# Patient Record
Sex: Female | Born: 1965 | Race: White | Hispanic: No | Marital: Married | Smoking: Current every day smoker
Health system: Southern US, Community
[De-identification: ages and names within clinical notes are randomized; demographics above are authoritative.]

## PROBLEM LIST (undated history)

## (undated) DIAGNOSIS — E119 Type 2 diabetes mellitus without complications: Secondary | ICD-10-CM

## (undated) DIAGNOSIS — D649 Anemia, unspecified: Secondary | ICD-10-CM

## (undated) DIAGNOSIS — Q644 Malformation of urachus: Secondary | ICD-10-CM

## (undated) DIAGNOSIS — E669 Obesity, unspecified: Secondary | ICD-10-CM

## (undated) DIAGNOSIS — M8580 Other specified disorders of bone density and structure, unspecified site: Secondary | ICD-10-CM

## (undated) DIAGNOSIS — G4730 Sleep apnea, unspecified: Secondary | ICD-10-CM

## (undated) DIAGNOSIS — K8020 Calculus of gallbladder without cholecystitis without obstruction: Secondary | ICD-10-CM

## (undated) DIAGNOSIS — I10 Essential (primary) hypertension: Secondary | ICD-10-CM

## (undated) DIAGNOSIS — N200 Calculus of kidney: Secondary | ICD-10-CM

## (undated) DIAGNOSIS — K219 Gastro-esophageal reflux disease without esophagitis: Secondary | ICD-10-CM

## (undated) DIAGNOSIS — F419 Anxiety disorder, unspecified: Secondary | ICD-10-CM

## (undated) DIAGNOSIS — E538 Deficiency of other specified B group vitamins: Secondary | ICD-10-CM

## (undated) HISTORY — DX: Calculus of kidney: N20.0

## (undated) HISTORY — DX: Malformation of urachus: Q64.4

## (undated) HISTORY — DX: Other specified disorders of bone density and structure, unspecified site: M85.80

## (undated) HISTORY — DX: Gastro-esophageal reflux disease without esophagitis: K21.9

## (undated) HISTORY — DX: Anemia, unspecified: D64.9

## (undated) HISTORY — DX: Sleep apnea, unspecified: G47.30

## (undated) HISTORY — DX: Type 2 diabetes mellitus without complications: E11.9

## (undated) HISTORY — DX: Essential (primary) hypertension: I10

## (undated) HISTORY — DX: Anxiety disorder, unspecified: F41.9

## (undated) HISTORY — DX: Deficiency of other specified B group vitamins: E53.8

## (undated) HISTORY — DX: Obesity, unspecified: E66.9

---
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
## 1990-09-09 HISTORY — PX: REDUCTION MAMMAPLASTY: SUR839

## 1999-04-23 ENCOUNTER — Ambulatory Visit (HOSPITAL_COMMUNITY): Admission: AD | Admit: 1999-04-23 | Discharge: 1999-04-23 | Payer: Self-pay | Admitting: *Deleted

## 1999-04-23 ENCOUNTER — Encounter (INDEPENDENT_AMBULATORY_CARE_PROVIDER_SITE_OTHER): Payer: Self-pay | Admitting: Specialist

## 1999-04-23 ENCOUNTER — Encounter: Payer: Self-pay | Admitting: *Deleted

## 1999-05-07 ENCOUNTER — Other Ambulatory Visit: Admission: RE | Admit: 1999-05-07 | Discharge: 1999-05-07 | Payer: Self-pay | Admitting: *Deleted

## 2000-05-10 DIAGNOSIS — Q644 Malformation of urachus: Secondary | ICD-10-CM

## 2000-05-10 HISTORY — DX: Malformation of urachus: Q64.4

## 2002-03-17 HISTORY — PX: GASTRIC BYPASS: SHX52

## 2002-06-17 ENCOUNTER — Encounter: Payer: Self-pay | Admitting: Internal Medicine

## 2003-05-03 ENCOUNTER — Encounter: Payer: Self-pay | Admitting: Internal Medicine

## 2003-05-03 ENCOUNTER — Other Ambulatory Visit: Admission: RE | Admit: 2003-05-03 | Discharge: 2003-05-03 | Payer: Self-pay | Admitting: Internal Medicine

## 2003-05-03 LAB — CONVERTED CEMR LAB: Pap Smear: NORMAL

## 2005-05-20 ENCOUNTER — Ambulatory Visit: Payer: Self-pay | Admitting: Internal Medicine

## 2005-05-24 ENCOUNTER — Ambulatory Visit: Payer: Self-pay | Admitting: Internal Medicine

## 2005-06-03 ENCOUNTER — Ambulatory Visit: Payer: Self-pay | Admitting: Internal Medicine

## 2005-06-05 ENCOUNTER — Ambulatory Visit: Payer: Self-pay | Admitting: Internal Medicine

## 2005-06-11 ENCOUNTER — Ambulatory Visit: Payer: Self-pay | Admitting: Internal Medicine

## 2005-06-21 ENCOUNTER — Ambulatory Visit: Payer: Self-pay | Admitting: Internal Medicine

## 2005-06-26 ENCOUNTER — Ambulatory Visit: Payer: Self-pay | Admitting: Family Medicine

## 2005-07-04 ENCOUNTER — Encounter: Payer: Self-pay | Admitting: Internal Medicine

## 2005-08-12 ENCOUNTER — Ambulatory Visit: Payer: Self-pay | Admitting: Internal Medicine

## 2005-11-18 ENCOUNTER — Ambulatory Visit: Payer: Self-pay | Admitting: Internal Medicine

## 2006-01-01 ENCOUNTER — Encounter: Payer: Self-pay | Admitting: Internal Medicine

## 2006-01-16 ENCOUNTER — Ambulatory Visit: Payer: Self-pay | Admitting: Internal Medicine

## 2006-03-20 ENCOUNTER — Ambulatory Visit: Payer: Self-pay | Admitting: Internal Medicine

## 2006-06-18 ENCOUNTER — Ambulatory Visit: Payer: Self-pay | Admitting: Internal Medicine

## 2006-12-19 ENCOUNTER — Encounter: Payer: Self-pay | Admitting: Internal Medicine

## 2006-12-19 ENCOUNTER — Ambulatory Visit: Payer: Self-pay | Admitting: Internal Medicine

## 2006-12-19 DIAGNOSIS — Z6827 Body mass index (BMI) 27.0-27.9, adult: Secondary | ICD-10-CM | POA: Insufficient documentation

## 2006-12-19 DIAGNOSIS — I10 Essential (primary) hypertension: Secondary | ICD-10-CM

## 2006-12-19 DIAGNOSIS — K219 Gastro-esophageal reflux disease without esophagitis: Secondary | ICD-10-CM

## 2006-12-19 DIAGNOSIS — E669 Obesity, unspecified: Secondary | ICD-10-CM | POA: Insufficient documentation

## 2006-12-19 DIAGNOSIS — J45909 Unspecified asthma, uncomplicated: Secondary | ICD-10-CM

## 2006-12-19 DIAGNOSIS — J4520 Mild intermittent asthma, uncomplicated: Secondary | ICD-10-CM | POA: Insufficient documentation

## 2006-12-19 DIAGNOSIS — D518 Other vitamin B12 deficiency anemias: Secondary | ICD-10-CM

## 2006-12-19 DIAGNOSIS — G2589 Other specified extrapyramidal and movement disorders: Secondary | ICD-10-CM | POA: Insufficient documentation

## 2006-12-19 DIAGNOSIS — E1159 Type 2 diabetes mellitus with other circulatory complications: Secondary | ICD-10-CM | POA: Insufficient documentation

## 2006-12-19 DIAGNOSIS — G4730 Sleep apnea, unspecified: Secondary | ICD-10-CM | POA: Insufficient documentation

## 2006-12-19 DIAGNOSIS — F411 Generalized anxiety disorder: Secondary | ICD-10-CM | POA: Insufficient documentation

## 2006-12-22 ENCOUNTER — Ambulatory Visit: Payer: Self-pay | Admitting: Internal Medicine

## 2006-12-22 LAB — CONVERTED CEMR LAB
Basophils Absolute: 0.1 10*3/uL (ref 0.0–0.1)
Basophils Relative: 1 % (ref 0.0–1.0)
Cholesterol: 193 mg/dL (ref 0–200)
Creatinine,U: 194.7 mg/dL
Eosinophils Absolute: 0.1 10*3/uL (ref 0.0–0.6)
Eosinophils Relative: 1.6 % (ref 0.0–5.0)
HCT: 36.9 % (ref 36.0–46.0)
HDL: 42.7 mg/dL (ref >39.0)
Hemoglobin: 12.4 g/dL (ref 12.0–15.0)
Hgb A1c MFr Bld: 6.1 %
Hgb A1c MFr Bld: 6.1 % — ABNORMAL HIGH (ref 4.6–6.0)
LDL Cholesterol: 130 mg/dL — ABNORMAL HIGH (ref 0–99)
Lymphocytes Relative: 24.2 % (ref 12.0–46.0)
MCHC: 33.7 g/dL (ref 30.0–36.0)
MCV: 84.6 fL (ref 78.0–100.0)
Microalb Creat Ratio: 16.9 mg/g (ref 0.0–30.0)
Microalb, Ur: 3.3 mg/dL — ABNORMAL HIGH (ref 0.0–1.9)
Monocytes Absolute: 0.4 10*3/uL (ref 0.2–0.7)
Monocytes Relative: 8.2 % (ref 3.0–11.0)
Neutro Abs: 3.5 10*3/uL (ref 1.4–7.7)
Neutrophils Relative %: 65 % (ref 43.0–77.0)
Platelets: 309 10*3/uL (ref 150–400)
RBC: 4.36 M/uL (ref 3.87–5.11)
RDW: 15.6 % — ABNORMAL HIGH (ref 11.5–14.6)
Total CHOL/HDL Ratio: 4.5
Triglycerides: 102 mg/dL (ref 0–149)
VLDL: 20 mg/dL (ref 0–40)
WBC: 5.4 10*3/uL (ref 4.5–10.5)

## 2007-06-02 ENCOUNTER — Encounter: Payer: Self-pay | Admitting: Internal Medicine

## 2007-06-02 DIAGNOSIS — R7303 Prediabetes: Secondary | ICD-10-CM | POA: Insufficient documentation

## 2007-06-02 DIAGNOSIS — E119 Type 2 diabetes mellitus without complications: Secondary | ICD-10-CM

## 2007-06-02 DIAGNOSIS — M8580 Other specified disorders of bone density and structure, unspecified site: Secondary | ICD-10-CM

## 2007-06-30 ENCOUNTER — Ambulatory Visit: Payer: Self-pay | Admitting: Internal Medicine

## 2007-07-01 LAB — CONVERTED CEMR LAB
Albumin: 3.8 g/dL (ref 3.5–5.2)
BUN: 9 mg/dL (ref 6–23)
Basophils Absolute: 0 10*3/uL (ref 0.0–0.1)
Basophils Relative: 0.6 % (ref 0.0–1.0)
CO2: 32 meq/L (ref 19–32)
Calcium: 8.9 mg/dL (ref 8.4–10.5)
Chloride: 102 meq/L (ref 96–112)
Creatinine, Ser: 0.6 mg/dL (ref 0.4–1.2)
Creatinine,U: 66.5 mg/dL
Eosinophils Absolute: 0.2 10*3/uL (ref 0.0–0.6)
Eosinophils Relative: 2.7 % (ref 0.0–5.0)
GFR calc Af Amer: 141 mL/min
GFR calc non Af Amer: 117 mL/min
Glucose, Bld: 89 mg/dL (ref 70–99)
HCT: 36.5 % (ref 36.0–46.0)
Hemoglobin: 12.2 g/dL (ref 12.0–15.0)
Hgb A1c MFr Bld: 6.5 % — ABNORMAL HIGH (ref 4.6–6.0)
Lymphocytes Relative: 35.8 % (ref 12.0–46.0)
MCHC: 33.5 g/dL (ref 30.0–36.0)
MCV: 86.8 fL (ref 78.0–100.0)
Microalb Creat Ratio: 36.1 mg/g — ABNORMAL HIGH (ref 0.0–30.0)
Microalb, Ur: 2.4 mg/dL — ABNORMAL HIGH (ref 0.0–1.9)
Monocytes Absolute: 0.5 10*3/uL (ref 0.2–0.7)
Monocytes Relative: 7.5 % (ref 3.0–11.0)
Neutro Abs: 3.3 10*3/uL (ref 1.4–7.7)
Neutrophils Relative %: 53.4 % (ref 43.0–77.0)
Phosphorus: 3.2 mg/dL (ref 2.3–4.6)
Platelets: 371 10*3/uL (ref 150–400)
Potassium: 3.6 meq/L (ref 3.5–5.1)
RBC: 4.2 M/uL (ref 3.87–5.11)
RDW: 15.1 % — ABNORMAL HIGH (ref 11.5–14.6)
Sodium: 141 meq/L (ref 135–145)
TSH: 2.65 microintl units/mL (ref 0.35–5.50)
WBC: 6.3 10*3/uL (ref 4.5–10.5)

## 2007-08-10 ENCOUNTER — Ambulatory Visit: Payer: Self-pay | Admitting: Internal Medicine

## 2007-08-10 LAB — CONVERTED CEMR LAB
Albumin: 3.9 g/dL (ref 3.5–5.2)
BUN: 10 mg/dL (ref 6–23)
CO2: 28 meq/L (ref 19–32)
Calcium: 9.5 mg/dL (ref 8.4–10.5)
Chloride: 106 meq/L (ref 96–112)
Creatinine, Ser: 0.6 mg/dL (ref 0.4–1.2)
GFR calc Af Amer: 141 mL/min
GFR calc non Af Amer: 117 mL/min
Glucose, Bld: 88 mg/dL (ref 70–99)
Phosphorus: 3.9 mg/dL (ref 2.3–4.6)
Potassium: 4.6 meq/L (ref 3.5–5.1)
Sodium: 141 meq/L (ref 135–145)

## 2007-11-23 ENCOUNTER — Telehealth (INDEPENDENT_AMBULATORY_CARE_PROVIDER_SITE_OTHER): Payer: Self-pay | Admitting: *Deleted

## 2007-12-09 LAB — CONVERTED CEMR LAB
Pap Smear: NORMAL
Pap Smear: NORMAL
Pap Smear: NORMAL
Pap Smear: NORMAL

## 2007-12-09 LAB — HM PAP SMEAR

## 2008-02-08 ENCOUNTER — Ambulatory Visit: Payer: Self-pay | Admitting: Family Medicine

## 2008-03-09 ENCOUNTER — Telehealth: Payer: Self-pay | Admitting: Family Medicine

## 2008-03-10 ENCOUNTER — Telehealth (INDEPENDENT_AMBULATORY_CARE_PROVIDER_SITE_OTHER): Payer: Self-pay | Admitting: *Deleted

## 2008-04-21 ENCOUNTER — Ambulatory Visit: Payer: Self-pay | Admitting: Family Medicine

## 2008-04-21 DIAGNOSIS — N3000 Acute cystitis without hematuria: Secondary | ICD-10-CM

## 2008-04-21 LAB — CONVERTED CEMR LAB
Bilirubin Urine: NEGATIVE
Glucose, Urine, Semiquant: NEGATIVE
Ketones, urine, test strip: NEGATIVE
Nitrite: NEGATIVE
Protein, U semiquant: NEGATIVE
Specific Gravity, Urine: 1.015
Urobilinogen, UA: 0.2
WBC Urine, dipstick: NEGATIVE
pH: 6

## 2008-05-18 ENCOUNTER — Telehealth: Payer: Self-pay | Admitting: Family Medicine

## 2008-06-17 ENCOUNTER — Telehealth: Payer: Self-pay | Admitting: Family Medicine

## 2008-07-26 ENCOUNTER — Telehealth: Payer: Self-pay | Admitting: Family Medicine

## 2008-08-08 ENCOUNTER — Ambulatory Visit: Payer: Self-pay | Admitting: Family Medicine

## 2008-08-11 ENCOUNTER — Ambulatory Visit: Payer: Self-pay | Admitting: Family Medicine

## 2008-08-11 DIAGNOSIS — E7800 Pure hypercholesterolemia, unspecified: Secondary | ICD-10-CM

## 2008-08-11 DIAGNOSIS — D509 Iron deficiency anemia, unspecified: Secondary | ICD-10-CM

## 2008-08-11 LAB — CONVERTED CEMR LAB
ALT: 25 units/L (ref 0–35)
AST: 19 units/L (ref 0–37)
Albumin: 3.7 g/dL (ref 3.5–5.2)
Alkaline Phosphatase: 84 units/L (ref 39–117)
BUN: 14 mg/dL (ref 6–23)
Basophils Absolute: 0 10*3/uL (ref 0.0–0.1)
Basophils Relative: 0.7 % (ref 0.0–3.0)
Bilirubin, Direct: 0.1 mg/dL (ref 0.0–0.3)
CO2: 30 meq/L (ref 19–32)
Calcium: 9.1 mg/dL (ref 8.4–10.5)
Chloride: 107 meq/L (ref 96–112)
Cholesterol: 206 mg/dL (ref 0–200)
Creatinine, Ser: 0.7 mg/dL (ref 0.4–1.2)
Direct LDL: 119 mg/dL
Eosinophils Absolute: 0.2 10*3/uL (ref 0.0–0.7)
Eosinophils Relative: 3.7 % (ref 0.0–5.0)
Folate: 5.7 ng/mL
GFR calc Af Amer: 117 mL/min
GFR calc non Af Amer: 97 mL/min
Glucose, Bld: 113 mg/dL — ABNORMAL HIGH (ref 70–99)
HCT: 29.2 % — ABNORMAL LOW (ref 36.0–46.0)
HDL: 39.4 mg/dL (ref >39.0)
Hemoglobin: 9.6 g/dL — ABNORMAL LOW (ref 12.0–15.0)
Hgb A1c MFr Bld: 7.3 % — ABNORMAL HIGH (ref 4.6–6.0)
Lymphocytes Relative: 28.2 % (ref 12.0–46.0)
MCHC: 32.9 g/dL (ref 30.0–36.0)
MCV: 74.5 fL — ABNORMAL LOW (ref 78.0–100.0)
Monocytes Absolute: 0.4 10*3/uL (ref 0.1–1.0)
Monocytes Relative: 6.9 % (ref 3.0–12.0)
Neutro Abs: 3.2 10*3/uL (ref 1.4–7.7)
Neutrophils Relative %: 60.5 % (ref 43.0–77.0)
Platelets: 361 10*3/uL (ref 150–400)
Potassium: 4.3 meq/L (ref 3.5–5.1)
RBC: 3.92 M/uL (ref 3.87–5.11)
RDW: 16.1 % — ABNORMAL HIGH (ref 11.5–14.6)
Sodium: 142 meq/L (ref 135–145)
Total Bilirubin: 0.4 mg/dL (ref 0.3–1.2)
Total CHOL/HDL Ratio: 5.2
Total Protein: 7.6 g/dL (ref 6.0–8.3)
Triglycerides: 168 mg/dL — ABNORMAL HIGH (ref 0–149)
VLDL: 34 mg/dL (ref 0–40)
Vitamin B-12: 157 pg/mL — ABNORMAL LOW (ref 211–911)
WBC: 5.3 10*3/uL (ref 4.5–10.5)

## 2008-09-12 ENCOUNTER — Telehealth: Payer: Self-pay | Admitting: Family Medicine

## 2008-10-24 ENCOUNTER — Telehealth: Payer: Self-pay | Admitting: Family Medicine

## 2008-11-02 ENCOUNTER — Encounter (INDEPENDENT_AMBULATORY_CARE_PROVIDER_SITE_OTHER): Payer: Self-pay | Admitting: *Deleted

## 2008-11-23 ENCOUNTER — Telehealth: Payer: Self-pay | Admitting: Family Medicine

## 2011-01-12 ENCOUNTER — Encounter: Payer: Self-pay | Admitting: Family Medicine

## 2011-01-18 ENCOUNTER — Encounter: Payer: Self-pay | Admitting: Family Medicine

## 2011-01-18 ENCOUNTER — Ambulatory Visit (INDEPENDENT_AMBULATORY_CARE_PROVIDER_SITE_OTHER): Payer: BC Managed Care – PPO | Admitting: Family Medicine

## 2011-01-18 VITALS — BP 120/78 | HR 80 | Temp 97.6°F | Ht 60.0 in | Wt 131.8 lb

## 2011-01-18 DIAGNOSIS — E538 Deficiency of other specified B group vitamins: Secondary | ICD-10-CM

## 2011-01-18 DIAGNOSIS — D518 Other vitamin B12 deficiency anemias: Secondary | ICD-10-CM

## 2011-01-18 DIAGNOSIS — E119 Type 2 diabetes mellitus without complications: Secondary | ICD-10-CM

## 2011-01-18 DIAGNOSIS — G2589 Other specified extrapyramidal and movement disorders: Secondary | ICD-10-CM

## 2011-01-18 DIAGNOSIS — E7800 Pure hypercholesterolemia, unspecified: Secondary | ICD-10-CM

## 2011-01-18 DIAGNOSIS — I10 Essential (primary) hypertension: Secondary | ICD-10-CM

## 2011-01-18 MED ORDER — CYANOCOBALAMIN 1000 MCG/ML IJ SOLN
1000.0000 ug | Freq: Once | INTRAMUSCULAR | Status: AC
  Administered 2011-01-18: 1000 ug via INTRAMUSCULAR

## 2011-01-18 MED ORDER — CLONAZEPAM 0.5 MG PO TABS: 0.5000 mg | ORAL_TABLET | Freq: Every day | ORAL | Status: DC

## 2011-01-18 MED ORDER — TRIAMCINOLONE ACETONIDE 75 MCG/ACT IN AERS: 2.0000 | INHALATION_SPRAY | Freq: Two times a day (BID) | RESPIRATORY_TRACT | Status: DC

## 2011-01-18 MED ORDER — ALBUTEROL 90 MCG/ACT IN AERS: 2.0000 | INHALATION_SPRAY | Freq: Four times a day (QID) | RESPIRATORY_TRACT | Status: DC | PRN

## 2011-01-18 NOTE — Progress Notes (Signed)
Subjective:    Patient ID: Brenda Hawkins, female    DOB: October 23, 1965, 45 y.o.   MRN: [id number]  HPI  Has not been seen since 2009. She comes to clinic today  requesting refills of her medication. Going out of town Sunday for a week.  DM, FBS 60. Checking every few weeks. No hyperglycemia. Has lost 30-40 lbs in last few years. She states she has not been trying to lose weigh .  Very fatigued  NO exercise.  Moderate eating habits...not eating healthy foods currently because trying not to lose to much more weight.  Hx of smoking 2 ppd for 4 years.  HTN, well controlled  On lisinopril  No chest pain, NO SOB, no edema.   High cholesterol Over due for labs.   B12 deficiency...she feels this is the cause of her fatigue.  Using clonazepam for restless leg... Bedtime  Asthma, moderate: Using albuterol only with trigger like perfume every few months Taking asthmacort daily    Has not been seeing a GYN/OB. PLans to ge see given ? Perimenopause. Does not want to do exam here. Moody around time when she would have her mood.   Review of Systems  Constitutional: Positive for fatigue and unexpected weight change. Negative for fever.  HENT:       Feels like cotton in ears No pain.  Eyes: Negative for pain.  Respiratory: Negative for cough, shortness of breath and wheezing.   Cardiovascular: Negative for chest pain, palpitations and leg swelling.  Gastrointestinal: Negative for abdominal pain, diarrhea, constipation and blood in stool.  Genitourinary: Negative for dysuria, hematuria, flank pain, vaginal bleeding, vaginal discharge and vaginal pain.  Musculoskeletal:       Restless leg syndrome  Psychiatric/Behavioral: Negative for confusion, dysphoric mood and agitation.       Objective:   Physical Exam  Constitutional: Vital signs are normal. She appears well-developed and well-nourished. She is cooperative.  Non-toxic appearance. She does not appear ill. No distress.  HENT:    Head: Normocephalic.  Right Ear: Hearing, tympanic membrane, external ear and ear canal normal. Tympanic membrane is not erythematous, not retracted and not bulging.  Left Ear: Hearing, tympanic membrane, external ear and ear canal normal. Tympanic membrane is not erythematous, not retracted and not bulging.  Nose: No mucosal edema or rhinorrhea. Right sinus exhibits no maxillary sinus tenderness and no frontal sinus tenderness. Left sinus exhibits no maxillary sinus tenderness and no frontal sinus tenderness.  Mouth/Throat: Uvula is midline, oropharynx is clear and moist and mucous membranes are normal.  Eyes: Conjunctivae, EOM and lids are normal. Pupils are equal, round, and reactive to light. No foreign bodies found.  Neck: Trachea normal and normal range of motion. Neck supple. Carotid bruit is not present. No mass and no thyromegaly present.  Cardiovascular: Normal rate, regular rhythm, S1 normal, S2 normal, normal heart sounds, intact distal pulses and normal pulses.  Exam reveals no gallop and no friction rub.   No murmur heard. Pulmonary/Chest: Effort normal and breath sounds normal. Not tachypneic. No respiratory distress. She has no decreased breath sounds. She has no wheezes. She has no rhonchi. She has no rales.  Abdominal: Soft. Normal appearance and bowel sounds are normal. There is no tenderness.  Neurological: She is alert.  Skin: Skin is warm, dry and intact. No rash noted.  Psychiatric: Her speech is normal and behavior is normal. Judgment and thought content normal. Her mood appears not anxious. Cognition and memory are normal. She does not exhibit  a depressed mood.        Diabetic foot exam: Normal inspection No skin breakdown No calluses  Normal DP pulses Normal sensation to light tough and monofilament Nails normal   Assessment & Plan:

## 2011-01-18 NOTE — Patient Instructions (Addendum)
Dr. Marice Potter or Penne Lash, [facility]...for GYN if interested. Try zyrtec for fluid in ears likely caused by allergies. Eat healthy diet.. 3 meals a day with snacks in between.

## 2011-02-03 NOTE — Assessment & Plan Note (Signed)
Replete B12 °

## 2011-02-03 NOTE — Assessment & Plan Note (Signed)
Well controlled. Continue current medication.  

## 2011-02-03 NOTE — Assessment & Plan Note (Signed)
Previously moderately well controlled.Due to for reeval.  Lab Results  Component Value Date   HGBA1C 7.3* 08/08/2008

## 2011-02-03 NOTE — Assessment & Plan Note (Signed)
Refill clonazepam

## 2011-02-03 NOTE — Assessment & Plan Note (Signed)
Due for re-eval. Encouraged exercise, weight loss, healthy eating habits.  

## 2011-02-14 ENCOUNTER — Other Ambulatory Visit (INDEPENDENT_AMBULATORY_CARE_PROVIDER_SITE_OTHER): Payer: BC Managed Care – PPO | Admitting: Family Medicine

## 2011-02-14 DIAGNOSIS — E7800 Pure hypercholesterolemia, unspecified: Secondary | ICD-10-CM

## 2011-02-14 DIAGNOSIS — E119 Type 2 diabetes mellitus without complications: Secondary | ICD-10-CM

## 2011-02-14 DIAGNOSIS — E538 Deficiency of other specified B group vitamins: Secondary | ICD-10-CM

## 2011-02-15 LAB — CBC WITH DIFFERENTIAL/PLATELET
Basophils Absolute: 0.1 10*3/uL (ref 0.0–0.1)
Basophils Relative: 1 % (ref 0–1)
Eosinophils Absolute: 0.2 10*3/uL (ref 0.0–0.7)
Eosinophils Relative: 3 % (ref 0–5)
HCT: 30.1 % — ABNORMAL LOW (ref 36.0–46.0)
MCH: 20 pg — ABNORMAL LOW (ref 26.0–34.0)
MCHC: 27.9 g/dL — ABNORMAL LOW (ref 30.0–36.0)
MCV: 71.8 fL — ABNORMAL LOW (ref 78.0–100.0)
Monocytes Absolute: 0.5 10*3/uL (ref 0.1–1.0)
Platelets: 415 10*3/uL — ABNORMAL HIGH (ref 150–400)
RDW: 20.1 % — ABNORMAL HIGH (ref 11.5–15.5)

## 2011-02-15 LAB — HEPATIC FUNCTION PANEL
ALT: 15 U/L (ref 0–35)
AST: 17 U/L (ref 0–37)
Alkaline Phosphatase: 54 U/L (ref 39–117)
Bilirubin, Direct: <0.1 mg/dL (ref 0.0–0.3)
Total Bilirubin: 0.2 mg/dL — ABNORMAL LOW (ref 0.3–1.2)

## 2011-02-15 LAB — BASIC METABOLIC PANEL
Chloride: 105 mEq/L (ref 96–112)
Potassium: 4.1 mEq/L (ref 3.5–5.3)
Sodium: 140 mEq/L (ref 135–145)

## 2011-02-15 LAB — LIPID PANEL
HDL: 45 mg/dL (ref >39)
LDL Cholesterol: 95 mg/dL (ref 0–99)

## 2011-02-15 LAB — VITAMIN B12: Vitamin B-12: >2000 pg/mL — ABNORMAL HIGH (ref 211–911)

## 2011-02-15 LAB — TSH: TSH: 1.121 u[IU]/mL (ref 0.350–4.500)

## 2011-02-18 ENCOUNTER — Encounter: Payer: Self-pay | Admitting: Family Medicine

## 2011-02-18 ENCOUNTER — Ambulatory Visit (INDEPENDENT_AMBULATORY_CARE_PROVIDER_SITE_OTHER): Payer: BC Managed Care – PPO | Admitting: Family Medicine

## 2011-02-18 DIAGNOSIS — D649 Anemia, unspecified: Secondary | ICD-10-CM

## 2011-02-18 DIAGNOSIS — E119 Type 2 diabetes mellitus without complications: Secondary | ICD-10-CM

## 2011-02-18 DIAGNOSIS — D509 Iron deficiency anemia, unspecified: Secondary | ICD-10-CM

## 2011-02-18 DIAGNOSIS — H6980 Other specified disorders of Eustachian tube, unspecified ear: Secondary | ICD-10-CM | POA: Insufficient documentation

## 2011-02-18 DIAGNOSIS — I10 Essential (primary) hypertension: Secondary | ICD-10-CM

## 2011-02-18 DIAGNOSIS — J45909 Unspecified asthma, uncomplicated: Secondary | ICD-10-CM

## 2011-02-18 DIAGNOSIS — E7800 Pure hypercholesterolemia, unspecified: Secondary | ICD-10-CM

## 2011-02-18 DIAGNOSIS — G2589 Other specified extrapyramidal and movement disorders: Secondary | ICD-10-CM

## 2011-02-18 DIAGNOSIS — H6990 Unspecified Eustachian tube disorder, unspecified ear: Secondary | ICD-10-CM | POA: Insufficient documentation

## 2011-02-18 LAB — HM DIABETES FOOT EXAM

## 2011-02-18 MED ORDER — FERROUS SULFATE 325 (65 FE) MG PO TABS: 325.0000 mg | ORAL_TABLET | Freq: Every day | ORAL | Status: DC

## 2011-02-18 MED ORDER — FLUTICASONE PROPIONATE HFA 44 MCG/ACT IN AERO: 1.0000 | INHALATION_SPRAY | Freq: Two times a day (BID) | RESPIRATORY_TRACT | Status: DC

## 2011-02-18 MED ORDER — FLUTICASONE PROPIONATE 50 MCG/ACT NA SUSP: 2.0000 | Freq: Every day | NASAL | Status: DC

## 2011-02-18 MED ORDER — ROPINIROLE HCL 1 MG PO TABS: ORAL_TABLET | ORAL | Status: DC

## 2011-02-18 NOTE — Assessment & Plan Note (Signed)
Trial of generic requip in place of clonazepam.

## 2011-02-18 NOTE — Assessment & Plan Note (Signed)
Very well controlled with weight loss. On no medication.  Encouraged exercise, weight loss, healthy eating habits.

## 2011-02-18 NOTE — Assessment & Plan Note (Signed)
Continue Zyrtec. Trial of fluticasone nasal.  Call if not improving in 2-3 weeks.

## 2011-02-18 NOTE — Patient Instructions (Addendum)
Make appt with nurse for B12 injection every 3-6 months. Start iron tab twice daily. Call if fatigue not improving, or continued unintended weight loss. Follow BP at home , call if consistently high, >130/80. Start nasal spray.  Make an appointment with gynecologist

## 2011-02-18 NOTE — Assessment & Plan Note (Signed)
Her B12 is in nml range with injection last month. MCV suggests iron deficiency. Likely cause of fatigue.  pt refuses labs for iron or repeat hg today.  She will start iron tablets BID. Recheck in 6 months.

## 2011-02-18 NOTE — Assessment & Plan Note (Addendum)
Well controlled. On no medication.  

## 2011-02-18 NOTE — Assessment & Plan Note (Signed)
?   Control. Follow at home. Call if above goal 130/80 consistently.

## 2011-02-18 NOTE — Progress Notes (Signed)
Subjective:    Patient ID: Brenda Hawkins, female    DOB: 1965-12-13, 45 y.o.   MRN: [id number]  HPI Plans to see GYN for yearly pap and breast exam.  Diabetes:  Well controlled Lab Results  Component Value Date   HGBA1C 5.6 02/14/2011  Much BETTER on no medications, just lifestyle. Getting regular exercise and eating healthy foods. She did  Hypoglycemic episodes:None Hyperglycemic episodes:None Feet problems:None Blood Sugars averaging:Not checking eye exam within last year: YEs  Hypertension:  Today not at goal <130/80 Using medication without problems or lightheadedness:  Chest pain with exertion: None Edema:None Short of breath:None Average home BPs:139/80s   Anemia.. Hg 8.4 she has a strong history of this... From B12 def and iron def.  She got her B12 shot 1 month ago. She feels she is getting adequate.  Continued fatigue. No further weight loss. She refuses checking iron levels because it is   Clonazepam working well for her for restless legs, but she has never used ropinirole. She is interested in a medcine that is not a controlled substance.  Roaring in ears, ongoing for 3 months Sounds like everyone through tunnel. Gets worse later in day. Occ lightheaded if leaning over.  Occ left sided shooting ear pain.  No heartbeat sound. Runny nose constantly. Some sneeze. Occ headache in frontal region. Zyrtec has not helped any. Has not tried a nasal spray  Needs different medicine for asthmacort.. No longer made.   Elevated Cholesterol: At goal < 70 on no medication. LDL 96  PMH and SH reviewed.     Review of Systems  Constitutional: Positive for fatigue and unexpected weight change. Negative for fever.  HENT: Positive for ear pain, congestion, rhinorrhea, postnasal drip and tinnitus.   Eyes: Negative for pain.  Respiratory: Negative for chest tightness and shortness of breath.   Cardiovascular: Negative for chest pain, palpitations and leg swelling.    Gastrointestinal: Negative for abdominal pain.  Genitourinary: Negative for dysuria.       Objective:   Physical Exam  Constitutional: Vital signs are normal. She appears well-developed and well-nourished. She is cooperative.  Non-toxic appearance. She does not appear ill. No distress.  HENT:  Head: Normocephalic.  Right Ear: Hearing, external ear and ear canal normal. A middle ear effusion is present.  Left Ear: Hearing, external ear and ear canal normal. A middle ear effusion is present.  Nose: Nose normal.  Eyes: Conjunctivae, EOM and lids are normal. Pupils are equal, round, and reactive to light. No foreign bodies found.  Neck: Trachea normal and normal range of motion. Neck supple. Carotid bruit is not present. No mass and no thyromegaly present.  Cardiovascular: Normal rate, regular rhythm, S1 normal, S2 normal, normal heart sounds and intact distal pulses.  Exam reveals no gallop.   No murmur heard. Pulmonary/Chest: Effort normal and breath sounds normal. No respiratory distress. She has no wheezes. She has no rhonchi. She has no rales.  Abdominal: Soft. Normal appearance and bowel sounds are normal. She exhibits no distension, no fluid wave, no abdominal bruit and no mass. There is no hepatosplenomegaly. There is no tenderness. There is no rebound, no guarding and no CVA tenderness. No hernia.  Genitourinary: Pelvic exam was performed with patient prone.  Lymphadenopathy:    She has no cervical adenopathy.    She has no axillary adenopathy.  Neurological: She is alert. She has normal strength. No cranial nerve deficit or sensory deficit.  Skin: Skin is warm, dry and intact. No  rash noted.  Psychiatric: Her speech is normal and behavior is normal. Judgment normal. Her mood appears not anxious. Cognition and memory are normal. She does not exhibit a depressed mood.   Diabetic foot exam: Normal inspection No skin breakdown No calluses  Normal DP pulses Normal sensation to  light tough and monofilament Nails normal         Assessment & Plan:

## 2011-02-18 NOTE — Assessment & Plan Note (Signed)
Change to low dose flovent HFA in place of asthmacort since this is no longer made.

## 2011-02-22 ENCOUNTER — Encounter: Payer: Self-pay | Admitting: Family Medicine

## 2011-02-22 ENCOUNTER — Ambulatory Visit (INDEPENDENT_AMBULATORY_CARE_PROVIDER_SITE_OTHER): Payer: BC Managed Care – PPO | Admitting: Family Medicine

## 2011-02-22 VITALS — BP 160/80 | HR 76 | Temp 98.2°F | Wt 128.0 lb

## 2011-02-22 DIAGNOSIS — D509 Iron deficiency anemia, unspecified: Secondary | ICD-10-CM

## 2011-02-22 DIAGNOSIS — D518 Other vitamin B12 deficiency anemias: Secondary | ICD-10-CM

## 2011-02-22 DIAGNOSIS — J45909 Unspecified asthma, uncomplicated: Secondary | ICD-10-CM

## 2011-02-22 DIAGNOSIS — H6980 Other specified disorders of Eustachian tube, unspecified ear: Secondary | ICD-10-CM

## 2011-02-22 DIAGNOSIS — H6990 Unspecified Eustachian tube disorder, unspecified ear: Secondary | ICD-10-CM

## 2011-02-22 DIAGNOSIS — G2589 Other specified extrapyramidal and movement disorders: Secondary | ICD-10-CM

## 2011-02-22 NOTE — Assessment & Plan Note (Addendum)
>  25 min spent with face to face with patient >50% counseling re: RLS/fatigue/low iron.  Path/phys d/w pt and no change in PMD's plan.

## 2011-02-22 NOTE — Progress Notes (Signed)
Fatigue.  Anemia noted.  Started on iron this past week after eval by PMD.  No CP, not SOB.  H/o iron and B12 def, gastric bypass. No bleeding/bruising.    Ringing in the ears, B, and dec in hearing.  Prev PMD note reviewed.  Recently started on tx for [facility].    Asthma.  Change in breathing worse over last few days but had been gradually getting worse.   Leg movements- worse in last 2-3 months.  Again, h/o iron def anemia.   PMH and SH reviewed  ROS: See HPI, otherwise noncontributory.  Meds, vitals, and allergies reviewed.   nad ncat Tm wnl B SOM noted but no erythema Mmm Neck supple rrr Ctab, no wheeze Ext well perfused

## 2011-02-22 NOTE — Patient Instructions (Signed)
I would continue with the iron and all of the other meds per Dr. Ermalene Searing.  If you continue to have trouble with hearing in 2 weeks, let her know.  If you continue to have fatigue after that, then let her know, too.  I think this will gradually improve as your iron/blood levels come up.  Take care.

## 2011-02-24 ENCOUNTER — Encounter: Payer: Self-pay | Admitting: Family Medicine

## 2011-02-24 NOTE — Assessment & Plan Note (Signed)
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  She understood.

## 2011-02-24 NOTE — Assessment & Plan Note (Signed)
Continue current tx and no change in PMD's plan.  F/u if sx persist.

## 2011-02-24 NOTE — Assessment & Plan Note (Signed)
Likely RLS exacerbation related to low iron.  D/w pt about all of this.  She hasn't had time to get iron levels up.  Treat with iron and no change in PMD's plan.

## 2011-02-24 NOTE — Assessment & Plan Note (Signed)
No wheeze and 98% on RA.  Continue current meds and no change in PMD's plan.

## 2011-05-21 ENCOUNTER — Ambulatory Visit (INDEPENDENT_AMBULATORY_CARE_PROVIDER_SITE_OTHER): Payer: BC Managed Care – PPO | Admitting: *Deleted

## 2011-05-21 DIAGNOSIS — E538 Deficiency of other specified B group vitamins: Secondary | ICD-10-CM

## 2011-05-21 MED ORDER — CYANOCOBALAMIN 1000 MCG/ML IJ SOLN
1000.0000 ug | Freq: Once | INTRAMUSCULAR | Status: AC
  Administered 2011-05-21: 1000 ug via INTRAMUSCULAR

## 2011-05-23 ENCOUNTER — Ambulatory Visit: Payer: BC Managed Care – PPO

## 2011-08-15 ENCOUNTER — Ambulatory Visit: Payer: BC Managed Care – PPO

## 2011-08-15 ENCOUNTER — Other Ambulatory Visit (INDEPENDENT_AMBULATORY_CARE_PROVIDER_SITE_OTHER): Payer: BC Managed Care – PPO

## 2011-08-15 ENCOUNTER — Ambulatory Visit (INDEPENDENT_AMBULATORY_CARE_PROVIDER_SITE_OTHER): Payer: BC Managed Care – PPO | Admitting: *Deleted

## 2011-08-15 DIAGNOSIS — D518 Other vitamin B12 deficiency anemias: Secondary | ICD-10-CM

## 2011-08-15 DIAGNOSIS — E119 Type 2 diabetes mellitus without complications: Secondary | ICD-10-CM

## 2011-08-15 DIAGNOSIS — D649 Anemia, unspecified: Secondary | ICD-10-CM

## 2011-08-15 LAB — CBC WITH DIFFERENTIAL/PLATELET
Basophils Relative: 0.6 % (ref 0.0–3.0)
Eosinophils Absolute: 0.1 10*3/uL (ref 0.0–0.7)
HCT: 42.6 % (ref 36.0–46.0)
Lymphocytes Relative: 24 % (ref 12.0–46.0)
Monocytes Absolute: 0.4 10*3/uL (ref 0.1–1.0)
Neutro Abs: 3.6 10*3/uL (ref 1.4–7.7)
Neutrophils Relative %: 65.9 % (ref 43.0–77.0)
Platelets: 275 10*3/uL (ref 150.0–400.0)
RDW: 15.7 % — ABNORMAL HIGH (ref 11.5–14.6)
WBC: 5.5 10*3/uL (ref 4.5–10.5)

## 2011-08-15 LAB — COMPREHENSIVE METABOLIC PANEL
ALT: 17 U/L (ref 0–35)
CO2: 24 mEq/L (ref 19–32)
Calcium: 8.9 mg/dL (ref 8.4–10.5)
Chloride: 105 mEq/L (ref 96–112)
GFR: 89.5 mL/min (ref >60.00)
Glucose, Bld: 86 mg/dL (ref 70–99)
Sodium: 138 mEq/L (ref 135–145)
Total Bilirubin: 0.2 mg/dL — ABNORMAL LOW (ref 0.3–1.2)
Total Protein: 7.5 g/dL (ref 6.0–8.3)

## 2011-08-15 LAB — LIPID PANEL
HDL: 49.3 mg/dL (ref >39.00)
Triglycerides: 121 mg/dL (ref 0.0–149.0)

## 2011-08-15 MED ORDER — CYANOCOBALAMIN 1000 MCG/ML IJ SOLN
1000.0000 ug | Freq: Once | INTRAMUSCULAR | Status: AC
  Administered 2011-08-15: 1000 ug via INTRAMUSCULAR

## 2011-08-15 NOTE — Progress Notes (Signed)
Addended by: Baldomero Lamy on: 08/15/2011 08:40 AM   Modules accepted: Orders

## 2011-08-22 ENCOUNTER — Ambulatory Visit (INDEPENDENT_AMBULATORY_CARE_PROVIDER_SITE_OTHER): Payer: BC Managed Care – PPO | Admitting: Family Medicine

## 2011-08-22 ENCOUNTER — Encounter: Payer: Self-pay | Admitting: Family Medicine

## 2011-08-22 DIAGNOSIS — D518 Other vitamin B12 deficiency anemias: Secondary | ICD-10-CM

## 2011-08-22 DIAGNOSIS — E119 Type 2 diabetes mellitus without complications: Secondary | ICD-10-CM

## 2011-08-22 DIAGNOSIS — D509 Iron deficiency anemia, unspecified: Secondary | ICD-10-CM

## 2011-08-22 DIAGNOSIS — E7800 Pure hypercholesterolemia, unspecified: Secondary | ICD-10-CM

## 2011-08-22 DIAGNOSIS — I10 Essential (primary) hypertension: Secondary | ICD-10-CM

## 2011-08-22 LAB — HM DIABETES FOOT EXAM

## 2011-08-22 NOTE — Patient Instructions (Addendum)
Get back on track with diet and exercise.. Will check cholesterol in next 6 months. Trial of increase of iron to three if tolerated. Follow BP at home.. Goal less than 140/90. Consider medication to treat blood pressure.  Follow up in 6 months with labs prior for CPX.

## 2011-08-22 NOTE — Assessment & Plan Note (Signed)
Try to increase iron. Offered heme referral for iron infusion consideration.. Pt not interested.

## 2011-08-22 NOTE — Assessment & Plan Note (Signed)
Inadequate control...will work on diet control and lifestyle aggressively. Recheck in 6 months. Very hesitant about med initiation.

## 2011-08-22 NOTE — Assessment & Plan Note (Signed)
Well controlled with diet. 

## 2011-08-22 NOTE — Assessment & Plan Note (Signed)
On B12 injections 

## 2011-08-22 NOTE — Assessment & Plan Note (Signed)
Poor control.. Refuses med. Will work on exercise.  Discussed risk and fact that likely hereditary issue. Likely will need med in future.  Follow Bps at home.

## 2011-08-22 NOTE — Progress Notes (Signed)
  Subjective:    Patient ID: Brenda Hawkins, female    DOB: 1966-03-16, 45 y.o.   MRN: [id number]  HPI  Diabetes:  Diet controlled. Lab Results  Component Value Date   HGBA1C 6.1 08/15/2011  Using medications without difficulties:On none Hypoglycemic episodes:? Hyperglycemic episodes:? Feet problems:None Blood Sugars averaging:Not checking eye exam within last year: oiver 1 year.  Elevated Cholesterol: LDL not at goal <100 ( currently at 145) on no medication 6 mo nths ago she was doing erll with lifestyle.Marland Kitchen LDL was at 95. Diet compliance: Moderate.. Some issue over holidays. Exercise: none.. Feels limited due to poor energy Other complaints:  Hypertension:  EWhite coat HTN on no medication Using medication without problems or lightheadedness:  Chest pain with exertion: None Edema:None Short of breath:None Average home BPs: at home 140/80s Other issues: overall fatigue.  Anemia, iron def: Hg back  in nml range (was low in 8s in June) but iron remaining low.   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 Current on iron 325 mg BID... Taking stool softner along with it. Last b12 injection 1 week ago... Getting every 3 months.   Review of Systems  Constitutional: Negative for fever and fatigue.  HENT: Negative for ear pain.   Eyes: Negative for pain.  Respiratory: Negative for chest tightness and shortness of breath.   Cardiovascular: Negative for chest pain, palpitations and leg swelling.  Gastrointestinal: Negative for abdominal pain.  Genitourinary: Negative for dysuria.       Objective:   Physical Exam  Constitutional: Vital signs are normal. She appears well-developed and well-nourished. She is cooperative.  Non-toxic appearance. She does not appear ill. No distress.  HENT:  Head: Normocephalic.  Right Ear: Hearing, tympanic membrane, external ear and ear canal normal. Tympanic membrane is not erythematous, not retracted and not bulging.  Left Ear: Hearing,  tympanic membrane, external ear and ear canal normal. Tympanic membrane is not erythematous, not retracted and not bulging.  Nose: No mucosal edema or rhinorrhea. Right sinus exhibits no maxillary sinus tenderness and no frontal sinus tenderness. Left sinus exhibits no maxillary sinus tenderness and no frontal sinus tenderness.  Mouth/Throat: Uvula is midline, oropharynx is clear and moist and mucous membranes are normal.  Eyes: Conjunctivae, EOM and lids are normal. Pupils are equal, round, and reactive to light. No foreign bodies found.  Neck: Trachea normal and normal range of motion. Neck supple. Carotid bruit is not present. No mass and no thyromegaly present.  Cardiovascular: Normal rate, regular rhythm, S1 normal, S2 normal, normal heart sounds, intact distal pulses and normal pulses.  Exam reveals no gallop and no friction rub.   No murmur heard. Pulmonary/Chest: Effort normal and breath sounds normal. Not tachypneic. No respiratory distress. She has no decreased breath sounds. She has no wheezes. She has no rhonchi. She has no rales.  Abdominal: Soft. Normal appearance and bowel sounds are normal. There is no tenderness.  Neurological: She is alert.  Skin: Skin is warm, dry and intact. No rash noted.  Psychiatric: She has a normal mood and affect. Her speech is normal and behavior is normal. Judgment and thought content normal. Her mood appears not anxious. Cognition and memory are normal. She does not exhibit a depressed mood.          Assessment & Plan:

## 2011-11-22 ENCOUNTER — Ambulatory Visit (INDEPENDENT_AMBULATORY_CARE_PROVIDER_SITE_OTHER): Payer: BC Managed Care – PPO | Admitting: *Deleted

## 2011-11-22 DIAGNOSIS — D518 Other vitamin B12 deficiency anemias: Secondary | ICD-10-CM

## 2011-11-22 MED ORDER — CYANOCOBALAMIN 1000 MCG/ML IJ SOLN
1000.0000 ug | Freq: Once | INTRAMUSCULAR | Status: AC
  Administered 2011-11-22: 1000 ug via INTRAMUSCULAR

## 2012-02-08 ENCOUNTER — Telehealth: Payer: Self-pay | Admitting: Family Medicine

## 2012-02-08 DIAGNOSIS — D518 Other vitamin B12 deficiency anemias: Secondary | ICD-10-CM

## 2012-02-08 DIAGNOSIS — E7800 Pure hypercholesterolemia, unspecified: Secondary | ICD-10-CM

## 2012-02-08 DIAGNOSIS — E119 Type 2 diabetes mellitus without complications: Secondary | ICD-10-CM

## 2012-02-08 DIAGNOSIS — I10 Essential (primary) hypertension: Secondary | ICD-10-CM

## 2012-02-08 DIAGNOSIS — D509 Iron deficiency anemia, unspecified: Secondary | ICD-10-CM

## 2012-02-08 NOTE — Telephone Encounter (Signed)
Message copied by Excell Seltzer on Sat Feb 08, 2012  9:12 AM ------      Message from: Alvina Chou      Created: Fri Feb 07, 2012 10:06 AM      Regarding: lab orders for Friday, 6-7       Patient is scheduled for CPX labs, please order future labs, Thanks , Camelia Eng

## 2012-02-08 NOTE — Telephone Encounter (Signed)
Whole bunch of future labs already in.. Use those orders.

## 2012-02-14 ENCOUNTER — Other Ambulatory Visit (INDEPENDENT_AMBULATORY_CARE_PROVIDER_SITE_OTHER): Payer: BC Managed Care – PPO

## 2012-02-14 DIAGNOSIS — E119 Type 2 diabetes mellitus without complications: Secondary | ICD-10-CM

## 2012-02-14 DIAGNOSIS — E7800 Pure hypercholesterolemia, unspecified: Secondary | ICD-10-CM

## 2012-02-14 DIAGNOSIS — D509 Iron deficiency anemia, unspecified: Secondary | ICD-10-CM

## 2012-02-14 DIAGNOSIS — D518 Other vitamin B12 deficiency anemias: Secondary | ICD-10-CM

## 2012-02-14 LAB — COMPREHENSIVE METABOLIC PANEL
Albumin: 3.6 g/dL (ref 3.5–5.2)
CO2: 25 mEq/L (ref 19–32)
Chloride: 108 mEq/L (ref 96–112)
GFR: 123.19 mL/min (ref >60.00)
Glucose, Bld: 65 mg/dL — ABNORMAL LOW (ref 70–99)
Potassium: 4 mEq/L (ref 3.5–5.1)
Sodium: 140 mEq/L (ref 135–145)
Total Protein: 7.1 g/dL (ref 6.0–8.3)

## 2012-02-14 LAB — CBC WITH DIFFERENTIAL/PLATELET
Basophils Relative: 1.2 % (ref 0.0–3.0)
Eosinophils Absolute: 0 10*3/uL (ref 0.0–0.7)
Hemoglobin: 12.4 g/dL (ref 12.0–15.0)
Lymphocytes Relative: 20.1 % (ref 12.0–46.0)
MCHC: 32 g/dL (ref 30.0–36.0)
Neutro Abs: 3.6 10*3/uL (ref 1.4–7.7)
RBC: 4.5 Mil/uL (ref 3.87–5.11)

## 2012-02-14 LAB — IBC PANEL
Saturation Ratios: 5.4 % — ABNORMAL LOW (ref 20.0–50.0)
Transferrin: 266.3 mg/dL (ref 212.0–360.0)

## 2012-02-14 LAB — LIPID PANEL
Cholesterol: 164 mg/dL (ref 0–200)
LDL Cholesterol: 95 mg/dL (ref 0–99)

## 2012-02-21 ENCOUNTER — Other Ambulatory Visit (HOSPITAL_COMMUNITY)
Admission: RE | Admit: 2012-02-21 | Discharge: 2012-02-21 | Disposition: A | Discharge status: Home / Self Care | Payer: BC Managed Care – PPO | Source: Ambulatory Visit | Attending: Family Medicine | Admitting: Family Medicine

## 2012-02-21 ENCOUNTER — Encounter: Payer: Self-pay | Admitting: Family Medicine

## 2012-02-21 ENCOUNTER — Ambulatory Visit (INDEPENDENT_AMBULATORY_CARE_PROVIDER_SITE_OTHER)
Admission: RE | Admit: 2012-02-21 | Discharge: 2012-02-21 | Disposition: A | Discharge status: Home / Self Care | Payer: BC Managed Care – PPO | Source: Ambulatory Visit | Attending: Family Medicine | Admitting: Family Medicine

## 2012-02-21 ENCOUNTER — Ambulatory Visit (INDEPENDENT_AMBULATORY_CARE_PROVIDER_SITE_OTHER): Payer: BC Managed Care – PPO | Admitting: Family Medicine

## 2012-02-21 VITALS — BP 140/84 | HR 68 | Temp 97.5°F | Ht 60.0 in | Wt 129.2 lb

## 2012-02-21 DIAGNOSIS — M25539 Pain in unspecified wrist: Secondary | ICD-10-CM

## 2012-02-21 DIAGNOSIS — Z01419 Encounter for gynecological examination (general) (routine) without abnormal findings: Secondary | ICD-10-CM

## 2012-02-21 DIAGNOSIS — N921 Excessive and frequent menstruation with irregular cycle: Secondary | ICD-10-CM

## 2012-02-21 DIAGNOSIS — Z1159 Encounter for screening for other viral diseases: Secondary | ICD-10-CM | POA: Insufficient documentation

## 2012-02-21 DIAGNOSIS — E119 Type 2 diabetes mellitus without complications: Secondary | ICD-10-CM

## 2012-02-21 DIAGNOSIS — I10 Essential (primary) hypertension: Secondary | ICD-10-CM

## 2012-02-21 DIAGNOSIS — E7800 Pure hypercholesterolemia, unspecified: Secondary | ICD-10-CM

## 2012-02-21 DIAGNOSIS — M25531 Pain in right wrist: Secondary | ICD-10-CM | POA: Insufficient documentation

## 2012-02-21 DIAGNOSIS — E538 Deficiency of other specified B group vitamins: Secondary | ICD-10-CM

## 2012-02-21 DIAGNOSIS — Z0000 Encounter for general adult medical examination without abnormal findings: Secondary | ICD-10-CM

## 2012-02-21 DIAGNOSIS — G2589 Other specified extrapyramidal and movement disorders: Secondary | ICD-10-CM

## 2012-02-21 MED ORDER — ROPINIROLE HCL 3 MG PO TABS: ORAL_TABLET | ORAL | Status: DC

## 2012-02-21 MED ORDER — CYANOCOBALAMIN 1000 MCG/ML IJ SOLN
1000.0000 ug | Freq: Once | INTRAMUSCULAR | Status: AC
  Administered 2012-02-21: 1000 ug via INTRAMUSCULAR

## 2012-02-21 MED ORDER — LISINOPRIL-HYDROCHLOROTHIAZIDE 10-12.5 MG PO TABS: 1.0000 | ORAL_TABLET | Freq: Every day | ORAL | Status: DC

## 2012-02-21 NOTE — Progress Notes (Signed)
Subjective:    Patient ID: Brenda Hawkins, female    DOB: December 02, 1965, 46 y.o.   MRN: [id number]  HPI The patient is here for annual wellness exam and preventative care.    She noted easy bruising in last few months on left arm.  Pain in right thumb after whacking base of thumb  2 days ago with bungy cord. Iced. Now radial side of hand is numb/tingling, sore to elbow. Pain in MCP joint, swelling and bruising at base of thumb.  RLS poor control:  On 2 mg nightly of ropinrole.  She feels well overall. Mood doing fairly well. Some issues with insomnia due to RLS.  Hot falshes. Menses irregular.Marland Kitchen Spotting off and on for months at a time, but has done this for 1 year.  No tsure how old mom was at menopause.   Hypertension:  Borderline control on no med.  Using medication without problems or lightheadedness: None Chest pain with exertion:None Edema:None Short of breath:stable Average home BPs: 140-145/80-90 Other issues:  Elevated Cholesterol:LDL now at goal <100 on no med. Lab Results  Component Value Date   CHOL 164 02/14/2012   HDL 44.90 02/14/2012   LDLCALC 95 02/14/2012   LDLDIRECT 145.8 08/15/2011   TRIG 120.0 02/14/2012   CHOLHDL 4 02/14/2012    Diet compliance: Exercise: Other complaints:  Diabetes: Moderate control on no med. Lab Results  Component Value Date   HGBA1C 6.9* 02/14/2012    Using medications without difficulties: Hypoglycemic episodes: Hyperglycemic episodes: Feet problems: Blood Sugars averaging: eye exam within last year:  Anemia following gastric bypass:on 3 tabs iron a day.   B12 def: more energy now taking regualrly.    Review of Systems  Constitutional: Negative for fever and fatigue.  HENT: Negative for ear pain.   Eyes: Negative for pain.  Respiratory: Negative for chest tightness and shortness of breath.   Cardiovascular: Negative for chest pain, palpitations and leg swelling.  Gastrointestinal: Negative for abdominal pain.  Genitourinary:  Negative for dysuria.       Objective:   Physical Exam  Constitutional: Vital signs are normal. She appears well-developed and well-nourished. She is cooperative.  Non-toxic appearance. She does not appear ill. No distress.  HENT:  Head: Normocephalic.  Right Ear: Hearing, tympanic membrane, external ear and ear canal normal. Tympanic membrane is not erythematous, not retracted and not bulging.  Left Ear: Hearing, tympanic membrane, external ear and ear canal normal. Tympanic membrane is not erythematous, not retracted and not bulging.  Nose: Nose normal. No mucosal edema or rhinorrhea. Right sinus exhibits no maxillary sinus tenderness and no frontal sinus tenderness. Left sinus exhibits no maxillary sinus tenderness and no frontal sinus tenderness.  Mouth/Throat: Uvula is midline, oropharynx is clear and moist and mucous membranes are normal.  Eyes: Conjunctivae, EOM and lids are normal. Pupils are equal, round, and reactive to light. No foreign bodies found.  Neck: Trachea normal and normal range of motion. Neck supple. Carotid bruit is not present. No mass and no thyromegaly present.  Cardiovascular: Normal rate, regular rhythm, S1 normal, S2 normal, normal heart sounds, intact distal pulses and normal pulses.  Exam reveals no gallop and no friction rub.   No murmur heard. Pulmonary/Chest: Effort normal and breath sounds normal. Not tachypneic. No respiratory distress. She has no decreased breath sounds. She has no wheezes. She has no rhonchi. She has no rales.  Abdominal: Soft. Normal appearance and bowel sounds are normal. She exhibits no distension, no fluid wave, no abdominal  bruit and no mass. There is no hepatosplenomegaly. There is no tenderness. There is no rebound, no guarding and no CVA tenderness. No hernia.  Genitourinary: Vagina normal and uterus normal. No breast swelling, tenderness, discharge or bleeding. Pelvic exam was performed with patient prone. There is no rash,  tenderness or lesion on the right labia. There is no rash, tenderness or lesion on the left labia. Uterus is not enlarged and not tender. Cervix exhibits no motion tenderness, no discharge and no friability. Right adnexum displays no mass, no tenderness and no fullness. Left adnexum displays no mass, no tenderness and no fullness.  Musculoskeletal:       Pain, bruising and swelling in right 1st MCP joint, brusing into wrist and forearm, pain in anatomical snuff box   Lymphadenopathy:    She has no cervical adenopathy.    She has no axillary adenopathy.  Neurological: She is alert. She has normal strength. No cranial nerve deficit or sensory deficit.  Skin: Skin is warm, dry and intact. No rash noted.  Psychiatric: Her speech is normal and behavior is normal. Judgment and thought content normal. Her mood appears not anxious. Cognition and memory are normal. She does not exhibit a depressed mood.    Diabetic foot exam: Normal inspection No skin breakdown No calluses  Normal DP pulses Normal sensation to light touch and monofilament Nails normal       Assessment & Plan:  The patient's preventative maintenance and recommended screening tests for an annual wellness exam were reviewed in full today. Brought up to date unless services declined.  Counselled on the importance of diet, exercise, and its role in overall health and mortality. The patient's FH and SH was reviewed, including their home life, tobacco status, and drug and alcohol status.   Vaccines:Uptodate with Td.  Mammo: Last done 01/2011, not interested in following this.  PAP/DVE: Due, last done in 2009, if normal will space every 2 years.

## 2012-02-21 NOTE — Assessment & Plan Note (Signed)
LDL at gaol. Encouraged exercise, weight loss, healthy eating habits.

## 2012-02-21 NOTE — Assessment & Plan Note (Signed)
Eval with X-ray. Will call pt with results.

## 2012-02-21 NOTE — Assessment & Plan Note (Signed)
Not interested in checking ferritin. Increase ropinrole to 3 mg daily.

## 2012-02-21 NOTE — Assessment & Plan Note (Signed)
Nml GYN exam today. ? Due to perimenopause. If continuing to be spotting at follow up... Will consider [place] (pt refused today)

## 2012-02-21 NOTE — Assessment & Plan Note (Signed)
A1C trending up. Work on lifestyle change. Recheck in 6 months.

## 2012-02-21 NOTE — Assessment & Plan Note (Addendum)
Poor control, start lisinopril HCTZ. Refuses month follow up. WIll call with BPs in next few weeks.

## 2012-02-21 NOTE — Patient Instructions (Addendum)
Increase ropinrole to 3 mg daily. Can decrease to 1-2 tabs of iron a day.  Work on healthy eating (low carb, high fiber/protein), exercise. Start HCTZ/lisinopril for blood pressure.  Follow BP at home: call in next 1-2 weeks with Bp measurements. Goal <130/80  Follow up in 6 month DM fasting labs prior.

## 2012-02-27 ENCOUNTER — Encounter: Payer: Self-pay | Admitting: *Deleted

## 2013-02-12 ENCOUNTER — Telehealth: Payer: Self-pay | Admitting: Family Medicine

## 2013-02-12 DIAGNOSIS — E7800 Pure hypercholesterolemia, unspecified: Secondary | ICD-10-CM

## 2013-02-12 DIAGNOSIS — D518 Other vitamin B12 deficiency anemias: Secondary | ICD-10-CM

## 2013-02-12 DIAGNOSIS — E119 Type 2 diabetes mellitus without complications: Secondary | ICD-10-CM

## 2013-02-12 DIAGNOSIS — D509 Iron deficiency anemia, unspecified: Secondary | ICD-10-CM

## 2013-02-12 DIAGNOSIS — M899 Disorder of bone, unspecified: Secondary | ICD-10-CM

## 2013-02-12 NOTE — Telephone Encounter (Signed)
Message copied by Excell Seltzer on Fri Feb 12, 2013  2:05 PM ------      Message from: Alvina Chou      Created: Mon Feb 08, 2013  3:52 PM      Regarding: Lab orders for Tuesday, 6.10.14       Patient is scheduled for CPX labs, please order future labs, Thanks , Terri       ------

## 2013-02-16 ENCOUNTER — Other Ambulatory Visit (INDEPENDENT_AMBULATORY_CARE_PROVIDER_SITE_OTHER): Payer: BC Managed Care – PPO

## 2013-02-16 DIAGNOSIS — I10 Essential (primary) hypertension: Secondary | ICD-10-CM

## 2013-02-16 DIAGNOSIS — E7800 Pure hypercholesterolemia, unspecified: Secondary | ICD-10-CM

## 2013-02-16 DIAGNOSIS — D509 Iron deficiency anemia, unspecified: Secondary | ICD-10-CM

## 2013-02-16 DIAGNOSIS — E119 Type 2 diabetes mellitus without complications: Secondary | ICD-10-CM

## 2013-02-16 DIAGNOSIS — M949 Disorder of cartilage, unspecified: Secondary | ICD-10-CM

## 2013-02-16 DIAGNOSIS — M899 Disorder of bone, unspecified: Secondary | ICD-10-CM

## 2013-02-16 LAB — COMPREHENSIVE METABOLIC PANEL
CO2: 25 mEq/L (ref 19–32)
Calcium: 9 mg/dL (ref 8.4–10.5)
Chloride: 110 mEq/L (ref 96–112)
Creatinine, Ser: 0.7 mg/dL (ref 0.4–1.2)
GFR: 93.27 mL/min (ref >60.00)
Glucose, Bld: 72 mg/dL (ref 70–99)
Total Bilirubin: 0.3 mg/dL (ref 0.3–1.2)

## 2013-02-16 LAB — LIPID PANEL
Cholesterol: 209 mg/dL — ABNORMAL HIGH (ref 0–200)
HDL: 43 mg/dL (ref >39.00)
VLDL: 25.6 mg/dL (ref 0.0–40.0)

## 2013-02-23 ENCOUNTER — Ambulatory Visit (INDEPENDENT_AMBULATORY_CARE_PROVIDER_SITE_OTHER): Payer: BC Managed Care – PPO | Admitting: Family Medicine

## 2013-02-23 ENCOUNTER — Encounter: Payer: Self-pay | Admitting: Family Medicine

## 2013-02-23 VITALS — BP 160/98 | HR 65 | Temp 98.0°F | Ht 60.0 in | Wt 141.5 lb

## 2013-02-23 DIAGNOSIS — I10 Essential (primary) hypertension: Secondary | ICD-10-CM

## 2013-02-23 DIAGNOSIS — E119 Type 2 diabetes mellitus without complications: Secondary | ICD-10-CM

## 2013-02-23 DIAGNOSIS — E7800 Pure hypercholesterolemia, unspecified: Secondary | ICD-10-CM

## 2013-02-23 MED ORDER — ROPINIROLE HCL 1 MG PO TABS: 1.0000 mg | ORAL_TABLET | Freq: Every day | ORAL | Status: DC

## 2013-02-23 NOTE — Patient Instructions (Addendum)
Start B12 1000 mcg daily since IM injection is not available.  Try ropinerole 1 mg earlier in evening, continue 3 mg at bedtime.  If  no improvement in 2 weeks.. Call for trial gabapentin instead of ropinerole. Check BP at home... Call with measurements over next 2 weeks. If greater than 140/90 x 3, we will need to start a BP medication. Get back on track with diet, decrease take out food, eating out.  Work on regular exercise.

## 2013-02-23 NOTE — Assessment & Plan Note (Addendum)
Moderate control. Encouraged exercise, weight loss, healthy eating habits.

## 2013-02-23 NOTE — Assessment & Plan Note (Signed)
Poor control here... Follow at home, call with measurements. Likely needs medication.

## 2013-02-23 NOTE — Progress Notes (Signed)
HPI  The patient is here for annual wellness exam and preventative care.   ZOX:WRUEAVWU control on 3 mg nightly of ropinrole. Jerking of legs and restless feeling ramins but able to sleep once she takes the medication.  She feels well overall. Mood doing fairly well.    Hypertension: Borderline control on no med.  She feels it is elevated today with white coat HTN Using medication without problems or lightheadedness: None  Chest pain with exertion:None  Edema:None  Short of breath:stable  Average home BPs: has not checked lately. Other issues:   Elevated Cholesterol:Worsened control on no med! Far from goal LDL <100.  Lab Results  Component Value Date   CHOL 209* 02/16/2013   HDL 43.00 02/16/2013   LDLCALC 95 02/14/2012   LDLDIRECT 141.5 02/16/2013   TRIG 128.0 02/16/2013   CHOLHDL 5 02/16/2013   Diet compliance:  Has not been eating well. Exercise:  Limited. Wlaking some. Other complaints:   Diabetes: Moderate control on no med.  Lab Results  Component Value Date   HGBA1C 6.9* 02/16/2013   Using medications without difficulties:  Hypoglycemic episodes:  Hyperglycemic episodes:  Feet problems: Has noted severeal months of feet feeling cold, occ tingly. No burning no numbness. 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   B12 def: more energy now taking regualrly.   Review of Systems  Constitutional: Negative for fever and fatigue.  HENT: Negative for ear pain.  Eyes: Negative for pain.  Respiratory: Negative for chest tightness and shortness of breath.  Cardiovascular: Negative for chest pain, palpitations and leg swelling.  Gastrointestinal: Negative for abdominal pain.  Genitourinary: Negative for dysuria.  Objective:   Physical Exam  Constitutional: Vital signs are normal. She appears well-developed and well-nourished. She is cooperative. Non-toxic appearance. She does not appear ill. No distress.   HENT:  Head: Normocephalic.  Right Ear: Hearing, tympanic membrane, external ear and ear canal normal. Tympanic membrane is not erythematous, not retracted and not bulging.  Left Ear: Hearing, tympanic membrane, external ear and ear canal normal. Tympanic membrane is not erythematous, not retracted and not bulging.  Nose: Nose normal. No mucosal edema or rhinorrhea. Right sinus exhibits no maxillary sinus tenderness and no frontal sinus tenderness. Left sinus exhibits no maxillary sinus tenderness and no frontal sinus tenderness.  Mouth/Throat: Uvula is midline, oropharynx is clear and moist and mucous membranes are normal.  Eyes: Conjunctivae, EOM and lids are normal. Pupils are equal, round, and reactive to light. No foreign bodies found.  Neck: Trachea normal and normal range of motion. Neck supple. Carotid bruit is not present. No mass and no thyromegaly present.  Cardiovascular: Normal rate, regular rhythm, S1 normal, S2 normal, normal heart sounds, intact distal pulses and normal pulses. Exam reveals no gallop and no friction rub.  No murmur heard.  Pulmonary/Chest: Effort normal and breath sounds normal. Not tachypneic. No respiratory distress. She has no decreased breath sounds. She has no wheezes. She has no rhonchi. She has no rales.  Abdominal: Soft. Normal appearance and bowel sounds are normal. She exhibits no distension, no fluid wave, no abdominal bruit and no mass. There is no hepatosplenomegaly. There is no tenderness. There is no rebound, no guarding and no CVA tenderness. No hernia.  Genitourinary: Vagina normal and uterus normal. No breast swelling, tenderness, discharge or bleeding. Pelvic exam was performed with patient prone. There is no rash, tenderness or lesion on the right  labia. There is no rash, tenderness or lesion on the left labia. Uterus is not enlarged and not tender. Cervix exhibits no motion tenderness, no discharge and no friability. Right adnexum displays no mass,  no tenderness and no fullness. Left adnexum displays no mass, no tenderness and no fullness.  Musculoskeletal:  Lymphadenopathy:  She has no cervical adenopathy.  She has no axillary adenopathy.  Neurological: She is alert. She has normal strength. No cranial nerve deficit or sensory deficit.  Skin: Skin is warm, dry and intact. No rash noted.  Psychiatric: Her speech is normal and behavior is normal. Judgment and thought content normal. Her mood appears not anxious. Cognition and memory are normal. She does not exhibit a depressed mood.  Diabetic foot exam:  Normal inspection  No skin breakdown  No calluses  Normal DP pulses  Normal sensation to light touch and monofilament  Nails normal  Assessment & Plan:   The patient's preventative maintenance and recommended screening tests for an annual wellness exam were reviewed in full today.  Brought up to date unless services declined.  Counselled on the importance of diet, exercise, and its role in overall health and mortality.  The patient's FH and SH was reviewed, including their home life, tobacco status, and drug and alcohol status.   Vaccines:Uptodate with Td.  Due 2016. Mammo: Last done 01/2011, not interested in following this.  PAP/DVE: Nml 2013 on  every 2 years schedule.

## 2013-02-23 NOTE — Assessment & Plan Note (Signed)
Poor control. Pt wants to work on lifestyle , no med. Check in 3 months.

## 2013-02-25 ENCOUNTER — Other Ambulatory Visit: Payer: Self-pay | Admitting: *Deleted

## 2013-02-25 MED ORDER — ROPINIROLE HCL 3 MG PO TABS: 3.0000 mg | ORAL_TABLET | Freq: Every day | ORAL | Status: DC

## 2013-02-25 NOTE — Telephone Encounter (Signed)
Last filled 01/27/13 

## 2014-02-25 ENCOUNTER — Other Ambulatory Visit: Payer: Self-pay | Admitting: *Deleted

## 2014-02-25 MED ORDER — ROPINIROLE HCL 3 MG PO TABS: 3.0000 mg | ORAL_TABLET | Freq: Every day | ORAL | Status: DC

## 2014-02-25 NOTE — Telephone Encounter (Signed)
Ok to refill 

## 2014-03-24 ENCOUNTER — Encounter: Payer: Self-pay | Admitting: Family Medicine

## 2014-03-24 ENCOUNTER — Ambulatory Visit (INDEPENDENT_AMBULATORY_CARE_PROVIDER_SITE_OTHER): Payer: BC Managed Care – PPO | Admitting: Family Medicine

## 2014-03-24 VITALS — BP 114/74 | HR 72 | Temp 98.2°F | Ht 60.24 in | Wt 134.5 lb

## 2014-03-24 DIAGNOSIS — G2589 Other specified extrapyramidal and movement disorders: Secondary | ICD-10-CM

## 2014-03-24 DIAGNOSIS — R609 Edema, unspecified: Secondary | ICD-10-CM

## 2014-03-24 DIAGNOSIS — E7800 Pure hypercholesterolemia, unspecified: Secondary | ICD-10-CM

## 2014-03-24 DIAGNOSIS — I10 Essential (primary) hypertension: Secondary | ICD-10-CM

## 2014-03-24 DIAGNOSIS — D518 Other vitamin B12 deficiency anemias: Secondary | ICD-10-CM

## 2014-03-24 DIAGNOSIS — D509 Iron deficiency anemia, unspecified: Secondary | ICD-10-CM

## 2014-03-24 DIAGNOSIS — E119 Type 2 diabetes mellitus without complications: Secondary | ICD-10-CM

## 2014-03-24 DIAGNOSIS — R600 Localized edema: Secondary | ICD-10-CM | POA: Insufficient documentation

## 2014-03-24 MED ORDER — ROPINIROLE HCL 1 MG PO TABS: ORAL_TABLET | ORAL | Status: DC

## 2014-03-24 MED ORDER — ROPINIROLE HCL 3 MG PO TABS
3.0000 mg | ORAL_TABLET | Freq: Every day | ORAL | Status: DC
Start: 2014-03-24 — End: 2016-01-01

## 2014-03-24 NOTE — Assessment & Plan Note (Addendum)
Moderate control on ropinerole 4 mg ( 1 mg earlier in evening , 3 mg at bedtime).

## 2014-03-24 NOTE — Assessment & Plan Note (Signed)
Due for re-eval. 

## 2014-03-24 NOTE — Assessment & Plan Note (Signed)
Will eval with labs for secondary causes. Most likely venous insufficiency. Rec compression hose, exercise, elevation above heart. No sign of DVT, no cardiac or resp symp.

## 2014-03-24 NOTE — Progress Notes (Signed)
Subjective:    Patient ID: Brenda Hawkins, female    DOB: 1966/05/02, 48 y.o.   MRN: [id number]  HPI  48 year old female with DM and early family hx of CAD returns for med refill.  She was last seen over a year ago.  She has noted in last 3 month swelling later in the day. Better when awaking in AM> She has also noted burning in left 4th and 5th digit.  RLS: Improved control on 3 mg nightly of ropinrole.  1 mg helping during the day but she is out.  She feels well overall. Mood doing fairly well.   Hypertension: Well controlled on no med. BP Readings from Last 3 Encounters:  03/24/14 114/74  02/23/13 160/98  02/21/12 140/84  Using medication without problems or lightheadedness: None  Chest pain with exertion:None  Edema:Yes. Short of breath:stable  Average home BPs: has not checked lately.  Other issues:   Elevated Cholesterol:Due for re-eval.  Lab Results  Component Value Date   CHOL 209* 02/16/2013   HDL 43.00 02/16/2013   LDLCALC 95 02/14/2012   LDLDIRECT 141.5 02/16/2013   TRIG 128.0 02/16/2013   CHOLHDL 5 02/16/2013  Diet compliance: Has not been eating well.  Exercise: Limited. Walking some.  Other complaints:   Diabetes: Due for re-eval.  Lab Results   Component  Value  Date    HGBA1C  6.9*  02/16/2013   Using medications without difficulties:  Hypoglycemic episodes:  Hyperglycemic episodes:  Feet problems: Has noted severeal months of feet feeling cold, occ tingly. No burning no numbness.  Blood Sugars averaging: not checking. 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   B12 def: more energy now taking regualrly.     Review of Systems  Constitutional: Negative for fever and fatigue.  HENT: Negative for ear pain.   Eyes: Negative for pain.  Respiratory: Negative for chest tightness and shortness of breath.   Cardiovascular: Negative for chest pain, palpitations and leg swelling.  Gastrointestinal: Negative for  abdominal pain.  Genitourinary: Negative for dysuria.       Objective:   Physical Exam  Constitutional: She is oriented to person, place, and time. Vital signs are normal. She appears well-developed and well-nourished. She is cooperative.  Non-toxic appearance. She does not appear ill. No distress.  overweight  HENT:  Head: Normocephalic.  Right Ear: Hearing, tympanic membrane, external ear and ear canal normal. Tympanic membrane is not erythematous, not retracted and not bulging.  Left Ear: Hearing, tympanic membrane, external ear and ear canal normal. Tympanic membrane is not erythematous, not retracted and not bulging.  Nose: No mucosal edema or rhinorrhea. Right sinus exhibits no maxillary sinus tenderness and no frontal sinus tenderness. Left sinus exhibits no maxillary sinus tenderness and no frontal sinus tenderness.  Mouth/Throat: Uvula is midline, oropharynx is clear and moist and mucous membranes are normal.  Eyes: Conjunctivae, EOM and lids are normal. Pupils are equal, round, and reactive to light. Lids are everted and swept, no foreign bodies found.  Neck: Trachea normal and normal range of motion. Neck supple. Carotid bruit is not present. No mass and no thyromegaly present.  Cardiovascular: Normal rate, regular rhythm, S1 normal, S2 normal, normal heart sounds, intact distal pulses and normal pulses.  Exam reveals no gallop and no friction rub.   No murmur heard. No edema, varicose veins right greater than left.  Pulmonary/Chest: Effort normal and breath sounds normal. Not tachypneic.  No respiratory distress. She has no decreased breath sounds. She has no wheezes. She has no rhonchi. She has no rales.  Abdominal: Soft. Normal appearance and bowel sounds are normal. There is no tenderness.  Neurological: She is alert and oriented to person, place, and time.  Skin: Skin is warm, dry and intact. No rash noted.  Psychiatric: Her speech is normal and behavior is normal. Judgment and  thought content normal. Her mood appears not anxious. Cognition and memory are normal. She does not exhibit a depressed mood.          Assessment & Plan:

## 2014-03-24 NOTE — Assessment & Plan Note (Signed)
Due for re-eval. Encouraged exercise, weight loss, healthy eating habits.  

## 2014-03-24 NOTE — Progress Notes (Signed)
Pre visit review using our clinic review tool, if applicable. No additional management support is needed unless otherwise documented below in the visit note. 

## 2014-03-24 NOTE — Patient Instructions (Signed)
Schedule CPX with labs prior. Work on [employer]healthy eating and regular exercise. Schedule yearly eye exam. Elevated feet above hear. Wear compression hose as tolerated.

## 2014-03-24 NOTE — Assessment & Plan Note (Signed)
,  Well controlled. Continue current medication.  

## 2014-03-25 ENCOUNTER — Telehealth: Payer: Self-pay | Admitting: Family Medicine

## 2014-03-25 NOTE — Telephone Encounter (Signed)
Relevant patient education mailed to patient.  

## 2014-06-10 ENCOUNTER — Other Ambulatory Visit (INDEPENDENT_AMBULATORY_CARE_PROVIDER_SITE_OTHER): Payer: BC Managed Care – PPO

## 2014-06-10 DIAGNOSIS — E7800 Pure hypercholesterolemia, unspecified: Secondary | ICD-10-CM

## 2014-06-10 DIAGNOSIS — M949 Disorder of cartilage, unspecified: Secondary | ICD-10-CM

## 2014-06-10 DIAGNOSIS — D518 Other vitamin B12 deficiency anemias: Secondary | ICD-10-CM

## 2014-06-10 DIAGNOSIS — E669 Obesity, unspecified: Secondary | ICD-10-CM

## 2014-06-10 DIAGNOSIS — M899 Disorder of bone, unspecified: Secondary | ICD-10-CM

## 2014-06-10 DIAGNOSIS — I10 Essential (primary) hypertension: Secondary | ICD-10-CM

## 2014-06-10 DIAGNOSIS — D509 Iron deficiency anemia, unspecified: Secondary | ICD-10-CM

## 2014-06-10 DIAGNOSIS — E119 Type 2 diabetes mellitus without complications: Secondary | ICD-10-CM

## 2014-06-10 DIAGNOSIS — R609 Edema, unspecified: Secondary | ICD-10-CM

## 2014-06-10 LAB — COMPREHENSIVE METABOLIC PANEL
ALT: 10 U/L (ref 0–35)
AST: 17 U/L (ref 0–37)
Albumin: 3.7 g/dL (ref 3.5–5.2)
Alkaline Phosphatase: 65 U/L (ref 39–117)
BILIRUBIN TOTAL: 0.2 mg/dL (ref 0.2–1.2)
BUN: 14 mg/dL (ref 6–23)
CO2: 21 mEq/L (ref 19–32)
CREATININE: 0.6 mg/dL (ref 0.4–1.2)
Calcium: 9.1 mg/dL (ref 8.4–10.5)
Chloride: 109 mEq/L (ref 96–112)
GFR: 124.56 mL/min (ref >60.00)
Glucose, Bld: 80 mg/dL (ref 70–99)
Potassium: 4 mEq/L (ref 3.5–5.1)
Sodium: 141 mEq/L (ref 135–145)
TOTAL PROTEIN: 7.5 g/dL (ref 6.0–8.3)

## 2014-06-10 LAB — CBC WITH DIFFERENTIAL/PLATELET
BASOS PCT: 0.7 % (ref 0.0–3.0)
Basophils Absolute: 0 10*3/uL (ref 0.0–0.1)
EOS PCT: 1.1 % (ref 0.0–5.0)
Eosinophils Absolute: 0.1 10*3/uL (ref 0.0–0.7)
HCT: 30 % — ABNORMAL LOW (ref 36.0–46.0)
Hemoglobin: 9.1 g/dL — ABNORMAL LOW (ref 12.0–15.0)
LYMPHS PCT: 19.9 % (ref 12.0–46.0)
Lymphs Abs: 0.9 10*3/uL (ref 0.7–4.0)
MCHC: 30.5 g/dL (ref 30.0–36.0)
MCV: 76.3 fl — ABNORMAL LOW (ref 78.0–100.0)
Monocytes Absolute: 0.4 10*3/uL (ref 0.1–1.0)
Monocytes Relative: 8.6 % (ref 3.0–12.0)
Neutro Abs: 3.3 10*3/uL (ref 1.4–7.7)
Neutrophils Relative %: 69.7 % (ref 43.0–77.0)
Platelets: 361 10*3/uL (ref 150.0–400.0)
RBC: 3.92 Mil/uL (ref 3.87–5.11)
RDW: 21 % — ABNORMAL HIGH (ref 11.5–15.5)
WBC: 4.7 10*3/uL (ref 4.0–10.5)

## 2014-06-10 LAB — TSH: TSH: 1.14 u[IU]/mL (ref 0.35–4.50)

## 2014-06-10 LAB — LIPID PANEL
CHOL/HDL RATIO: 5
Cholesterol: 201 mg/dL — ABNORMAL HIGH (ref 0–200)
HDL: 44.6 mg/dL (ref >39.00)
LDL Cholesterol: 131 mg/dL — ABNORMAL HIGH (ref 0–99)
NonHDL: 156.4
TRIGLYCERIDES: 128 mg/dL (ref 0.0–149.0)
VLDL: 25.6 mg/dL (ref 0.0–40.0)

## 2014-06-10 LAB — HEMOGLOBIN A1C: Hgb A1c MFr Bld: 6.6 % — ABNORMAL HIGH (ref 4.6–6.5)

## 2014-06-10 LAB — VITAMIN B12: Vitamin B-12: 172 pg/mL — ABNORMAL LOW (ref 211–911)

## 2014-06-17 ENCOUNTER — Encounter: Payer: Self-pay | Admitting: Family Medicine

## 2014-06-17 ENCOUNTER — Ambulatory Visit (INDEPENDENT_AMBULATORY_CARE_PROVIDER_SITE_OTHER): Payer: BC Managed Care – PPO | Admitting: Family Medicine

## 2014-06-17 VITALS — BP 140/62 | HR 71 | Temp 98.7°F | Ht 60.24 in | Wt 132.8 lb

## 2014-06-17 DIAGNOSIS — E7800 Pure hypercholesterolemia, unspecified: Secondary | ICD-10-CM

## 2014-06-17 DIAGNOSIS — I10 Essential (primary) hypertension: Secondary | ICD-10-CM

## 2014-06-17 DIAGNOSIS — E119 Type 2 diabetes mellitus without complications: Secondary | ICD-10-CM

## 2014-06-17 DIAGNOSIS — D509 Iron deficiency anemia, unspecified: Secondary | ICD-10-CM

## 2014-06-17 DIAGNOSIS — D518 Other vitamin B12 deficiency anemias: Secondary | ICD-10-CM

## 2014-06-17 LAB — HM DIABETES FOOT EXAM

## 2014-06-17 MED ORDER — FERROUS GLUCONATE 225 (27 FE) MG PO TABS: 240.0000 mg | ORAL_TABLET | Freq: Three times a day (TID) | ORAL | Status: DC

## 2014-06-17 MED ORDER — CYANOCOBALAMIN 1000 MCG/ML IJ SOLN
1000.0000 ug | Freq: Once | INTRAMUSCULAR | Status: AC
  Administered 2014-06-17: 1000 ug via INTRAMUSCULAR

## 2014-06-17 NOTE — Assessment & Plan Note (Signed)
She is anemic. Cannot increase iron due to SE. Will try ferrous gluconate to see if tolerated better. Take with OJ to increase absorption.

## 2014-06-17 NOTE — Assessment & Plan Note (Signed)
Inadequate control. She will consider starting red yeast rice. Return for repeat lipids in 3 months

## 2014-06-17 NOTE — Patient Instructions (Addendum)
Look into prevnar or pneumovax vaccine. Let us know if you are interested. Stop smoking. Work on low cholesterol diet. Start red yeast rice 1200 mg twice daily. Follow up for labs only in 3 months to check cholesterol.  Follow up DM and chol in 6 months with labs prior. Try different iron supplement: ferrous gluconate, try to slowly increase to three times daily, take with OJ.

## 2014-06-17 NOTE — Progress Notes (Signed)
Pre visit review using our clinic review tool, if applicable. No additional management support is needed unless otherwise documented below in the visit note. 

## 2014-06-17 NOTE — Progress Notes (Signed)
The patient is here for annual wellness exam and preventative care.   Hypertension: Borderline control on no med.  She has some white coat HTN. BP Readings from Last 3 Encounters:  06/17/14 140/62  03/24/14 114/74  02/23/13 160/98  Using medication without problems or lightheadedness: None  Chest pain with exertion:None  Edema:occ in left, later in the day.  Short of breath:stable  Average home BPs: 135-140/80 Other issues:   Elevated Cholesterol:LDL no longer at goal <100 on no med.  Lab Results  Component Value Date   CHOL 201* 06/10/2014   HDL 44.60 06/10/2014   LDLCALC 131* 06/10/2014   LDLDIRECT 141.5 02/16/2013   TRIG 128.0 06/10/2014   CHOLHDL 5 06/10/2014  Diet compliance:  Exercise:  Other complaints:   Diabetes: Moderate control on no med.  Lab Results  Component Value Date   HGBA1C 6.6* 06/10/2014  Using medications without difficulties:  Hypoglycemic episodes:  Hyperglycemic episodes:  Feet problems: none Blood Sugars averaging: not checking eye exam within last year: Has appt next week.  Anemia following gastric bypass from decreased absorption of B12 and iron: She has decreased iron to 1 tab daily given upset stomach. Hg has dropped from 12.4 to 9.0 No known blood loss, no vaginal bleeding.  B12 def:  She take B12 orally 1000 mcg daily. She has not had B12 inj a while.  Review of Systems  Constitutional: Negative for fever and fatigue.  HENT: Negative for ear pain.  Eyes: Negative for pain.  Respiratory: Negative for chest tightness and shortness of breath.  Cardiovascular: Negative for chest pain, palpitations and leg swelling.  Gastrointestinal: Negative for abdominal pain.  Genitourinary: Negative for dysuria.  Objective:   Physical Exam  Constitutional: Vital signs are normal. She appears well-developed and well-nourished. She is cooperative. Non-toxic appearance. She does not appear ill. No distress.  HENT:  Head: Normocephalic.  Right Ear:  Hearing, tympanic membrane, external ear and ear canal normal. Tympanic membrane is not erythematous, not retracted and not bulging.  Left Ear: Hearing, tympanic membrane, external ear and ear canal normal. Tympanic membrane is not erythematous, not retracted and not bulging.  Nose: Nose normal. No mucosal edema or rhinorrhea. Right sinus exhibits no maxillary sinus tenderness and no frontal sinus tenderness. Left sinus exhibits no maxillary sinus tenderness and no frontal sinus tenderness.  Mouth/Throat: Uvula is midline, oropharynx is clear and moist and mucous membranes are normal.  Eyes: Conjunctivae, EOM and lids are normal. Pupils are equal, round, and reactive to light. No foreign bodies found.  Neck: Trachea normal and normal range of motion. Neck supple. Carotid bruit is not present. No mass and no thyromegaly present.  Cardiovascular: Normal rate, regular rhythm, S1 normal, S2 normal, normal heart sounds, intact distal pulses and normal pulses. Exam reveals no gallop and no friction rub.  No murmur heard.  Pulmonary/Chest: Effort normal and breath sounds normal. Not tachypneic. No respiratory distress. She has no decreased breath sounds. She has no wheezes. She has no rhonchi. She has no rales.  Abdominal: Soft. Normal appearance and bowel sounds are normal. She exhibits no distension, no fluid wave, no abdominal bruit and no mass. There is no hepatosplenomegaly. There is no tenderness. There is no rebound, no guarding and no CVA tenderness. No hernia.  Genitourinary: Vagina normal and uterus normal. No breast swelling, tenderness, discharge or bleeding. Pelvic exam was performed with patient prone. There is no rash, tenderness or lesion on the right labia. There is no rash, tenderness or  lesion on the left labia. Uterus is not enlarged and not tender. Cervix exhibits no motion tenderness, no discharge and no friability. Right adnexum displays no mass, no tenderness and no fullness. Left adnexum  displays no mass, no tenderness and no fullness.  Lymphadenopathy:  She has no cervical adenopathy.  She has no axillary adenopathy.  Neurological: She is alert. She has normal strength. No cranial nerve deficit or sensory deficit.  Skin: Skin is warm, dry and intact. No rash noted.  Psychiatric: Her speech is normal and behavior is normal. Judgment and thought content normal. Her mood appears not anxious. Cognition and memory are normal. She does not exhibit a depressed mood.  GYN/BREAST: pt refuses.  Diabetic foot exam:  Normal inspection  No skin breakdown  No calluses  Normal DP pulses  Normal sensation to light touch and monofilament  Nails normal  Assessment & Plan:   The patient's preventative maintenance and recommended screening tests for an annual wellness exam were reviewed in full today.  Brought up to date unless services declined.  Counselled on the importance of diet, exercise, and its role in overall health and mortality.  The patient's FH and SH was reviewed, including their home life, tobacco status, and drug and alcohol status.   Vaccines:Uptodate with Td.  Refuses flu. Will look into pneumonia vaccine. Mammo: Not interested in following this.  No family history of breast cancer. PAP/DVE: Nml 2013, never had abnormal.Recommend yearly DVE, pt refuses both this year. She understands risk of not doing routine prevention. No early family of history of colon cancer. Occ smoker.

## 2014-06-17 NOTE — Assessment & Plan Note (Signed)
Borderline control. Continue current medication. Follow at home.

## 2014-06-17 NOTE — Assessment & Plan Note (Signed)
Restart B12 shots. Recheck in 3 months.

## 2014-06-17 NOTE — Assessment & Plan Note (Signed)
Well controlled with diet Encouraged exercise, weight loss, healthy eating habits.   

## 2014-09-19 ENCOUNTER — Other Ambulatory Visit (INDEPENDENT_AMBULATORY_CARE_PROVIDER_SITE_OTHER): Payer: BLUE CROSS/BLUE SHIELD

## 2014-09-19 DIAGNOSIS — E119 Type 2 diabetes mellitus without complications: Secondary | ICD-10-CM

## 2014-09-19 DIAGNOSIS — D509 Iron deficiency anemia, unspecified: Secondary | ICD-10-CM

## 2014-09-19 LAB — CBC WITH DIFFERENTIAL/PLATELET
Basophils Absolute: 0 10*3/uL (ref 0.0–0.1)
Basophils Relative: 0.2 % (ref 0.0–3.0)
Eosinophils Absolute: 0.1 10*3/uL (ref 0.0–0.7)
Eosinophils Relative: 1.8 % (ref 0.0–5.0)
HCT: 30.1 % — ABNORMAL LOW (ref 36.0–46.0)
HEMOGLOBIN: 9.1 g/dL — AB (ref 12.0–15.0)
LYMPHS PCT: 28.7 % (ref 12.0–46.0)
Lymphs Abs: 1.2 10*3/uL (ref 0.7–4.0)
MCHC: 30.1 g/dL (ref 30.0–36.0)
MCV: 73.3 fl — AB (ref 78.0–100.0)
MONO ABS: 0.3 10*3/uL (ref 0.1–1.0)
Monocytes Relative: 8.1 % (ref 3.0–12.0)
Neutro Abs: 2.6 10*3/uL (ref 1.4–7.7)
Neutrophils Relative %: 61.2 % (ref 43.0–77.0)
Platelets: 336 10*3/uL (ref 150.0–400.0)
RBC: 4.11 Mil/uL (ref 3.87–5.11)
RDW: 22.1 % — AB (ref 11.5–15.5)
WBC: 4.2 10*3/uL (ref 4.0–10.5)

## 2014-09-19 LAB — COMPREHENSIVE METABOLIC PANEL
ALT: 11 U/L (ref 0–35)
AST: 17 U/L (ref 0–37)
Albumin: 3.9 g/dL (ref 3.5–5.2)
Alkaline Phosphatase: 71 U/L (ref 39–117)
BUN: 14 mg/dL (ref 6–23)
CO2: 23 meq/L (ref 19–32)
CREATININE: 0.6 mg/dL (ref 0.4–1.2)
Calcium: 8.9 mg/dL (ref 8.4–10.5)
Chloride: 109 mEq/L (ref 96–112)
GFR: 110.4 mL/min (ref >60.00)
Glucose, Bld: 86 mg/dL (ref 70–99)
Potassium: 3.7 mEq/L (ref 3.5–5.1)
SODIUM: 141 meq/L (ref 135–145)
Total Bilirubin: 0.1 mg/dL — ABNORMAL LOW (ref 0.2–1.2)
Total Protein: 7.5 g/dL (ref 6.0–8.3)

## 2014-09-19 LAB — FERRITIN: FERRITIN: 1.5 ng/mL — AB (ref 10.0–291.0)

## 2014-09-19 LAB — IBC PANEL
Iron: 14 ug/dL — ABNORMAL LOW (ref 42–145)
Saturation Ratios: 3.2 % — ABNORMAL LOW (ref 20.0–50.0)
Transferrin: 313.6 mg/dL (ref 212.0–360.0)

## 2014-09-19 LAB — LIPID PANEL
Cholesterol: 197 mg/dL (ref 0–200)
HDL: 43.9 mg/dL (ref >39.00)
LDL CALC: 131 mg/dL — AB (ref 0–99)
NonHDL: 153.1
Total CHOL/HDL Ratio: 4
Triglycerides: 111 mg/dL (ref 0.0–149.0)
VLDL: 22.2 mg/dL (ref 0.0–40.0)

## 2014-09-19 LAB — VITAMIN B12: Vitamin B-12: 145 pg/mL — ABNORMAL LOW (ref 211–911)

## 2014-09-27 ENCOUNTER — Encounter: Payer: Self-pay | Admitting: *Deleted

## 2014-12-09 ENCOUNTER — Telehealth: Payer: Self-pay | Admitting: Family Medicine

## 2014-12-09 ENCOUNTER — Other Ambulatory Visit (INDEPENDENT_AMBULATORY_CARE_PROVIDER_SITE_OTHER): Payer: BLUE CROSS/BLUE SHIELD

## 2014-12-09 DIAGNOSIS — D509 Iron deficiency anemia, unspecified: Secondary | ICD-10-CM

## 2014-12-09 DIAGNOSIS — E119 Type 2 diabetes mellitus without complications: Secondary | ICD-10-CM | POA: Diagnosis not present

## 2014-12-09 DIAGNOSIS — D518 Other vitamin B12 deficiency anemias: Secondary | ICD-10-CM | POA: Diagnosis not present

## 2014-12-09 DIAGNOSIS — E7800 Pure hypercholesterolemia, unspecified: Secondary | ICD-10-CM

## 2014-12-09 LAB — COMPREHENSIVE METABOLIC PANEL
ALK PHOS: 68 U/L (ref 39–117)
ALT: 7 U/L (ref 0–35)
AST: 14 U/L (ref 0–37)
Albumin: 4.1 g/dL (ref 3.5–5.2)
BUN: 16 mg/dL (ref 6–23)
CALCIUM: 9.3 mg/dL (ref 8.4–10.5)
CO2: 27 mEq/L (ref 19–32)
Chloride: 106 mEq/L (ref 96–112)
Creatinine, Ser: 0.67 mg/dL (ref 0.40–1.20)
GFR: 98.99 mL/min (ref >60.00)
Glucose, Bld: 86 mg/dL (ref 70–99)
POTASSIUM: 3.9 meq/L (ref 3.5–5.1)
SODIUM: 138 meq/L (ref 135–145)
TOTAL PROTEIN: 7.2 g/dL (ref 6.0–8.3)
Total Bilirubin: 0.2 mg/dL (ref 0.2–1.2)

## 2014-12-09 LAB — CBC WITH DIFFERENTIAL/PLATELET
BASOS PCT: 1.7 % (ref 0.0–3.0)
Basophils Absolute: 0.1 10*3/uL (ref 0.0–0.1)
EOS ABS: 0.1 10*3/uL (ref 0.0–0.7)
Eosinophils Relative: 1.1 % (ref 0.0–5.0)
HCT: 28.4 % — ABNORMAL LOW (ref 36.0–46.0)
HEMOGLOBIN: 8.9 g/dL — AB (ref 12.0–15.0)
Lymphocytes Relative: 19.9 % (ref 12.0–46.0)
Lymphs Abs: 1 10*3/uL (ref 0.7–4.0)
MCHC: 31.3 g/dL (ref 30.0–36.0)
MCV: 69.8 fl — ABNORMAL LOW (ref 78.0–100.0)
MONO ABS: 0.3 10*3/uL (ref 0.1–1.0)
Monocytes Relative: 6.8 % (ref 3.0–12.0)
NEUTROS ABS: 3.4 10*3/uL (ref 1.4–7.7)
Neutrophils Relative %: 70.5 % (ref 43.0–77.0)
Platelets: 357 10*3/uL (ref 150.0–400.0)
RBC: 4.07 Mil/uL (ref 3.87–5.11)
RDW: 21.8 % — ABNORMAL HIGH (ref 11.5–15.5)
WBC: 4.9 10*3/uL (ref 4.0–10.5)

## 2014-12-09 LAB — LIPID PANEL
Cholesterol: 184 mg/dL (ref 0–200)
HDL: 50 mg/dL (ref >39.00)
LDL CALC: 111 mg/dL — AB (ref 0–99)
NONHDL: 134
Total CHOL/HDL Ratio: 4
Triglycerides: 115 mg/dL (ref 0.0–149.0)
VLDL: 23 mg/dL (ref 0.0–40.0)

## 2014-12-09 LAB — VITAMIN B12: Vitamin B-12: 119 pg/mL — ABNORMAL LOW (ref 211–911)

## 2014-12-09 LAB — MICROALBUMIN / CREATININE URINE RATIO
Creatinine,U: 168.1 mg/dL
MICROALB UR: 5.6 mg/dL — AB (ref 0.0–1.9)
MICROALB/CREAT RATIO: 3.3 mg/g (ref 0.0–30.0)

## 2014-12-09 LAB — HEMOGLOBIN A1C: Hgb A1c MFr Bld: 6.6 % — ABNORMAL HIGH (ref 4.6–6.5)

## 2014-12-09 NOTE — Telephone Encounter (Signed)
-----   Message from Baldomero LamyNatasha C Chavers sent at 12/05/2014  4:04 PM EDT ----- Regarding: 6 mo f/u labs Fri 12/09/14 Please order  future 6 mo f/u labs for pt's upcoming lab appt. The 10/15 note says dm and chol labs. Is that just an a1c and lipid? In her last labs in 1/16 she had low b12, ferritin, ibc pnl, and cbc. Do your want to recheck any of those labs? Thanks Rodney Boozeasha

## 2014-12-16 ENCOUNTER — Ambulatory Visit: Payer: BC Managed Care – PPO | Admitting: Family Medicine

## 2014-12-23 ENCOUNTER — Encounter: Payer: Self-pay | Admitting: Family Medicine

## 2014-12-23 ENCOUNTER — Ambulatory Visit (INDEPENDENT_AMBULATORY_CARE_PROVIDER_SITE_OTHER): Payer: BLUE CROSS/BLUE SHIELD | Admitting: Family Medicine

## 2014-12-23 VITALS — BP 140/66 | HR 80 | Temp 98.6°F | Ht 60.25 in | Wt 129.0 lb

## 2014-12-23 DIAGNOSIS — E7800 Pure hypercholesterolemia, unspecified: Secondary | ICD-10-CM

## 2014-12-23 DIAGNOSIS — I10 Essential (primary) hypertension: Secondary | ICD-10-CM

## 2014-12-23 DIAGNOSIS — D518 Other vitamin B12 deficiency anemias: Secondary | ICD-10-CM

## 2014-12-23 DIAGNOSIS — E119 Type 2 diabetes mellitus without complications: Secondary | ICD-10-CM

## 2014-12-23 DIAGNOSIS — D509 Iron deficiency anemia, unspecified: Secondary | ICD-10-CM

## 2014-12-23 DIAGNOSIS — Z1211 Encounter for screening for malignant neoplasm of colon: Secondary | ICD-10-CM

## 2014-12-23 LAB — POCT URINALYSIS DIPSTICK
Bilirubin, UA: NEGATIVE
Glucose, UA: NEGATIVE
KETONES UA: NEGATIVE
Leukocytes, UA: NEGATIVE
NITRITE UA: NEGATIVE
PH UA: 6
PROTEIN UA: NEGATIVE
RBC UA: NEGATIVE
Spec Grav, UA: 1.025
UROBILINOGEN UA: 0.2

## 2014-12-23 MED ORDER — CYANOCOBALAMIN 1000 MCG/ML IJ SOLN
1000.0000 ug | Freq: Once | INTRAMUSCULAR | Status: AC
  Administered 2014-12-23: 1000 ug via INTRAMUSCULAR

## 2014-12-23 NOTE — Assessment & Plan Note (Addendum)
B12 remains low.. Change to  B12 injections monthly.

## 2014-12-23 NOTE — Assessment & Plan Note (Signed)
Borderline control, follow.

## 2014-12-23 NOTE — Assessment & Plan Note (Signed)
Well controlled with lifestyle changes. 

## 2014-12-23 NOTE — Assessment & Plan Note (Signed)
Iron stores low.  Given more sudden drop in last few years will rule out blood loss with UA and stool cards. Most likely due to poor absorption. Will refer to heme for IV iron as she can not tolerate higher doses of oral iron.

## 2014-12-23 NOTE — Patient Instructions (Addendum)
Given B12 injection today. Return in 1 month for B12 inj with nurse visit. Continue counseling for stress. Complete stool cards and return. Stop at front desk to set up heme referral.

## 2014-12-23 NOTE — Assessment & Plan Note (Signed)
Improving with lifestyle changes. No t yet at goal < 100.

## 2014-12-23 NOTE — Progress Notes (Signed)
Pre visit review using our clinic review tool, if applicable. No additional management support is needed unless otherwise documented below in the visit note. 

## 2014-12-23 NOTE — Progress Notes (Signed)
49 year old female presents for follow up on chol, DM, HTN and anemia.  Hypertension: Borderline control on no med. She has some white coat HTN. BP Readings from Last 3 Encounters:  12/23/14 140/66  06/17/14 140/62  03/24/14 114/74  Using medication without problems or lightheadedness: None  Chest pain with exertion:None  Edema:occ in left, later in the day.  Short of breath:stable  Average home BPs: 135-140/80 Other issues:   Elevated Cholesterol:LDL improved and almost at goal <100 on no med.  Lab Results  Component Value Date   CHOL 184 12/09/2014   HDL 50.00 12/09/2014   LDLCALC 111* 12/09/2014   LDLDIRECT 141.5 02/16/2013   TRIG 115.0 12/09/2014   CHOLHDL 4 12/09/2014  Diet compliance: Improved. Exercise:  Minimal. Other complaints:   Diabetes: Good control on no med.  Lab Results  Component Value Date   HGBA1C 6.6* 12/09/2014  Using medications without difficulties:  Hypoglycemic episodes:  Hyperglycemic episodes:  Feet problems: none Blood Sugars averaging: not checking eye exam within last year: rescheduled.  Anemia following gastric bypass from decreased absorption of B12 and iron: She has decreased iron to 1 tab daily given upset stomach.Cannot increase iron due to SE. At last OV ferrous gluconate to see if tolerated better ( she did not tolerate better). Take with OJ to increase absorption Hg has dropped from 12.4 to 9.0 and now to 8.9. No known blood loss, no vaginal bleeding. No blood in stool. Has not had colonoscopy in past.  B12 def:now back on B12 inj ( every 6 months). Every  B12 remains low.  She feels tired all the time, dizzy when getting u[p quickly. Some clouded thinking.  She has been under a lot of stress since 09/2014. relationship with mother is poor.  She feels overwhelmed, irritable, angry a lot. Sleeping well for 4 hours, having early morning waking.  She is seeing a Veterinary surgeoncounselor.   Review of Systems  Constitutional:  Negative for fever and fatigue.  HENT: Negative for ear pain.  Eyes: Negative for pain.  Respiratory: Negative for chest tightness and shortness of breath.  Cardiovascular: Negative for chest pain, palpitations and leg swelling.  Gastrointestinal: Negative for abdominal pain.  Genitourinary: Negative for dysuria.  Objective:   Physical Exam  Constitutional: Vital signs are normal. She appears well-developed and well-nourished. She is cooperative. Non-toxic appearance. She does not appear ill. No distress.  HENT:  Head: Normocephalic.  Right Ear: Hearing, tympanic membrane, external ear and ear canal normal. Tympanic membrane is not erythematous, not retracted and not bulging.  Left Ear: Hearing, tympanic membrane, external ear and ear canal normal. Tympanic membrane is not erythematous, not retracted and not bulging.  Nose: Nose normal. No mucosal edema or rhinorrhea. Right sinus exhibits no maxillary sinus tenderness and no frontal sinus tenderness. Left sinus exhibits no maxillary sinus tenderness and no frontal sinus tenderness.  Mouth/Throat: Uvula is midline, oropharynx is clear and moist and mucous membranes are normal.  Eyes: Conjunctivae pale, EOM and lids are normal. Pupils are equal, round, and reactive to light. No foreign bodies found.  Neck: Trachea normal and normal range of motion. Neck supple. Carotid bruit is not present. No mass and no thyromegaly present.  Cardiovascular: Normal rate, regular rhythm, S1 normal, S2 normal, normal heart sounds, intact distal pulses and normal pulses. Exam reveals no gallop and no friction rub.  No murmur heard.  Pulmonary/Chest: Effort normal and breath sounds normal. Not tachypneic. No respiratory distress. She has no decreased breath  sounds. She has no wheezes. She has no rhonchi. She has no rales.  Abdominal: Soft. Normal appearance and bowel sounds are normal. She exhibits no distension, no fluid wave, no abdominal bruit  and no mass. There is no hepatosplenomegaly. There is no tenderness. There is no rebound, no guarding and no CVA tenderness. No hernia.  Lymphadenopathy:  She has no cervical adenopathy.  She has no axillary adenopathy.  Neurological: She is alert. She has normal strength. No cranial nerve deficit or sensory deficit.  Skin: Skin is warm, dry and intact. No rash noted.  Pale nail beds. Psychiatric: Her speech is normal and behavior is normal. Judgment and thought content normal. Her mood appears not anxious. Cognition and memory are normal. She does not exhibit a depressed mood.    Diabetic foot exam:  Normal inspection  No skin breakdown  No calluses  Normal DP pulses  Normal sensation to light touch and monofilament  Nails normal

## 2014-12-23 NOTE — Addendum Note (Signed)
Addended by: Baldomero LamyHAVERS, NATASHA C on: 12/23/2014 03:55 PM   Modules accepted: Orders

## 2015-01-02 ENCOUNTER — Other Ambulatory Visit: Payer: BLUE CROSS/BLUE SHIELD

## 2015-01-02 ENCOUNTER — Ambulatory Visit
Admit: 2015-01-02 | Disposition: A | Discharge status: Home / Self Care | Payer: Self-pay | Attending: Hematology and Oncology | Admitting: Hematology and Oncology

## 2015-01-02 DIAGNOSIS — Z1211 Encounter for screening for malignant neoplasm of colon: Secondary | ICD-10-CM

## 2015-01-02 LAB — COMPREHENSIVE METABOLIC PANEL
Albumin: 4.3 g/dL
Alkaline Phosphatase: 72 U/L
Anion Gap: 9 (ref 7–16)
BUN: 16 mg/dL
Bilirubin,Total: 0.4 mg/dL
Calcium, Total: 9.1 mg/dL
Chloride: 103 mmol/L
Co2: 23 mmol/L
Creatinine: 0.56 mg/dL
EGFR (African American): >60
EGFR (Non-African Amer.): >60
Glucose: 94 mg/dL
Potassium: 4.1 mmol/L
SGOT(AST): 15 U/L
SGPT (ALT): 11 U/L — ABNORMAL LOW
Sodium: 135 mmol/L
Total Protein: 7.9 g/dL

## 2015-01-02 LAB — HEMOCCULT SLIDES (X 3 CARDS)
Fecal Occult Blood: NEGATIVE
OCCULT 1: NEGATIVE
OCCULT 2: NEGATIVE
OCCULT 3: NEGATIVE
OCCULT 4: NEGATIVE
OCCULT 5: NEGATIVE

## 2015-01-02 LAB — CBC CANCER CENTER
Basophil #: 0.1 x10 3/mm (ref 0.0–0.1)
Basophil %: 1 %
Eosinophil #: 0 x10 3/mm (ref 0.0–0.7)
Eosinophil %: 0.8 %
HCT: 30 % — ABNORMAL LOW (ref 35.0–47.0)
HGB: 8.9 g/dL — ABNORMAL LOW (ref 12.0–16.0)
Lymphocyte #: 1.2 x10 3/mm (ref 1.0–3.6)
Lymphocyte %: 23.2 %
MCH: 20.9 pg — ABNORMAL LOW (ref 26.0–34.0)
MCHC: 29.6 g/dL — ABNORMAL LOW (ref 32.0–36.0)
MCV: 71 fL — ABNORMAL LOW (ref 80–100)
Monocyte #: 0.4 x10 3/mm (ref 0.2–0.9)
Monocyte %: 7.4 %
Neutrophil #: 3.6 x10 3/mm (ref 1.4–6.5)
Neutrophil %: 67.6 %
Platelet: 350 x10 3/mm (ref 150–440)
RBC: 4.25 10*6/uL (ref 3.80–5.20)
RDW: 20.6 % — ABNORMAL HIGH (ref 11.5–14.5)
WBC: 5.3 x10 3/mm (ref 3.6–11.0)

## 2015-01-02 LAB — IRON AND TIBC
Iron Bind.Cap.(Total): 447 (ref 250–450)
Iron Saturation: 3.1
Iron: 14 ug/dL — ABNORMAL LOW
Unbound Iron-Bind.Cap.: 433

## 2015-01-02 LAB — FOLATE: Folic Acid: 10.5 ng/mL

## 2015-01-02 LAB — TSH: Thyroid Stimulating Horm: 2.007 u[IU]/mL

## 2015-01-02 LAB — FERRITIN: Ferritin (ARMC): 2 ng/mL — ABNORMAL LOW

## 2015-01-09 ENCOUNTER — Ambulatory Visit: Payer: BLUE CROSS/BLUE SHIELD

## 2015-01-16 ENCOUNTER — Inpatient Hospital Stay: Payer: BLUE CROSS/BLUE SHIELD | Attending: Oncology

## 2015-01-16 VITALS — BP 129/71 | HR 85 | Temp 97.8°F | Resp 20

## 2015-01-16 DIAGNOSIS — D509 Iron deficiency anemia, unspecified: Secondary | ICD-10-CM | POA: Diagnosis not present

## 2015-01-16 DIAGNOSIS — D519 Vitamin B12 deficiency anemia, unspecified: Secondary | ICD-10-CM | POA: Insufficient documentation

## 2015-01-16 MED ORDER — CYANOCOBALAMIN 1000 MCG/ML IJ SOLN
1000.0000 ug | Freq: Once | INTRAMUSCULAR | Status: AC
  Administered 2015-01-16: 1000 ug via INTRAMUSCULAR
  Filled 2015-01-16: qty 1

## 2015-01-16 MED ORDER — SODIUM CHLORIDE 0.9 % IV SOLN
Freq: Once | INTRAVENOUS | Status: AC
  Administered 2015-01-16: 16:00:00 via INTRAVENOUS
  Filled 2015-01-16: qty 250

## 2015-01-16 MED ORDER — SODIUM CHLORIDE 0.9 % IV SOLN
200.0000 mg | Freq: Once | INTRAVENOUS | Status: AC
  Administered 2015-01-16: 200 mg via INTRAVENOUS
  Filled 2015-01-16: qty 10

## 2015-01-23 ENCOUNTER — Inpatient Hospital Stay: Payer: BLUE CROSS/BLUE SHIELD

## 2015-01-23 ENCOUNTER — Other Ambulatory Visit: Payer: Self-pay | Admitting: Hematology and Oncology

## 2015-01-23 ENCOUNTER — Encounter (INDEPENDENT_AMBULATORY_CARE_PROVIDER_SITE_OTHER): Payer: Self-pay

## 2015-01-23 DIAGNOSIS — D509 Iron deficiency anemia, unspecified: Secondary | ICD-10-CM | POA: Diagnosis not present

## 2015-01-23 DIAGNOSIS — D518 Other vitamin B12 deficiency anemias: Secondary | ICD-10-CM

## 2015-01-23 MED ORDER — CYANOCOBALAMIN 1000 MCG/ML IJ SOLN
1000.0000 ug | Freq: Once | INTRAMUSCULAR | Status: AC
  Administered 2015-01-23: 1000 ug via INTRAMUSCULAR

## 2015-01-23 MED ORDER — SODIUM CHLORIDE 0.9 % IV SOLN
200.0000 mg | Freq: Once | INTRAVENOUS | Status: AC
  Administered 2015-01-23: 200 mg via INTRAVENOUS
  Filled 2015-01-23: qty 10

## 2015-01-23 MED ORDER — SODIUM CHLORIDE 0.9 % IV SOLN
Freq: Once | INTRAVENOUS | Status: AC
  Administered 2015-01-23: 16:00:00 via INTRAVENOUS
  Filled 2015-01-23: qty 250

## 2015-01-24 ENCOUNTER — Ambulatory Visit: Payer: BLUE CROSS/BLUE SHIELD

## 2015-01-30 ENCOUNTER — Inpatient Hospital Stay: Payer: BLUE CROSS/BLUE SHIELD

## 2015-01-30 VITALS — BP 123/75 | HR 93 | Temp 97.0°F | Resp 20

## 2015-01-30 DIAGNOSIS — D509 Iron deficiency anemia, unspecified: Secondary | ICD-10-CM | POA: Diagnosis not present

## 2015-01-30 MED ORDER — HEPARIN SOD (PORK) LOCK FLUSH 100 UNIT/ML IV SOLN: 500.0000 [IU] | Freq: Once | INTRAVENOUS | Status: DC | PRN

## 2015-01-30 MED ORDER — SODIUM CHLORIDE 0.9 % IJ SOLN
10.0000 mL | INTRAMUSCULAR | Status: DC | PRN
  Filled 2015-01-30: qty 10

## 2015-01-30 MED ORDER — SODIUM CHLORIDE 0.9 % IV SOLN
Freq: Once | INTRAVENOUS | Status: AC
  Administered 2015-01-30: 14:00:00 via INTRAVENOUS
  Filled 2015-01-30: qty 250

## 2015-01-30 MED ORDER — IRON SUCROSE 20 MG/ML IV SOLN
200.0000 mg | Freq: Once | INTRAVENOUS | Status: AC
  Administered 2015-01-30: 200 mg via INTRAVENOUS
  Filled 2015-01-30: qty 10

## 2015-01-30 MED ORDER — SODIUM CHLORIDE 0.9 % IJ SOLN
3.0000 mL | Freq: Once | INTRAMUSCULAR | Status: DC | PRN
  Filled 2015-01-30: qty 10

## 2015-01-30 MED ORDER — CYANOCOBALAMIN 1000 MCG/ML IJ SOLN
1000.0000 ug | Freq: Once | INTRAMUSCULAR | Status: AC
  Administered 2015-01-30: 1000 ug via INTRAMUSCULAR
  Filled 2015-01-30: qty 1

## 2015-02-07 ENCOUNTER — Other Ambulatory Visit: Payer: Self-pay

## 2015-02-07 ENCOUNTER — Inpatient Hospital Stay: Payer: BLUE CROSS/BLUE SHIELD

## 2015-02-07 VITALS — BP 112/72 | HR 74 | Temp 97.0°F | Resp 20

## 2015-02-07 DIAGNOSIS — D509 Iron deficiency anemia, unspecified: Secondary | ICD-10-CM

## 2015-02-07 LAB — CBC
HCT: 36.9 % (ref 35.0–47.0)
Hemoglobin: 11 g/dL — ABNORMAL LOW (ref 12.0–16.0)
MCH: 24.1 pg — ABNORMAL LOW (ref 26.0–34.0)
MCHC: 29.8 g/dL — ABNORMAL LOW (ref 32.0–36.0)
MCV: 80.8 fL (ref 80.0–100.0)
Platelets: 283 10*3/uL (ref 150–440)
RBC: 4.57 MIL/uL (ref 3.80–5.20)
RDW: 31.5 % — ABNORMAL HIGH (ref 11.5–14.5)
WBC: 5.4 10*3/uL (ref 3.6–11.0)

## 2015-02-07 LAB — FERRITIN: Ferritin: 59 ng/mL (ref 11–307)

## 2015-02-07 MED ORDER — HEPARIN SOD (PORK) LOCK FLUSH 100 UNIT/ML IV SOLN: 500.0000 [IU] | Freq: Once | INTRAVENOUS | Status: DC | PRN

## 2015-02-07 MED ORDER — SODIUM CHLORIDE 0.9 % IV SOLN
Freq: Once | INTRAVENOUS | Status: AC
  Administered 2015-02-07: 16:00:00 via INTRAVENOUS
  Filled 2015-02-07: qty 250

## 2015-02-07 MED ORDER — CYANOCOBALAMIN 1000 MCG/ML IJ SOLN
1000.0000 ug | Freq: Once | INTRAMUSCULAR | Status: AC
  Administered 2015-02-07: 1000 ug via INTRAMUSCULAR
  Filled 2015-02-07: qty 1

## 2015-02-07 MED ORDER — IRON SUCROSE 20 MG/ML IV SOLN
200.0000 mg | Freq: Once | INTRAVENOUS | Status: AC
  Administered 2015-02-07: 200 mg via INTRAVENOUS
  Filled 2015-02-07: qty 10

## 2015-02-23 ENCOUNTER — Ambulatory Visit: Payer: BLUE CROSS/BLUE SHIELD

## 2015-03-14 ENCOUNTER — Ambulatory Visit: Payer: BLUE CROSS/BLUE SHIELD

## 2015-03-14 ENCOUNTER — Ambulatory Visit: Payer: BLUE CROSS/BLUE SHIELD | Admitting: Hematology and Oncology

## 2015-03-14 ENCOUNTER — Other Ambulatory Visit: Payer: BLUE CROSS/BLUE SHIELD

## 2015-03-16 ENCOUNTER — Inpatient Hospital Stay: Payer: BLUE CROSS/BLUE SHIELD | Attending: Hematology and Oncology

## 2015-03-16 ENCOUNTER — Inpatient Hospital Stay (HOSPITAL_BASED_OUTPATIENT_CLINIC_OR_DEPARTMENT_OTHER): Payer: BLUE CROSS/BLUE SHIELD | Admitting: Hematology and Oncology

## 2015-03-16 ENCOUNTER — Other Ambulatory Visit: Payer: Self-pay

## 2015-03-16 ENCOUNTER — Inpatient Hospital Stay: Payer: BLUE CROSS/BLUE SHIELD

## 2015-03-16 VITALS — BP 147/85 | HR 84 | Temp 98.3°F | Ht 60.0 in | Wt 131.4 lb

## 2015-03-16 DIAGNOSIS — G4730 Sleep apnea, unspecified: Secondary | ICD-10-CM | POA: Diagnosis not present

## 2015-03-16 DIAGNOSIS — D509 Iron deficiency anemia, unspecified: Secondary | ICD-10-CM

## 2015-03-16 DIAGNOSIS — Z79899 Other long term (current) drug therapy: Secondary | ICD-10-CM | POA: Diagnosis not present

## 2015-03-16 DIAGNOSIS — K219 Gastro-esophageal reflux disease without esophagitis: Secondary | ICD-10-CM

## 2015-03-16 DIAGNOSIS — Z9884 Bariatric surgery status: Secondary | ICD-10-CM

## 2015-03-16 DIAGNOSIS — E119 Type 2 diabetes mellitus without complications: Secondary | ICD-10-CM

## 2015-03-16 DIAGNOSIS — D518 Other vitamin B12 deficiency anemias: Secondary | ICD-10-CM

## 2015-03-16 DIAGNOSIS — E538 Deficiency of other specified B group vitamins: Secondary | ICD-10-CM | POA: Diagnosis not present

## 2015-03-16 DIAGNOSIS — M8580 Other specified disorders of bone density and structure, unspecified site: Secondary | ICD-10-CM | POA: Insufficient documentation

## 2015-03-16 DIAGNOSIS — J45909 Unspecified asthma, uncomplicated: Secondary | ICD-10-CM

## 2015-03-16 DIAGNOSIS — I10 Essential (primary) hypertension: Secondary | ICD-10-CM | POA: Insufficient documentation

## 2015-03-16 LAB — FERRITIN: Ferritin: 10 ng/mL — ABNORMAL LOW (ref 11–307)

## 2015-03-16 LAB — CBC
HCT: 42.8 % (ref 35.0–47.0)
Hemoglobin: 13.6 g/dL (ref 12.0–16.0)
MCH: 28.4 pg (ref 26.0–34.0)
MCHC: 31.8 g/dL — ABNORMAL LOW (ref 32.0–36.0)
MCV: 89.5 fL (ref 80.0–100.0)
Platelets: 252 10*3/uL (ref 150–440)
RBC: 4.79 MIL/uL (ref 3.80–5.20)
RDW: 31.1 % — ABNORMAL HIGH (ref 11.5–14.5)
WBC: 5.4 10*3/uL (ref 3.6–11.0)

## 2015-03-16 LAB — FOLATE: Folate: 13.6 ng/mL (ref >5.9)

## 2015-03-16 LAB — VITAMIN B12: Vitamin B-12: 299 pg/mL (ref 180–914)

## 2015-03-16 MED ORDER — CYANOCOBALAMIN 1000 MCG/ML IJ SOLN
1000.0000 ug | INTRAMUSCULAR | Status: DC
  Administered 2015-03-16: 1000 ug via INTRAMUSCULAR
  Filled 2015-03-16: qty 1

## 2015-03-16 NOTE — Progress Notes (Signed)
Pt here today for follow up and possible Venofer infusion; offers no complaints today

## 2015-03-16 NOTE — Progress Notes (Signed)
[facility]-  Cancer Center  Clinic day:  03/16/2015  Chief Complaint: Shauntia Levengood is a 49 y.o. female status post gastric bypass surgery with iron deficiency anemia and B12 deficiency who is seen for reassessment after initiation of IV iron and B12.  HPI: The patient was last seen in medical oncology clinic on 01/02/2015. At that time, she was seen for intial consultation. CBC revealed a hematocrit 30, hemoglobin 8.9, MCV 71, platelets 3000, white count 5300 with an ANC 3600.  Comprehensive metabolic panel 0.56, calcium 9.1, albumen 4.3 and normal liver function tests. Iron studies confirmed iron deficiency anemia with a ferritin of 2, iron saturation of 3.1%, and TIBC 7. Folate was 10.5.  B12 was 119 (low) on 12/09/2014.  Stool guaiacs were negative.  During the interim, the patient received 4 infusions of Venofer of 200 mg weekly from 01/16/2015 until 02/07/2015. She has received B12 weekly from 01/16/2015 until 02/07/2015.  She tolerated her infusions well.   Symptomatically, she is sleeping better. 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  She typically eats ice 3-4 times a day as compared to eating bags of ice.  Energy level fluctuates (good and bad days).  She states that she has a "lazy streak".  She is taking a lot of BCs for headaches. She denies any numbness, weakness, balance or coordination issues.  Past Medical History  Diagnosis Date  . Anxiety   . Asthma   . Diabetes mellitus type II   . GERD (gastroesophageal reflux disease)   . Hypertension   . Osteopenia   . Obesity   . Sleep apnea   . Vitamin B12 deficiency     since bariatric surgery  . 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 Anemia     Past Surgical History  Procedure Laterality Date  . Gastric bypass  03/17/02    Family History  Problem Relation Age of Onset  . Heart disease Mother     valve replacement surgery  . Coronary artery disease Father     Social History:  reports that she has quit smoking. She has never  used smokeless tobacco. She reports that she does not drink alcohol or use illicit drugs.  The patient is alone today.  Allergies:  Allergies  Allergen Reactions  . Codeine Sulfate   . Metformin   . Penicillins     REACTION: questionable    Current Medications: Current Outpatient Prescriptions  Medication Sig Dispense Refill  . albuterol (PROVENTIL,VENTOLIN) 90 MCG/ACT inhaler Inhale 2 puffs into the lungs every 6 (six) hours as needed for wheezing. Use 2 puffs 4 times a day as needed. 17 g 2  . calcium citrate-vitamin D (CITRACAL+D) 315-200 MG-UNIT per tablet Take 1 tablet by mouth daily.      . cyanocobalamin (,VITAMIN B-12,) 1000 MCG/ML injection Inject into the muscle. One injection every 6 months.     Marland Kitchen rOPINIRole (REQUIP) 1 MG tablet Use in the evening for restless leg as needed in addition to bedtime dose of 3 mg daily. 30 tablet 11  . rOPINIRole (REQUIP) 3 MG tablet Take 1 tablet (3 mg total) by mouth at bedtime. 30 tablet 11  . triamcinolone (AZMACORT) 75 MCG/ACT inhaler Inhale 2 puffs into the lungs as needed.    . cetirizine (ZYRTEC) 10 MG tablet Take 10 mg by mouth as needed.     . ferrous gluconate (FERGON) 225 (27 FE) MG tablet Take 1 tablet (240 mg total) by mouth 3 (three) times daily with meals. (Patient  not taking: Reported on 03/16/2015) 90 each 3  . ferrous sulfate 325 (65 FE) MG tablet Take 1 tablet (325 mg total) by mouth daily with breakfast. (Patient not taking: Reported on 03/16/2015) 60 tablet 11  . Multiple Vitamin (MULTIVITAMIN) tablet Take 1 tablet by mouth daily.       No current facility-administered medications for this visit.    Review of Systems:  GENERAL: Good and bad days.  N fevers, sweats or weight loss. PERFORMANCE STATUS (ECOG):  1 HEENT:  Ear sensation (pressure/pops).  No visual changes.  No sore throat, runny nose, sore throat, mouth sores or tenderness. Lungs: No shortness of breath or cough.  No hemoptysis. Cardiac:  No chest pain,  palpitations, orthopnea, or PND. GI:  Ice pica has improved.  rrhea, constipation, melena or hematochezia. GU:  No urgency, frequency, dysuria, or hematuria. Musculoskeletal:  No back pain.  No joint pain.  No muscle tenderness. Extremities:  Feet feel cold, burning, stinging.  Sometimes feet swell.  No change in color. Skin:  No rashes or skin changes. Neuro:  Headaches, taking BC powders.  No numbness or weakness, balance or coordination issues. Endocrine:  No diabetes, thyroid issues, hot flashes or night sweats. Psych:  No mood changes, depression or anxiety.  Sleeping better. Pain:  No focal pain. Review of systems:  All other systems reviewed and found to be negative.   Physical Exam: Blood pressure 147/85, pulse 84, temperature 98.3 F (36.8 C), temperature source Tympanic, height 5' (1.524 m), weight 131 lb 6.3 oz (59.6 kg). GENERAL:  Well developed, well nourished, sitting comfortably in the exam room in no acute distress. MENTAL STATUS:  Alert and oriented to person, place and time. HEAD:  Short blonde hair.  Normocephalic, atraumatic, face symmetric, no Cushingoid features. EYES:  Green eyes.  Pupils equal round and reactive to light and accomodation.  No conjunctivitis or scleral icterus. ENT:  Oropharynx clear without lesion.  Tongue normal. Mucous membranes moist.  RESPIRATORY:  Clear to auscultation without rales, wheezes or rhonchi. CARDIOVASCULAR:  Regular rate and rhythm without murmur, rub or gallop. ABDOMEN:  Soft, non-tender, with active bowel sounds, and no hepatosplenomegaly.  No masses. SKIN:  No rashes, ulcers or lesions. EXTREMITIES: No edema, no skin discoloration or tenderness.  No palpable cords. LYMPH NODES: No palpable cervical, supraclavicular, axillary or inguinal adenopathy  NEUROLOGICAL: Unremarkable. PSYCH:  Appropriate.   Appointment on 03/16/2015  Component Date Value Ref Range Status  . WBC 03/16/2015 5.4  3.6 - 11.0 K/uL Final  . RBC 03/16/2015  4.79  3.80 - 5.20 MIL/uL Final  . Hemoglobin 03/16/2015 13.6  12.0 - 16.0 g/dL Final  . HCT 45/40/981107/03/2015 42.8  35.0 - 47.0 % Final  . MCV 03/16/2015 89.5  80.0 - 100.0 fL Final  . MCH 03/16/2015 28.4  26.0 - 34.0 pg Final  . MCHC 03/16/2015 31.8* 32.0 - 36.0 g/dL Final  . RDW 91/47/829507/03/2015 31.1* 11.5 - 14.5 % Final  . Platelets 03/16/2015 252  150 - 440 K/uL Final    Assessment:  Belinda Blockeresa Carol Shanholtzer is a 49 y.o. female status post gastric bypass surgery in 03/2002 and subsequent development of iron deficiency anemia and B12 deficiency.  She is intolerant of oral iron.  She denies any melena or hematochezia.  Stool guaiacs were negative on 01/02/2015.  She received her 4 infusions of Venofer (05/09 - 02/07/2015). She has received B12 weekly 4 (05/09 - 02/07/2015). Hematocrit has  improved from 30.0 to 42.8.  MCV  is normal.  Symptomatically she is sleeping better. Her ice pica has improved dramatically. She is using excess BC powders for headaches.  Exam is stable.  Plan: 1. Labs today:  CBC with diff, ferritin, folate, B12. 2. B12 today. 3. GI consult- iron deficiency anemia s/p gastric surgery; excess BC powder use; age 74 screening colonoscopy. 4. RTC monthly for B12. 5. RTC in 3 months for MD assessment and labs (CBC with diff, ferritin, B12)   Rosey Bath, MD  03/16/2015, 2:05 PM

## 2015-03-21 ENCOUNTER — Telehealth: Payer: Self-pay | Admitting: Family Medicine

## 2015-03-21 DIAGNOSIS — E7800 Pure hypercholesterolemia, unspecified: Secondary | ICD-10-CM

## 2015-03-21 DIAGNOSIS — D518 Other vitamin B12 deficiency anemias: Secondary | ICD-10-CM

## 2015-03-21 DIAGNOSIS — D509 Iron deficiency anemia, unspecified: Secondary | ICD-10-CM

## 2015-03-21 DIAGNOSIS — E119 Type 2 diabetes mellitus without complications: Secondary | ICD-10-CM

## 2015-03-21 DIAGNOSIS — M8580 Other specified disorders of bone density and structure, unspecified site: Secondary | ICD-10-CM

## 2015-03-21 NOTE — Telephone Encounter (Signed)
-----   Message from Alvina Chouerri J Walsh sent at 03/15/2015  3:39 PM EDT ----- Regarding: Lab orders for Wednesday,7.13.16 Patient is scheduled for CPX labs, please order future labs, Thanks , Camelia Engerri

## 2015-03-22 ENCOUNTER — Other Ambulatory Visit: Payer: BLUE CROSS/BLUE SHIELD

## 2015-03-24 ENCOUNTER — Ambulatory Visit: Payer: BLUE CROSS/BLUE SHIELD | Admitting: Family Medicine

## 2015-03-24 ENCOUNTER — Ambulatory Visit: Payer: BLUE CROSS/BLUE SHIELD

## 2015-03-24 DIAGNOSIS — Z0289 Encounter for other administrative examinations: Secondary | ICD-10-CM

## 2015-04-17 ENCOUNTER — Inpatient Hospital Stay: Payer: BLUE CROSS/BLUE SHIELD | Attending: Hematology and Oncology

## 2015-04-17 VITALS — BP 134/85 | HR 82 | Resp 20

## 2015-04-17 DIAGNOSIS — D518 Other vitamin B12 deficiency anemias: Secondary | ICD-10-CM | POA: Diagnosis not present

## 2015-04-17 DIAGNOSIS — D509 Iron deficiency anemia, unspecified: Secondary | ICD-10-CM | POA: Insufficient documentation

## 2015-04-17 MED ORDER — CYANOCOBALAMIN 1000 MCG/ML IJ SOLN
1000.0000 ug | INTRAMUSCULAR | Status: DC
  Administered 2015-04-17: 1000 ug via INTRAMUSCULAR
  Filled 2015-04-17: qty 1

## 2015-05-18 ENCOUNTER — Inpatient Hospital Stay: Payer: BLUE CROSS/BLUE SHIELD | Attending: Hematology and Oncology

## 2015-05-18 DIAGNOSIS — D509 Iron deficiency anemia, unspecified: Secondary | ICD-10-CM | POA: Insufficient documentation

## 2015-05-18 DIAGNOSIS — E538 Deficiency of other specified B group vitamins: Secondary | ICD-10-CM | POA: Diagnosis not present

## 2015-05-18 DIAGNOSIS — Z9884 Bariatric surgery status: Secondary | ICD-10-CM | POA: Diagnosis not present

## 2015-05-18 MED ORDER — CYANOCOBALAMIN 1000 MCG/ML IJ SOLN
1000.0000 ug | INTRAMUSCULAR | Status: DC
  Administered 2015-05-18: 1000 ug via INTRAMUSCULAR
  Filled 2015-05-18: qty 1

## 2015-05-30 ENCOUNTER — Encounter: Payer: Self-pay | Admitting: Hematology and Oncology

## 2015-06-12 ENCOUNTER — Other Ambulatory Visit: Payer: Self-pay

## 2015-06-16 ENCOUNTER — Inpatient Hospital Stay: Payer: BLUE CROSS/BLUE SHIELD | Attending: Hematology and Oncology

## 2015-06-16 ENCOUNTER — Inpatient Hospital Stay (HOSPITAL_BASED_OUTPATIENT_CLINIC_OR_DEPARTMENT_OTHER): Payer: BLUE CROSS/BLUE SHIELD | Admitting: Hematology and Oncology

## 2015-06-16 ENCOUNTER — Inpatient Hospital Stay: Payer: BLUE CROSS/BLUE SHIELD

## 2015-06-16 VITALS — BP 149/89 | HR 84 | Temp 98.2°F | Wt 131.2 lb

## 2015-06-16 DIAGNOSIS — Z79899 Other long term (current) drug therapy: Secondary | ICD-10-CM | POA: Insufficient documentation

## 2015-06-16 DIAGNOSIS — Z9884 Bariatric surgery status: Secondary | ICD-10-CM | POA: Insufficient documentation

## 2015-06-16 DIAGNOSIS — R51 Headache: Secondary | ICD-10-CM

## 2015-06-16 DIAGNOSIS — Z87891 Personal history of nicotine dependence: Secondary | ICD-10-CM

## 2015-06-16 DIAGNOSIS — K219 Gastro-esophageal reflux disease without esophagitis: Secondary | ICD-10-CM | POA: Diagnosis not present

## 2015-06-16 DIAGNOSIS — E119 Type 2 diabetes mellitus without complications: Secondary | ICD-10-CM | POA: Insufficient documentation

## 2015-06-16 DIAGNOSIS — I10 Essential (primary) hypertension: Secondary | ICD-10-CM

## 2015-06-16 DIAGNOSIS — G4730 Sleep apnea, unspecified: Secondary | ICD-10-CM

## 2015-06-16 DIAGNOSIS — J45909 Unspecified asthma, uncomplicated: Secondary | ICD-10-CM

## 2015-06-16 DIAGNOSIS — E538 Deficiency of other specified B group vitamins: Secondary | ICD-10-CM | POA: Insufficient documentation

## 2015-06-16 DIAGNOSIS — D509 Iron deficiency anemia, unspecified: Secondary | ICD-10-CM

## 2015-06-16 DIAGNOSIS — F5089 Other specified eating disorder: Secondary | ICD-10-CM | POA: Diagnosis not present

## 2015-06-16 DIAGNOSIS — D518 Other vitamin B12 deficiency anemias: Secondary | ICD-10-CM

## 2015-06-16 LAB — CBC WITH DIFFERENTIAL/PLATELET
Basophils Absolute: 0.1 10*3/uL (ref 0–0.1)
Basophils Relative: 1 %
Eosinophils Absolute: 0.1 10*3/uL (ref 0–0.7)
Eosinophils Relative: 2 %
HCT: 41.8 % (ref 35.0–47.0)
Hemoglobin: 14 g/dL (ref 12.0–16.0)
Lymphocytes Relative: 17 %
Lymphs Abs: 0.9 10*3/uL — ABNORMAL LOW (ref 1.0–3.6)
MCH: 32.5 pg (ref 26.0–34.0)
MCHC: 33.4 g/dL (ref 32.0–36.0)
MCV: 97.5 fL (ref 80.0–100.0)
Monocytes Absolute: 0.5 10*3/uL (ref 0.2–0.9)
Monocytes Relative: 8 %
Neutro Abs: 4.1 10*3/uL (ref 1.4–6.5)
Neutrophils Relative %: 72 %
Platelets: 277 10*3/uL (ref 150–440)
RBC: 4.29 MIL/uL (ref 3.80–5.20)
RDW: 16.9 % — ABNORMAL HIGH (ref 11.5–14.5)
WBC: 5.7 10*3/uL (ref 3.6–11.0)

## 2015-06-16 LAB — FERRITIN: Ferritin: 6 ng/mL — ABNORMAL LOW (ref 11–307)

## 2015-06-16 MED ORDER — HEPARIN SOD (PORK) LOCK FLUSH 100 UNIT/ML IV SOLN: 500.0000 [IU] | Freq: Once | INTRAVENOUS | Status: AC | PRN

## 2015-06-16 MED ORDER — SODIUM CHLORIDE 0.9 % IJ SOLN
10.0000 mL | INTRAMUSCULAR | Status: AC | PRN
  Filled 2015-06-16: qty 10

## 2015-06-16 MED ORDER — HEPARIN SOD (PORK) LOCK FLUSH 100 UNIT/ML IV SOLN: 250.0000 [IU] | Freq: Once | INTRAVENOUS | Status: AC | PRN

## 2015-06-16 MED ORDER — CYANOCOBALAMIN 1000 MCG/ML IJ SOLN
1000.0000 ug | INTRAMUSCULAR | Status: DC
Start: 2015-06-16 — End: 2015-06-16

## 2015-06-16 MED ORDER — ALTEPLASE 2 MG IJ SOLR: 2.0000 mg | Freq: Once | INTRAMUSCULAR | Status: AC | PRN

## 2015-06-16 MED ORDER — CYANOCOBALAMIN 1000 MCG/ML IJ SOLN
1000.0000 ug | Freq: Once | INTRAMUSCULAR | Status: AC
  Administered 2015-06-16: 1000 ug via INTRAMUSCULAR
  Filled 2015-06-16: qty 1

## 2015-06-16 MED ORDER — SODIUM CHLORIDE 0.9 % IJ SOLN
3.0000 mL | Freq: Once | INTRAMUSCULAR | Status: AC | PRN
  Filled 2015-06-16: qty 10

## 2015-06-16 NOTE — Progress Notes (Signed)
Patient has no concerns today. 

## 2015-06-16 NOTE — Progress Notes (Signed)
[facility]-  Cancer Center  Clinic day:  06/16/2015  Chief Complaint: Brenda Hawkins is a 49 y.o. female status post gastric bypass surgery with iron deficiency anemia and B12 deficiency who is seen for 3 month assessment after completion of IV iron and continuation of monthly B12.  HPI: The patient was last seen in medical oncology clinic on 03/16/2015. At that time, her ice pica had dramatically improved.  She was still using excess BC powders for headaches.  CBC included a hematocrit of 42.8, hemoglobin 13.6 and MCV 89.5.  Ferritin was 10.  B12 was 299.  Folate was 13.6.  She received B12. She was referred to GI for screening colonoscopy and possible EGD.  She received B12 on 04/17/2015 and 05/18/2015.  She has not had a colonoscopy. She states that she had an appointment but "chickened out". She still using BC powders.  Her ice pica is better.  She is still eating ice, but only a bag a week instead of a bag a day.  She is off of her restless-like medications for the past 2 months.  She feels that she may be getting a cold again.  Energy level is modest.  She describes sitting on the couch or laying in bed.  Past Medical History  Diagnosis Date  . Anxiety   . Asthma   . Diabetes mellitus type II   . GERD (gastroesophageal reflux disease)   . Hypertension   . Osteopenia   . Obesity   . Sleep apnea   . Vitamin B12 deficiency     since bariatric surgery  . 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 Anemia     Past Surgical History  Procedure Laterality Date  . Gastric bypass  03/17/02    Family History  Problem Relation Age of Onset  . Heart disease Mother     valve replacement surgery  . Coronary artery disease Father     Social History:  reports that she has quit smoking. She has never used smokeless tobacco. She reports that she does not drink alcohol or use illicit drugs.  The patient is alone today.  Allergies:  Allergies  Allergen Reactions  . Codeine Sulfate   .  Penicillins     REACTION: questionable    Current Medications: Current Outpatient Prescriptions  Medication Sig Dispense Refill  . albuterol (PROVENTIL,VENTOLIN) 90 MCG/ACT inhaler Inhale 2 puffs into the lungs every 6 (six) hours as needed for wheezing. Use 2 puffs 4 times a day as needed. 17 g 2  . cyanocobalamin (,VITAMIN B-12,) 1000 MCG/ML injection Inject into the muscle. One injection every 6 months.     . Multiple Vitamin (MULTIVITAMIN) tablet Take 1 tablet by mouth daily.      Marland Kitchen triamcinolone (AZMACORT) 75 MCG/ACT inhaler Inhale 2 puffs into the lungs as needed.    . calcium citrate-vitamin D (CITRACAL+D) 315-200 MG-UNIT per tablet Take 1 tablet by mouth daily.      . cetirizine (ZYRTEC) 10 MG tablet Take 10 mg by mouth as needed.     . ferrous gluconate (FERGON) 225 (27 FE) MG tablet Take 1 tablet (240 mg total) by mouth 3 (three) times daily with meals. (Patient not taking: Reported on 03/16/2015) 90 each 3  . ferrous sulfate 325 (65 FE) MG tablet Take 1 tablet (325 mg total) by mouth daily with breakfast. (Patient not taking: Reported on 03/16/2015) 60 tablet 11  . rOPINIRole (REQUIP) 1 MG tablet Use in the evening for restless leg  as needed in addition to bedtime dose of 3 mg daily. (Patient not taking: Reported on 06/16/2015) 30 tablet 11  . rOPINIRole (REQUIP) 3 MG tablet Take 1 tablet (3 mg total) by mouth at bedtime. (Patient not taking: Reported on 06/16/2015) 30 tablet 11   No current facility-administered medications for this visit.    Review of Systems:  GENERAL: Energy level modest.  N fevers, sweats or weight loss. PERFORMANCE STATUS (ECOG):  1 HEENT: No visual changes.  Possible URI.  Fluid in ears.  No sore throat, runny nose, sore throat, mouth sores or tenderness. Lungs: No shortness of breath or cough.  No hemoptysis. Cardiac:  No chest pain, palpitations, orthopnea, or PND. GI:  Ice pica improved (bag a week rather than a bag a day).  No diarrhea, constipation, melena  or hematochezia. GU:  No urgency, frequency, dysuria, or hematuria. Musculoskeletal:  No back pain.  No joint pain.  No muscle tenderness. Extremities:  Feet feel cold, burning, stinging.  Sometimes feet swell.  No change in color. Skin:  No rashes or skin changes. Neuro:  Headaches, taking BC powders.  Restless leg, improved.  No numbness or weakness, balance or coordination issues. Endocrine:  No diabetes, thyroid issues, hot flashes or night sweats. Psych:  No mood changes, depression or anxiety.  Sleeping better. Pain:  No focal pain. Review of systems:  All other systems reviewed and found to be negative.   Physical Exam: Blood pressure 149/89, pulse 84, temperature 98.2 F (36.8 C), temperature source Tympanic, weight 131 lb 2.8 oz (59.5 kg). GENERAL:  Well developed, well nourished, sitting comfortably in the exam room in no acute distress. MENTAL STATUS:  Alert and oriented to person, place and time. HEAD:  Short curly blonde hair.  Normocephalic, atraumatic, face symmetric, no Cushingoid features. EYES:  Blue/green eyes.  Pupils equal round and reactive to light and accomodation.  No conjunctivitis or scleral icterus. ENT:  Oropharynx clear without lesion.  Tongue normal. Mucous membranes moist.  RESPIRATORY:  Clear to auscultation without rales, wheezes or rhonchi. CARDIOVASCULAR:  Regular rate and rhythm without murmur, rub or gallop. ABDOMEN:  Soft, non-tender, with active bowel sounds, and no hepatosplenomegaly.  No masses. SKIN:  No rashes, ulcers or lesions. EXTREMITIES: No edema, no skin discoloration or tenderness.  No palpable cords. LYMPH NODES: No palpable cervical, supraclavicular, axillary or inguinal adenopathy  NEUROLOGICAL: Unremarkable. PSYCH:  Appropriate.   Appointment on 06/16/2015  Component Date Value Ref Range Status  . WBC 06/16/2015 5.7  3.6 - 11.0 K/uL Final  . RBC 06/16/2015 4.29  3.80 - 5.20 MIL/uL Final  . Hemoglobin 06/16/2015 14.0  12.0 - 16.0  g/dL Final  . HCT 96/12/5407 41.8  35.0 - 47.0 % Final  . MCV 06/16/2015 97.5  80.0 - 100.0 fL Final  . MCH 06/16/2015 32.5  26.0 - 34.0 pg Final  . MCHC 06/16/2015 33.4  32.0 - 36.0 g/dL Final  . RDW 81/19/1478 16.9* 11.5 - 14.5 % Final  . Platelets 06/16/2015 277  150 - 440 K/uL Final  . Neutrophils Relative % 06/16/2015 72   Final  . Neutro Abs 06/16/2015 4.1  1.4 - 6.5 K/uL Final  . Lymphocytes Relative 06/16/2015 17   Final  . Lymphs Abs 06/16/2015 0.9* 1.0 - 3.6 K/uL Final  . Monocytes Relative 06/16/2015 8   Final  . Monocytes Absolute 06/16/2015 0.5  0.2 - 0.9 K/uL Final  . Eosinophils Relative 06/16/2015 2   Final  . Eosinophils Absolute  06/16/2015 0.1  0 - 0.7 K/uL Final  . Basophils Relative 06/16/2015 1   Final  . Basophils Absolute 06/16/2015 0.1  0 - 0.1 K/uL Final    Assessment:  Brenda Hawkins is a 49 y.o. female status post gastric bypass surgery in 03/2002 and subsequent development of iron deficiency anemia and B12 deficiency.  She is intolerant of oral iron.  She denies any melena or hematochezia.  Stool guaiacs were negative on 01/02/2015.  She received her 4 infusions of Venofer (05/09 - 02/07/2015). She receives B12 (began 01/16/2015; last 05/18/2015). Hematocrit improved from 30.0 to 41.8.  MCV is normal.  She missed her GI appointment.  Symptomatically, energy level is modest. Her ice pica has improved. She is using excess BC powders for headaches.  Exam is stable.  Plan: 1. Labs today:  CBC with diff, ferritin. 2. B12 today and monthly. 3. Patient to reschedule appointment with GI- iron deficiency anemia s/p gastric surgery; excess BC powder use; age 9 screening colonoscopy. 4. RTC in 3 months for MD assessment, labs (CBC with diff, ferritin, iron studies), and B12.   Rosey Bath, MD  06/16/2015, 2:41 PM

## 2015-07-17 ENCOUNTER — Inpatient Hospital Stay: Payer: BLUE CROSS/BLUE SHIELD | Attending: Hematology and Oncology

## 2015-07-17 DIAGNOSIS — Z9884 Bariatric surgery status: Secondary | ICD-10-CM | POA: Insufficient documentation

## 2015-07-17 DIAGNOSIS — E538 Deficiency of other specified B group vitamins: Secondary | ICD-10-CM | POA: Insufficient documentation

## 2015-07-17 DIAGNOSIS — D509 Iron deficiency anemia, unspecified: Secondary | ICD-10-CM | POA: Insufficient documentation

## 2015-07-17 MED ORDER — CYANOCOBALAMIN 1000 MCG/ML IJ SOLN
1000.0000 ug | Freq: Once | INTRAMUSCULAR | Status: AC
  Administered 2015-07-17: 1000 ug via INTRAMUSCULAR
  Filled 2015-07-17: qty 1

## 2015-08-14 ENCOUNTER — Other Ambulatory Visit: Payer: Self-pay | Admitting: Hematology and Oncology

## 2015-08-14 ENCOUNTER — Inpatient Hospital Stay: Payer: BLUE CROSS/BLUE SHIELD | Attending: Hematology and Oncology

## 2015-08-14 DIAGNOSIS — E538 Deficiency of other specified B group vitamins: Secondary | ICD-10-CM | POA: Insufficient documentation

## 2015-08-14 DIAGNOSIS — D509 Iron deficiency anemia, unspecified: Secondary | ICD-10-CM | POA: Insufficient documentation

## 2015-08-14 DIAGNOSIS — Z9884 Bariatric surgery status: Secondary | ICD-10-CM | POA: Diagnosis not present

## 2015-08-14 MED ORDER — CYANOCOBALAMIN 1000 MCG/ML IJ SOLN
1000.0000 ug | Freq: Once | INTRAMUSCULAR | Status: AC
  Administered 2015-08-14: 1000 ug via INTRAMUSCULAR
  Filled 2015-08-14: qty 1

## 2015-09-11 ENCOUNTER — Inpatient Hospital Stay: Payer: BLUE CROSS/BLUE SHIELD

## 2015-09-12 ENCOUNTER — Inpatient Hospital Stay: Payer: BLUE CROSS/BLUE SHIELD | Attending: Hematology and Oncology

## 2015-09-12 DIAGNOSIS — Z79899 Other long term (current) drug therapy: Secondary | ICD-10-CM | POA: Insufficient documentation

## 2015-09-12 DIAGNOSIS — G4730 Sleep apnea, unspecified: Secondary | ICD-10-CM | POA: Insufficient documentation

## 2015-09-12 DIAGNOSIS — J45909 Unspecified asthma, uncomplicated: Secondary | ICD-10-CM | POA: Insufficient documentation

## 2015-09-12 DIAGNOSIS — E538 Deficiency of other specified B group vitamins: Secondary | ICD-10-CM | POA: Insufficient documentation

## 2015-09-12 DIAGNOSIS — R51 Headache: Secondary | ICD-10-CM | POA: Insufficient documentation

## 2015-09-12 DIAGNOSIS — Z87891 Personal history of nicotine dependence: Secondary | ICD-10-CM | POA: Insufficient documentation

## 2015-09-12 DIAGNOSIS — K219 Gastro-esophageal reflux disease without esophagitis: Secondary | ICD-10-CM | POA: Insufficient documentation

## 2015-09-12 DIAGNOSIS — E119 Type 2 diabetes mellitus without complications: Secondary | ICD-10-CM | POA: Insufficient documentation

## 2015-09-12 DIAGNOSIS — Z9884 Bariatric surgery status: Secondary | ICD-10-CM | POA: Insufficient documentation

## 2015-09-12 DIAGNOSIS — D509 Iron deficiency anemia, unspecified: Secondary | ICD-10-CM | POA: Insufficient documentation

## 2015-09-12 DIAGNOSIS — F5089 Other specified eating disorder: Secondary | ICD-10-CM | POA: Insufficient documentation

## 2015-09-12 DIAGNOSIS — I10 Essential (primary) hypertension: Secondary | ICD-10-CM | POA: Insufficient documentation

## 2015-09-17 ENCOUNTER — Other Ambulatory Visit: Payer: Self-pay | Admitting: Hematology and Oncology

## 2015-09-18 ENCOUNTER — Inpatient Hospital Stay: Payer: BLUE CROSS/BLUE SHIELD

## 2015-09-18 ENCOUNTER — Inpatient Hospital Stay: Payer: BLUE CROSS/BLUE SHIELD | Admitting: Hematology and Oncology

## 2015-10-08 ENCOUNTER — Encounter: Payer: Self-pay | Admitting: Hematology and Oncology

## 2015-10-09 ENCOUNTER — Inpatient Hospital Stay: Payer: BLUE CROSS/BLUE SHIELD

## 2015-10-09 ENCOUNTER — Other Ambulatory Visit: Payer: Self-pay

## 2015-10-09 ENCOUNTER — Inpatient Hospital Stay (HOSPITAL_BASED_OUTPATIENT_CLINIC_OR_DEPARTMENT_OTHER): Payer: BLUE CROSS/BLUE SHIELD | Admitting: Hematology and Oncology

## 2015-10-09 ENCOUNTER — Other Ambulatory Visit: Payer: Self-pay | Admitting: Hematology and Oncology

## 2015-10-09 VITALS — BP 142/85 | HR 68 | Temp 99.6°F | Resp 18 | Ht 60.0 in | Wt 129.0 lb

## 2015-10-09 DIAGNOSIS — J45909 Unspecified asthma, uncomplicated: Secondary | ICD-10-CM | POA: Diagnosis not present

## 2015-10-09 DIAGNOSIS — R634 Abnormal weight loss: Secondary | ICD-10-CM

## 2015-10-09 DIAGNOSIS — Z87891 Personal history of nicotine dependence: Secondary | ICD-10-CM | POA: Diagnosis not present

## 2015-10-09 DIAGNOSIS — R51 Headache: Secondary | ICD-10-CM

## 2015-10-09 DIAGNOSIS — Z9884 Bariatric surgery status: Secondary | ICD-10-CM

## 2015-10-09 DIAGNOSIS — F5089 Other specified eating disorder: Secondary | ICD-10-CM | POA: Diagnosis not present

## 2015-10-09 DIAGNOSIS — E119 Type 2 diabetes mellitus without complications: Secondary | ICD-10-CM | POA: Diagnosis not present

## 2015-10-09 DIAGNOSIS — D518 Other vitamin B12 deficiency anemias: Secondary | ICD-10-CM

## 2015-10-09 DIAGNOSIS — Z79899 Other long term (current) drug therapy: Secondary | ICD-10-CM | POA: Diagnosis not present

## 2015-10-09 DIAGNOSIS — I10 Essential (primary) hypertension: Secondary | ICD-10-CM | POA: Diagnosis not present

## 2015-10-09 DIAGNOSIS — D509 Iron deficiency anemia, unspecified: Secondary | ICD-10-CM | POA: Diagnosis not present

## 2015-10-09 DIAGNOSIS — E538 Deficiency of other specified B group vitamins: Secondary | ICD-10-CM

## 2015-10-09 DIAGNOSIS — K219 Gastro-esophageal reflux disease without esophagitis: Secondary | ICD-10-CM | POA: Diagnosis not present

## 2015-10-09 DIAGNOSIS — G4730 Sleep apnea, unspecified: Secondary | ICD-10-CM | POA: Diagnosis not present

## 2015-10-09 LAB — CBC WITH DIFFERENTIAL/PLATELET
Basophils Absolute: 0.1 10*3/uL (ref 0–0.1)
Basophils Relative: 1 %
Eosinophils Absolute: 0.1 10*3/uL (ref 0–0.7)
Eosinophils Relative: 2 %
HCT: 40.1 % (ref 35.0–47.0)
Hemoglobin: 13.7 g/dL (ref 12.0–16.0)
Lymphocytes Relative: 23 %
Lymphs Abs: 1.1 10*3/uL (ref 1.0–3.6)
MCH: 32.6 pg (ref 26.0–34.0)
MCHC: 34 g/dL (ref 32.0–36.0)
MCV: 95.8 fL (ref 80.0–100.0)
Monocytes Absolute: 0.4 10*3/uL (ref 0.2–0.9)
Monocytes Relative: 8 %
Neutro Abs: 3.2 10*3/uL (ref 1.4–6.5)
Neutrophils Relative %: 66 %
Platelets: 268 10*3/uL (ref 150–440)
RBC: 4.19 MIL/uL (ref 3.80–5.20)
RDW: 16.9 % — ABNORMAL HIGH (ref 11.5–14.5)
WBC: 4.8 10*3/uL (ref 3.6–11.0)

## 2015-10-09 LAB — IRON AND TIBC
Iron: 40 ug/dL (ref 28–170)
Saturation Ratios: 10 % — ABNORMAL LOW (ref 10.4–31.8)
TIBC: 384 ug/dL (ref 250–450)
UIBC: 344 ug/dL

## 2015-10-09 LAB — FERRITIN: Ferritin: 5 ng/mL — ABNORMAL LOW (ref 11–307)

## 2015-10-09 NOTE — Progress Notes (Signed)
[facility]-  Cancer Center  Clinic day:  10/09/2015  Chief Complaint: Brenda Hawkins is a 50 y.o. female status post gastric bypass surgery with iron deficiency anemia and B12 deficiency who is seen for 3 month assessment.  HPI: The patient was last seen in medical oncology clinic on 06/16/2015.  At that time, energy level was modest.  Her ice pica had improved. She was using excess BC powders for headaches.  Exam was stable.  Hematocrit was 41.8, hemoglobin 14.0 with an MCV of 97.5.  Ferritin was 6.  During the interim, she has received her B12 (last 08/14/2015).  She states that she is doing well. She feels better now than she did a year and a half ago. She describes herself as a "whole new person".  Before, she was eating her freezer out of ice.   She is still eating ice, but much less.   When discussing a colonoscopy, she states that she is not concerned about a GI malignancy, but concerned about a stroke or heart disease. She is losing weight.  She states that she typically loses weight before she becomes anemic.  She denies any melena or hematochezia.  Past Medical History  Diagnosis Date  . Anxiety   . Asthma   . Diabetes mellitus type II   . GERD (gastroesophageal reflux disease)   . Hypertension   . Osteopenia   . Obesity   . Sleep apnea   . Vitamin B12 deficiency     since bariatric surgery  . 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 Anemia     Past Surgical History  Procedure Laterality Date  . Gastric bypass  03/17/02    Family History  Problem Relation Age of Onset  . Heart disease Mother     valve replacement surgery  . Coronary artery disease Father     Social History:  reports that she has quit smoking. She has never used smokeless tobacco. She reports that she does not drink alcohol or use illicit drugs.  The patient is alone today.  Allergies:  Allergies  Allergen Reactions  . Codeine Sulfate   . Penicillins     REACTION: questionable    Current  Medications: Current Outpatient Prescriptions  Medication Sig Dispense Refill  . cyanocobalamin (,VITAMIN B-12,) 1000 MCG/ML injection Inject into the muscle. One injection every 6 months.     Marland Kitchen albuterol (PROVENTIL,VENTOLIN) 90 MCG/ACT inhaler Inhale 2 puffs into the lungs every 6 (six) hours as needed for wheezing. Use 2 puffs 4 times a day as needed. (Patient not taking: Reported on 10/09/2015) 17 g 2  . calcium citrate-vitamin D (CITRACAL+D) 315-200 MG-UNIT per tablet Take 1 tablet by mouth daily. Reported on 10/09/2015    . cetirizine (ZYRTEC) 10 MG tablet Take 10 mg by mouth as needed. Reported on 10/09/2015    . Multiple Vitamin (MULTIVITAMIN) tablet Take 1 tablet by mouth daily. Reported on 10/09/2015    . rOPINIRole (REQUIP) 1 MG tablet Use in the evening for restless leg as needed in addition to bedtime dose of 3 mg daily. (Patient not taking: Reported on 06/16/2015) 30 tablet 11  . rOPINIRole (REQUIP) 3 MG tablet Take 1 tablet (3 mg total) by mouth at bedtime. (Patient not taking: Reported on 06/16/2015) 30 tablet 11  . triamcinolone (AZMACORT) 75 MCG/ACT inhaler Inhale 2 puffs into the lungs as needed. Reported on 10/09/2015     No current facility-administered medications for this visit.   Facility-Administered Medications Ordered in Other  Visits  Medication Dose Route Frequency Provider Last Rate Last Dose  . sodium chloride 0.9 % injection 10 mL  10 mL Intracatheter PRN Jeralyn Ruths, MD        Review of Systems:  GENERAL: Feels great.  "A whole new person".  No fevers or sweats. Weight down 3 pounds. PERFORMANCE STATUS (ECOG):  0 HEENT: No visual changes.  No sore throat, runny nose, sore throat, mouth sores or tenderness. Lungs: No shortness of breath or cough.  No hemoptysis. Cardiac:  No chest pain, palpitations, orthopnea, or PND. GI:  Ice pica improved (see HPI).  No diarrhea, constipation, melena or hematochezia. GU:  No urgency, frequency, dysuria, or  hematuria. Musculoskeletal:  No back pain.  No joint pain.  No muscle tenderness. Extremities:  Feet feel cold, burning, stinging.  Sometimes feet swell.  No change in color. Skin:  No rashes or skin changes. Neuro:  Headaches, taking BC powders.  Restless leg, improved.  No numbness or weakness, balance or coordination issues. Endocrine:  No diabetes, thyroid issues, hot flashes or night sweats. Psych:  No mood changes, depression or anxiety.  Sleeping better. Pain:  No focal pain. Review of systems:  All other systems reviewed and found to be negative.   Physical Exam: Blood pressure 142/85, pulse 68, temperature 99.6 F (37.6 C), temperature source Tympanic, resp. rate 18, height 5' (1.524 m), weight 128 lb 15.5 oz (58.5 kg). GENERAL:  Well developed, well nourished, sitting comfortably in the exam room in no acute distress. MENTAL STATUS:  Alert and oriented to person, place and time. HEAD:  Short curly blonde hair.  Glasses propped on head.  Normocephalic, atraumatic, face symmetric, no Cushingoid features. EYES:  Blue/green eyes.  Pupils equal round and reactive to light and accomodation.  No conjunctivitis or scleral icterus. ENT:  Oropharynx clear without lesion.  Tongue normal. Mucous membranes moist.  RESPIRATORY:  Clear to auscultation without rales, wheezes or rhonchi. CARDIOVASCULAR:  Regular rate and rhythm without murmur, rub or gallop. ABDOMEN:  Soft, non-tender, with active bowel sounds, and no hepatosplenomegaly.  No masses. SKIN:  No rashes, ulcers or lesions. EXTREMITIES: No edema, no skin discoloration or tenderness.  No palpable cords. LYMPH NODES: No palpable cervical, supraclavicular, axillary or inguinal adenopathy  NEUROLOGICAL: Unremarkable. PSYCH:  Appropriate.   Appointment on 10/09/2015  Component Date Value Ref Range Status  . WBC 10/09/2015 4.8  3.6 - 11.0 K/uL Final  . RBC 10/09/2015 4.19  3.80 - 5.20 MIL/uL Final  . Hemoglobin 10/09/2015 13.7  12.0 -  16.0 g/dL Final  . HCT 09/81/1914 40.1  35.0 - 47.0 % Final  . MCV 10/09/2015 95.8  80.0 - 100.0 fL Final  . MCH 10/09/2015 32.6  26.0 - 34.0 pg Final  . MCHC 10/09/2015 34.0  32.0 - 36.0 g/dL Final  . RDW 78/29/5621 16.9* 11.5 - 14.5 % Final  . Platelets 10/09/2015 268  150 - 440 K/uL Final  . Neutrophils Relative % 10/09/2015 66   Final  . Neutro Abs 10/09/2015 3.2  1.4 - 6.5 K/uL Final  . Lymphocytes Relative 10/09/2015 23   Final  . Lymphs Abs 10/09/2015 1.1  1.0 - 3.6 K/uL Final  . Monocytes Relative 10/09/2015 8   Final  . Monocytes Absolute 10/09/2015 0.4  0.2 - 0.9 K/uL Final  . Eosinophils Relative 10/09/2015 2   Final  . Eosinophils Absolute 10/09/2015 0.1  0 - 0.7 K/uL Final  . Basophils Relative 10/09/2015 1   Final  .  Basophils Absolute 10/09/2015 0.1  0 - 0.1 K/uL Final  . Ferritin 10/09/2015 5* 11 - 307 ng/mL Final  . Iron 10/09/2015 40  28 - 170 ug/dL Final  . TIBC 11/91/4782 384  250 - 450 ug/dL Final  . Saturation Ratios 10/09/2015 10* 10.4 - 31.8 % Final  . UIBC 10/09/2015 344   Final    Assessment:  Brenda Hawkins is a 50 y.o. female status post gastric bypass surgery in 03/2002 and subsequent development of iron deficiency anemia and B12 deficiency.  She is intolerant of oral iron.  She denies any melena or hematochezia.  Stool guaiacs were negative on 01/02/2015.  She received her 4 infusions of Venofer (05/09 - 02/07/2015). She receives B12 (began 01/16/2015; last 05/18/2015). Hematocrit initially improved from 30.0 to 42.8 and was subsequently drifted down to 40.1.  MCV is normal.   Symptomatically, she feels great. Her ice pica has improved. She has lost weight.  Exam is stable.  Hematocrit is normal.  Ferritin is 5 (low).  Plan: 1. Labs today:  CBC with diff, ferritin, iron studies. 2. B12 today and monthly. 3. Discuss hematocrit drifting down and low ferritin.  Discuss Venofer x 1 infusion.  If becomes symptomatic prior to follow-up, patient to call for  additional infusions. 4. Encourage colonoscopy and mammogram.  Patient declines.  Discuss concern about unexplained weight loss.  Patient states weight loss usually precedes symptomatic iron deficiency.  Patient states will reconsider colonoscopy at age 26.  She is more concerned about heart disease and stroke rather than cancer. 5. Venofer 200 mg IV tomorrow. 6. RTC in 3 months for MD assessment, labs (CBC with diff, ferritin), and B12.   Rosey Bath, MD  10/09/2015, 2:59 PM

## 2015-10-10 ENCOUNTER — Inpatient Hospital Stay: Payer: BLUE CROSS/BLUE SHIELD

## 2015-10-10 VITALS — BP 133/83 | HR 89 | Temp 98.7°F | Resp 18

## 2015-10-10 DIAGNOSIS — D509 Iron deficiency anemia, unspecified: Secondary | ICD-10-CM | POA: Diagnosis not present

## 2015-10-10 MED ORDER — SODIUM CHLORIDE 0.9 % IV SOLN
200.0000 mg | Freq: Once | INTRAVENOUS | Status: AC
  Administered 2015-10-10: 200 mg via INTRAVENOUS
  Filled 2015-10-10: qty 10

## 2015-10-10 MED ORDER — CYANOCOBALAMIN 1000 MCG/ML IJ SOLN
1000.0000 ug | Freq: Once | INTRAMUSCULAR | Status: AC
  Administered 2015-10-10: 1000 ug via INTRAMUSCULAR
  Filled 2015-10-10: qty 1

## 2015-10-10 MED ORDER — SODIUM CHLORIDE 0.9 % IV SOLN
Freq: Once | INTRAVENOUS | Status: AC
Start: 2015-10-10 — End: 2015-10-10
  Administered 2015-10-10: 14:00:00 via INTRAVENOUS
  Filled 2015-10-10: qty 1000

## 2015-10-17 ENCOUNTER — Encounter: Payer: Self-pay | Admitting: Hematology and Oncology

## 2015-11-06 ENCOUNTER — Other Ambulatory Visit: Payer: Self-pay | Admitting: Hematology and Oncology

## 2015-11-06 ENCOUNTER — Inpatient Hospital Stay: Payer: BLUE CROSS/BLUE SHIELD | Attending: Hematology and Oncology

## 2015-11-06 DIAGNOSIS — Z9884 Bariatric surgery status: Secondary | ICD-10-CM | POA: Diagnosis not present

## 2015-11-06 DIAGNOSIS — D509 Iron deficiency anemia, unspecified: Secondary | ICD-10-CM | POA: Insufficient documentation

## 2015-11-06 DIAGNOSIS — E538 Deficiency of other specified B group vitamins: Secondary | ICD-10-CM | POA: Insufficient documentation

## 2015-11-06 MED ORDER — SODIUM CHLORIDE 0.9 % IJ SOLN
10.0000 mL | INTRAMUSCULAR | Status: DC | PRN
  Filled 2015-11-06: qty 10

## 2015-11-06 MED ORDER — HEPARIN SOD (PORK) LOCK FLUSH 100 UNIT/ML IV SOLN: 250.0000 [IU] | Freq: Once | INTRAVENOUS | Status: DC | PRN

## 2015-11-06 MED ORDER — HEPARIN SOD (PORK) LOCK FLUSH 100 UNIT/ML IV SOLN: 500.0000 [IU] | Freq: Once | INTRAVENOUS | Status: DC | PRN

## 2015-11-06 MED ORDER — ALTEPLASE 2 MG IJ SOLR: 2.0000 mg | Freq: Once | INTRAMUSCULAR | Status: DC | PRN

## 2015-11-06 MED ORDER — SODIUM CHLORIDE 0.9 % IJ SOLN
3.0000 mL | Freq: Once | INTRAMUSCULAR | Status: DC | PRN
  Filled 2015-11-06: qty 10

## 2015-11-06 MED ORDER — CYANOCOBALAMIN 1000 MCG/ML IJ SOLN
1000.0000 ug | Freq: Once | INTRAMUSCULAR | Status: AC
Start: 2015-11-06 — End: 2015-11-06
  Administered 2015-11-06: 1000 ug via INTRAMUSCULAR
  Filled 2015-11-06: qty 1

## 2015-12-04 ENCOUNTER — Inpatient Hospital Stay: Payer: BLUE CROSS/BLUE SHIELD | Attending: Hematology and Oncology

## 2015-12-04 DIAGNOSIS — D509 Iron deficiency anemia, unspecified: Secondary | ICD-10-CM | POA: Diagnosis not present

## 2015-12-04 DIAGNOSIS — Z9884 Bariatric surgery status: Secondary | ICD-10-CM | POA: Diagnosis not present

## 2015-12-04 DIAGNOSIS — E538 Deficiency of other specified B group vitamins: Secondary | ICD-10-CM | POA: Insufficient documentation

## 2015-12-04 MED ORDER — CYANOCOBALAMIN 1000 MCG/ML IJ SOLN
1000.0000 ug | Freq: Once | INTRAMUSCULAR | Status: AC
  Administered 2015-12-04: 1000 ug via INTRAMUSCULAR
  Filled 2015-12-04: qty 1

## 2015-12-31 ENCOUNTER — Other Ambulatory Visit: Payer: Self-pay | Admitting: Hematology and Oncology

## 2016-01-01 ENCOUNTER — Inpatient Hospital Stay: Payer: BLUE CROSS/BLUE SHIELD

## 2016-01-01 ENCOUNTER — Ambulatory Visit: Payer: BLUE CROSS/BLUE SHIELD

## 2016-01-01 ENCOUNTER — Ambulatory Visit
Admission: RE | Admit: 2016-01-01 | Discharge: 2016-01-01 | Disposition: A | Discharge status: Home / Self Care | Payer: BLUE CROSS/BLUE SHIELD | Source: Ambulatory Visit | Attending: Hematology and Oncology | Admitting: Hematology and Oncology

## 2016-01-01 ENCOUNTER — Inpatient Hospital Stay: Payer: BLUE CROSS/BLUE SHIELD | Attending: Hematology and Oncology | Admitting: Hematology and Oncology

## 2016-01-01 VITALS — BP 171/80 | HR 98 | Temp 97.5°F | Resp 18 | Wt 129.6 lb

## 2016-01-01 DIAGNOSIS — I10 Essential (primary) hypertension: Secondary | ICD-10-CM | POA: Diagnosis not present

## 2016-01-01 DIAGNOSIS — Z79899 Other long term (current) drug therapy: Secondary | ICD-10-CM | POA: Diagnosis not present

## 2016-01-01 DIAGNOSIS — H9319 Tinnitus, unspecified ear: Secondary | ICD-10-CM | POA: Insufficient documentation

## 2016-01-01 DIAGNOSIS — F5089 Other specified eating disorder: Secondary | ICD-10-CM | POA: Diagnosis not present

## 2016-01-01 DIAGNOSIS — E538 Deficiency of other specified B group vitamins: Secondary | ICD-10-CM | POA: Diagnosis not present

## 2016-01-01 DIAGNOSIS — K219 Gastro-esophageal reflux disease without esophagitis: Secondary | ICD-10-CM | POA: Diagnosis not present

## 2016-01-01 DIAGNOSIS — G2581 Restless legs syndrome: Secondary | ICD-10-CM | POA: Diagnosis not present

## 2016-01-01 DIAGNOSIS — D509 Iron deficiency anemia, unspecified: Secondary | ICD-10-CM | POA: Insufficient documentation

## 2016-01-01 DIAGNOSIS — G4730 Sleep apnea, unspecified: Secondary | ICD-10-CM | POA: Insufficient documentation

## 2016-01-01 DIAGNOSIS — F17210 Nicotine dependence, cigarettes, uncomplicated: Secondary | ICD-10-CM | POA: Diagnosis not present

## 2016-01-01 DIAGNOSIS — Z9884 Bariatric surgery status: Secondary | ICD-10-CM | POA: Diagnosis not present

## 2016-01-01 DIAGNOSIS — R059 Cough, unspecified: Secondary | ICD-10-CM

## 2016-01-01 DIAGNOSIS — Z9989 Dependence on other enabling machines and devices: Secondary | ICD-10-CM | POA: Diagnosis not present

## 2016-01-01 DIAGNOSIS — R05 Cough: Secondary | ICD-10-CM | POA: Diagnosis not present

## 2016-01-01 DIAGNOSIS — M8580 Other specified disorders of bone density and structure, unspecified site: Secondary | ICD-10-CM | POA: Insufficient documentation

## 2016-01-01 DIAGNOSIS — E119 Type 2 diabetes mellitus without complications: Secondary | ICD-10-CM | POA: Insufficient documentation

## 2016-01-01 DIAGNOSIS — R5383 Other fatigue: Secondary | ICD-10-CM | POA: Diagnosis not present

## 2016-01-01 DIAGNOSIS — D518 Other vitamin B12 deficiency anemias: Secondary | ICD-10-CM

## 2016-01-01 LAB — CBC WITH DIFFERENTIAL/PLATELET
Basophils Absolute: 0.1 10*3/uL (ref 0–0.1)
Basophils Relative: 1 %
Eosinophils Absolute: 0.1 10*3/uL (ref 0–0.7)
Eosinophils Relative: 2 %
HCT: 43.9 % (ref 35.0–47.0)
Hemoglobin: 15 g/dL (ref 12.0–16.0)
Lymphocytes Relative: 22 %
Lymphs Abs: 1.1 10*3/uL (ref 1.0–3.6)
MCH: 33.8 pg (ref 26.0–34.0)
MCHC: 34.1 g/dL (ref 32.0–36.0)
MCV: 99 fL (ref 80.0–100.0)
Monocytes Absolute: 0.4 10*3/uL (ref 0.2–0.9)
Monocytes Relative: 7 %
Neutro Abs: 3.6 10*3/uL (ref 1.4–6.5)
Neutrophils Relative %: 68 %
Platelets: 278 10*3/uL (ref 150–440)
RBC: 4.43 MIL/uL (ref 3.80–5.20)
RDW: 16.5 % — ABNORMAL HIGH (ref 11.5–14.5)
WBC: 5.2 10*3/uL (ref 3.6–11.0)

## 2016-01-01 LAB — FERRITIN: Ferritin: 5 ng/mL — ABNORMAL LOW (ref 11–307)

## 2016-01-01 MED ORDER — CYANOCOBALAMIN 1000 MCG/ML IJ SOLN
1000.0000 ug | Freq: Once | INTRAMUSCULAR | Status: AC
Start: 2016-01-01 — End: 2016-01-01
  Administered 2016-01-01: 1000 ug via INTRAMUSCULAR
  Filled 2016-01-01: qty 1

## 2016-01-01 MED ORDER — ROPINIROLE HCL 3 MG PO TABS: 3.0000 mg | ORAL_TABLET | Freq: Every day | ORAL | Status: DC

## 2016-01-01 MED ORDER — ROPINIROLE HCL 1 MG PO TABS: ORAL_TABLET | ORAL | Status: DC

## 2016-01-01 NOTE — Progress Notes (Signed)
[facility]-  Cancer Center  Clinic day:  01/01/2016  Chief Complaint: Brenda Hawkins is a 50 y.o. female status post gastric bypass surgery with iron deficiency anemia and B12 deficiency who is seen for 3 month assessment.  HPI: The patient was last seen in medical oncology clinic on 10/09/2015.  At that time, she felt great. Her ice pica had improved. She had lost weight.  Exam was stable.  Hematocrit was 40.  Ferritin was 5 (low).  She received Venofer 200 mg IV on 10/10/2015.  She has received monthly B12 (10/10/2015, 11/06/2015, and 12/04/2015).  During the interim, she notes return of ice pica and restless legs.  She denies any melena or hematochezia.  She describes going into a "2 hour food coma" after eating.  She notes worsening fatigue.  Energy level is poor.  She notes a history of sleep apnea.  She previously used CPAP.  She describes smoking 1 1/2 to 2 packs per day.   Past Medical History  Diagnosis Date  . Anxiety   . Asthma   . Diabetes mellitus type II   . GERD (gastroesophageal reflux disease)   . Hypertension   . Osteopenia   . Obesity   . Sleep apnea   . Vitamin B12 deficiency     since bariatric surgery  . 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 Anemia     Past Surgical History  Procedure Laterality Date  . Gastric bypass  03/17/02    Family History  Problem Relation Age of Onset  . Heart disease Mother     valve replacement surgery  . Coronary artery disease Father     Social History:  reports that she has quit smoking. She has never used smokeless tobacco. She reports that she does not drink alcohol or use illicit drugs.  She quit smoking > 20 years ago, but started smoking again 3-4 years ago.  She smokes 1 1/2 - 2 packs a day.  The patient is alone today.  Allergies:  Allergies  Allergen Reactions  . Codeine Sulfate   . Penicillins     REACTION: questionable    Current Medications: Current Outpatient Prescriptions  Medication Sig  Dispense Refill  . albuterol (PROVENTIL,VENTOLIN) 90 MCG/ACT inhaler Inhale 2 puffs into the lungs every 6 (six) hours as needed for wheezing. Use 2 puffs 4 times a day as needed. 17 g 2  . calcium citrate-vitamin D (CITRACAL+D) 315-200 MG-UNIT per tablet Take 1 tablet by mouth daily. Reported on 10/09/2015    . cetirizine (ZYRTEC) 10 MG tablet Take 10 mg by mouth as needed. Reported on 10/09/2015    . cyanocobalamin (,VITAMIN B-12,) 1000 MCG/ML injection Inject into the muscle. One injection every 6 months.     . Multiple Vitamin (MULTIVITAMIN) tablet Take 1 tablet by mouth daily. Reported on 10/09/2015    . triamcinolone (AZMACORT) 75 MCG/ACT inhaler Inhale 2 puffs into the lungs as needed. Reported on 10/09/2015    . rOPINIRole (REQUIP) 1 MG tablet Use in the evening for restless leg as needed in addition to bedtime dose of 3 mg daily. (Patient not taking: Reported on 01/01/2016) 30 tablet 11  . rOPINIRole (REQUIP) 3 MG tablet Take 1 tablet (3 mg total) by mouth at bedtime. (Patient not taking: Reported on 06/16/2015) 30 tablet 11   No current facility-administered medications for this visit.   Facility-Administered Medications Ordered in Other Visits  Medication Dose Route Frequency Provider Last Rate Last Dose  . sodium chloride  0.9 % injection 10 mL  10 mL Intracatheter PRN Jeralyn Ruths, MD        Review of Systems:  GENERAL: Fatigue.  No fevers or sweats. Weight up 1 pound. PERFORMANCE STATUS (ECOG):  0 HEENT: Decreased hearing.  Tinnitus.  No visual changes.  No sore throat, runny nose, sore throat, mouth sores or tenderness. Lungs: No shortness of breath.  Rare cough.  No hemoptysis.  h/o sleep apnea, not using CPAP. Cardiac:  No chest pain, palpitations, orthopnea, or PND. GI:  Ice pica worse (see HPI).  No diarrhea, constipation, melena or hematochezia. GU:  No urgency, frequency, dysuria, or hematuria. Musculoskeletal:  No back pain.  No joint pain.  No muscle  tenderness. Extremities:  Feet feel cold, burning, stinging.  Sometimes feet swell.  No change in color. Skin:  No rashes or skin changes. Neuro:  Headaches, taking BC powders.  Restless leg, worse.  No numbness or weakness, balance or coordination issues. Endocrine:  No diabetes, thyroid issues, hot flashes or night sweats. Psych:  No mood changes, depression or anxiety.  Sleeping better. Pain:  No focal pain. Review of systems:  All other systems reviewed and found to be negative.   Physical Exam: Blood pressure 171/80, pulse 98, temperature 97.5 F (36.4 C), temperature source Tympanic, resp. rate 18, weight 129 lb 10.1 oz (58.8 kg). GENERAL:  Well developed, well nourished, sitting comfortably in the exam room in no acute distress. MENTAL STATUS:  Alert and oriented to person, place and time. HEAD:  Short curly blonde hair.  Normocephalic, atraumatic, face symmetric, no Cushingoid features. EYES:  Green eyes.  Pupils equal round and reactive to light and accomodation.  No conjunctivitis or scleral icterus. ENT:  Oropharynx clear without lesion.  Tongue normal. Mucous membranes moist.  RESPIRATORY:  Clear to auscultation without rales, wheezes or rhonchi. CARDIOVASCULAR:  Regular rate and rhythm without murmur, rub or gallop. ABDOMEN:  Soft, non-tender, with active bowel sounds, and no hepatosplenomegaly.  No masses. SKIN:  No rashes, ulcers or lesions. EXTREMITIES: No edema, no skin discoloration or tenderness.  No palpable cords. LYMPH NODES: No palpable cervical, supraclavicular, axillary or inguinal adenopathy  NEUROLOGICAL: Unremarkable. PSYCH:  Appropriate.   Appointment on 01/01/2016  Component Date Value Ref Range Status  . WBC 01/01/2016 5.2  3.6 - 11.0 K/uL Final  . RBC 01/01/2016 4.43  3.80 - 5.20 MIL/uL Final  . Hemoglobin 01/01/2016 15.0  12.0 - 16.0 g/dL Final  . HCT 16/06/9603 43.9  35.0 - 47.0 % Final  . MCV 01/01/2016 99.0  80.0 - 100.0 fL Final  . MCH 01/01/2016  33.8  26.0 - 34.0 pg Final  . MCHC 01/01/2016 34.1  32.0 - 36.0 g/dL Final  . RDW 54/05/8118 16.5* 11.5 - 14.5 % Final  . Platelets 01/01/2016 278  150 - 440 K/uL Final  . Neutrophils Relative % 01/01/2016 68   Final  . Neutro Abs 01/01/2016 3.6  1.4 - 6.5 K/uL Final  . Lymphocytes Relative 01/01/2016 22   Final  . Lymphs Abs 01/01/2016 1.1  1.0 - 3.6 K/uL Final  . Monocytes Relative 01/01/2016 7   Final  . Monocytes Absolute 01/01/2016 0.4  0.2 - 0.9 K/uL Final  . Eosinophils Relative 01/01/2016 2   Final  . Eosinophils Absolute 01/01/2016 0.1  0 - 0.7 K/uL Final  . Basophils Relative 01/01/2016 1   Final  . Basophils Absolute 01/01/2016 0.1  0 - 0.1 K/uL Final    Assessment:  Brenda Hawkins is a 50 y.o. female status post gastric bypass surgery in 03/2002 and subsequent development of iron deficiency anemia and B12 deficiency.  She is intolerant of oral iron.  She denies any melena or hematochezia.  Stool guaiacs were negative on 01/02/2015.  She received her 4 infusions of Venofer (05/09 - 02/07/2015). She receives B12 (began 01/16/2015; last 12/04/2015). Hematocrit initially improved from 30.0 to 42.8 and was subsequently drifted down to 40.1.  She received Venofer 200 mg IV on 10/10/2015.   She has a history of sleep apnea.  She previously used CPAP when she was heavier.  She is more fatigued.  She smokes 1 1/2 - 2 packs per day.  Symptomatically, her ice pica has increased.  She has restless legs.  Weight is stable.  Exam is stable.  Hematocrit is 43.9.  Ferritin remains 5 (low).  Oxygen saturations were 98%.  Plan: 1.  Labs today:  CBC with diff, ferritin. 2.  B12 today and monthly. 3.  CXR (PA and lateral)- cough. 4.  Discuss increasing hematocrit with normal MCV and low iron stores.  Discuss holding off on additional IV iron given increasing hematocrit.  Patient not polycythemic, but hematocrit generous.  Given increasing fatigue, rule out other factors.  Possible drive to  make RBCs secondary to smoking.  Discuss smoking cessation. Rule out sleep apnea (patient has a history, no longer on CPAP). 5.  Discuss colonoscopy and mammogram.  Patient declines.  Patient states that she will consider after her next birthday.  She remains more concerned about heart disease and stroke rather than cancer. 6.  Discuss smoking cessation. 7.  Discuss sleep apnea testing/overnight oximetry. 8.  RTC in 3 months for MD assessment, labs (CBC with diff, ferritin), B12, and +/- Venofer.  Addendum:  CXR was negative.   Rosey BathMelissa C Corcoran, MD  01/01/2016, 1:57 PM

## 2016-01-01 NOTE — Progress Notes (Signed)
Complaints of numbness/tingling in hands and feet, having occasional headaches, leg cramps with muscle jerks at bedtime (request refill of requip if possible). States requip helps with the leg cramps and muscle jerks at night. Has occasional abd discomfort in LUQ and RLQ. Today is experiencing numbness/tingling in both hands.

## 2016-01-01 NOTE — Patient Instructions (Signed)
[facility] after she gets b12 shot today in our clinic.

## 2016-01-02 ENCOUNTER — Encounter: Payer: Self-pay | Admitting: Hematology and Oncology

## 2016-01-23 ENCOUNTER — Telehealth: Payer: Self-pay

## 2016-01-23 NOTE — Telephone Encounter (Signed)
Called pt to follow up to see if she had her sleep study scheduled yet.  Left VM no answer asked pt to call me back

## 2016-01-29 ENCOUNTER — Inpatient Hospital Stay: Payer: BLUE CROSS/BLUE SHIELD | Attending: Hematology and Oncology

## 2016-01-29 DIAGNOSIS — D509 Iron deficiency anemia, unspecified: Secondary | ICD-10-CM

## 2016-01-29 DIAGNOSIS — F5089 Other specified eating disorder: Secondary | ICD-10-CM | POA: Insufficient documentation

## 2016-01-29 DIAGNOSIS — E538 Deficiency of other specified B group vitamins: Secondary | ICD-10-CM | POA: Diagnosis not present

## 2016-01-29 DIAGNOSIS — Z9884 Bariatric surgery status: Secondary | ICD-10-CM | POA: Insufficient documentation

## 2016-01-29 MED ORDER — CYANOCOBALAMIN 1000 MCG/ML IJ SOLN
1000.0000 ug | Freq: Once | INTRAMUSCULAR | Status: AC
  Administered 2016-01-29: 1000 ug via INTRAMUSCULAR
  Filled 2016-01-29: qty 1

## 2016-02-26 ENCOUNTER — Inpatient Hospital Stay: Payer: BLUE CROSS/BLUE SHIELD | Attending: Hematology and Oncology

## 2016-02-26 DIAGNOSIS — Z9884 Bariatric surgery status: Secondary | ICD-10-CM | POA: Diagnosis not present

## 2016-02-26 DIAGNOSIS — F5089 Other specified eating disorder: Secondary | ICD-10-CM | POA: Insufficient documentation

## 2016-02-26 DIAGNOSIS — D509 Iron deficiency anemia, unspecified: Secondary | ICD-10-CM | POA: Diagnosis not present

## 2016-02-26 DIAGNOSIS — E538 Deficiency of other specified B group vitamins: Secondary | ICD-10-CM | POA: Diagnosis not present

## 2016-02-26 MED ORDER — CYANOCOBALAMIN 1000 MCG/ML IJ SOLN
1000.0000 ug | Freq: Once | INTRAMUSCULAR | Status: AC
  Administered 2016-02-26: 1000 ug via INTRAMUSCULAR
  Filled 2016-02-26: qty 1

## 2016-03-25 ENCOUNTER — Inpatient Hospital Stay: Payer: BLUE CROSS/BLUE SHIELD

## 2016-04-01 ENCOUNTER — Inpatient Hospital Stay: Payer: BLUE CROSS/BLUE SHIELD | Attending: Hematology and Oncology

## 2016-04-01 ENCOUNTER — Inpatient Hospital Stay (HOSPITAL_BASED_OUTPATIENT_CLINIC_OR_DEPARTMENT_OTHER): Payer: BLUE CROSS/BLUE SHIELD | Admitting: Hematology and Oncology

## 2016-04-01 VITALS — BP 139/86 | HR 86 | Temp 97.5°F | Resp 18 | Wt 126.1 lb

## 2016-04-01 DIAGNOSIS — F17210 Nicotine dependence, cigarettes, uncomplicated: Secondary | ICD-10-CM | POA: Diagnosis not present

## 2016-04-01 DIAGNOSIS — M8580 Other specified disorders of bone density and structure, unspecified site: Secondary | ICD-10-CM

## 2016-04-01 DIAGNOSIS — G2581 Restless legs syndrome: Secondary | ICD-10-CM | POA: Diagnosis not present

## 2016-04-01 DIAGNOSIS — F5089 Other specified eating disorder: Secondary | ICD-10-CM | POA: Insufficient documentation

## 2016-04-01 DIAGNOSIS — I10 Essential (primary) hypertension: Secondary | ICD-10-CM | POA: Diagnosis not present

## 2016-04-01 DIAGNOSIS — K219 Gastro-esophageal reflux disease without esophagitis: Secondary | ICD-10-CM | POA: Insufficient documentation

## 2016-04-01 DIAGNOSIS — Z880 Allergy status to penicillin: Secondary | ICD-10-CM | POA: Insufficient documentation

## 2016-04-01 DIAGNOSIS — E119 Type 2 diabetes mellitus without complications: Secondary | ICD-10-CM | POA: Insufficient documentation

## 2016-04-01 DIAGNOSIS — Z79899 Other long term (current) drug therapy: Secondary | ICD-10-CM | POA: Insufficient documentation

## 2016-04-01 DIAGNOSIS — D508 Other iron deficiency anemias: Secondary | ICD-10-CM | POA: Diagnosis not present

## 2016-04-01 DIAGNOSIS — J45909 Unspecified asthma, uncomplicated: Secondary | ICD-10-CM

## 2016-04-01 DIAGNOSIS — D509 Iron deficiency anemia, unspecified: Secondary | ICD-10-CM

## 2016-04-01 DIAGNOSIS — Z9884 Bariatric surgery status: Secondary | ICD-10-CM | POA: Diagnosis not present

## 2016-04-01 DIAGNOSIS — E538 Deficiency of other specified B group vitamins: Secondary | ICD-10-CM | POA: Insufficient documentation

## 2016-04-01 DIAGNOSIS — G4730 Sleep apnea, unspecified: Secondary | ICD-10-CM

## 2016-04-01 DIAGNOSIS — F419 Anxiety disorder, unspecified: Secondary | ICD-10-CM | POA: Diagnosis not present

## 2016-04-01 DIAGNOSIS — R5383 Other fatigue: Secondary | ICD-10-CM | POA: Diagnosis not present

## 2016-04-01 LAB — CBC WITH DIFFERENTIAL/PLATELET
Basophils Absolute: 0 10*3/uL (ref 0–0.1)
Basophils Relative: 1 %
Eosinophils Absolute: 0.1 10*3/uL (ref 0–0.7)
Eosinophils Relative: 1 %
HCT: 42.5 % (ref 35.0–47.0)
Hemoglobin: 14.1 g/dL (ref 12.0–16.0)
Lymphocytes Relative: 20 %
Lymphs Abs: 1.2 10*3/uL (ref 1.0–3.6)
MCH: 33.2 pg (ref 26.0–34.0)
MCHC: 33.2 g/dL (ref 32.0–36.0)
MCV: 99.8 fL (ref 80.0–100.0)
Monocytes Absolute: 0.5 10*3/uL (ref 0.2–0.9)
Monocytes Relative: 8 %
Neutro Abs: 4.1 10*3/uL (ref 1.4–6.5)
Neutrophils Relative %: 70 %
Platelets: 262 10*3/uL (ref 150–440)
RBC: 4.26 MIL/uL (ref 3.80–5.20)
RDW: 17.4 % — ABNORMAL HIGH (ref 11.5–14.5)
WBC: 5.8 10*3/uL (ref 3.6–11.0)

## 2016-04-01 LAB — FERRITIN: Ferritin: 6 ng/mL — ABNORMAL LOW (ref 11–307)

## 2016-04-01 MED ORDER — CYANOCOBALAMIN 1000 MCG/ML IJ SOLN
1000.0000 ug | Freq: Once | INTRAMUSCULAR | Status: AC
  Administered 2016-04-01: 1000 ug via INTRAMUSCULAR
  Filled 2016-04-01: qty 1

## 2016-04-01 NOTE — Progress Notes (Signed)
Patient is here for follow-up, no complaints or concerns. 

## 2016-04-01 NOTE — Progress Notes (Signed)
[facility]-  Cancer Center  Clinic day:  04/01/16  Chief Complaint: Brenda Hawkins is a 50 y.o. female status post gastric bypass surgery with iron deficiency anemia and B12 deficiency who is seen for 3 month assessment.  HPI: The patient was last seen in medical oncology clinic on 01/01/2016.  At that time, her ice pica had increased.  She had restless legs.  Weight was stable.  Exam was stable.  Hematocrit was 43.9.  Ferritin remained 5 (low).  Oxygen saturations were 98%.  She received B12 on 01/01/2016. She has continued B12 monthly (last 02/26/2016).  She had a significant cough. Chest x-ray 01/01/2016 revealed no acute cardiopulmonary disease.  At last visit, we discussed postponing additional IV iron given her increasing hematocrit.  Hematocrit was generous and likely secondary to secondary polycythemia.  I discussed smoking cessation. We discussed sleep apnea testing as she had a history and was no longer is using CPAP.  At last visit we also discussed colonoscopy and mammogram. She declined but would reconsider after her next birthday on 11/06/2017.  During the interim, she has felt "about the same". She has cut back from 2 packs a day to 1 pack a day.   Past Medical History:  Diagnosis Date  . Anemia   . Anxiety   . Asthma   . Diabetes mellitus type II   . GERD (gastroesophageal reflux disease)   . Hypertension   . Obesity   . Osteopenia   . Sleep apnea   . 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 Vitamin B12 deficiency    since bariatric surgery    Past Surgical History:  Procedure Laterality Date  . GASTRIC BYPASS  03/17/02    Family History  Problem Relation Age of Onset  . Heart disease Mother     valve replacement surgery  . Coronary artery disease Father     Social History:  reports that she has quit smoking. She has never used smokeless tobacco. She reports that she does not drink alcohol or use drugs.  She quit smoking > 20 years ago, but started  smoking again 3-4 years ago.  She smokes 1 pack a day.  The patient is alone today.  Allergies:  Allergies  Allergen Reactions  . Codeine Sulfate   . Penicillins     REACTION: questionable    Current Medications: Current Outpatient Prescriptions  Medication Sig Dispense Refill  . albuterol (PROVENTIL,VENTOLIN) 90 MCG/ACT inhaler Inhale 2 puffs into the lungs every 6 (six) hours as needed for wheezing. Use 2 puffs 4 times a day as needed. 17 g 2  . calcium citrate-vitamin D (CITRACAL+D) 315-200 MG-UNIT per tablet Take 1 tablet by mouth daily. Reported on 10/09/2015    . cetirizine (ZYRTEC) 10 MG tablet Take 10 mg by mouth as needed. Reported on 10/09/2015    . cyanocobalamin (,VITAMIN B-12,) 1000 MCG/ML injection Inject into the muscle. One injection every 6 months.     . Multiple Vitamin (MULTIVITAMIN) tablet Take 1 tablet by mouth daily. Reported on 10/09/2015    . rOPINIRole (REQUIP) 1 MG tablet Use in the evening for restless leg as needed in addition to bedtime dose of 3 mg daily. 30 tablet 0  . rOPINIRole (REQUIP) 3 MG tablet Take 1 tablet (3 mg total) by mouth at bedtime. 30 tablet 0  . triamcinolone (AZMACORT) 75 MCG/ACT inhaler Inhale 2 puffs into the lungs as needed. Reported on 10/09/2015     No current facility-administered medications for this  visit.    Facility-Administered Medications Ordered in Other Visits  Medication Dose Route Frequency Provider Last Rate Last Dose  . sodium chloride 0.9 % injection 10 mL  10 mL Intracatheter PRN Jeralyn Ruths, MD        Review of Systems:  GENERAL: Fatigue.  No fevers or sweats. Weight down 3 pounds. PERFORMANCE STATUS (ECOG):  0 HEENT:  Tinnitus.  No visual changes.  No sore throat, runny nose, sore throat, mouth sores or tenderness. Lungs: No shortness of breath.  No hemoptysis.  h/o sleep apnea, not using CPAP. Cardiac:  No chest pain, palpitations, orthopnea, or PND. GI:  Ice pica.  No diarrhea, constipation, melena or  hematochezia. GU:  No urgency, frequency, dysuria, or hematuria. Musculoskeletal:  No back pain.  No joint pain.  No muscle tenderness. Extremities:  Sometimes feet swell.  No change in color. Skin:  No rashes or skin changes. Neuro:  Restless leg.  No numbness or weakness, balance or coordination issues. Endocrine:  No diabetes, thyroid issues, hot flashes or night sweats. Psych:  No mood changes, depression or anxiety.  Sleeping better. Pain:  No focal pain. Review of systems:  All other systems reviewed and found to be negative.   Physical Exam: Blood pressure 139/86, pulse 86, temperature 97.5 F (36.4 C), temperature source Tympanic, resp. rate 18, weight 126 lb 1.7 oz (57.2 kg). GENERAL:  Well developed, well nourished, woman sitting comfortably in the exam room in no acute distress. MENTAL STATUS:  Alert and oriented to person, place and time. HEAD:  Short curly blonde hair.  Normocephalic, atraumatic, face symmetric, no Cushingoid features. EYES:  Green eyes.  Pupils equal round and reactive to light and accomodation.  No conjunctivitis or scleral icterus. ENT:  Oropharynx clear without lesion.  Tongue normal. Mucous membranes moist.  RESPIRATORY:  Clear to auscultation without rales, wheezes or rhonchi. CARDIOVASCULAR:  Regular rate and rhythm without murmur, rub or gallop. ABDOMEN:  Soft, non-tender, with active bowel sounds, and no hepatosplenomegaly.  No masses. SKIN:  No rashes, ulcers or lesions. EXTREMITIES: No edema, no skin discoloration or tenderness.  No palpable cords. LYMPH NODES: No palpable cervical, supraclavicular, axillary or inguinal adenopathy  NEUROLOGICAL: Unremarkable. PSYCH:  Appropriate.    Appointment on 04/01/2016  Component Date Value Ref Range Status  . WBC 04/01/2016 5.8  3.6 - 11.0 K/uL Final  . RBC 04/01/2016 4.26  3.80 - 5.20 MIL/uL Final  . Hemoglobin 04/01/2016 14.1  12.0 - 16.0 g/dL Final  . HCT 45/40/9811 42.5  35.0 - 47.0 % Final  . MCV  04/01/2016 99.8  80.0 - 100.0 fL Final  . MCH 04/01/2016 33.2  26.0 - 34.0 pg Final  . MCHC 04/01/2016 33.2  32.0 - 36.0 g/dL Final  . RDW 91/47/8295 17.4* 11.5 - 14.5 % Final  . Platelets 04/01/2016 262  150 - 440 K/uL Final  . Neutrophils Relative % 04/01/2016 70  % Final  . Neutro Abs 04/01/2016 4.1  1.4 - 6.5 K/uL Final  . Lymphocytes Relative 04/01/2016 20  % Final  . Lymphs Abs 04/01/2016 1.2  1.0 - 3.6 K/uL Final  . Monocytes Relative 04/01/2016 8  % Final  . Monocytes Absolute 04/01/2016 0.5  0.2 - 0.9 K/uL Final  . Eosinophils Relative 04/01/2016 1  % Final  . Eosinophils Absolute 04/01/2016 0.1  0 - 0.7 K/uL Final  . Basophils Relative 04/01/2016 1  % Final  . Basophils Absolute 04/01/2016 0.0  0 - 0.1 K/uL  Final  . Ferritin 04/01/2016 6* 11 - 307 ng/mL Final    Assessment:  Khrystyn Merriott is a 50 y.o. female status post gastric bypass surgery in 03/2002 and subsequent development of iron deficiency anemia and B12 deficiency.  She is intolerant of oral iron.  She denies any melena or hematochezia.  Stool guaiacs were negative on 01/02/2015.  She received her 4 infusions of Venofer (05/09 - 02/07/2015). She receives B12 (began 01/16/2015; last 02/26/2016). Hematocrit initially improved from 30.0 to 42.8 and was subsequently drifted down to 40.1.  She received Venofer 200 mg IV on 10/10/2015.   She has a history of sleep apnea.  She previously used CPAP when she was heavier.  She is more fatigued.  She smokes 1 pack per day.  Symptomatically, she has restless legs.  Weight is stable.  Exam is stable.  Hematocrit is 42.5  Ferritin is 6 (low).  Plan: 1.  Labs today:  CBC with diff, ferritin. 2.  B12 today. 3.  Follow-up colonoscopy and mammogram.  Remind after her next birthday (per patient request). 4.  Encourage smoking cessation. 5.  Encourage sleep apnea testing/overnight oximetry. 6.  RTC in 1 month for CBC + B12 injection +/- Venofer 7.  RTC in 2 months for CBC + B12  injection +/- Venofer 8.  RTC in 3 months for MD assess, labs (CBC with diff, CMP, ferritin, folate, TSH) + B12 +/- Venofer   Rosey Bath, MD  04/01/2016, 3:23 PM

## 2016-04-02 ENCOUNTER — Other Ambulatory Visit: Payer: Self-pay | Admitting: *Deleted

## 2016-04-02 DIAGNOSIS — D509 Iron deficiency anemia, unspecified: Secondary | ICD-10-CM

## 2016-04-22 ENCOUNTER — Inpatient Hospital Stay: Payer: BLUE CROSS/BLUE SHIELD

## 2016-05-06 ENCOUNTER — Other Ambulatory Visit: Payer: Self-pay | Admitting: Hematology and Oncology

## 2016-05-06 ENCOUNTER — Inpatient Hospital Stay: Payer: BLUE CROSS/BLUE SHIELD | Attending: Hematology and Oncology

## 2016-05-06 ENCOUNTER — Inpatient Hospital Stay: Payer: BLUE CROSS/BLUE SHIELD

## 2016-05-06 VITALS — BP 136/78 | HR 76 | Temp 97.7°F | Resp 18

## 2016-05-06 DIAGNOSIS — D509 Iron deficiency anemia, unspecified: Secondary | ICD-10-CM | POA: Diagnosis present

## 2016-05-06 DIAGNOSIS — E538 Deficiency of other specified B group vitamins: Secondary | ICD-10-CM | POA: Insufficient documentation

## 2016-05-06 DIAGNOSIS — Z9884 Bariatric surgery status: Secondary | ICD-10-CM | POA: Insufficient documentation

## 2016-05-06 LAB — CBC WITH DIFFERENTIAL/PLATELET
Basophils Absolute: 0.1 10*3/uL (ref 0–0.1)
Basophils Relative: 1 %
Eosinophils Absolute: 0.1 10*3/uL (ref 0–0.7)
Eosinophils Relative: 2 %
HCT: 40.7 % (ref 35.0–47.0)
Hemoglobin: 13.8 g/dL (ref 12.0–16.0)
Lymphocytes Relative: 18 %
Lymphs Abs: 0.8 10*3/uL — ABNORMAL LOW (ref 1.0–3.6)
MCH: 33.7 pg (ref 26.0–34.0)
MCHC: 33.8 g/dL (ref 32.0–36.0)
MCV: 99.6 fL (ref 80.0–100.0)
Monocytes Absolute: 0.5 10*3/uL (ref 0.2–0.9)
Monocytes Relative: 10 %
Neutro Abs: 3.1 10*3/uL (ref 1.4–6.5)
Neutrophils Relative %: 69 %
Platelets: 273 10*3/uL (ref 150–440)
RBC: 4.09 MIL/uL (ref 3.80–5.20)
RDW: 15.9 % — ABNORMAL HIGH (ref 11.5–14.5)
WBC: 4.5 10*3/uL (ref 3.6–11.0)

## 2016-05-06 MED ORDER — SODIUM CHLORIDE 0.9 % IV SOLN
Freq: Once | INTRAVENOUS | Status: AC
  Administered 2016-05-06: 14:00:00 via INTRAVENOUS
  Filled 2016-05-06: qty 1000

## 2016-05-06 MED ORDER — CYANOCOBALAMIN 1000 MCG/ML IJ SOLN
1000.0000 ug | Freq: Once | INTRAMUSCULAR | Status: AC
  Administered 2016-05-06: 1000 ug via INTRAMUSCULAR
  Filled 2016-05-06: qty 1

## 2016-05-06 MED ORDER — SODIUM CHLORIDE 0.9 % IV SOLN
200.0000 mg | Freq: Once | INTRAVENOUS | Status: AC
  Administered 2016-05-06: 200 mg via INTRAVENOUS
  Filled 2016-05-06: qty 10

## 2016-06-03 ENCOUNTER — Inpatient Hospital Stay: Payer: BLUE CROSS/BLUE SHIELD | Attending: Hematology and Oncology

## 2016-06-03 ENCOUNTER — Inpatient Hospital Stay: Payer: BLUE CROSS/BLUE SHIELD

## 2016-06-03 VITALS — BP 120/77 | HR 89 | Temp 97.4°F | Resp 18

## 2016-06-03 DIAGNOSIS — D509 Iron deficiency anemia, unspecified: Secondary | ICD-10-CM

## 2016-06-03 DIAGNOSIS — Z9884 Bariatric surgery status: Secondary | ICD-10-CM | POA: Insufficient documentation

## 2016-06-03 DIAGNOSIS — D508 Other iron deficiency anemias: Secondary | ICD-10-CM | POA: Insufficient documentation

## 2016-06-03 DIAGNOSIS — E538 Deficiency of other specified B group vitamins: Secondary | ICD-10-CM | POA: Insufficient documentation

## 2016-06-03 LAB — CBC WITH DIFFERENTIAL/PLATELET
Basophils Absolute: 0.1 10*3/uL (ref 0–0.1)
Basophils Relative: 1 %
Eosinophils Absolute: 0.1 10*3/uL (ref 0–0.7)
Eosinophils Relative: 1 %
HCT: 44.5 % (ref 35.0–47.0)
Hemoglobin: 15.3 g/dL (ref 12.0–16.0)
Lymphocytes Relative: 11 %
Lymphs Abs: 0.8 10*3/uL — ABNORMAL LOW (ref 1.0–3.6)
MCH: 35.1 pg — ABNORMAL HIGH (ref 26.0–34.0)
MCHC: 34.4 g/dL (ref 32.0–36.0)
MCV: 102.2 fL — ABNORMAL HIGH (ref 80.0–100.0)
Monocytes Absolute: 0.7 10*3/uL (ref 0.2–0.9)
Monocytes Relative: 9 %
Neutro Abs: 5.4 10*3/uL (ref 1.4–6.5)
Neutrophils Relative %: 78 %
Platelets: 264 10*3/uL (ref 150–440)
RBC: 4.36 MIL/uL (ref 3.80–5.20)
RDW: 17.2 % — ABNORMAL HIGH (ref 11.5–14.5)
WBC: 7.1 10*3/uL (ref 3.6–11.0)

## 2016-06-03 MED ORDER — SODIUM CHLORIDE 0.9 % IV SOLN
200.0000 mg | Freq: Once | INTRAVENOUS | Status: AC
  Administered 2016-06-03: 200 mg via INTRAVENOUS
  Filled 2016-06-03 (×2): qty 10

## 2016-06-03 MED ORDER — SODIUM CHLORIDE 0.9 % IV SOLN
Freq: Once | INTRAVENOUS | Status: AC
  Administered 2016-06-03: 14:00:00 via INTRAVENOUS
  Filled 2016-06-03: qty 1000

## 2016-06-03 MED ORDER — CYANOCOBALAMIN 1000 MCG/ML IJ SOLN
1000.0000 ug | Freq: Once | INTRAMUSCULAR | Status: AC
  Administered 2016-06-03: 1000 ug via INTRAMUSCULAR
  Filled 2016-06-03: qty 1

## 2016-06-09 ENCOUNTER — Encounter: Payer: Self-pay | Admitting: Hematology and Oncology

## 2016-07-08 ENCOUNTER — Encounter: Payer: Self-pay | Admitting: Hematology and Oncology

## 2016-07-08 ENCOUNTER — Inpatient Hospital Stay: Payer: BLUE CROSS/BLUE SHIELD

## 2016-07-08 ENCOUNTER — Inpatient Hospital Stay: Payer: BLUE CROSS/BLUE SHIELD | Attending: Hematology and Oncology | Admitting: Hematology and Oncology

## 2016-07-08 VITALS — BP 114/73 | HR 80 | Temp 98.1°F | Resp 18 | Wt 123.7 lb

## 2016-07-08 DIAGNOSIS — K219 Gastro-esophageal reflux disease without esophagitis: Secondary | ICD-10-CM

## 2016-07-08 DIAGNOSIS — F5089 Other specified eating disorder: Secondary | ICD-10-CM

## 2016-07-08 DIAGNOSIS — D7589 Other specified diseases of blood and blood-forming organs: Secondary | ICD-10-CM | POA: Diagnosis not present

## 2016-07-08 DIAGNOSIS — D508 Other iron deficiency anemias: Secondary | ICD-10-CM | POA: Diagnosis not present

## 2016-07-08 DIAGNOSIS — G4730 Sleep apnea, unspecified: Secondary | ICD-10-CM

## 2016-07-08 DIAGNOSIS — E538 Deficiency of other specified B group vitamins: Secondary | ICD-10-CM

## 2016-07-08 DIAGNOSIS — R51 Headache: Secondary | ICD-10-CM

## 2016-07-08 DIAGNOSIS — I10 Essential (primary) hypertension: Secondary | ICD-10-CM | POA: Diagnosis not present

## 2016-07-08 DIAGNOSIS — Z880 Allergy status to penicillin: Secondary | ICD-10-CM | POA: Diagnosis not present

## 2016-07-08 DIAGNOSIS — D518 Other vitamin B12 deficiency anemias: Secondary | ICD-10-CM

## 2016-07-08 DIAGNOSIS — J45909 Unspecified asthma, uncomplicated: Secondary | ICD-10-CM

## 2016-07-08 DIAGNOSIS — Z87891 Personal history of nicotine dependence: Secondary | ICD-10-CM | POA: Diagnosis not present

## 2016-07-08 DIAGNOSIS — T39091A Poisoning by salicylates, accidental (unintentional), initial encounter: Secondary | ICD-10-CM

## 2016-07-08 DIAGNOSIS — M8580 Other specified disorders of bone density and structure, unspecified site: Secondary | ICD-10-CM | POA: Diagnosis not present

## 2016-07-08 DIAGNOSIS — G2581 Restless legs syndrome: Secondary | ICD-10-CM | POA: Diagnosis not present

## 2016-07-08 DIAGNOSIS — Z9884 Bariatric surgery status: Secondary | ICD-10-CM

## 2016-07-08 DIAGNOSIS — Z79899 Other long term (current) drug therapy: Secondary | ICD-10-CM

## 2016-07-08 DIAGNOSIS — H9319 Tinnitus, unspecified ear: Secondary | ICD-10-CM

## 2016-07-08 DIAGNOSIS — F419 Anxiety disorder, unspecified: Secondary | ICD-10-CM

## 2016-07-08 DIAGNOSIS — E119 Type 2 diabetes mellitus without complications: Secondary | ICD-10-CM

## 2016-07-08 LAB — COMPREHENSIVE METABOLIC PANEL
ALT: 11 U/L — ABNORMAL LOW (ref 14–54)
AST: 18 U/L (ref 15–41)
Albumin: 4.3 g/dL (ref 3.5–5.0)
Alkaline Phosphatase: 74 U/L (ref 38–126)
Anion gap: 15 (ref 5–15)
BUN: 22 mg/dL — ABNORMAL HIGH (ref 6–20)
CO2: 22 mmol/L (ref 22–32)
Calcium: 9.2 mg/dL (ref 8.9–10.3)
Chloride: 106 mmol/L (ref 101–111)
Creatinine, Ser: 0.75 mg/dL (ref 0.44–1.00)
GFR calc Af Amer: >60 mL/min (ref >60)
GFR calc non Af Amer: >60 mL/min (ref >60)
Glucose, Bld: 103 mg/dL — ABNORMAL HIGH (ref 65–99)
Potassium: 3.5 mmol/L (ref 3.5–5.1)
Sodium: 143 mmol/L (ref 135–145)
Total Bilirubin: 0.2 mg/dL — ABNORMAL LOW (ref 0.3–1.2)
Total Protein: 7.9 g/dL (ref 6.5–8.1)

## 2016-07-08 LAB — CBC WITH DIFFERENTIAL/PLATELET
Basophils Absolute: 0.1 10*3/uL (ref 0–0.1)
Basophils Relative: 1 %
Eosinophils Absolute: 0.1 10*3/uL (ref 0–0.7)
Eosinophils Relative: 1 %
HCT: 46.6 % (ref 35.0–47.0)
Hemoglobin: 15.8 g/dL (ref 12.0–16.0)
Lymphocytes Relative: 13 %
Lymphs Abs: 0.8 10*3/uL — ABNORMAL LOW (ref 1.0–3.6)
MCH: 35.7 pg — ABNORMAL HIGH (ref 26.0–34.0)
MCHC: 34 g/dL (ref 32.0–36.0)
MCV: 105.1 fL — ABNORMAL HIGH (ref 80.0–100.0)
Monocytes Absolute: 0.5 10*3/uL (ref 0.2–0.9)
Monocytes Relative: 9 %
Neutro Abs: 4.7 10*3/uL (ref 1.4–6.5)
Neutrophils Relative %: 76 %
Platelets: 276 10*3/uL (ref 150–440)
RBC: 4.44 MIL/uL (ref 3.80–5.20)
RDW: 17.4 % — ABNORMAL HIGH (ref 11.5–14.5)
WBC: 6.2 10*3/uL (ref 3.6–11.0)

## 2016-07-08 LAB — FERRITIN: Ferritin: 14 ng/mL (ref 11–307)

## 2016-07-08 LAB — TSH: TSH: 1.563 u[IU]/mL (ref 0.350–4.500)

## 2016-07-08 LAB — RETICULOCYTES
RBC.: 4.44 MIL/uL (ref 3.80–5.20)
Retic Count, Absolute: 75.5 10*3/uL (ref 19.0–183.0)
Retic Ct Pct: 1.7 % (ref 0.4–3.1)

## 2016-07-08 LAB — FOLATE: Folate: 11.4 ng/mL (ref >5.9)

## 2016-07-08 LAB — SALICYLATE LEVEL: Salicylate Lvl: 39.6 mg/dL (ref 2.8–30.0)

## 2016-07-08 MED ORDER — CYANOCOBALAMIN 1000 MCG/ML IJ SOLN
1000.0000 ug | Freq: Once | INTRAMUSCULAR | Status: AC
  Administered 2016-07-08: 1000 ug via INTRAMUSCULAR
  Filled 2016-07-08: qty 1

## 2016-07-08 NOTE — Progress Notes (Signed)
[facility] day:  07/08/16  Chief Complaint: Brenda Hawkins is a 50 y.o. female status post gastric bypass surgery with iron deficiency anemia and B12 deficiency who is seen for 3 month assessment.  HPI: The patient was last seen in medical oncology clinic on 04/01/2016.  At that time, she complained of restless legs.  Exam was stable.  Hematocrit was 42.5  Ferritin was 6 (low).  She received B12.  I encouraged smoking cessation, follow-up colonoscopy and mammogram, and sleep apnea testing/overnight oximetry.  CBC on 05/06/2016 included a hematocrit of 40.7, hemoglobin 13.8, and MCV 99.6.  CBC on 06/03/2016 included a hematocrit of 44.5, hemoglobin 15.3, and MCV 102.2.  She has received B12 monthly (07/24, 08/28, 06/03/2016).  During the interim, she has felt to good.  Weight is down 3 pounds.  Diet is "terrible".  She typically eats 1-2 meals a day. She eats toast for breakfast then eats junk food smothered in a cheese for supper.   She describes "food comas".   She eats and then feels like she needs to take a nap.    She is not using CPAP. She describes no troubles sleeping.  Her restless legs are better. She continues to smoke 1-1/2 packs a day.  She notes a history of chronic headaches ("years and years).  She admits to taking BC powders by the box. She states that she can take 50 in a week. She has already had 6 today.  She notes tinnitus.   Past Medical History:  Diagnosis Date  . Anemia   . Anxiety   . Asthma   . Diabetes mellitus type II   . GERD (gastroesophageal reflux disease)   . Hypertension   . Obesity   . Osteopenia   . Sleep apnea   . 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 Vitamin B12 deficiency    since bariatric surgery    Past Surgical History:  Procedure Laterality Date  . GASTRIC BYPASS  03/17/02    Family History  Problem Relation Age of Onset  . Heart disease Mother     valve replacement surgery  . Coronary artery disease  Father     Social History:  reports that she has quit smoking. She has never used smokeless tobacco. She reports that she does not drink alcohol or use drugs.  She quit smoking > 20 years ago, but started smoking again 3-4 years ago.  She smokes 1 1/2 pack a day.  The patient is alone today.  Allergies:  Allergies  Allergen Reactions  . Codeine Sulfate   . Penicillins     REACTION: questionable    Current Medications: Current Outpatient Prescriptions  Medication Sig Dispense Refill  . albuterol (PROVENTIL,VENTOLIN) 90 MCG/ACT inhaler Inhale 2 puffs into the lungs every 6 (six) hours as needed for wheezing. Use 2 puffs 4 times a day as needed. 17 g 2  . calcium citrate-vitamin D (CITRACAL+D) 315-200 MG-UNIT per tablet Take 1 tablet by mouth daily. Reported on 10/09/2015    . cyanocobalamin (,VITAMIN B-12,) 1000 MCG/ML injection Inject into the muscle. One injection every 6 months.     . Multiple Vitamin (MULTIVITAMIN) tablet Take 1 tablet by mouth daily. Reported on 10/09/2015    . rOPINIRole (REQUIP) 1 MG tablet Use in the evening for restless leg as needed in addition to bedtime dose of 3 mg daily. (Patient not taking: Reported on 07/08/2016) 30 tablet 0  . rOPINIRole (REQUIP) 3 MG tablet  Take 1 tablet (3 mg total) by mouth at bedtime. (Patient not taking: Reported on 07/08/2016) 30 tablet 0  . triamcinolone (AZMACORT) 75 MCG/ACT inhaler Inhale 2 puffs into the lungs as needed. Reported on 10/09/2015     No current facility-administered medications for this visit.    Facility-Administered Medications Ordered in Other Visits  Medication Dose Route Frequency Provider Last Rate Last Dose  . sodium chloride 0.9 % injection 10 mL  10 mL Intracatheter PRN Lloyd Huger, MD        Review of Systems:  GENERAL: Feels good.  No fevers or sweats. Weight down 3 pounds. PERFORMANCE STATUS (ECOG):  0 HEENT:  Tinnitus.  No visual changes.  No sore throat, runny nose, sore throat, mouth sores or  tenderness. Lungs: No shortness of breath.  No hemoptysis.  h/o sleep apnea, not using CPAP. Cardiac:  No chest pain, palpitations, orthopnea, or PND. GI:  Ice pica (sightly better).  No diarrhea, constipation, melena or hematochezia. GU:  No urgency, frequency, dysuria, or hematuria. Musculoskeletal:  No back pain.  No joint pain.  No muscle tenderness. Extremities:  Sometimes feet swell.  No change in color. Skin:  No rashes or skin changes. Neuro:  Restless leg, improved.  No numbness or weakness, balance or coordination issues. Endocrine:  No diabetes, thyroid issues, hot flashes or night sweats. Psych:  No mood changes, depression or anxiety.  Sleeping better. Pain:  No focal pain. Review of systems:  All other systems reviewed and found to be negative.   Physical Exam: 126 Blood pressure 114/73, pulse 80, temperature 98.1 F (36.7 C), temperature source Tympanic, resp. rate 18, weight 123 lb 10.9 oz (56.1 kg). GENERAL:  Well developed, well nourished, woman sitting comfortably in the exam room in no acute distress. MENTAL STATUS:  Alert and oriented to person, place and time. HEAD:  Short curly blonde hair.  Normocephalic, atraumatic, face symmetric, no Cushingoid features. EYES:  Green eyes.  Pupils equal round and reactive to light and accomodation.  No conjunctivitis or scleral icterus. ENT:  Oropharynx clear without lesion.  Tongue normal. Mucous membranes moist.  RESPIRATORY:  Clear to auscultation without rales, wheezes or rhonchi. CARDIOVASCULAR:  Regular rate and rhythm without murmur, rub or gallop. ABDOMEN:  Soft, non-tender, with active bowel sounds, and no hepatosplenomegaly.  No masses. SKIN:  No rashes, ulcers or lesions. EXTREMITIES: No edema, no skin discoloration or tenderness.  No palpable cords. LYMPH NODES: No palpable cervical, supraclavicular, axillary or inguinal adenopathy  NEUROLOGICAL: Unremarkable. PSYCH:  Appropriate.    Appointment on 07/08/2016   Component Date Value Ref Range Status  . WBC 07/08/2016 6.2  3.6 - 11.0 K/uL Final  . RBC 07/08/2016 4.44  3.80 - 5.20 MIL/uL Final  . Hemoglobin 07/08/2016 15.8  12.0 - 16.0 g/dL Final  . HCT 07/08/2016 46.6  35.0 - 47.0 % Final  . MCV 07/08/2016 105.1* 80.0 - 100.0 fL Final  . MCH 07/08/2016 35.7* 26.0 - 34.0 pg Final  . MCHC 07/08/2016 34.0  32.0 - 36.0 g/dL Final  . RDW 07/08/2016 17.4* 11.5 - 14.5 % Final  . Platelets 07/08/2016 276  150 - 440 K/uL Final  . Neutrophils Relative % 07/08/2016 76  % Final  . Neutro Abs 07/08/2016 4.7  1.4 - 6.5 K/uL Final  . Lymphocytes Relative 07/08/2016 13  % Final  . Lymphs Abs 07/08/2016 0.8* 1.0 - 3.6 K/uL Final  . Monocytes Relative 07/08/2016 9  % Final  . Monocytes Absolute  07/08/2016 0.5  0.2 - 0.9 K/uL Final  . Eosinophils Relative 07/08/2016 1  % Final  . Eosinophils Absolute 07/08/2016 0.1  0 - 0.7 K/uL Final  . Basophils Relative 07/08/2016 1  % Final  . Basophils Absolute 07/08/2016 0.1  0 - 0.1 K/uL Final  . Sodium 07/08/2016 143  135 - 145 mmol/L Final  . Potassium 07/08/2016 3.5  3.5 - 5.1 mmol/L Final  . Chloride 07/08/2016 106  101 - 111 mmol/L Final  . CO2 07/08/2016 22  22 - 32 mmol/L Final  . Glucose, Bld 07/08/2016 103* 65 - 99 mg/dL Final  . BUN 07/08/2016 22* 6 - 20 mg/dL Final  . Creatinine, Ser 07/08/2016 0.75  0.44 - 1.00 mg/dL Final  . Calcium 07/08/2016 9.2  8.9 - 10.3 mg/dL Final  . Total Protein 07/08/2016 7.9  6.5 - 8.1 g/dL Final  . Albumin 07/08/2016 4.3  3.5 - 5.0 g/dL Final  . AST 07/08/2016 18  15 - 41 U/L Final  . ALT 07/08/2016 11* 14 - 54 U/L Final  . Alkaline Phosphatase 07/08/2016 74  38 - 126 U/L Final  . Total Bilirubin 07/08/2016 0.2* 0.3 - 1.2 mg/dL Final  . GFR calc non Af Amer 07/08/2016 >60  >60 mL/min Final  . GFR calc Af Amer 07/08/2016 >60  >60 mL/min Final   Comment: (NOTE) The eGFR has been calculated using the CKD EPI equation. This calculation has not been validated in all clinical  situations. eGFR's persistently <60 mL/min signify possible Chronic Kidney Disease.   . Anion gap 07/08/2016 15  5 - 15 Final  . Retic Ct Pct 07/08/2016 1.7  0.4 - 3.1 % Final  . RBC. 07/08/2016 4.44  3.80 - 5.20 MIL/uL Final  . Retic Count, Manual 07/08/2016 75.5  19.0 - 183.0 K/uL Final    Assessment:  Brenda Hawkins is a 50 y.o. female status post gastric bypass surgery in 03/2002 and subsequent development of iron deficiency anemia and B12 deficiency.  She is intolerant of oral iron.  She denies any melena or hematochezia.  Stool guaiacs were negative on 01/02/2015.  She received her 4 infusions of Venofer (05/09 - 02/07/2015). She receives B12 (began 01/16/2015; last 06/03/2016). Hematocrit initially improved from 30.0 to 42.8 to 46.6.  She received Venofer 200 mg IV on 10/10/2015.  Restless legs have improve.  Ice pica is slightly better.  Diet is poor.  She denies any melena or hematochezia.  She has not had a colonoscopy.  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  She denies any liver disease.  Thyroid studies have been normal in the past.  Labs on 07/08/2016 revealed the following normal labs:  folate (11.4), TSH (1.563), and reticulocyte count (1.7%).  She has a history of sleep apnea.  She previously used CPAP when she was heavier.  She is not using CPAP now.   She smokes 1 1/2 packs per day.  She has an increasing hematocrit despite low iron stores suggestive of secondary erythrocytosis due to smoking, sleep apnea, or a myeloproliferative disorder.  Symptomatically, she has chronic headaches x 2 years.  She takes excessive BC powders.  She has tinnitus.  Weight is down 3 pounds.  Exam is stable.  Hematocrit is 46.6  Ferritin is 14 (low).  Plan: 1.  Labs today:  CBC with diff, ferritin. 2.  Add today:  salicylate level. 3.  Discuss concern for salicylate toxicity given excess use of BC powders and tinnitus.  Encourage discontinuation.  Check level. 4.   B12 today. 5.  Discuss smoking cessation. 6.  Follow-up colonoscopy and mammogram.  Remind after her next birthday (per patient request). 7.  Encourage sleep apnea testing/overnight oximetry. 8.  RTC in 1 month for MD assessment, labs (CBC with diff +/- others) + B12.  Addendum:  Critical lab called.  Salicylate level was 74.7 (2.8-30).  The patient will be called about evaluation and management in the emergency room.   Lequita Asal, MD  07/08/2016, 1:54 PM

## 2016-07-09 ENCOUNTER — Emergency Department
Admission: EM | Admit: 2016-07-09 | Discharge: 2016-07-09 | Disposition: A | Discharge status: Home / Self Care | Payer: BLUE CROSS/BLUE SHIELD | Attending: Emergency Medicine | Admitting: Emergency Medicine

## 2016-07-09 ENCOUNTER — Encounter: Payer: Self-pay | Admitting: Emergency Medicine

## 2016-07-09 DIAGNOSIS — I10 Essential (primary) hypertension: Secondary | ICD-10-CM | POA: Diagnosis not present

## 2016-07-09 DIAGNOSIS — J45909 Unspecified asthma, uncomplicated: Secondary | ICD-10-CM | POA: Insufficient documentation

## 2016-07-09 DIAGNOSIS — T39091A Poisoning by salicylates, accidental (unintentional), initial encounter: Secondary | ICD-10-CM | POA: Diagnosis present

## 2016-07-09 DIAGNOSIS — E119 Type 2 diabetes mellitus without complications: Secondary | ICD-10-CM | POA: Diagnosis not present

## 2016-07-09 DIAGNOSIS — Z87891 Personal history of nicotine dependence: Secondary | ICD-10-CM | POA: Insufficient documentation

## 2016-07-09 LAB — BASIC METABOLIC PANEL
ANION GAP: 9 (ref 5–15)
BUN: 21 mg/dL — AB (ref 6–20)
CALCIUM: 9.5 mg/dL (ref 8.9–10.3)
CO2: 25 mmol/L (ref 22–32)
Chloride: 109 mmol/L (ref 101–111)
Creatinine, Ser: 0.77 mg/dL (ref 0.44–1.00)
GFR calc Af Amer: >60 mL/min (ref >60)
GLUCOSE: 101 mg/dL — AB (ref 65–99)
POTASSIUM: 3.5 mmol/L (ref 3.5–5.1)
SODIUM: 143 mmol/L (ref 135–145)

## 2016-07-09 LAB — BLOOD GAS, VENOUS
ACID-BASE EXCESS: 5.5 mmol/L — AB (ref 0.0–2.0)
Bicarbonate: 31.1 mmol/L — ABNORMAL HIGH (ref 20.0–28.0)
FIO2: 0.21
PATIENT TEMPERATURE: 37
PCO2 VEN: 48 mmHg (ref 44.0–60.0)
pH, Ven: 7.42 (ref 7.250–7.430)

## 2016-07-09 LAB — URINALYSIS COMPLETE WITH MICROSCOPIC (ARMC ONLY)
BACTERIA UA: NONE SEEN
BILIRUBIN URINE: NEGATIVE
Glucose, UA: NEGATIVE mg/dL
Leukocytes, UA: NEGATIVE
NITRITE: NEGATIVE
PH: 5 (ref 5.0–8.0)
Protein, ur: 30 mg/dL — AB
Specific Gravity, Urine: 1.028 (ref 1.005–1.030)

## 2016-07-09 LAB — CBC WITH DIFFERENTIAL/PLATELET
BASOS ABS: 0 10*3/uL (ref 0–0.1)
Basophils Relative: 1 %
EOS ABS: 0.1 10*3/uL (ref 0–0.7)
EOS PCT: 1 %
HCT: 46.6 % (ref 35.0–47.0)
Hemoglobin: 15.6 g/dL (ref 12.0–16.0)
LYMPHS PCT: 15 %
Lymphs Abs: 0.7 10*3/uL — ABNORMAL LOW (ref 1.0–3.6)
MCH: 35.5 pg — AB (ref 26.0–34.0)
MCHC: 33.4 g/dL (ref 32.0–36.0)
MCV: 106.1 fL — AB (ref 80.0–100.0)
MONO ABS: 0.4 10*3/uL (ref 0.2–0.9)
Monocytes Relative: 8 %
Neutro Abs: 3.3 10*3/uL (ref 1.4–6.5)
Neutrophils Relative %: 75 %
PLATELETS: 257 10*3/uL (ref 150–440)
RBC: 4.39 MIL/uL (ref 3.80–5.20)
RDW: 17.5 % — AB (ref 11.5–14.5)
WBC: 4.4 10*3/uL (ref 3.6–11.0)

## 2016-07-09 LAB — SALICYLATE LEVEL
SALICYLATE LVL: 23.6 mg/dL (ref 2.8–30.0)
SALICYLATE LVL: 16.3 mg/dL (ref 2.8–30.0)

## 2016-07-09 LAB — ACETAMINOPHEN LEVEL: Acetaminophen (Tylenol), Serum: <10 ug/mL — ABNORMAL LOW (ref 10–30)

## 2016-07-09 MED ORDER — SODIUM BICARBONATE 8.4 % IV SOLN
50.0000 meq | Freq: Once | INTRAVENOUS | Status: AC
  Administered 2016-07-09: 50 meq via INTRAVENOUS
  Filled 2016-07-09: qty 50

## 2016-07-09 MED ORDER — SODIUM CHLORIDE 0.9 % IV BOLUS (SEPSIS)
1000.0000 mL | Freq: Once | INTRAVENOUS | Status: AC
  Administered 2016-07-09: 1000 mL via INTRAVENOUS

## 2016-07-09 NOTE — Discharge Instructions (Signed)
Avoid BC powder, alleve, ibuprofen, naprosyn, motrin, or any other NSAIDs as it may cause severe damage to your kidneys and your stomach. [facility] for [facility] of your chronic headaches.

## 2016-07-09 NOTE — ED Provider Notes (Signed)
[facility] Emergency Department Provider Note  ____________________________________________  Time seen: Approximately 1:47 PM  I have reviewed the triage vital signs and the nursing notes.   HISTORY  Chief Complaint Abnormal Lab   HPI Belinda Blockeresa Carol Krogstad is a 50 y.o. female history of bariatric surgery complicated by B12 deficiency anemia, diabetes, hypertension, and chronic headaches who presents at the request of Dr. Merlene Pullingorcoran for elevated salicylate levels. According to the patient she has been taking 50 BC powders a week for multiple years for her chronic headaches. She reports that she has never been evaluated for those headaches even though she has talked to her primary care doctor multiple times. She has never seen a neurologist or had any imaging of her head. She reports that her headaches are worse in the morning, frontal, throbbing, moderate to severe and she has them on a daily basis. She currently has 6/10 headache. She does report taking 1 BC powder this morning. She saw Dr. Merlene Pullingorcoran yesterday who was concerned about possible overdose on salicylates and check a salicylate level which was elevated at 39.6. She then called the patient's they recommended her to come to the emergency room. Patient reports tinnitus that has been going on for multiple years. She denies chest pain, shortness of breath, abdominal pain, nausea, vomiting, diarrhea.  Past Medical History:  Diagnosis Date  . Anemia   . Anxiety   . Asthma   . Diabetes mellitus type II   . GERD (gastroesophageal reflux disease)   . Hypertension   . Obesity   . Osteopenia   . Sleep apnea   . 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 Vitamin B12 deficiency    since bariatric surgery    Patient Active Problem List   Diagnosis Date Noted  . Salicylate poisoning 07/08/2016  . Peripheral edema 03/24/2014  . HYPERCHOLESTEROLEMIA 08/11/2008  . Anemia, iron deficiency 08/11/2008  . Diabetes mellitus type II,  controlled (HCC) 06/02/2007  . Osteopenia 06/02/2007  . OBESITY 12/19/2006  . ANEMIA, VITAMIN B12 DEFICIENCY 12/19/2006  . ANXIETY 12/19/2006  . 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 Essential hypertension, benign 12/19/2006  . ASTHMA 12/19/2006  . GERD 12/19/2006  . SLEEP APNEA 12/19/2006    Past Surgical History:  Procedure Laterality Date  . GASTRIC BYPASS  03/17/02    Prior to Admission medications   Not on File    Allergies Codeine sulfate and Penicillins  Family History  Problem Relation Age of Onset  . Heart disease Mother     valve replacement surgery  . Coronary artery disease Father     Social History Social History  Substance Use Topics  . Smoking status: Former Games developermoker  . Smokeless tobacco: Never Used     Comment: Quit 1998  . Alcohol use No    Review of Systems  Constitutional: Negative for fever. Eyes: Negative for visual changes. ENT: Negative for sore throat. + tinnitus Cardiovascular: Negative for chest pain. Respiratory: Negative for shortness of breath. Gastrointestinal: Negative for abdominal pain, vomiting or diarrhea. Genitourinary: Negative for dysuria. Musculoskeletal: Negative for back pain. Skin: Negative for rash. Neurological: Negative for headaches, weakness or numbness.  ____________________________________________   PHYSICAL EXAM:  VITAL SIGNS: ED Triage Vitals  Enc Vitals Group     BP 07/09/16 1041 (!) 182/87     Pulse Rate 07/09/16 1041 85     Resp 07/09/16 1041 18     Temp 07/09/16 1041 98.5 F (36.9 C)     Temp Source  07/09/16 1041 Oral     SpO2 07/09/16 1041 99 %     Weight 07/09/16 1042 120 lb (54.4 kg)     Height 07/09/16 1042 5' (1.524 m)     Head Circumference --      Peak Flow --      Pain Score 07/09/16 1042 0     Pain Loc --      Pain Edu? --      Excl. in GC? --     Constitutional: Alert and oriented. Well appearing and in no apparent distress. HEENT:      Head: Normocephalic and  atraumatic.         Eyes: Conjunctivae are normal. Sclera is non-icteric. EOMI. PERRL      Mouth/Throat: Mucous membranes are moist.       Neck: Supple with no signs of meningismus. Cardiovascular: Regular rate and rhythm. No murmurs, gallops, or rubs. 2+ symmetrical distal pulses are present in all extremities. No JVD. Respiratory: Normal respiratory effort. Lungs are clear to auscultation bilaterally. No wheezes, crackles, or rhonchi.  Gastrointestinal: Soft, non tender, and non distended with positive bowel sounds. No rebound or guarding. Genitourinary: No CVA tenderness. Musculoskeletal: Nontender with normal range of motion in all extremities. No edema, cyanosis, or erythema of extremities. Neurologic: Normal speech and language. Face is symmetric. Moving all extremities. No gross focal neurologic deficits are appreciated. Skin: Skin is warm, dry and intact. No rash noted. Psychiatric: Mood and affect are normal. Speech and behavior are normal.  ____________________________________________   LABS (all labs ordered are listed, but only abnormal results are displayed)  Labs Reviewed  CBC WITH DIFFERENTIAL/PLATELET - Abnormal; Notable for the following:       Result Value   MCV 106.1 (*)    MCH 35.5 (*)    RDW 17.5 (*)    Lymphs Abs 0.7 (*)    All other components within normal limits  BASIC METABOLIC PANEL - Abnormal; Notable for the following:    Glucose, Bld 101 (*)    BUN 21 (*)    All other components within normal limits  ACETAMINOPHEN LEVEL - Abnormal; Notable for the following:    Acetaminophen (Tylenol), Serum <10 (*)    All other components within normal limits  BLOOD GAS, VENOUS - Abnormal; Notable for the following:    Bicarbonate 31.1 (*)    Acid-Base Excess 5.5 (*)    All other components within normal limits  URINALYSIS COMPLETEWITH MICROSCOPIC ([facility] ONLY) - Abnormal; Notable for the following:    Color, Urine YELLOW (*)    APPearance CLEAR (*)    Ketones, ur  TRACE (*)    Hgb urine dipstick 1+ (*)    Protein, ur 30 (*)    Squamous Epithelial / LPF 0-5 (*)    All other components within normal limits  SALICYLATE LEVEL  SALICYLATE LEVEL   ____________________________________________  EKG  none ____________________________________________  RADIOLOGY  none  ____________________________________________   PROCEDURES  Procedure(s) performed: None Procedures Critical Care performed:  None ____________________________________________   INITIAL IMPRESSION / ASSESSMENT AND PLAN / ED COURSE   50 y.o. female history of bariatric surgery complicated by B12 deficiency anemia, diabetes, hypertension, and chronic headaches who presents at the request of Dr. Merlene Pulling for elevated salicylate levels due to chronic ingestion of BC powder (50 to week for many years. Patient with tinnitus which is also chronic. Salicylate level here is within normal limits at 23.6. I spoke with the poison control center who recommended  hydrating repeating another level within 3 hours and he feeds trending down were stable patient is cleared for discharge. I spent a lot of time counseling patient about side effects associated with chronic usage of NSAIDs. I have also offered to get a head CT to evaluate for any evidence of tumor this patient's headaches are worse in the morning although I have low suspicion with completely neuro intact exam. Patient has refused a head CT and would like to speak with her primary care doctor before undergoing this test. [facility].  Clinical Course    Pertinent labs & imaging results that were available during my care of the patient were reviewed by me and considered in my medical decision making (see chart for details).    ____________________________________________   FINAL CLINICAL IMPRESSION(S) / ED DIAGNOSES  Final diagnoses:  Salicylate overdose, accidental or unintentional,  initial encounter      NEW MEDICATIONS STARTED DURING THIS VISIT:  There are no discharge medications for this patient.    Note:  This document was prepared using Dragon voice recognition software and may include unintentional dictation errors.    Nita Sicklearolina Kristilyn Coltrane, MD 07/09/16 2203

## 2016-07-09 NOTE — ED Triage Notes (Signed)
Pt presents to ED with reports of abnormal lab values drawn by Dr. Merlene Pullingorcoran. Pt denies any symptoms.

## 2016-08-06 ENCOUNTER — Inpatient Hospital Stay: Payer: BLUE CROSS/BLUE SHIELD

## 2016-08-06 ENCOUNTER — Other Ambulatory Visit: Payer: Self-pay | Admitting: Hematology and Oncology

## 2016-08-06 ENCOUNTER — Encounter: Payer: Self-pay | Admitting: Hematology and Oncology

## 2016-08-06 ENCOUNTER — Inpatient Hospital Stay: Payer: BLUE CROSS/BLUE SHIELD | Attending: Hematology and Oncology | Admitting: Hematology and Oncology

## 2016-08-06 VITALS — BP 144/86 | HR 86 | Temp 97.6°F | Resp 18 | Wt 120.2 lb

## 2016-08-06 DIAGNOSIS — F5089 Other specified eating disorder: Secondary | ICD-10-CM | POA: Diagnosis not present

## 2016-08-06 DIAGNOSIS — Z9884 Bariatric surgery status: Secondary | ICD-10-CM | POA: Diagnosis not present

## 2016-08-06 DIAGNOSIS — G2581 Restless legs syndrome: Secondary | ICD-10-CM | POA: Diagnosis not present

## 2016-08-06 DIAGNOSIS — R634 Abnormal weight loss: Secondary | ICD-10-CM

## 2016-08-06 DIAGNOSIS — K219 Gastro-esophageal reflux disease without esophagitis: Secondary | ICD-10-CM | POA: Diagnosis not present

## 2016-08-06 DIAGNOSIS — M8580 Other specified disorders of bone density and structure, unspecified site: Secondary | ICD-10-CM

## 2016-08-06 DIAGNOSIS — E538 Deficiency of other specified B group vitamins: Secondary | ICD-10-CM | POA: Diagnosis not present

## 2016-08-06 DIAGNOSIS — G4730 Sleep apnea, unspecified: Secondary | ICD-10-CM | POA: Diagnosis not present

## 2016-08-06 DIAGNOSIS — I10 Essential (primary) hypertension: Secondary | ICD-10-CM | POA: Diagnosis not present

## 2016-08-06 DIAGNOSIS — Z880 Allergy status to penicillin: Secondary | ICD-10-CM | POA: Diagnosis not present

## 2016-08-06 DIAGNOSIS — D518 Other vitamin B12 deficiency anemias: Secondary | ICD-10-CM

## 2016-08-06 DIAGNOSIS — F17210 Nicotine dependence, cigarettes, uncomplicated: Secondary | ICD-10-CM

## 2016-08-06 DIAGNOSIS — J45909 Unspecified asthma, uncomplicated: Secondary | ICD-10-CM | POA: Diagnosis not present

## 2016-08-06 DIAGNOSIS — Z79899 Other long term (current) drug therapy: Secondary | ICD-10-CM | POA: Insufficient documentation

## 2016-08-06 DIAGNOSIS — D509 Iron deficiency anemia, unspecified: Secondary | ICD-10-CM

## 2016-08-06 DIAGNOSIS — E669 Obesity, unspecified: Secondary | ICD-10-CM | POA: Diagnosis not present

## 2016-08-06 DIAGNOSIS — D508 Other iron deficiency anemias: Secondary | ICD-10-CM

## 2016-08-06 DIAGNOSIS — H9319 Tinnitus, unspecified ear: Secondary | ICD-10-CM | POA: Diagnosis not present

## 2016-08-06 DIAGNOSIS — E119 Type 2 diabetes mellitus without complications: Secondary | ICD-10-CM | POA: Diagnosis not present

## 2016-08-06 DIAGNOSIS — R51 Headache: Secondary | ICD-10-CM | POA: Insufficient documentation

## 2016-08-06 LAB — CBC WITH DIFFERENTIAL/PLATELET
Basophils Absolute: 0 10*3/uL (ref 0–0.1)
Basophils Relative: 1 %
Eosinophils Absolute: 0 10*3/uL (ref 0–0.7)
Eosinophils Relative: 1 %
HCT: 45.8 % (ref 35.0–47.0)
Hemoglobin: 15.8 g/dL (ref 12.0–16.0)
Lymphocytes Relative: 20 %
Lymphs Abs: 0.5 10*3/uL — ABNORMAL LOW (ref 1.0–3.6)
MCH: 36.2 pg — ABNORMAL HIGH (ref 26.0–34.0)
MCHC: 34.6 g/dL (ref 32.0–36.0)
MCV: 104.8 fL — ABNORMAL HIGH (ref 80.0–100.0)
Monocytes Absolute: 0.3 10*3/uL (ref 0.2–0.9)
Monocytes Relative: 13 %
Neutro Abs: 1.6 10*3/uL (ref 1.4–6.5)
Neutrophils Relative %: 65 %
Platelets: 161 10*3/uL (ref 150–440)
RBC: 4.37 MIL/uL (ref 3.80–5.20)
RDW: 14.9 % — ABNORMAL HIGH (ref 11.5–14.5)
WBC: 2.4 10*3/uL — ABNORMAL LOW (ref 3.6–11.0)

## 2016-08-06 LAB — FERRITIN: Ferritin: 13 ng/mL (ref 11–307)

## 2016-08-06 MED ORDER — CYANOCOBALAMIN 1000 MCG/ML IJ SOLN
1000.0000 ug | Freq: Once | INTRAMUSCULAR | Status: AC
  Administered 2016-08-06: 1000 ug via INTRAMUSCULAR
  Filled 2016-08-06: qty 1

## 2016-08-06 NOTE — Progress Notes (Addendum)
[facility]-  Cancer Center  Clinic day:  08/06/16  Chief Complaint: Brenda Hawkins is a 50 y.o. female status post gastric bypass surgery with iron deficiency anemia and B12 deficiency who is seen for 1 month assessment.  HPI: The patient was last seen in medical oncology clinic on 07/08/2016.  At that time, she described chronic headaches x 2 years.  She was taking  excessive BC powders.  She had tinnitus.  Weight was down 3 pounds.  Hematocrit was 46.6  Ferritin was 14 (low).  She received B12.  We discussed discontinuation of BC powders.  Salicylate level was 39.6 (2.8-30).  She was contacted after clinic for evaluation and management in the emergency room.  She was seen in the ER on 07/09/2016.  Salicylate level was 23.6.  She was hydrated for 3 hours.  Repeat salicylate level was 16.3.  She declined head CT.    Symptomatically, she notes decreased tinnitus.  She still has a headache.  She is chewing ice.  She denies any bleeding.   Past Medical History:  Diagnosis Date  . Anemia   . Anxiety   . Asthma   . Diabetes mellitus type II   . GERD (gastroesophageal reflux disease)   . Hypertension   . Obesity   . Osteopenia   . Sleep apnea   . 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 Vitamin B12 deficiency    since bariatric surgery    Past Surgical History:  Procedure Laterality Date  . GASTRIC BYPASS  03/17/02    Family History  Problem Relation Age of Onset  . Heart disease Mother     valve replacement surgery  . Coronary artery disease Father     Social History:  reports that she has quit smoking. She has never used smokeless tobacco. She reports that she does not drink alcohol or use drugs.  She quit smoking > 20 years ago, but started smoking again 3-4 years ago.  She smokes 1 1/2 pack a day.  The patient is accompanied by her husband today.  Allergies:  Allergies  Allergen Reactions  . Codeine Sulfate Other (See Comments)    Reaction: "violently ill"  .  Penicillins Other (See Comments)    Reaction: Unsure   Has patient had a PCN reaction causing immediate rash, facial/tongue/throat swelling, SOB or lightheadedness with hypotension: No Has patient had a PCN reaction causing severe rash involving mucus membranes or skin necrosis: No Has patient had a PCN reaction that required hospitalization No Has patient had a PCN reaction occurring within the last 10 years: No If all of the above answers are "NO", then may proceed with Cephalosporin use.     Current Medications: No current outpatient prescriptions on file.   No current facility-administered medications for this visit.    Facility-Administered Medications Ordered in Other Visits  Medication Dose Route Frequency Provider Last Rate Last Dose  . sodium chloride 0.9 % injection 10 mL  10 mL Intracatheter PRN Jeralyn Ruths, MD        Review of Systems:  GENERAL: Feels good.  No fevers or sweats. Weight down 3 pounds. PERFORMANCE STATUS (ECOG):  0 HEENT:  Tinnitus, improved.  No visual changes.  No sore throat, runny nose, sore throat, mouth sores or tenderness. Lungs: No shortness of breath.  No hemoptysis.  h/o sleep apnea, not using CPAP.  No snoring per husband. Cardiac:  No chest pain, palpitations, orthopnea, or PND. GI:  Ice pica.  No diarrhea,  constipation, melena or hematochezia. GU:  No urgency, frequency, dysuria, or hematuria. Musculoskeletal:  No back pain.  No joint pain.  No muscle tenderness. Extremities:  Intermittent pedal edema.  No change in color. Skin:  No rashes or skin changes. Neuro:  Restless leg, improved.  No numbness or weakness, balance or coordination issues. Endocrine:  No diabetes, thyroid issues, hot flashes or night sweats. Psych:  No mood changes, depression or anxiety.  Sleeping better. Pain:  No focal pain. Review of systems:  All other systems reviewed and found to be negative.   Physical Exam: Blood pressure (!) 144/86, pulse 97,  temperature 97.6 F (36.4 C), temperature source Tympanic, resp. rate 18, weight 120 lb 2.4 oz (54.5 kg). GENERAL:  Well developed, well nourished, woman sitting comfortably in the exam room in no acute distress. MENTAL STATUS:  Alert and oriented to person, place and time. HEAD:  Short curly blonde hair.  Normocephalic, atraumatic, face symmetric, no Cushingoid features. EYES:  Green eyes.  Pupils equal round and reactive to light and accomodation.  No conjunctivitis or scleral icterus. ENT:  Oropharynx clear without lesion.  Tongue normal. Mucous membranes moist.  RESPIRATORY:  Clear to auscultation without rales, wheezes or rhonchi. CARDIOVASCULAR:  Regular rate and rhythm without murmur, rub or gallop. ABDOMEN:  Soft, non-tender, with active bowel sounds, and no hepatosplenomegaly.  No masses. SKIN:  No rashes, ulcers or lesions. EXTREMITIES: No edema, no skin discoloration or tenderness.  No palpable cords. LYMPH NODES: No palpable cervical, supraclavicular, axillary or inguinal adenopathy  NEUROLOGICAL: Unremarkable. PSYCH:  Appropriate.    Appointment on 08/06/2016  Component Date Value Ref Range Status  . WBC 08/06/2016 2.4* 3.6 - 11.0 K/uL Final  . RBC 08/06/2016 4.37  3.80 - 5.20 MIL/uL Final  . Hemoglobin 08/06/2016 15.8  12.0 - 16.0 g/dL Final  . HCT 95/28/413211/28/2017 45.8  35.0 - 47.0 % Final  . MCV 08/06/2016 104.8* 80.0 - 100.0 fL Final  . MCH 08/06/2016 36.2* 26.0 - 34.0 pg Final  . MCHC 08/06/2016 34.6  32.0 - 36.0 g/dL Final  . RDW 44/01/027211/28/2017 14.9* 11.5 - 14.5 % Final  . Platelets 08/06/2016 161  150 - 440 K/uL Final  . Neutrophils Relative % 08/06/2016 65  % Final  . Neutro Abs 08/06/2016 1.6  1.4 - 6.5 K/uL Final  . Lymphocytes Relative 08/06/2016 20  % Final  . Lymphs Abs 08/06/2016 0.5* 1.0 - 3.6 K/uL Final  . Monocytes Relative 08/06/2016 13  % Final  . Monocytes Absolute 08/06/2016 0.3  0.2 - 0.9 K/uL Final  . Eosinophils Relative 08/06/2016 1  % Final  .  Eosinophils Absolute 08/06/2016 0.0  0 - 0.7 K/uL Final  . Basophils Relative 08/06/2016 1  % Final  . Basophils Absolute 08/06/2016 0.0  0 - 0.1 K/uL Final    Assessment:  Brenda Hawkins is a 50 y.o. female status post gastric bypass surgery in 03/2002 and subsequent development of iron deficiency anemia and B12 deficiency.  She is intolerant of oral iron.  She denies any melena or hematochezia.  Stool guaiacs were negative on 01/02/2015.  She received her 4 infusions of Venofer (05/09 - 02/07/2015). She receives B12 (began 01/16/2015; last 07/08/2016). Hematocrit initially improved from 30.0 to 42.8 to 46.6.  She received Venofer 200 mg IV on 10/10/2015.  Restless legs have improve.  Ice pica is slightly better.  Diet is poor.  She denies any melena or hematochezia.  She has not had a colonoscopy.  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  She denies any liver disease.  Thyroid studies have been normal in the past.  Labs on 07/08/2016 revealed the following normal labs:  folate (11.4), TSH (1.563), and reticulocyte count (1.7%).  She has a history of sleep apnea.  She previously used CPAP when she was heavier.  She is not using CPAP now.   She smokes 1 1/2 packs per day.  She has an increasing hematocrit despite low iron stores suggestive of secondary erythrocytosis due to smoking, sleep apnea, or a myeloproliferative disorder.  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  She was taking over 50 BC powders a week for chronic headaches.  She had tinnitus.  Salicylate level was 39.6 (2.8-30).  She was hydrated in the ER on 07/09/2016.  Follow-up salicylate level was 16.3.  Symptomatically, her tinnitus has improved.  Weight is down 3 pounds.  Exam is stable.  Hematocrit is 45.8.  Ferritin is 13 (low).  Plan: 1.  Labs today:  CBC with diff, ferritin. 2.  B12 today. 3.  Discuss colonoscopy and mammogram.  Remind after her next birthday (11/06/2017) per  patient request. 4.  RTC monthly x 2 for labs (CBC with diff) + B12 5.  RTC in 3 months for MD assessment and labs (CBC with diff) + B12.   Rosey BathMelissa C Melvin Marmo, MD  08/06/2016, 11:50 AM

## 2016-08-28 DIAGNOSIS — E538 Deficiency of other specified B group vitamins: Secondary | ICD-10-CM | POA: Insufficient documentation

## 2016-09-03 ENCOUNTER — Inpatient Hospital Stay: Payer: BLUE CROSS/BLUE SHIELD

## 2016-09-03 ENCOUNTER — Other Ambulatory Visit: Payer: Self-pay

## 2016-09-03 ENCOUNTER — Inpatient Hospital Stay: Payer: BLUE CROSS/BLUE SHIELD | Attending: Hematology and Oncology

## 2016-09-03 DIAGNOSIS — E538 Deficiency of other specified B group vitamins: Secondary | ICD-10-CM | POA: Diagnosis not present

## 2016-09-03 DIAGNOSIS — D508 Other iron deficiency anemias: Secondary | ICD-10-CM | POA: Diagnosis present

## 2016-09-03 DIAGNOSIS — Z9884 Bariatric surgery status: Secondary | ICD-10-CM | POA: Insufficient documentation

## 2016-09-03 LAB — CBC WITH DIFFERENTIAL/PLATELET
Basophils Absolute: 0 10*3/uL (ref 0–0.1)
Basophils Relative: 1 %
Eosinophils Absolute: 0 10*3/uL (ref 0–0.7)
Eosinophils Relative: 1 %
HCT: 44.8 % (ref 35.0–47.0)
Hemoglobin: 15.5 g/dL (ref 12.0–16.0)
Lymphocytes Relative: 18 %
Lymphs Abs: 0.9 10*3/uL — ABNORMAL LOW (ref 1.0–3.6)
MCH: 36 pg — ABNORMAL HIGH (ref 26.0–34.0)
MCHC: 34.5 g/dL (ref 32.0–36.0)
MCV: 104.4 fL — ABNORMAL HIGH (ref 80.0–100.0)
Monocytes Absolute: 0.4 10*3/uL (ref 0.2–0.9)
Monocytes Relative: 7 %
Neutro Abs: 3.9 10*3/uL (ref 1.4–6.5)
Neutrophils Relative %: 73 %
Platelets: 217 10*3/uL (ref 150–440)
RBC: 4.29 MIL/uL (ref 3.80–5.20)
RDW: 13.7 % (ref 11.5–14.5)
WBC: 5.3 10*3/uL (ref 3.6–11.0)

## 2016-09-03 MED ORDER — CYANOCOBALAMIN 1000 MCG/ML IJ SOLN
1000.0000 ug | Freq: Once | INTRAMUSCULAR | Status: AC
  Administered 2016-09-03: 1000 ug via INTRAMUSCULAR
  Filled 2016-09-03: qty 1

## 2016-10-01 ENCOUNTER — Inpatient Hospital Stay: Payer: 59 | Attending: Hematology and Oncology

## 2016-10-01 ENCOUNTER — Inpatient Hospital Stay: Payer: 59

## 2016-10-01 DIAGNOSIS — E538 Deficiency of other specified B group vitamins: Secondary | ICD-10-CM | POA: Insufficient documentation

## 2016-10-01 DIAGNOSIS — Z9884 Bariatric surgery status: Secondary | ICD-10-CM | POA: Diagnosis not present

## 2016-10-01 DIAGNOSIS — D508 Other iron deficiency anemias: Secondary | ICD-10-CM

## 2016-10-01 LAB — CBC WITH DIFFERENTIAL/PLATELET
Basophils Absolute: 0 10*3/uL (ref 0–0.1)
Basophils Relative: 1 %
Eosinophils Absolute: 0.1 10*3/uL (ref 0–0.7)
Eosinophils Relative: 1 %
HCT: 43.2 % (ref 35.0–47.0)
Hemoglobin: 14.7 g/dL (ref 12.0–16.0)
Lymphocytes Relative: 22 %
Lymphs Abs: 1.2 10*3/uL (ref 1.0–3.6)
MCH: 35.2 pg — ABNORMAL HIGH (ref 26.0–34.0)
MCHC: 34 g/dL (ref 32.0–36.0)
MCV: 103.6 fL — ABNORMAL HIGH (ref 80.0–100.0)
Monocytes Absolute: 0.4 10*3/uL (ref 0.2–0.9)
Monocytes Relative: 7 %
Neutro Abs: 3.6 10*3/uL (ref 1.4–6.5)
Neutrophils Relative %: 69 %
Platelets: 240 10*3/uL (ref 150–440)
RBC: 4.17 MIL/uL (ref 3.80–5.20)
RDW: 13.6 % (ref 11.5–14.5)
WBC: 5.3 10*3/uL (ref 3.6–11.0)

## 2016-10-01 MED ORDER — CYANOCOBALAMIN 1000 MCG/ML IJ SOLN
1000.0000 ug | Freq: Once | INTRAMUSCULAR | Status: AC
  Administered 2016-10-01: 1000 ug via INTRAMUSCULAR
  Filled 2016-10-01: qty 1

## 2016-11-04 NOTE — Progress Notes (Signed)
Brenda Hawkins-  Cancer [facility]  Clinic day:  11/09/16  Chief Complaint: Brenda Hawkins is a 51 y.o. female status post gastric bypass surgery with iron deficiency anemia and B12 deficiency.  HPI: Patient returns to clinic today for further evaluation and continuation of B-12 injections. She currently feels well and is asymptomatic. She has no neurologic complaints. She does not complain of further tinnitus today. She denies any recent fevers or illnesses. She has a good appetite and denies weight loss. She has no chest pain or shortness of breath. She denies any nausea, vomiting, constipation, or diarrhea. She has no melena or hematochezia. She has no urinary complaints. Patient feels at her baseline and offers no specific complaints today.   Past Medical History:  Diagnosis Date  . Anemia   . Anxiety   . Asthma   . Diabetes mellitus type II   . GERD (gastroesophageal reflux disease)   . Hypertension   . Obesity   . Osteopenia   . Sleep apnea   . 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 Vitamin B12 deficiency    since bariatric surgery    Past Surgical History:  Procedure Laterality Date  . GASTRIC BYPASS  03/17/02    Family History  Problem Relation Age of Onset  . Heart disease Mother     valve replacement surgery  . Coronary artery disease Father     Social History:  reports that she has quit smoking. She has never used smokeless tobacco. She reports that she does not drink alcohol or use drugs.  She quit smoking > 20 years ago, but started smoking again 3-4 years ago.  She smokes 1 1/2 pack a day.  The patient is accompanied by her husband today.  Allergies:  Allergies  Allergen Reactions  . Codeine Sulfate Other (See Comments)    Reaction: "violently ill"  . Penicillins Other (See Comments)    Reaction: Unsure   Has patient had a PCN reaction causing immediate rash, facial/tongue/throat swelling, SOB or lightheadedness with hypotension: No Has patient had a PCN  reaction causing severe rash involving mucus membranes or skin necrosis: No Has patient had a PCN reaction that required hospitalization No Has patient had a PCN reaction occurring within the last 10 years: No If all of the above answers are "NO", then may proceed with Cephalosporin use.     Current Medications: Current Outpatient Prescriptions  Medication Sig Dispense Refill  . loratadine (CLARITIN) 10 MG tablet Take 10 mg by mouth daily.     No current facility-administered medications for this visit.    Facility-Administered Medications Ordered in Other Visits  Medication Dose Route Frequency Provider Last Rate Last Dose  . sodium chloride 0.9 % injection 10 mL  10 mL Intracatheter PRN Jeralyn Ruths, MD        Review of Systems:  GENERAL: Feels good.  No fevers or sweats. PERFORMANCE STATUS (ECOG):  0 HEENT:  Tinnitus, improved.  No visual changes.  No sore throat, runny nose, sore throat, mouth sores or tenderness. Lungs: No shortness of breath.  No hemoptysis.  h/o sleep apnea, not using CPAP.  No snoring per husband. Cardiac:  No chest pain, palpitations, orthopnea, or PND. GI:  Ice pica.  No diarrhea, constipation, melena or hematochezia. GU:  No urgency, frequency, dysuria, or hematuria. Musculoskeletal:  No back pain.  No joint pain.  No muscle tenderness. Extremities:  Intermittent pedal edema.  No change in color. Skin:  No rashes or skin changes.  Neuro:  Restless leg, improved.  No numbness or weakness, balance or coordination issues. Endocrine:  No diabetes, thyroid issues, hot flashes or night sweats. Psych:  No mood changes, depression or anxiety.  Sleeping better. Pain:  No focal pain.  Review of systems:  All other systems reviewed and found to be negative.   Physical Exam: Blood pressure 138/86, pulse (!) 103, temperature 98.5 F (36.9 C), temperature source Tympanic, resp. rate 18, weight 119 lb 0.8 oz (54 kg). GENERAL:  Well developed, well nourished,  woman sitting comfortably in the exam room in no acute distress. MENTAL STATUS:  Alert and oriented to person, place and time. HEAD:  Short curly blonde hair.  Normocephalic, atraumatic, face symmetric, no Cushingoid features. EYES:  Green eyes.  Pupils equal round and reactive to light and accomodation.  No conjunctivitis or scleral icterus. ENT:  Oropharynx clear without lesion.  Tongue normal. Mucous membranes moist.  RESPIRATORY:  Clear to auscultation without rales, wheezes or rhonchi. CARDIOVASCULAR:  Regular rate and rhythm without murmur, rub or gallop. ABDOMEN:  Soft, non-tender, with active bowel sounds, and no hepatosplenomegaly.  No masses. SKIN:  No rashes, ulcers or lesions. EXTREMITIES: No edema, no skin discoloration or tenderness.  No palpable cords. LYMPH NODES: No palpable cervical, supraclavicular, axillary or inguinal adenopathy  NEUROLOGICAL: Unremarkable. PSYCH:  Appropriate.    Appointment on 11/05/2016  Component Date Value Ref Range Status  . WBC 11/05/2016 3.7  3.6 - 11.0 K/uL Final  . RBC 11/05/2016 4.26  3.80 - 5.20 MIL/uL Final  . Hemoglobin 11/05/2016 15.0  12.0 - 16.0 g/dL Final  . HCT 16/10/960402/27/2018 43.2  35.0 - 47.0 % Final  . MCV 11/05/2016 101.6* 80.0 - 100.0 fL Final  . MCH 11/05/2016 35.1* 26.0 - 34.0 pg Final  . MCHC 11/05/2016 34.6  32.0 - 36.0 g/dL Final  . RDW 54/09/811902/27/2018 14.5  11.5 - 14.5 % Final  . Platelets 11/05/2016 234  150 - 440 K/uL Final  . Neutrophils Relative % 11/05/2016 75  % Final  . Neutro Abs 11/05/2016 2.8  1.4 - 6.5 K/uL Final  . Lymphocytes Relative 11/05/2016 12  % Final  . Lymphs Abs 11/05/2016 0.4* 1.0 - 3.6 K/uL Final  . Monocytes Relative 11/05/2016 11  % Final  . Monocytes Absolute 11/05/2016 0.4  0.2 - 0.9 K/uL Final  . Eosinophils Relative 11/05/2016 1  % Final  . Eosinophils Absolute 11/05/2016 0.0  0 - 0.7 K/uL Final  . Basophils Relative 11/05/2016 1  % Final  . Basophils Absolute 11/05/2016 0.0  0 - 0.1 K/uL Final     Assessment:  Brenda Hawkins is a 51 y.o. female status post gastric bypass surgery in 03/2002 and subsequent development of iron deficiency anemia and B12 deficiency.  She is intolerant of oral iron.  She denies any melena or hematochezia.  Stool guaiacs were negative on 01/02/2015.  She received her 4 infusions of Venofer (05/09 - 02/07/2015). She receives B12 (began 01/16/2015; last 07/08/2016). Hematocrit initially improved from 30.0 to 42.8 to 46.6.  She received Venofer 200 mg IV on 10/10/2015.  Restless legs have improve.  Ice pica is slightly better.  Diet is poor.  She denies any melena or hematochezia.  She has not had a colonoscopy.  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  She denies any liver disease.  Thyroid studies have been normal in the past.  Labs on 07/08/2016 revealed the following normal labs:  folate (11.4), TSH (1.563),  and reticulocyte count (1.7%).  She has a history of sleep apnea.  She previously used CPAP when she was heavier.  She is not using CPAP now.   She smokes 1 1/2 packs per day.  She has an increasing hematocrit despite low iron stores suggestive of secondary erythrocytosis due to smoking, sleep apnea, or a myeloproliferative disorder.  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  She was taking over 50 BC powders a week for chronic headaches.  She had tinnitus.  Salicylate level was 39.6 (2.8-30).  She was hydrated in the ER on 07/09/2016.  Follow-up salicylate level was 16.3.  Plan: 1.  Labs today:  CBC with diff, ferritin. 2.  Patient's hemoglobin is normal at 15.0, but her MCV remains elevated at 101.6 although trending down. Proceed with B12 today. 3.  Discuss colonoscopy and mammogram.  Remind after her next birthday (11/06/2017) per patient request. 4.  RTC monthly x 2 for labs (CBC with diff) + B12 5.  RTC in 3 months for MD assessment and labs (CBC with diff) + B12.   Jeralyn Ruths, MD  11/09/2016,  7:39 AM

## 2016-11-05 ENCOUNTER — Inpatient Hospital Stay (HOSPITAL_BASED_OUTPATIENT_CLINIC_OR_DEPARTMENT_OTHER): Payer: 59 | Admitting: Oncology

## 2016-11-05 ENCOUNTER — Inpatient Hospital Stay: Payer: 59 | Attending: Oncology

## 2016-11-05 ENCOUNTER — Inpatient Hospital Stay: Payer: 59

## 2016-11-05 ENCOUNTER — Other Ambulatory Visit: Payer: Self-pay | Admitting: Hematology and Oncology

## 2016-11-05 VITALS — BP 138/86 | HR 103 | Temp 98.5°F | Resp 18 | Wt 119.0 lb

## 2016-11-05 DIAGNOSIS — F17210 Nicotine dependence, cigarettes, uncomplicated: Secondary | ICD-10-CM | POA: Insufficient documentation

## 2016-11-05 DIAGNOSIS — D508 Other iron deficiency anemias: Secondary | ICD-10-CM | POA: Diagnosis present

## 2016-11-05 DIAGNOSIS — M8580 Other specified disorders of bone density and structure, unspecified site: Secondary | ICD-10-CM | POA: Insufficient documentation

## 2016-11-05 DIAGNOSIS — I10 Essential (primary) hypertension: Secondary | ICD-10-CM | POA: Insufficient documentation

## 2016-11-05 DIAGNOSIS — Z79899 Other long term (current) drug therapy: Secondary | ICD-10-CM

## 2016-11-05 DIAGNOSIS — Z880 Allergy status to penicillin: Secondary | ICD-10-CM | POA: Diagnosis not present

## 2016-11-05 DIAGNOSIS — F5089 Other specified eating disorder: Secondary | ICD-10-CM | POA: Diagnosis not present

## 2016-11-05 DIAGNOSIS — G4730 Sleep apnea, unspecified: Secondary | ICD-10-CM | POA: Insufficient documentation

## 2016-11-05 DIAGNOSIS — Z9884 Bariatric surgery status: Secondary | ICD-10-CM | POA: Insufficient documentation

## 2016-11-05 DIAGNOSIS — E538 Deficiency of other specified B group vitamins: Secondary | ICD-10-CM | POA: Insufficient documentation

## 2016-11-05 DIAGNOSIS — G2581 Restless legs syndrome: Secondary | ICD-10-CM | POA: Diagnosis not present

## 2016-11-05 DIAGNOSIS — K219 Gastro-esophageal reflux disease without esophagitis: Secondary | ICD-10-CM | POA: Diagnosis not present

## 2016-11-05 DIAGNOSIS — E119 Type 2 diabetes mellitus without complications: Secondary | ICD-10-CM | POA: Insufficient documentation

## 2016-11-05 LAB — CBC WITH DIFFERENTIAL/PLATELET
Basophils Absolute: 0 10*3/uL (ref 0–0.1)
Basophils Relative: 1 %
Eosinophils Absolute: 0 10*3/uL (ref 0–0.7)
Eosinophils Relative: 1 %
HCT: 43.2 % (ref 35.0–47.0)
Hemoglobin: 15 g/dL (ref 12.0–16.0)
Lymphocytes Relative: 12 %
Lymphs Abs: 0.4 10*3/uL — ABNORMAL LOW (ref 1.0–3.6)
MCH: 35.1 pg — ABNORMAL HIGH (ref 26.0–34.0)
MCHC: 34.6 g/dL (ref 32.0–36.0)
MCV: 101.6 fL — ABNORMAL HIGH (ref 80.0–100.0)
Monocytes Absolute: 0.4 10*3/uL (ref 0.2–0.9)
Monocytes Relative: 11 %
Neutro Abs: 2.8 10*3/uL (ref 1.4–6.5)
Neutrophils Relative %: 75 %
Platelets: 234 10*3/uL (ref 150–440)
RBC: 4.26 MIL/uL (ref 3.80–5.20)
RDW: 14.5 % (ref 11.5–14.5)
WBC: 3.7 10*3/uL (ref 3.6–11.0)

## 2016-11-05 MED ORDER — CYANOCOBALAMIN 1000 MCG/ML IJ SOLN
1000.0000 ug | Freq: Once | INTRAMUSCULAR | Status: AC
  Administered 2016-11-05: 1000 ug via INTRAMUSCULAR
  Filled 2016-11-05: qty 1

## 2016-11-05 NOTE — Progress Notes (Signed)
Patient states she has some tightness in her chest.  She has started claritin for allergies. Otherwise, no complaints today.

## 2016-12-03 ENCOUNTER — Inpatient Hospital Stay: Payer: 59

## 2016-12-03 ENCOUNTER — Inpatient Hospital Stay: Payer: 59 | Attending: Hematology and Oncology

## 2016-12-03 DIAGNOSIS — D508 Other iron deficiency anemias: Secondary | ICD-10-CM | POA: Insufficient documentation

## 2016-12-03 DIAGNOSIS — E538 Deficiency of other specified B group vitamins: Secondary | ICD-10-CM | POA: Diagnosis not present

## 2016-12-03 DIAGNOSIS — Z9884 Bariatric surgery status: Secondary | ICD-10-CM | POA: Insufficient documentation

## 2016-12-03 DIAGNOSIS — F5089 Other specified eating disorder: Secondary | ICD-10-CM | POA: Diagnosis not present

## 2016-12-03 LAB — CBC WITH DIFFERENTIAL/PLATELET
Basophils Absolute: 0 10*3/uL (ref 0–0.1)
Basophils Relative: 1 %
Eosinophils Absolute: 0.1 10*3/uL (ref 0–0.7)
Eosinophils Relative: 2 %
HCT: 43.8 % (ref 35.0–47.0)
Hemoglobin: 15 g/dL (ref 12.0–16.0)
Lymphocytes Relative: 23 %
Lymphs Abs: 1 10*3/uL (ref 1.0–3.6)
MCH: 34.6 pg — ABNORMAL HIGH (ref 26.0–34.0)
MCHC: 34.3 g/dL (ref 32.0–36.0)
MCV: 100.8 fL — ABNORMAL HIGH (ref 80.0–100.0)
Monocytes Absolute: 0.4 10*3/uL (ref 0.2–0.9)
Monocytes Relative: 9 %
Neutro Abs: 2.9 10*3/uL (ref 1.4–6.5)
Neutrophils Relative %: 65 %
Platelets: 251 10*3/uL (ref 150–440)
RBC: 4.35 MIL/uL (ref 3.80–5.20)
RDW: 15.2 % — ABNORMAL HIGH (ref 11.5–14.5)
WBC: 4.4 10*3/uL (ref 3.6–11.0)

## 2016-12-03 MED ORDER — CYANOCOBALAMIN 1000 MCG/ML IJ SOLN
1000.0000 ug | Freq: Once | INTRAMUSCULAR | Status: AC
  Administered 2016-12-03: 1000 ug via INTRAMUSCULAR
  Filled 2016-12-03: qty 1

## 2016-12-31 ENCOUNTER — Inpatient Hospital Stay: Payer: 59 | Attending: Hematology and Oncology

## 2016-12-31 ENCOUNTER — Inpatient Hospital Stay: Payer: 59

## 2016-12-31 DIAGNOSIS — D508 Other iron deficiency anemias: Secondary | ICD-10-CM | POA: Insufficient documentation

## 2016-12-31 DIAGNOSIS — Z9884 Bariatric surgery status: Secondary | ICD-10-CM | POA: Diagnosis not present

## 2016-12-31 DIAGNOSIS — D509 Iron deficiency anemia, unspecified: Secondary | ICD-10-CM

## 2016-12-31 DIAGNOSIS — E538 Deficiency of other specified B group vitamins: Secondary | ICD-10-CM | POA: Diagnosis not present

## 2016-12-31 LAB — CBC WITH DIFFERENTIAL/PLATELET
Basophils Absolute: 0 10*3/uL (ref 0–0.1)
Basophils Relative: 1 %
Eosinophils Absolute: 0.1 10*3/uL (ref 0–0.7)
Eosinophils Relative: 2 %
HCT: 41.7 % (ref 35.0–47.0)
Hemoglobin: 14.3 g/dL (ref 12.0–16.0)
Lymphocytes Relative: 23 %
Lymphs Abs: 1 10*3/uL (ref 1.0–3.6)
MCH: 34.7 pg — ABNORMAL HIGH (ref 26.0–34.0)
MCHC: 34.3 g/dL (ref 32.0–36.0)
MCV: 101.2 fL — ABNORMAL HIGH (ref 80.0–100.0)
Monocytes Absolute: 0.3 10*3/uL (ref 0.2–0.9)
Monocytes Relative: 7 %
Neutro Abs: 2.8 10*3/uL (ref 1.4–6.5)
Neutrophils Relative %: 67 %
Platelets: 249 10*3/uL (ref 150–440)
RBC: 4.12 MIL/uL (ref 3.80–5.20)
RDW: 15.5 % — ABNORMAL HIGH (ref 11.5–14.5)
WBC: 4.2 10*3/uL (ref 3.6–11.0)

## 2016-12-31 LAB — VITAMIN B12: Vitamin B-12: 250 pg/mL (ref 180–914)

## 2016-12-31 MED ORDER — CYANOCOBALAMIN 1000 MCG/ML IJ SOLN
1000.0000 ug | Freq: Once | INTRAMUSCULAR | Status: AC
  Administered 2016-12-31: 1000 ug via INTRAMUSCULAR
  Filled 2016-12-31: qty 1

## 2017-01-31 ENCOUNTER — Other Ambulatory Visit: Payer: Self-pay | Admitting: *Deleted

## 2017-01-31 DIAGNOSIS — D508 Other iron deficiency anemias: Secondary | ICD-10-CM

## 2017-02-04 ENCOUNTER — Inpatient Hospital Stay (HOSPITAL_BASED_OUTPATIENT_CLINIC_OR_DEPARTMENT_OTHER): Payer: 59 | Admitting: Hematology and Oncology

## 2017-02-04 ENCOUNTER — Inpatient Hospital Stay: Payer: 59 | Attending: Hematology and Oncology

## 2017-02-04 ENCOUNTER — Inpatient Hospital Stay: Payer: 59

## 2017-02-04 ENCOUNTER — Other Ambulatory Visit: Payer: Self-pay | Admitting: *Deleted

## 2017-02-04 ENCOUNTER — Encounter: Payer: Self-pay | Admitting: Hematology and Oncology

## 2017-02-04 VITALS — BP 130/77 | HR 67 | Temp 99.3°F | Resp 18 | Wt 122.2 lb

## 2017-02-04 DIAGNOSIS — G2581 Restless legs syndrome: Secondary | ICD-10-CM | POA: Insufficient documentation

## 2017-02-04 DIAGNOSIS — F17210 Nicotine dependence, cigarettes, uncomplicated: Secondary | ICD-10-CM | POA: Diagnosis not present

## 2017-02-04 DIAGNOSIS — Z9884 Bariatric surgery status: Secondary | ICD-10-CM | POA: Insufficient documentation

## 2017-02-04 DIAGNOSIS — G4730 Sleep apnea, unspecified: Secondary | ICD-10-CM | POA: Insufficient documentation

## 2017-02-04 DIAGNOSIS — Z880 Allergy status to penicillin: Secondary | ICD-10-CM | POA: Diagnosis not present

## 2017-02-04 DIAGNOSIS — H9319 Tinnitus, unspecified ear: Secondary | ICD-10-CM

## 2017-02-04 DIAGNOSIS — E119 Type 2 diabetes mellitus without complications: Secondary | ICD-10-CM

## 2017-02-04 DIAGNOSIS — K219 Gastro-esophageal reflux disease without esophagitis: Secondary | ICD-10-CM | POA: Insufficient documentation

## 2017-02-04 DIAGNOSIS — F5089 Other specified eating disorder: Secondary | ICD-10-CM | POA: Diagnosis not present

## 2017-02-04 DIAGNOSIS — I10 Essential (primary) hypertension: Secondary | ICD-10-CM

## 2017-02-04 DIAGNOSIS — D509 Iron deficiency anemia, unspecified: Secondary | ICD-10-CM | POA: Diagnosis not present

## 2017-02-04 DIAGNOSIS — M8580 Other specified disorders of bone density and structure, unspecified site: Secondary | ICD-10-CM

## 2017-02-04 DIAGNOSIS — D508 Other iron deficiency anemias: Secondary | ICD-10-CM

## 2017-02-04 DIAGNOSIS — Z79899 Other long term (current) drug therapy: Secondary | ICD-10-CM

## 2017-02-04 DIAGNOSIS — E538 Deficiency of other specified B group vitamins: Secondary | ICD-10-CM | POA: Diagnosis not present

## 2017-02-04 LAB — CBC WITH DIFFERENTIAL/PLATELET
Basophils Absolute: 0.1 10*3/uL (ref 0–0.1)
Basophils Relative: 1 %
Eosinophils Absolute: 0.1 10*3/uL (ref 0–0.7)
Eosinophils Relative: 2 %
HCT: 39.4 % (ref 35.0–47.0)
Hemoglobin: 13.6 g/dL (ref 12.0–16.0)
Lymphocytes Relative: 23 %
Lymphs Abs: 1.1 10*3/uL (ref 1.0–3.6)
MCH: 34.9 pg — ABNORMAL HIGH (ref 26.0–34.0)
MCHC: 34.5 g/dL (ref 32.0–36.0)
MCV: 101 fL — ABNORMAL HIGH (ref 80.0–100.0)
Monocytes Absolute: 0.3 10*3/uL (ref 0.2–0.9)
Monocytes Relative: 7 %
Neutro Abs: 3 10*3/uL (ref 1.4–6.5)
Neutrophils Relative %: 67 %
Platelets: 264 10*3/uL (ref 150–440)
RBC: 3.9 MIL/uL (ref 3.80–5.20)
RDW: 15.7 % — ABNORMAL HIGH (ref 11.5–14.5)
WBC: 4.6 10*3/uL (ref 3.6–11.0)

## 2017-02-04 MED ORDER — CYANOCOBALAMIN 1000 MCG/ML IJ SOLN
1000.0000 ug | Freq: Once | INTRAMUSCULAR | Status: AC
  Administered 2017-02-04: 1000 ug via INTRAMUSCULAR
  Filled 2017-02-04: qty 1

## 2017-02-04 NOTE — Progress Notes (Signed)
[facility]-  Cancer Center  Clinic day:  02/04/17  Chief Complaint: Brenda Hawkins is a 51 y.o. female status post gastric bypass surgery with iron deficiency anemia and B12 deficiency who is seen for 6 month assessment.  HPI: The patient was last seen in medical oncology clinic on 07/08/2016.  At that time, she described chronic headaches x 2 years.  She was taking  excessive BC powders.  She had tinnitus.  Weight was down 3 pounds.  Hematocrit was 46.6  Ferritin was 14 (low).  She received B12.  We discussed discontinuation of BC powders.  Salicylate level was 39.6 (2.8-30).  She was contacted after clinic for evaluation and management in the emergency room.  She was seen in the ER on 07/09/2016.  Salicylate level was 23.6.  She was hydrated for 3 hours.  Repeat salicylate level was 16.3.  She declined head CT.    Symptomatically, she notes a little tinnitus.  She describes being "fine for a while". She is now again eating ice.  She denies any bleeding.  B12 was 250 on 12/31/2016.   Past Medical History:  Diagnosis Date  . Anemia   . Anxiety   . Asthma   . Diabetes mellitus type II   . GERD (gastroesophageal reflux disease)   . Hypertension   . Obesity   . Osteopenia   . Sleep apnea   . 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 Vitamin B12 deficiency    since bariatric surgery    Past Surgical History:  Procedure Laterality Date  . GASTRIC BYPASS  03/17/02    Family History  Problem Relation Age of Onset  . Heart disease Mother        valve replacement surgery  . Coronary artery disease Father     Social History:  reports that she has quit smoking. She has never used smokeless tobacco. She reports that she does not drink alcohol or use drugs.  She quit smoking > 20 years ago, but started smoking again 3-4 years ago.  She smokes 1 1/2 pack a day.  [place]  Allergies:  Allergies  Allergen Reactions  . Codeine Sulfate Other (See Comments)   Reaction: "violently ill"  . Penicillins Other (See Comments)    Reaction: Unsure   Has patient had a PCN reaction causing immediate rash, facial/tongue/throat swelling, SOB or lightheadedness with hypotension: No Has patient had a PCN reaction causing severe rash involving mucus membranes or skin necrosis: No Has patient had a PCN reaction that required hospitalization No Has patient had a PCN reaction occurring within the last 10 years: No If all of the above answers are "NO", then may proceed with Cephalosporin use.     Current Medications: Current Outpatient Prescriptions  Medication Sig Dispense Refill  . loratadine (CLARITIN) 10 MG tablet Take 10 mg by mouth daily.     No current facility-administered medications for this visit.    Facility-Administered Medications Ordered in Other Visits  Medication Dose Route Frequency Provider Last Rate Last Dose  . sodium chloride 0.9 % injection 10 mL  10 mL Intracatheter PRN Jeralyn Ruths, MD        Review of Systems:  GENERAL: Feels "ok".  No fevers or sweats. Weight up 2 pounds. PERFORMANCE STATUS (ECOG):  0 HEENT:  Tinnitus, slight.  No visual changes.  No sore throat, runny nose, sore throat, mouth sores or tenderness. Lungs: No shortness of breath.  No hemoptysis.  h/o sleep  apnea, not using CPAP.  Cardiac:  No chest pain, palpitations, orthopnea, or PND. GI:  Ice pica.  No diarrhea, constipation, melena or hematochezia. GU:  No urgency, frequency, dysuria, or hematuria. Musculoskeletal:  No back pain.  No joint pain.  No muscle tenderness. Extremities:  Intermittent pedal edema.  No change in color. Skin:  No rashes or skin changes. Neuro:  Restless leg.  No numbness or weakness, balance or coordination issues. Endocrine:  No diabetes, thyroid issues, hot flashes or night sweats. Psych:  No mood changes, depression or anxiety.  Sleeping better. Pain:  No focal pain. Review of systems:  All other systems reviewed and  found to be negative.   Physical Exam: Blood pressure 130/77, pulse 67, temperature 99.3 F (37.4 C), temperature source Tympanic, resp. rate 18, weight 122 lb 3 oz (55.4 kg). GENERAL:  Well developed, well nourished, woman sitting comfortably in the exam room in no acute distress. MENTAL STATUS:  Alert and oriented to person, place and time. HEAD:  Short blonde hair.  Normocephalic, atraumatic, face symmetric, no Cushingoid features. EYES:  Green eyes.  Pupils equal round and reactive to light and accomodation.  No conjunctivitis or scleral icterus. ENT:  Oropharynx clear without lesion.  Tongue normal. Mucous membranes moist.  RESPIRATORY:  Clear to auscultation without rales, wheezes or rhonchi. CARDIOVASCULAR:  Regular rate and rhythm without murmur, rub or gallop. ABDOMEN:  Soft, non-tender, with active bowel sounds, and no hepatosplenomegaly.  No masses. SKIN:  No rashes, ulcers or lesions. EXTREMITIES: No edema, no skin discoloration or tenderness.  No palpable cords. LYMPH NODES: No palpable cervical, supraclavicular, axillary or inguinal adenopathy  NEUROLOGICAL: Unremarkable. PSYCH:  Appropriate.    Appointment on 02/04/2017  Component Date Value Ref Range Status  . WBC 02/04/2017 4.6  3.6 - 11.0 K/uL Final  . RBC 02/04/2017 3.90  3.80 - 5.20 MIL/uL Final  . Hemoglobin 02/04/2017 13.6  12.0 - 16.0 g/dL Final  . HCT 16/10/960405/29/2018 39.4  35.0 - 47.0 % Final  . MCV 02/04/2017 101.0* 80.0 - 100.0 fL Final  . MCH 02/04/2017 34.9* 26.0 - 34.0 pg Final  . MCHC 02/04/2017 34.5  32.0 - 36.0 g/dL Final  . RDW 54/09/811905/29/2018 15.7* 11.5 - 14.5 % Final  . Platelets 02/04/2017 264  150 - 440 K/uL Final  . Neutrophils Relative % 02/04/2017 67  % Final  . Neutro Abs 02/04/2017 3.0  1.4 - 6.5 K/uL Final  . Lymphocytes Relative 02/04/2017 23  % Final  . Lymphs Abs 02/04/2017 1.1  1.0 - 3.6 K/uL Final  . Monocytes Relative 02/04/2017 7  % Final  . Monocytes Absolute 02/04/2017 0.3  0.2 - 0.9 K/uL  Final  . Eosinophils Relative 02/04/2017 2  % Final  . Eosinophils Absolute 02/04/2017 0.1  0 - 0.7 K/uL Final  . Basophils Relative 02/04/2017 1  % Final  . Basophils Absolute 02/04/2017 0.1  0 - 0.1 K/uL Final    Assessment:  Brenda Hawkins is a 51 y.o. female status post gastric bypass surgery in 03/2002 and subsequent development of iron deficiency anemia and B12 deficiency.  She is intolerant of oral iron.  She denies any melena or hematochezia.  Stool guaiacs were negative on 01/02/2015.  Diet is poor.  She denies any melena or hematochezia.  She has not had a colonoscopy.  She received her 4 infusions of Venofer (05/09 - 02/07/2015). She receives B12 (began 01/16/2015; last 07/08/2016). Hematocrit initially improved from 30.0 to 42.8 to 46.6.  She received Venofer 200 mg IV on 10/10/2015.  Restless legs have improve.  Ice pica has improved.  Ferritin has been followed: 1.5 on 09/19/2014, 59 on 02/07/2015, 10 on 03/16/2015, 6 on 06/16/2015, 5 on 10/09/2015, 5 on 01/01/2016, 6 on 04/01/2016, 14 on 07/08/2016, and 13 on 08/06/2016.  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  She denies any liver disease.  Thyroid studies have been normal in the past.  Labs on 07/08/2016 revealed the following normal labs:  folate (11.4), TSH (1.563), and reticulocyte count (1.7%).  She has a history of sleep apnea.  She previously used CPAP when she was heavier.  She is not using CPAP now.   She smokes 1 1/2 packs per day.  She has an increasing hematocrit despite low iron stores suggestive of secondary erythrocytosis due to smoking, sleep apnea, or a myeloproliferative disorder.  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  She was taking over 50 BC powders a week for chronic headaches.  She had tinnitus.  Salicylate level was 39.6 (2.8-30).  She was hydrated in the ER on 07/09/2016.  Follow-up salicylate level was 16.3.  Symptomatically, her tinnitus has improved.   Weight is up 2 pounds.  Exam is stable.  Hematocrit is 39.4.   Plan: 1.  Labs today:  CBC with diff, ferritin. 2.  B12 today. 3.  Discuss colonoscopy and mammogram.  Patient has not scheduled.   Patient requests to be reminded at next visit. 4.  RTC monthly x 2 for B12. 5.  RTC in 3 months for MD assessment, labs (CBC with diff, feritin), and B12.   Rosey Bath, MD  02/04/2017, 3:07 PM

## 2017-03-06 ENCOUNTER — Inpatient Hospital Stay: Payer: 59 | Attending: Hematology and Oncology

## 2017-03-06 DIAGNOSIS — D580 Hereditary spherocytosis: Secondary | ICD-10-CM | POA: Diagnosis not present

## 2017-03-06 DIAGNOSIS — Z9884 Bariatric surgery status: Secondary | ICD-10-CM | POA: Diagnosis not present

## 2017-03-06 DIAGNOSIS — F5089 Other specified eating disorder: Secondary | ICD-10-CM | POA: Insufficient documentation

## 2017-03-06 DIAGNOSIS — D508 Other iron deficiency anemias: Secondary | ICD-10-CM | POA: Diagnosis present

## 2017-03-06 DIAGNOSIS — E538 Deficiency of other specified B group vitamins: Secondary | ICD-10-CM | POA: Diagnosis not present

## 2017-03-06 MED ORDER — CYANOCOBALAMIN 1000 MCG/ML IJ SOLN
1000.0000 ug | Freq: Once | INTRAMUSCULAR | Status: AC
  Administered 2017-03-06: 1000 ug via INTRAMUSCULAR
  Filled 2017-03-06: qty 1

## 2017-04-03 ENCOUNTER — Inpatient Hospital Stay: Payer: 59 | Attending: Hematology and Oncology

## 2017-04-03 DIAGNOSIS — D508 Other iron deficiency anemias: Secondary | ICD-10-CM | POA: Insufficient documentation

## 2017-04-03 DIAGNOSIS — F5089 Other specified eating disorder: Secondary | ICD-10-CM | POA: Insufficient documentation

## 2017-04-03 DIAGNOSIS — Z9884 Bariatric surgery status: Secondary | ICD-10-CM | POA: Diagnosis not present

## 2017-04-03 DIAGNOSIS — E538 Deficiency of other specified B group vitamins: Secondary | ICD-10-CM | POA: Insufficient documentation

## 2017-04-03 MED ORDER — CYANOCOBALAMIN 1000 MCG/ML IJ SOLN
1000.0000 ug | Freq: Once | INTRAMUSCULAR | Status: AC
  Administered 2017-04-03: 1000 ug via INTRAMUSCULAR
  Filled 2017-04-03: qty 1

## 2017-05-07 ENCOUNTER — Inpatient Hospital Stay: Payer: 59 | Attending: Hematology and Oncology

## 2017-05-07 DIAGNOSIS — M8580 Other specified disorders of bone density and structure, unspecified site: Secondary | ICD-10-CM | POA: Insufficient documentation

## 2017-05-07 DIAGNOSIS — E119 Type 2 diabetes mellitus without complications: Secondary | ICD-10-CM | POA: Diagnosis not present

## 2017-05-07 DIAGNOSIS — Z79899 Other long term (current) drug therapy: Secondary | ICD-10-CM | POA: Diagnosis not present

## 2017-05-07 DIAGNOSIS — E538 Deficiency of other specified B group vitamins: Secondary | ICD-10-CM | POA: Diagnosis not present

## 2017-05-07 DIAGNOSIS — F5089 Other specified eating disorder: Secondary | ICD-10-CM | POA: Insufficient documentation

## 2017-05-07 DIAGNOSIS — G2581 Restless legs syndrome: Secondary | ICD-10-CM | POA: Diagnosis not present

## 2017-05-07 DIAGNOSIS — D509 Iron deficiency anemia, unspecified: Secondary | ICD-10-CM

## 2017-05-07 DIAGNOSIS — D508 Other iron deficiency anemias: Secondary | ICD-10-CM | POA: Diagnosis present

## 2017-05-07 DIAGNOSIS — F17210 Nicotine dependence, cigarettes, uncomplicated: Secondary | ICD-10-CM | POA: Diagnosis not present

## 2017-05-07 DIAGNOSIS — I10 Essential (primary) hypertension: Secondary | ICD-10-CM | POA: Diagnosis not present

## 2017-05-07 DIAGNOSIS — H9319 Tinnitus, unspecified ear: Secondary | ICD-10-CM | POA: Diagnosis not present

## 2017-05-07 DIAGNOSIS — Z9884 Bariatric surgery status: Secondary | ICD-10-CM | POA: Insufficient documentation

## 2017-05-07 DIAGNOSIS — G4730 Sleep apnea, unspecified: Secondary | ICD-10-CM | POA: Insufficient documentation

## 2017-05-07 DIAGNOSIS — Z880 Allergy status to penicillin: Secondary | ICD-10-CM | POA: Diagnosis not present

## 2017-05-07 DIAGNOSIS — E669 Obesity, unspecified: Secondary | ICD-10-CM | POA: Insufficient documentation

## 2017-05-07 DIAGNOSIS — K219 Gastro-esophageal reflux disease without esophagitis: Secondary | ICD-10-CM | POA: Diagnosis not present

## 2017-05-07 LAB — CBC WITH DIFFERENTIAL/PLATELET
Basophils Absolute: 0 10*3/uL (ref 0–0.1)
Basophils Relative: 1 %
Eosinophils Absolute: 0.1 10*3/uL (ref 0–0.7)
Eosinophils Relative: 3 %
HCT: 38.9 % (ref 35.0–47.0)
Hemoglobin: 13.4 g/dL (ref 12.0–16.0)
Lymphocytes Relative: 20 %
Lymphs Abs: 0.8 10*3/uL — ABNORMAL LOW (ref 1.0–3.6)
MCH: 34.2 pg — ABNORMAL HIGH (ref 26.0–34.0)
MCHC: 34.4 g/dL (ref 32.0–36.0)
MCV: 99.5 fL (ref 80.0–100.0)
Monocytes Absolute: 0.3 10*3/uL (ref 0.2–0.9)
Monocytes Relative: 8 %
Neutro Abs: 2.8 10*3/uL (ref 1.4–6.5)
Neutrophils Relative %: 68 %
Platelets: 272 10*3/uL (ref 150–440)
RBC: 3.91 MIL/uL (ref 3.80–5.20)
RDW: 16.2 % — ABNORMAL HIGH (ref 11.5–14.5)
WBC: 4.1 10*3/uL (ref 3.6–11.0)

## 2017-05-07 LAB — FERRITIN: Ferritin: 5 ng/mL — ABNORMAL LOW (ref 11–307)

## 2017-05-07 NOTE — Progress Notes (Signed)
[facility]-  Cancer Center  Clinic day:  05/08/17  Chief Complaint: Brenda Hawkins is a 51 y.o. female status post gastric bypass surgery with iron deficiency anemia and B12 deficiency who is seen for 3 month assessment.  HPI: The patient was last seen in medical oncology clinic on 02/04/2017.  At that time, patient reported tinnitus and ice pica.  She denied bleeding.  Hematocrit was 39.4, hemoglobin 13.6, and MCV 101.0.  B12 level was 250.  Since her last visit, patient has been treated with monthly B12 injections 3.  Symptomatically, she is feeling well. She states, "I feel the best now that I have felt since I started coming".  She has continued tinnitus.  Ice pica continues. She continues to smoke 1 pack of cigarettes, however she is actively trying to quit.  She has still has not scheduled her colonoscopy or mammogram.    Past Medical History:  Diagnosis Date  . Anemia   . Anxiety   . Asthma   . Diabetes mellitus type II   . GERD (gastroesophageal reflux disease)   . Hypertension   . Obesity   . Osteopenia   . Sleep apnea   . 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 Vitamin B12 deficiency    since bariatric surgery    Past Surgical History:  Procedure Laterality Date  . GASTRIC BYPASS  03/17/02    Family History  Problem Relation Age of Onset  . Heart disease Mother        valve replacement surgery  . Coronary artery disease Father     Social History:  reports that she has quit smoking. She has never used smokeless tobacco. She reports that she does not drink alcohol or use drugs.  She quit smoking > 20 years ago, but started smoking again 3-4 years ago.  She smokes 1 pack a day.  She is going on a cruise in 01/2018.  The patient lives in [place].  Allergies:  Allergies  Allergen Reactions  . Codeine Sulfate Other (See Comments)    Reaction: "violently ill"  . Penicillins Other (See Comments)    Reaction: Unsure   Has patient had a PCN reaction causing  immediate rash, facial/tongue/throat swelling, SOB or lightheadedness with hypotension: No Has patient had a PCN reaction causing severe rash involving mucus membranes or skin necrosis: No Has patient had a PCN reaction that required hospitalization No Has patient had a PCN reaction occurring within the last 10 years: No If all of the above answers are "NO", then may proceed with Cephalosporin use.     Current Medications: Current Outpatient Prescriptions  Medication Sig Dispense Refill  . loratadine (CLARITIN) 10 MG tablet Take 10 mg by mouth daily.     No current facility-administered medications for this visit.    Facility-Administered Medications Ordered in Other Visits  Medication Dose Route Frequency Provider Last Rate Last Dose  . sodium chloride 0.9 % injection 10 mL  10 mL Intracatheter PRN Jeralyn Ruths, MD        Review of Systems:  GENERAL: Feels "better than I have felt in awhile".  No fevers or sweats. Weight down 2 pounds. PERFORMANCE STATUS (ECOG):  0 HEENT:  Tinnitus, slight.  No visual changes.  No sore throat, runny nose, sore throat, mouth sores or tenderness. Lungs: No shortness of breath.  No hemoptysis.  h/o sleep apnea, not using CPAP.  Cardiac:  No chest pain, palpitations, orthopnea, or PND. GI:  Ice pica.  No  diarrhea, constipation, melena or hematochezia. GU:  No urgency, frequency, dysuria, or hematuria. Musculoskeletal:  No back pain.  No joint pain.  No muscle tenderness. Extremities:  Intermittent pedal edema.  No change in color. Skin:  No rashes or skin changes. Neuro:  Restless leg.  No numbness or weakness, balance or coordination issues. Endocrine:  No diabetes, thyroid issues, hot flashes or night sweats. Psych:  No mood changes, depression or anxiety.  Sleeping better. Pain:  No focal pain. Review of systems:  All other systems reviewed and found to be negative.   Physical Exam: Blood pressure 121/78, pulse 84, temperature 98.6 F (37  C), temperature source Tympanic, resp. rate 18, weight 120 lb 8 oz (54.7 kg). GENERAL:  Well developed, well nourished, woman sitting comfortably in the exam room in no acute distress. MENTAL STATUS:  Alert and oriented to person, place and time. HEAD:  Short styled blonde hair.  Normocephalic, atraumatic, face symmetric, no Cushingoid features. EYES:  Green eyes.  Pupils equal round and reactive to light and accomodation.  No conjunctivitis or scleral icterus. ENT:  Oropharynx clear without lesion.  Tongue normal. Mucous membranes moist.  RESPIRATORY:  Clear to auscultation without rales, wheezes or rhonchi. CARDIOVASCULAR:  Regular rate and rhythm without murmur, rub or gallop. ABDOMEN:  Soft, non-tender, with active bowel sounds, and no hepatosplenomegaly.  No masses. SKIN:  No rashes, ulcers or lesions. EXTREMITIES: No edema, no skin discoloration or tenderness.  No palpable cords. LYMPH NODES: No palpable cervical, supraclavicular, axillary or inguinal adenopathy  NEUROLOGICAL: Unremarkable. PSYCH:  Appropriate.    Appointment on 05/07/2017  Component Date Value Ref Range Status  . WBC 05/07/2017 4.1  3.6 - 11.0 K/uL Final  . RBC 05/07/2017 3.91  3.80 - 5.20 MIL/uL Final  . Hemoglobin 05/07/2017 13.4  12.0 - 16.0 g/dL Final  . HCT 16/06/9603 38.9  35.0 - 47.0 % Final  . MCV 05/07/2017 99.5  80.0 - 100.0 fL Final  . MCH 05/07/2017 34.2* 26.0 - 34.0 pg Final  . MCHC 05/07/2017 34.4  32.0 - 36.0 g/dL Final  . RDW 54/05/8118 16.2* 11.5 - 14.5 % Final  . Platelets 05/07/2017 272  150 - 440 K/uL Final  . Neutrophils Relative % 05/07/2017 68  % Final  . Neutro Abs 05/07/2017 2.8  1.4 - 6.5 K/uL Final  . Lymphocytes Relative 05/07/2017 20  % Final  . Lymphs Abs 05/07/2017 0.8* 1.0 - 3.6 K/uL Final  . Monocytes Relative 05/07/2017 8  % Final  . Monocytes Absolute 05/07/2017 0.3  0.2 - 0.9 K/uL Final  . Eosinophils Relative 05/07/2017 3  % Final  . Eosinophils Absolute 05/07/2017 0.1  0  - 0.7 K/uL Final  . Basophils Relative 05/07/2017 1  % Final  . Basophils Absolute 05/07/2017 0.0  0 - 0.1 K/uL Final  . Ferritin 05/07/2017 5* 11 - 307 ng/mL Final    Assessment:  Brenda Hawkins is a 51 y.o. female status post gastric bypass surgery in 03/2002 and subsequent development of iron deficiency anemia and B12 deficiency.  She is intolerant of oral iron.  She denies any melena or hematochezia.  Stool guaiacs were negative on 01/02/2015.  Diet is poor.  She denies any melena or hematochezia.  She has not had a colonoscopy.  She has B12 deficiency.  She receives B12 (began 01/16/2015; last 04/03/2017).    She has iron deficiency.  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  She received Venofer  X  4 (01/16/2015 - 02/07/2015), x 1 (10/10/2015), and x 1 (05/06/2017).    Ferritin has been followed: 1.5 on 09/19/2014, 59 on 02/07/2015, 10 on 03/16/2015, 6 on 06/16/2015, 5 on 10/09/2015, 5 on 01/01/2016, 6 on 04/01/2016, 14 on 07/08/2016, and 13 on 08/06/2016.  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  She denies any liver disease.  Thyroid studies have been normal in the past.  Labs on 07/08/2016 revealed the following normal labs:  folate (11.4), TSH (1.563), and reticulocyte count (1.7%).  She has a history of sleep apnea.  She previously used CPAP when she was heavier.  She is not using CPAP now.   She smokes 1 1/2 packs per day.  She has an increasing hematocrit despite low iron stores suggestive of secondary erythrocytosis due to smoking, sleep apnea, or a myeloproliferative disorder.  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  She was taking over 50 BC powders a week for chronic headaches.  She had tinnitus.  Salicylate level was 39.6 (2.8-30).  She was hydrated in the ER on 07/09/2016.  Follow-up salicylate level was 16.3.  Symptomatically, she has ice pica. Weight is down 2 pounds.  Exam is stable.  She needs a mammogram and  colonoscopy. Hematocrit is 38.9. Ferritin is 5.  Plan: 1.  Labs today:  CBC with diff, ferritin. 2.  B12 today. 3.  Venofer today, then weekly x 2 (total of 3 infusions) 4.  RTC monthly x 5 for B12 injections. 5.  Schedule mammogram. 6.  Refer to GI: colonoscopy. 7.  RTC in 3 months for labs (CBC with diff, feritin) and B12. 8.  RTC in 6 months for MD assessment, labs (CBC with diff, ferritin), and B12.   Quentin Mulling, NP  05/08/2017, 2:05 PM   I saw and evaluated the patient, participating in the key portions of the service and reviewing pertinent diagnostic studies and records.  I reviewed the nurse practitioner's note and agree with the findings and the plan.  The assessment and plan were discussed with the patient.  A few questions were asked by the patient and answered.   Rosey Bath, MD 05/08/2017,5:33 PM

## 2017-05-08 ENCOUNTER — Inpatient Hospital Stay: Payer: 59

## 2017-05-08 ENCOUNTER — Inpatient Hospital Stay (HOSPITAL_BASED_OUTPATIENT_CLINIC_OR_DEPARTMENT_OTHER): Payer: 59 | Admitting: Hematology and Oncology

## 2017-05-08 ENCOUNTER — Other Ambulatory Visit: Payer: Self-pay | Admitting: Hematology and Oncology

## 2017-05-08 ENCOUNTER — Encounter: Payer: Self-pay | Admitting: Hematology and Oncology

## 2017-05-08 VITALS — BP 121/78 | HR 84 | Temp 98.6°F | Resp 18 | Wt 120.5 lb

## 2017-05-08 DIAGNOSIS — F5089 Other specified eating disorder: Secondary | ICD-10-CM

## 2017-05-08 DIAGNOSIS — Z79899 Other long term (current) drug therapy: Secondary | ICD-10-CM

## 2017-05-08 DIAGNOSIS — D508 Other iron deficiency anemias: Secondary | ICD-10-CM

## 2017-05-08 DIAGNOSIS — E119 Type 2 diabetes mellitus without complications: Secondary | ICD-10-CM

## 2017-05-08 DIAGNOSIS — Z880 Allergy status to penicillin: Secondary | ICD-10-CM

## 2017-05-08 DIAGNOSIS — Z9884 Bariatric surgery status: Secondary | ICD-10-CM | POA: Diagnosis not present

## 2017-05-08 DIAGNOSIS — M8580 Other specified disorders of bone density and structure, unspecified site: Secondary | ICD-10-CM

## 2017-05-08 DIAGNOSIS — G4730 Sleep apnea, unspecified: Secondary | ICD-10-CM

## 2017-05-08 DIAGNOSIS — E669 Obesity, unspecified: Secondary | ICD-10-CM

## 2017-05-08 DIAGNOSIS — K219 Gastro-esophageal reflux disease without esophagitis: Secondary | ICD-10-CM

## 2017-05-08 DIAGNOSIS — E538 Deficiency of other specified B group vitamins: Secondary | ICD-10-CM

## 2017-05-08 DIAGNOSIS — D509 Iron deficiency anemia, unspecified: Secondary | ICD-10-CM

## 2017-05-08 DIAGNOSIS — K9589 Other complications of other bariatric procedure: Secondary | ICD-10-CM

## 2017-05-08 DIAGNOSIS — I10 Essential (primary) hypertension: Secondary | ICD-10-CM

## 2017-05-08 DIAGNOSIS — F17210 Nicotine dependence, cigarettes, uncomplicated: Secondary | ICD-10-CM | POA: Diagnosis not present

## 2017-05-08 DIAGNOSIS — G2581 Restless legs syndrome: Secondary | ICD-10-CM

## 2017-05-08 DIAGNOSIS — Z1239 Encounter for other screening for malignant neoplasm of breast: Secondary | ICD-10-CM

## 2017-05-08 MED ORDER — IRON SUCROSE 20 MG/ML IV SOLN
200.0000 mg | Freq: Once | INTRAVENOUS | Status: AC
  Administered 2017-05-08: 200 mg via INTRAVENOUS
  Filled 2017-05-08: qty 10

## 2017-05-08 MED ORDER — SODIUM CHLORIDE 0.9 % IV SOLN
Freq: Once | INTRAVENOUS | Status: AC
  Administered 2017-05-08: 15:00:00 via INTRAVENOUS
  Filled 2017-05-08: qty 1000

## 2017-05-08 MED ORDER — CYANOCOBALAMIN 1000 MCG/ML IJ SOLN
1000.0000 ug | Freq: Once | INTRAMUSCULAR | Status: AC
  Administered 2017-05-08: 1000 ug via INTRAMUSCULAR
  Filled 2017-05-08: qty 1

## 2017-05-08 MED ORDER — SODIUM CHLORIDE 0.9 % IV SOLN: 200.0000 mg | Freq: Once | INTRAVENOUS | Status: DC

## 2017-05-08 NOTE — Progress Notes (Signed)
Patient offers no complaints today.  States she feels better than she has in several years.

## 2017-05-15 ENCOUNTER — Inpatient Hospital Stay: Payer: 59 | Attending: Hematology and Oncology

## 2017-05-15 VITALS — BP 130/78 | HR 77 | Temp 97.0°F | Resp 18

## 2017-05-15 DIAGNOSIS — Z9884 Bariatric surgery status: Secondary | ICD-10-CM | POA: Insufficient documentation

## 2017-05-15 DIAGNOSIS — D508 Other iron deficiency anemias: Secondary | ICD-10-CM | POA: Diagnosis present

## 2017-05-15 DIAGNOSIS — E538 Deficiency of other specified B group vitamins: Secondary | ICD-10-CM | POA: Insufficient documentation

## 2017-05-15 MED ORDER — IRON SUCROSE 20 MG/ML IV SOLN
200.0000 mg | Freq: Once | INTRAVENOUS | Status: AC
  Administered 2017-05-15: 200 mg via INTRAVENOUS
  Filled 2017-05-15: qty 10

## 2017-05-15 MED ORDER — SODIUM CHLORIDE 0.9 % IV SOLN
INTRAVENOUS | Status: DC
  Administered 2017-05-15: 14:00:00 via INTRAVENOUS
  Filled 2017-05-15: qty 1000

## 2017-05-22 ENCOUNTER — Inpatient Hospital Stay: Payer: 59

## 2017-05-22 VITALS — BP 153/82 | HR 70 | Temp 98.0°F | Resp 18

## 2017-05-22 DIAGNOSIS — D508 Other iron deficiency anemias: Secondary | ICD-10-CM

## 2017-05-22 MED ORDER — SODIUM CHLORIDE 0.9 % IV SOLN
INTRAVENOUS | Status: DC
  Administered 2017-05-22: 14:00:00 via INTRAVENOUS
  Filled 2017-05-22: qty 1000

## 2017-05-22 MED ORDER — IRON SUCROSE 20 MG/ML IV SOLN
200.0000 mg | Freq: Once | INTRAVENOUS | Status: AC
  Administered 2017-05-22: 200 mg via INTRAVENOUS
  Filled 2017-05-22: qty 10

## 2017-05-29 ENCOUNTER — Ambulatory Visit
Admission: RE | Admit: 2017-05-29 | Discharge: 2017-05-29 | Disposition: A | Discharge status: Home / Self Care | Payer: 59 | Source: Ambulatory Visit | Attending: Urgent Care | Admitting: Urgent Care

## 2017-05-29 DIAGNOSIS — Z1231 Encounter for screening mammogram for malignant neoplasm of breast: Secondary | ICD-10-CM | POA: Diagnosis not present

## 2017-05-29 DIAGNOSIS — Z1239 Encounter for other screening for malignant neoplasm of breast: Secondary | ICD-10-CM

## 2017-05-29 DIAGNOSIS — E538 Deficiency of other specified B group vitamins: Secondary | ICD-10-CM

## 2017-05-29 DIAGNOSIS — D509 Iron deficiency anemia, unspecified: Secondary | ICD-10-CM

## 2017-05-29 DIAGNOSIS — N6489 Other specified disorders of breast: Secondary | ICD-10-CM | POA: Insufficient documentation

## 2017-05-29 DIAGNOSIS — D508 Other iron deficiency anemias: Secondary | ICD-10-CM

## 2017-05-30 ENCOUNTER — Other Ambulatory Visit: Payer: Self-pay | Admitting: Urgent Care

## 2017-05-30 DIAGNOSIS — Z1239 Encounter for other screening for malignant neoplasm of breast: Secondary | ICD-10-CM

## 2017-05-30 DIAGNOSIS — N6489 Other specified disorders of breast: Secondary | ICD-10-CM

## 2017-06-02 ENCOUNTER — Telehealth: Payer: Self-pay | Admitting: *Deleted

## 2017-06-02 NOTE — Telephone Encounter (Signed)
Called patient to inform her that her screening mammogram revealed calcifications in the right breast.  MD recommends repeating mammogram (diagnostic) with [place].  Patient states she has scar tissue in her right breast and does not want to pursue any further testing at this time.  MD notified.

## 2017-06-02 NOTE — Telephone Encounter (Signed)
-----   Message from Rosey Bath, MD sent at 06/01/2017  3:35 PM EDT ----- Regarding: RE: Mammogram  I agree with the mammogram and ultrasound.  Let's make sure that it is scheduled.  We should call the patient before scheduling calls her.  M  ----- Message ----- From: Verlee Monte, NP Sent: 05/30/2017   5:24 PM To: Rosey Bath, MD Subject: Mammogram                                      Screening mammo done this afternoon....   In the right breast, calcifications and a possible asymmetry warrants further evaluation. In the left breast, no findings suspicious for malignancy. Images were processed with CAD.  IMPRESSION: Further evaluation is suggested for calcifications and possible asymmetry in the right breast.  RECOMMENDATION: Diagnostic mammogram and possibly ultrasound of the right breast.  I put in orders for the diagnostic mammo and right breast ultrasound. If you decide you don't want her these studies, we can always cancel. They have NOT been scheduled yet, nor has the patient been made aware.

## 2017-06-09 ENCOUNTER — Inpatient Hospital Stay: Payer: 59 | Attending: Hematology and Oncology

## 2017-06-09 DIAGNOSIS — E538 Deficiency of other specified B group vitamins: Secondary | ICD-10-CM | POA: Diagnosis not present

## 2017-06-09 DIAGNOSIS — D508 Other iron deficiency anemias: Secondary | ICD-10-CM | POA: Diagnosis present

## 2017-06-09 DIAGNOSIS — Z9884 Bariatric surgery status: Secondary | ICD-10-CM | POA: Diagnosis not present

## 2017-06-09 DIAGNOSIS — F5089 Other specified eating disorder: Secondary | ICD-10-CM | POA: Insufficient documentation

## 2017-06-09 MED ORDER — CYANOCOBALAMIN 1000 MCG/ML IJ SOLN
1000.0000 ug | Freq: Once | INTRAMUSCULAR | Status: AC
  Administered 2017-06-09: 1000 ug via INTRAMUSCULAR
  Filled 2017-06-09: qty 1

## 2017-07-08 ENCOUNTER — Inpatient Hospital Stay: Payer: 59

## 2017-07-08 DIAGNOSIS — D508 Other iron deficiency anemias: Secondary | ICD-10-CM | POA: Diagnosis not present

## 2017-07-08 MED ORDER — CYANOCOBALAMIN 1000 MCG/ML IJ SOLN
1000.0000 ug | Freq: Once | INTRAMUSCULAR | Status: AC
  Administered 2017-07-08: 1000 ug via INTRAMUSCULAR

## 2017-08-05 ENCOUNTER — Telehealth: Payer: Self-pay | Admitting: *Deleted

## 2017-08-05 NOTE — Telephone Encounter (Signed)
Copied from CRM [phone]. 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 She is wondering if her PCP bedsole would write an order for her to have a CT. 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 R68.84 for jaw pain. Their contact number is [phone].

## 2017-08-05 NOTE — Telephone Encounter (Signed)
Please clarify with dentist.. CT maxillofacial? Without contrast or with or both?  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 R68.84 for jaw pain

## 2017-08-06 NOTE — Telephone Encounter (Signed)
Spoke with Lorie and she confirmed CPT and Dx Codes.   She said it is not the CT Maxillofacial. I ask that she fax over the exact description of the CT they are wanting and Dr. Ermalene SearingBedsole will write order. Lorie ask that we fax order to her attn at [phone].

## 2017-08-06 NOTE — Telephone Encounter (Signed)
Please hand write order for CT of temporomandibular joint, bilateral, open and closed month w/o contrast.  I can't find this to order in Epic.

## 2017-08-07 ENCOUNTER — Telehealth: Payer: Self-pay

## 2017-08-07 NOTE — Telephone Encounter (Signed)
Copied from CRM [phone]#[id number]. Topic: Inquiry >> Aug 05, 2017  2:29 PM Terisa Starraylor, Brittany L wrote: Reason for CRM:  Lawson FiscalLori from [facility] in [place] called to let us know that the Patient will be having a surgery on her jaw for TMJ & the Dentist Violet Baldyiffany Peters would like the patient to have a CT, but with her insurance it would not be covered. She is wondering if her PCP bedsole would write an order for her to have a CT. 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 R68.84 for jaw pain. Their contact number is [phone].  >> Aug 07, 2017  2:17 PM Arlyss Gandyichardson, Taren N, NT wrote: Lawson FiscalLori from Violet Baldyiffany Peters office is calling needing to speak with Lupita LeashDonna at her earliest convience. She will be in the office until 4 pm today and back in the office after 2 pm tomorrow. CB#: [phone]

## 2017-08-08 ENCOUNTER — Inpatient Hospital Stay: Payer: 59 | Attending: Hematology and Oncology

## 2017-08-08 ENCOUNTER — Inpatient Hospital Stay: Payer: 59

## 2017-08-08 DIAGNOSIS — E538 Deficiency of other specified B group vitamins: Secondary | ICD-10-CM | POA: Insufficient documentation

## 2017-08-08 DIAGNOSIS — D508 Other iron deficiency anemias: Secondary | ICD-10-CM | POA: Diagnosis present

## 2017-08-08 DIAGNOSIS — F5089 Other specified eating disorder: Secondary | ICD-10-CM | POA: Diagnosis not present

## 2017-08-08 DIAGNOSIS — Z9884 Bariatric surgery status: Secondary | ICD-10-CM | POA: Insufficient documentation

## 2017-08-08 DIAGNOSIS — Z1239 Encounter for other screening for malignant neoplasm of breast: Secondary | ICD-10-CM

## 2017-08-08 DIAGNOSIS — D509 Iron deficiency anemia, unspecified: Secondary | ICD-10-CM

## 2017-08-08 DIAGNOSIS — K9589 Other complications of other bariatric procedure: Secondary | ICD-10-CM

## 2017-08-08 LAB — CBC WITH DIFFERENTIAL/PLATELET
Basophils Absolute: 0.1 10*3/uL (ref 0–0.1)
Basophils Relative: 1 %
Eosinophils Absolute: 0.1 10*3/uL (ref 0–0.7)
Eosinophils Relative: 1 %
HCT: 45.5 % (ref 35.0–47.0)
Hemoglobin: 15.7 g/dL (ref 12.0–16.0)
Lymphocytes Relative: 14 %
Lymphs Abs: 0.9 10*3/uL — ABNORMAL LOW (ref 1.0–3.6)
MCH: 37.3 pg — ABNORMAL HIGH (ref 26.0–34.0)
MCHC: 34.6 g/dL (ref 32.0–36.0)
MCV: 107.8 fL — ABNORMAL HIGH (ref 80.0–100.0)
Monocytes Absolute: 0.5 10*3/uL (ref 0.2–0.9)
Monocytes Relative: 7 %
Neutro Abs: 5.3 10*3/uL (ref 1.4–6.5)
Neutrophils Relative %: 77 %
Platelets: 248 10*3/uL (ref 150–440)
RBC: 4.22 MIL/uL (ref 3.80–5.20)
RDW: 14.6 % — ABNORMAL HIGH (ref 11.5–14.5)
WBC: 6.9 10*3/uL (ref 3.6–11.0)

## 2017-08-08 LAB — FERRITIN: Ferritin: 35 ng/mL (ref 11–307)

## 2017-08-08 MED ORDER — CYANOCOBALAMIN 1000 MCG/ML IJ SOLN
1000.0000 ug | Freq: Once | INTRAMUSCULAR | Status: AC
Start: 2017-08-08 — End: 2017-08-08
  Administered 2017-08-08: 1000 ug via INTRAMUSCULAR

## 2017-08-08 NOTE — Telephone Encounter (Signed)
Order faxed to Lorie at [phone].

## 2017-08-08 NOTE — Telephone Encounter (Signed)
In Donna's box in my office 

## 2017-08-11 ENCOUNTER — Other Ambulatory Visit: Payer: Self-pay | Admitting: *Deleted

## 2017-08-11 ENCOUNTER — Telehealth: Payer: Self-pay | Admitting: Hematology and Oncology

## 2017-08-11 DIAGNOSIS — D518 Other vitamin B12 deficiency anemias: Secondary | ICD-10-CM

## 2017-08-11 NOTE — Telephone Encounter (Signed)
Lab/Injection,schd per 08/11/17 schd msg/Anita. Schd and conf with patient.

## 2017-09-08 ENCOUNTER — Inpatient Hospital Stay: Payer: 59 | Attending: Hematology and Oncology

## 2017-09-08 ENCOUNTER — Other Ambulatory Visit: Payer: Self-pay | Admitting: Urgent Care

## 2017-09-08 ENCOUNTER — Inpatient Hospital Stay: Payer: 59

## 2017-09-08 DIAGNOSIS — E538 Deficiency of other specified B group vitamins: Secondary | ICD-10-CM | POA: Diagnosis not present

## 2017-09-08 DIAGNOSIS — D508 Other iron deficiency anemias: Secondary | ICD-10-CM | POA: Diagnosis not present

## 2017-09-08 DIAGNOSIS — Z9884 Bariatric surgery status: Secondary | ICD-10-CM | POA: Diagnosis not present

## 2017-09-08 DIAGNOSIS — D518 Other vitamin B12 deficiency anemias: Secondary | ICD-10-CM

## 2017-09-08 LAB — TSH: TSH: 2.909 u[IU]/mL (ref 0.350–4.500)

## 2017-09-08 LAB — FOLATE: Folate: 6.5 ng/mL (ref >5.9)

## 2017-09-08 MED ORDER — CYANOCOBALAMIN 1000 MCG/ML IJ SOLN
1000.0000 ug | Freq: Once | INTRAMUSCULAR | Status: AC
  Administered 2017-09-08: 1000 ug via INTRAMUSCULAR

## 2017-09-09 LAB — T4: T4, Total: 6.2 ug/dL (ref 4.5–12.0)

## 2017-10-08 ENCOUNTER — Ambulatory Visit: Payer: 59

## 2017-10-09 ENCOUNTER — Inpatient Hospital Stay: Payer: 59 | Attending: Hematology and Oncology

## 2017-10-09 DIAGNOSIS — E538 Deficiency of other specified B group vitamins: Secondary | ICD-10-CM | POA: Insufficient documentation

## 2017-10-09 DIAGNOSIS — D508 Other iron deficiency anemias: Secondary | ICD-10-CM

## 2017-10-09 MED ORDER — CYANOCOBALAMIN 1000 MCG/ML IJ SOLN
1000.0000 ug | Freq: Once | INTRAMUSCULAR | Status: AC
  Administered 2017-10-09: 1000 ug via INTRAMUSCULAR

## 2017-11-04 ENCOUNTER — Other Ambulatory Visit: Payer: Self-pay | Admitting: Hematology and Oncology

## 2017-11-04 ENCOUNTER — Encounter: Payer: Self-pay | Admitting: Hematology and Oncology

## 2017-11-04 ENCOUNTER — Inpatient Hospital Stay: Payer: 59 | Attending: Hematology and Oncology | Admitting: Hematology and Oncology

## 2017-11-04 ENCOUNTER — Inpatient Hospital Stay: Payer: 59

## 2017-11-04 ENCOUNTER — Other Ambulatory Visit: Payer: Self-pay | Admitting: *Deleted

## 2017-11-04 VITALS — BP 151/83 | HR 69 | Temp 98.7°F | Resp 18 | Wt 127.4 lb

## 2017-11-04 DIAGNOSIS — Z1239 Encounter for other screening for malignant neoplasm of breast: Secondary | ICD-10-CM

## 2017-11-04 DIAGNOSIS — D7589 Other specified diseases of blood and blood-forming organs: Secondary | ICD-10-CM

## 2017-11-04 DIAGNOSIS — E119 Type 2 diabetes mellitus without complications: Secondary | ICD-10-CM | POA: Diagnosis not present

## 2017-11-04 DIAGNOSIS — I10 Essential (primary) hypertension: Secondary | ICD-10-CM

## 2017-11-04 DIAGNOSIS — E538 Deficiency of other specified B group vitamins: Secondary | ICD-10-CM | POA: Insufficient documentation

## 2017-11-04 DIAGNOSIS — Z9884 Bariatric surgery status: Secondary | ICD-10-CM | POA: Diagnosis not present

## 2017-11-04 DIAGNOSIS — F17210 Nicotine dependence, cigarettes, uncomplicated: Secondary | ICD-10-CM

## 2017-11-04 DIAGNOSIS — D508 Other iron deficiency anemias: Secondary | ICD-10-CM

## 2017-11-04 DIAGNOSIS — N6489 Other specified disorders of breast: Secondary | ICD-10-CM

## 2017-11-04 DIAGNOSIS — K9589 Other complications of other bariatric procedure: Secondary | ICD-10-CM

## 2017-11-04 DIAGNOSIS — D509 Iron deficiency anemia, unspecified: Secondary | ICD-10-CM

## 2017-11-04 DIAGNOSIS — D751 Secondary polycythemia: Secondary | ICD-10-CM | POA: Insufficient documentation

## 2017-11-04 LAB — COMPREHENSIVE METABOLIC PANEL
ALT: 10 U/L — ABNORMAL LOW (ref 14–54)
AST: 19 U/L (ref 15–41)
Albumin: 4.1 g/dL (ref 3.5–5.0)
Alkaline Phosphatase: 71 U/L (ref 38–126)
Anion gap: 9 (ref 5–15)
BUN: 21 mg/dL — ABNORMAL HIGH (ref 6–20)
CO2: 24 mmol/L (ref 22–32)
Calcium: 9.3 mg/dL (ref 8.9–10.3)
Chloride: 110 mmol/L (ref 101–111)
Creatinine, Ser: 0.68 mg/dL (ref 0.44–1.00)
GFR calc Af Amer: >60 mL/min (ref >60)
GFR calc non Af Amer: >60 mL/min (ref >60)
Glucose, Bld: 81 mg/dL (ref 65–99)
Potassium: 3.9 mmol/L (ref 3.5–5.1)
Sodium: 143 mmol/L (ref 135–145)
Total Bilirubin: 0.2 mg/dL — ABNORMAL LOW (ref 0.3–1.2)
Total Protein: 7.7 g/dL (ref 6.5–8.1)

## 2017-11-04 LAB — CBC WITH DIFFERENTIAL/PLATELET
Basophils Absolute: 0 10*3/uL (ref 0–0.1)
Basophils Relative: 1 %
Eosinophils Absolute: 0.1 10*3/uL (ref 0–0.7)
Eosinophils Relative: 3 %
HCT: 48.4 % — ABNORMAL HIGH (ref 35.0–47.0)
Hemoglobin: 16.6 g/dL — ABNORMAL HIGH (ref 12.0–16.0)
Lymphocytes Relative: 19 %
Lymphs Abs: 1 10*3/uL (ref 1.0–3.6)
MCH: 37.3 pg — ABNORMAL HIGH (ref 26.0–34.0)
MCHC: 34.3 g/dL (ref 32.0–36.0)
MCV: 108.8 fL — ABNORMAL HIGH (ref 80.0–100.0)
Monocytes Absolute: 0.4 10*3/uL (ref 0.2–0.9)
Monocytes Relative: 7 %
Neutro Abs: 3.8 10*3/uL (ref 1.4–6.5)
Neutrophils Relative %: 70 %
Platelets: 243 10*3/uL (ref 150–440)
RBC: 4.45 MIL/uL (ref 3.80–5.20)
RDW: 12.7 % (ref 11.5–14.5)
WBC: 5.3 10*3/uL (ref 3.6–11.0)

## 2017-11-04 LAB — FERRITIN: Ferritin: 6 ng/mL — ABNORMAL LOW (ref 11–307)

## 2017-11-04 LAB — RETICULOCYTES
RBC.: 4.54 MIL/uL (ref 3.80–5.20)
Retic Count, Absolute: 68.1 10*3/uL (ref 19.0–183.0)
Retic Ct Pct: 1.5 % (ref 0.4–3.1)

## 2017-11-04 LAB — FOLATE: Folate: 7.8 ng/mL (ref >5.9)

## 2017-11-04 MED ORDER — CYANOCOBALAMIN 1000 MCG/ML IJ SOLN
1000.0000 ug | Freq: Once | INTRAMUSCULAR | Status: AC
  Administered 2017-11-04: 1000 ug via INTRAMUSCULAR
  Filled 2017-11-04: qty 1

## 2017-11-04 NOTE — Progress Notes (Signed)
[name] day:  11/04/17  Chief Complaint: Brenda Hawkins is a 52 y.o. female status post gastric bypass surgery with iron deficiency anemia and B12 deficiency who is seen for 6 month assessment.  HPI: The patient was last seen in medical oncology clinic on 05/08/2017.  At that time, she had ice pica. Weight was down 2 pounds.  Hematocrit was 38.9. Ferritin was 5.  She received Venofer weekly x 3 (05/08/2017 - 05/22/2017).  She has continued monthly B12 (last 10/09/2017).  CBC on 08/08/2017 revealed a hematocrit of 45.5, hemoglobin 15.7, and MCV 107.8.  Ferritin was 35.  Folate was 6.5.  TSH and T4 were normal on 09/08/2017.  At last visit she was encouraged to follow-up with GI and undergo a mammogram.   Patient has not been seen in consult by GI citing that she is "over it and not thinking about having it done anymore. I am not having any problems. I don't need them to find anything and then send me to another doctor".    Mammogram on 05/29/2017 revealed calcifications and possible asymmetry in the right breast.  Left breast was unremarkable.  Diagnostic mammogram and ultrasound of the right breast were ordered. Patient states, "I knew there was scar tissues and lumps and bumps in there. The longer that it is in there, I understand that it gets hard. I don't need a mammogram to tell me that, or a specialist to tell me anything. I don't even know I have breasts on my body anymore. I am never having another mammogram. I am not having any problems. If I get cancer, I will celebrate because I will be the first one in 4 generations that got it. I will be special. I told you that we were not discussing this anymore until my birthday".   During the interim, patient has been doing "ok". Patient does any acute concerns. Patient denies bleeding; no hematochezia, melena, or vaginal bleeding. Patient notes that her energy is good. She does not have the typical  IDA symptoms. She states, "When I get low, I am different than everybody else. I don't get weak and tired. I get manic like I am now".  Patient eating well. Patient's weight is up 7 pounds.   She denies pain in the clinic today. Patient continues to smoke cigarettes. She smokes 1 1/2 pack per day.   Past Medical History:  Diagnosis Date  . Anemia   . Anxiety   . Asthma   . Diabetes mellitus type II   . GERD (gastroesophageal reflux disease)   . Hypertension   . Obesity   . Osteopenia   . Sleep apnea   . 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 Vitamin B12 deficiency    since bariatric surgery    Past Surgical History:  Procedure Laterality Date  . 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 REDUCTION MAMMAPLASTY Right 1992    Family History  Problem Relation Age of Onset  . Heart disease Mother        valve replacement surgery  . Coronary artery disease Father   . Breast cancer Neg Hx     Social History:  reports that she has quit smoking. she has never used smokeless tobacco. She reports that she does not drink alcohol or use drugs.  She quit smoking > 20 years ago, but started smoking again 3-4 years ago.  She smokes 1 pack a day.  The patient lives in [place]. Patient  is accompanied by her husband, Brenda Hawkins, today.   Allergies:  Allergies  Allergen Reactions  . Codeine Sulfate Other (See Comments)    Reaction: "violently ill"  . Penicillins Other (See Comments)    Reaction: Unsure   Has patient had a PCN reaction causing immediate rash, facial/tongue/throat swelling, SOB or lightheadedness with hypotension: No Has patient had a PCN reaction causing severe rash involving mucus membranes or skin necrosis: No Has patient had a PCN reaction that required hospitalization No Has patient had a PCN reaction occurring within the last 10 years: No If all of the above answers are "NO", then may proceed with Cephalosporin use.     Current Medications: Current Outpatient Medications  Medication Sig Dispense  Refill  . loratadine (CLARITIN) 10 MG tablet Take 10 mg by mouth daily.     No current facility-administered medications for this visit.    Facility-Administered Medications Ordered in Other Visits  Medication Dose Route Frequency Provider Last Rate Last Dose  . sodium chloride 0.9 % injection 10 mL  10 mL Intracatheter PRN Lloyd Huger, MD        Review of Systems:  GENERAL: Feels "good".  No fevers or sweats. Weight up 7pounds. PERFORMANCE STATUS (ECOG):  0 HEENT:  No visual changes.  No sore throat, runny nose, sore throat, mouth sores or tenderness. Lungs: No shortness of breath.  No hemoptysis.  h/o sleep apnea, not using CPAP.  Cardiac:  No chest pain, palpitations, orthopnea, or PND. GI:  No nausea, vomiting, diarrhea, constipation, melena or hematochezia. GU:  No urgency, frequency, dysuria, or hematuria. Musculoskeletal:  No back pain.  No joint pain.  No muscle tenderness. Extremities:  Intermittent pedal edema.  No change in color. Skin:  No rashes or skin changes. Neuro: No numbness or weakness, balance or coordination issues. Endocrine:  No diabetes, thyroid issues, hot flashes or night sweats. Psych:  No mood changes, depression or anxiety.  Pain:  No focal pain. Review of systems:  All other systems reviewed and found to be negative.   Physical Exam: Blood pressure (!) 151/83, pulse 69, temperature 98.7 F (37.1 C), temperature source Tympanic, resp. rate 18, weight 127 lb 7 oz (57.8 kg). GENERAL:  Well developed, well nourished, woman sitting comfortably in the exam room in no acute distress. MENTAL STATUS:  Alert and oriented to person, place and time. HEAD:  Short slightly curly gray hair.  Normocephalic, atraumatic, face symmetric, no Cushingoid features. EYES:  Green eyes.  Pupils equal round and reactive to light and accomodation.  No conjunctivitis or scleral icterus. ENT:  Oropharynx clear without lesion.  Tongue normal. Mucous membranes moist.   RESPIRATORY:  Clear to auscultation without rales, wheezes or rhonchi. CARDIOVASCULAR:  Regular rate and rhythm without murmur, rub or gallop. ABDOMEN:  Soft, non-tender, with active bowel sounds, and no hepatosplenomegaly.  No masses. SKIN:  No rashes, ulcers or lesions. EXTREMITIES: No edema, no skin discoloration or tenderness.  No palpable cords. LYMPH NODES: No palpable cervical, supraclavicular, axillary or inguinal adenopathy  NEUROLOGICAL: Unremarkable. PSYCH:  Appropriate.  Animated.   Appointment on 11/04/2017  Component Date Value Ref Range Status  . Retic Ct Pct 11/04/2017 PENDING  0.4 - 3.1 % Incomplete  . RBC. 11/04/2017 PENDING  3.87 - 5.11 MIL/uL Incomplete  . Retic Count, Absolute 11/04/2017 PENDING  19.0 - 186.0 K/uL Incomplete  . Sodium 11/04/2017 143  135 - 145 mmol/L Final  . Potassium 11/04/2017 3.9  3.5 - 5.1 mmol/L Final  .  Chloride 11/04/2017 110  101 - 111 mmol/L Final  . CO2 11/04/2017 24  22 - 32 mmol/L Final  . Glucose, Bld 11/04/2017 81  65 - 99 mg/dL Final  . BUN 11/04/2017 21* 6 - 20 mg/dL Final  . Creatinine, Ser 11/04/2017 0.68  0.44 - 1.00 mg/dL Final  . Calcium 11/04/2017 9.3  8.9 - 10.3 mg/dL Final  . Total Protein 11/04/2017 7.7  6.5 - 8.1 g/dL Final  . Albumin 11/04/2017 4.1  3.5 - 5.0 g/dL Final  . AST 11/04/2017 19  15 - 41 U/L Final  . ALT 11/04/2017 10* 14 - 54 U/L Final  . Alkaline Phosphatase 11/04/2017 71  38 - 126 U/L Final  . Total Bilirubin 11/04/2017 0.2* 0.3 - 1.2 mg/dL Final  . GFR calc non Af Amer 11/04/2017 >60  >60 mL/min Final  . GFR calc Af Amer 11/04/2017 >60  >60 mL/min Final   Comment: (NOTE) The eGFR has been calculated using the CKD EPI equation. This calculation has not been validated in all clinical situations. eGFR's persistently <60 mL/min signify possible Chronic Kidney Disease.   Georgiann Hahn gap 11/04/2017 9  5 - 15 Final   Performed at [facility], [address] [place], [place] [address]  . WBC  11/04/2017 5.3  3.6 - 11.0 K/uL Final  . RBC 11/04/2017 4.45  3.80 - 5.20 MIL/uL Final  . Hemoglobin 11/04/2017 16.6* 12.0 - 16.0 g/dL Final  . HCT 11/04/2017 48.4* 35.0 - 47.0 % Final  . MCV 11/04/2017 108.8* 80.0 - 100.0 fL Final  . MCH 11/04/2017 37.3* 26.0 - 34.0 pg Final  . MCHC 11/04/2017 34.3  32.0 - 36.0 g/dL Final  . RDW 11/04/2017 12.7  11.5 - 14.5 % Final  . Platelets 11/04/2017 243  150 - 440 K/uL Final  . Neutrophils Relative % 11/04/2017 70  % Final  . Neutro Abs 11/04/2017 3.8  1.4 - 6.5 K/uL Final  . Lymphocytes Relative 11/04/2017 19  % Final  . Lymphs Abs 11/04/2017 1.0  1.0 - 3.6 K/uL Final  . Monocytes Relative 11/04/2017 7  % Final  . Monocytes Absolute 11/04/2017 0.4  0.2 - 0.9 K/uL Final  . Eosinophils Relative 11/04/2017 3  % Final  . Eosinophils Absolute 11/04/2017 0.1  0 - 0.7 K/uL Final  . Basophils Relative 11/04/2017 1  % Final  . Basophils Absolute 11/04/2017 0.0  0 - 0.1 K/uL Final   Performed at [facility], [address] [place], [place] [address]    Assessment:  Brenda Hawkins is a 52 y.o. female status post gastric bypass surgery in 03/2002 and subsequent development of iron deficiency anemia and B12 deficiency.  She is intolerant of oral iron.  She denies any melena or hematochezia.  Stool guaiacs were negative on 01/02/2015.  Diet is poor.  She denies any melena or hematochezia.  She has never had a colonoscopy.  Patient refuses GI evaluation and colonoscopy.  She has B12 deficiency.  She receives B12 (began 01/16/2015; last 10/09/2017).    She has iron deficiency.  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  She received Venofer  X 4 (01/16/2015 - 02/07/2015), x 1 (10/10/2015), x 1 (05/06/2017), and x 3 (05/08/2017 - 05/22/2017)..    Ferritin has been followed: 1.5 on 09/19/2014, 59 on 02/07/2015, 10 on 03/16/2015, 6 on 06/16/2015, 5 on 10/09/2015, 5 on 01/01/2016, 6 on 04/01/2016, 14 on 07/08/2016, 13 on 08/06/2016, 5 on 05/07/2017,  and 35  on 08/08/2017.  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  She denies any liver disease.  Thyroid studies have been normal in the past.  Labs on 07/08/2016 revealed the following normal labs:  folate (11.4), TSH (1.563), and reticulocyte count (1.7%).  Folate was 6.5 on 08/08/2017.  TSH and T4 were normal on 09/08/2017.  She has a history of sleep apnea.  She previously used CPAP when she was heavier.  She is not using CPAP now.   She smokes 1 1/2 packs per day.  She has an increasing hematocrit despite low iron stores suggestive of secondary erythrocytosis due to smoking, sleep apnea, or a myeloproliferative disorder.  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  She was taking over 50 BC powders a week for chronic headaches.  She had tinnitus.  Salicylate level was 03.4 (2.8-30).  She was hydrated in the ER on 07/09/2016.  Follow-up salicylate level was 91.7.  Bilateral mammogram on 05/29/2017 revealed calcifications and possible asymmetry in the right breast.  Left breast was unremarkable.  Diagnostic mammogram and ultrasound of the right breast were ordered, but not performed.  Patient refuses further breast imaging.  Symptomatically, she denies any complaints. Weight is down 2 pounds.  Exam is stable.  She needs a mammogram and colonoscopy. Hematocrit is 48.4 and hemoglobin 16.6.  Ferritin is pending.  Plan: 1.  Labs today:  CBC with diff, ferritin, folate, CMP, retic. 2.  Discuss work-up of polycythemia.  Etiology likely secondary to smoking or possibly sleep apnea.  Add on labs today:  JAK-2 with reflex to exon 12-15, carbon manoxide, EPO level. 3.  Discuss mammogram. Patient with known concerns in RIGHT breast. Refused further interventions or imaging. States, "I am never having another mammogram".  Discuss increased concern for breast cancer. Patient states, "I accept the risk. I will worry about it if I get pain or can't eat".  4.  Discuss need  for follow-up with gastroenterology re: colonoscopy. Patient refusing citing that she "does't need them finding anything and sending her from doctor to doctor". Discuss increased risk for cancer without screening. Patient continues to refuse.  5.  B12 today. Continue monthly injections.  6.  Consider referral to lung cancer screening program. 7.  RTC in 3 months for labs (CBC with diff, feritin) and B12. 8.  RTC in 6 months for MD assessment, labs (CBC with diff, ferritin), and B12.   Honor Loh, NP  11/04/2017, 9:34 AM   I saw and evaluated the patient, participating in the key portions of the service and reviewing pertinent diagnostic studies and records.  I reviewed the nurse practitioner's note and agree with the findings and the plan.  The assessment and plan were discussed with the patient.  A few questions were asked by the patient and answered.   Lequita Asal, MD 11/04/2017, 9:34 AM

## 2017-11-04 NOTE — Progress Notes (Signed)
Patient offers no complaints today.  States she actually feels good and has energy.  She is eating better and has gained weight.

## 2017-11-05 LAB — ERYTHROPOIETIN: Erythropoietin: 22.3 m[IU]/mL — ABNORMAL HIGH (ref 2.6–18.5)

## 2017-11-05 LAB — CARBON MONOXIDE, BLOOD (PERFORMED AT REF LAB): CARBON MONOXIDE, BLOOD: 6.8 % — AB (ref 0.0–3.6)

## 2017-11-06 ENCOUNTER — Ambulatory Visit: Payer: 59

## 2017-11-06 ENCOUNTER — Other Ambulatory Visit: Payer: 59

## 2017-11-06 ENCOUNTER — Ambulatory Visit: Payer: 59 | Admitting: Hematology and Oncology

## 2017-11-11 LAB — JAK2 EXONS 12-15

## 2017-11-11 LAB — JAK2  V617F QUAL. WITH REFLEX TO EXON 12

## 2017-11-28 ENCOUNTER — Other Ambulatory Visit: Payer: Self-pay | Admitting: Urgent Care

## 2017-11-28 DIAGNOSIS — D751 Secondary polycythemia: Secondary | ICD-10-CM

## 2017-12-02 ENCOUNTER — Inpatient Hospital Stay: Payer: 59 | Attending: Hematology and Oncology

## 2017-12-02 DIAGNOSIS — D508 Other iron deficiency anemias: Secondary | ICD-10-CM | POA: Insufficient documentation

## 2017-12-02 MED ORDER — CYANOCOBALAMIN 1000 MCG/ML IJ SOLN
1000.0000 ug | Freq: Once | INTRAMUSCULAR | Status: AC
  Administered 2017-12-02: 1000 ug via INTRAMUSCULAR

## 2017-12-30 ENCOUNTER — Inpatient Hospital Stay: Payer: 59 | Attending: Hematology and Oncology

## 2017-12-30 DIAGNOSIS — E538 Deficiency of other specified B group vitamins: Secondary | ICD-10-CM | POA: Diagnosis present

## 2017-12-30 DIAGNOSIS — D508 Other iron deficiency anemias: Secondary | ICD-10-CM

## 2017-12-30 MED ORDER — CYANOCOBALAMIN 1000 MCG/ML IJ SOLN
1000.0000 ug | Freq: Once | INTRAMUSCULAR | Status: AC
  Administered 2017-12-30: 1000 ug via INTRAMUSCULAR

## 2018-02-03 ENCOUNTER — Inpatient Hospital Stay: Payer: 59

## 2018-02-03 ENCOUNTER — Inpatient Hospital Stay: Payer: 59 | Attending: Hematology and Oncology

## 2018-02-03 DIAGNOSIS — E538 Deficiency of other specified B group vitamins: Secondary | ICD-10-CM | POA: Insufficient documentation

## 2018-02-03 DIAGNOSIS — D751 Secondary polycythemia: Secondary | ICD-10-CM

## 2018-02-03 DIAGNOSIS — D509 Iron deficiency anemia, unspecified: Secondary | ICD-10-CM

## 2018-02-03 DIAGNOSIS — D508 Other iron deficiency anemias: Secondary | ICD-10-CM

## 2018-02-03 LAB — CBC WITH DIFFERENTIAL/PLATELET
Basophils Absolute: 0.2 10*3/uL — ABNORMAL HIGH (ref 0–0.1)
Basophils Relative: 3 %
Eosinophils Absolute: 0.1 10*3/uL (ref 0–0.7)
Eosinophils Relative: 1 %
HCT: 42.3 % (ref 35.0–47.0)
Hemoglobin: 14.7 g/dL (ref 12.0–16.0)
Lymphocytes Relative: 11 %
Lymphs Abs: 0.7 10*3/uL — ABNORMAL LOW (ref 1.0–3.6)
MCH: 36.4 pg — ABNORMAL HIGH (ref 26.0–34.0)
MCHC: 34.7 g/dL (ref 32.0–36.0)
MCV: 105.2 fL — ABNORMAL HIGH (ref 80.0–100.0)
Monocytes Absolute: 0.4 10*3/uL (ref 0.2–0.9)
Monocytes Relative: 7 %
Neutro Abs: 5.1 10*3/uL (ref 1.4–6.5)
Neutrophils Relative %: 78 %
Platelets: 271 10*3/uL (ref 150–440)
RBC: 4.03 MIL/uL (ref 3.80–5.20)
RDW: 15.1 % — ABNORMAL HIGH (ref 11.5–14.5)
WBC: 6.6 10*3/uL (ref 3.6–11.0)

## 2018-02-03 LAB — FERRITIN: Ferritin: 7 ng/mL — ABNORMAL LOW (ref 11–307)

## 2018-02-03 MED ORDER — CYANOCOBALAMIN 1000 MCG/ML IJ SOLN
1000.0000 ug | Freq: Once | INTRAMUSCULAR | Status: AC
  Administered 2018-02-03: 1000 ug via INTRAMUSCULAR

## 2018-02-04 LAB — ERYTHROPOIETIN: Erythropoietin: 24.6 m[IU]/mL — ABNORMAL HIGH (ref 2.6–18.5)

## 2018-02-09 ENCOUNTER — Inpatient Hospital Stay: Payer: 59 | Attending: Hematology and Oncology

## 2018-02-09 DIAGNOSIS — E538 Deficiency of other specified B group vitamins: Secondary | ICD-10-CM | POA: Insufficient documentation

## 2018-02-09 DIAGNOSIS — Z9884 Bariatric surgery status: Secondary | ICD-10-CM | POA: Insufficient documentation

## 2018-02-09 DIAGNOSIS — D508 Other iron deficiency anemias: Secondary | ICD-10-CM | POA: Insufficient documentation

## 2018-02-09 MED ORDER — SODIUM CHLORIDE 0.9 % IV SOLN
INTRAVENOUS | Status: DC
  Administered 2018-02-09: 14:00:00 via INTRAVENOUS
  Filled 2018-02-09: qty 1000

## 2018-02-09 MED ORDER — IRON SUCROSE 20 MG/ML IV SOLN
200.0000 mg | Freq: Once | INTRAVENOUS | Status: AC
  Administered 2018-02-09: 200 mg via INTRAVENOUS
  Filled 2018-02-09: qty 10

## 2018-02-16 ENCOUNTER — Inpatient Hospital Stay: Payer: 59

## 2018-02-16 VITALS — BP 146/78 | HR 74 | Resp 18

## 2018-02-16 DIAGNOSIS — D508 Other iron deficiency anemias: Secondary | ICD-10-CM

## 2018-02-16 MED ORDER — SODIUM CHLORIDE 0.9 % IV SOLN
INTRAVENOUS | Status: DC
  Administered 2018-02-16: 14:00:00 via INTRAVENOUS
  Filled 2018-02-16: qty 1000

## 2018-02-16 MED ORDER — IRON SUCROSE 20 MG/ML IV SOLN
200.0000 mg | Freq: Once | INTRAVENOUS | Status: AC
  Administered 2018-02-16: 200 mg via INTRAVENOUS
  Filled 2018-02-16: qty 10

## 2018-02-23 ENCOUNTER — Inpatient Hospital Stay: Payer: 59

## 2018-02-23 VITALS — BP 165/88 | HR 68 | Temp 98.0°F | Resp 18

## 2018-02-23 DIAGNOSIS — D508 Other iron deficiency anemias: Secondary | ICD-10-CM

## 2018-02-23 MED ORDER — IRON SUCROSE 20 MG/ML IV SOLN
200.0000 mg | Freq: Once | INTRAVENOUS | Status: AC
Start: 2018-02-23 — End: 2018-02-23
  Administered 2018-02-23: 200 mg via INTRAVENOUS
  Filled 2018-02-23: qty 10

## 2018-02-23 MED ORDER — SODIUM CHLORIDE 0.9 % IV SOLN
Freq: Once | INTRAVENOUS | Status: AC
  Administered 2018-02-23: 14:00:00 via INTRAVENOUS
  Filled 2018-02-23: qty 1000

## 2018-03-03 ENCOUNTER — Inpatient Hospital Stay: Payer: 59

## 2018-03-03 ENCOUNTER — Other Ambulatory Visit: Payer: 59

## 2018-03-03 DIAGNOSIS — D508 Other iron deficiency anemias: Secondary | ICD-10-CM

## 2018-03-03 MED ORDER — CYANOCOBALAMIN 1000 MCG/ML IJ SOLN
1000.0000 ug | Freq: Once | INTRAMUSCULAR | Status: AC
  Administered 2018-03-03: 1000 ug via INTRAMUSCULAR

## 2018-03-31 ENCOUNTER — Other Ambulatory Visit: Payer: 59

## 2018-03-31 ENCOUNTER — Inpatient Hospital Stay: Payer: 59 | Attending: Hematology and Oncology

## 2018-03-31 DIAGNOSIS — Z9884 Bariatric surgery status: Secondary | ICD-10-CM | POA: Diagnosis not present

## 2018-03-31 DIAGNOSIS — Z87891 Personal history of nicotine dependence: Secondary | ICD-10-CM | POA: Insufficient documentation

## 2018-03-31 DIAGNOSIS — R109 Unspecified abdominal pain: Secondary | ICD-10-CM | POA: Insufficient documentation

## 2018-03-31 DIAGNOSIS — I10 Essential (primary) hypertension: Secondary | ICD-10-CM | POA: Diagnosis not present

## 2018-03-31 DIAGNOSIS — K2970 Gastritis, unspecified, without bleeding: Secondary | ICD-10-CM | POA: Insufficient documentation

## 2018-03-31 DIAGNOSIS — D509 Iron deficiency anemia, unspecified: Secondary | ICD-10-CM | POA: Insufficient documentation

## 2018-03-31 DIAGNOSIS — E538 Deficiency of other specified B group vitamins: Secondary | ICD-10-CM | POA: Diagnosis not present

## 2018-03-31 DIAGNOSIS — K5900 Constipation, unspecified: Secondary | ICD-10-CM | POA: Diagnosis not present

## 2018-03-31 DIAGNOSIS — D508 Other iron deficiency anemias: Secondary | ICD-10-CM

## 2018-03-31 MED ORDER — CYANOCOBALAMIN 1000 MCG/ML IJ SOLN
1000.0000 ug | Freq: Once | INTRAMUSCULAR | Status: AC
  Administered 2018-03-31: 1000 ug via INTRAMUSCULAR
  Filled 2018-03-31: qty 1

## 2018-04-03 ENCOUNTER — Telehealth: Payer: Self-pay | Admitting: *Deleted

## 2018-04-03 NOTE — Telephone Encounter (Signed)
Patient called reporting that she thinks she is having side effects firm her iron infusion last month. She is asking to be seen, She reports abdominal pain, squeezing pain which makes her chest hurt, constipation and now dry heaves, She is asking for an appointment, but did not want to come today. Appointment given fro Monday per patient request

## 2018-04-06 ENCOUNTER — Encounter: Payer: Self-pay | Admitting: Nurse Practitioner

## 2018-04-06 ENCOUNTER — Inpatient Hospital Stay (HOSPITAL_BASED_OUTPATIENT_CLINIC_OR_DEPARTMENT_OTHER): Payer: 59 | Admitting: Nurse Practitioner

## 2018-04-06 ENCOUNTER — Other Ambulatory Visit: Payer: Self-pay

## 2018-04-06 VITALS — BP 155/87 | HR 73 | Temp 98.2°F | Resp 18 | Wt 122.7 lb

## 2018-04-06 DIAGNOSIS — K2970 Gastritis, unspecified, without bleeding: Secondary | ICD-10-CM | POA: Diagnosis not present

## 2018-04-06 DIAGNOSIS — E538 Deficiency of other specified B group vitamins: Secondary | ICD-10-CM

## 2018-04-06 DIAGNOSIS — R109 Unspecified abdominal pain: Secondary | ICD-10-CM

## 2018-04-06 DIAGNOSIS — D509 Iron deficiency anemia, unspecified: Secondary | ICD-10-CM | POA: Diagnosis not present

## 2018-04-06 DIAGNOSIS — Z87891 Personal history of nicotine dependence: Secondary | ICD-10-CM

## 2018-04-06 DIAGNOSIS — I10 Essential (primary) hypertension: Secondary | ICD-10-CM

## 2018-04-06 DIAGNOSIS — Z9884 Bariatric surgery status: Secondary | ICD-10-CM

## 2018-04-06 DIAGNOSIS — K5900 Constipation, unspecified: Secondary | ICD-10-CM

## 2018-04-06 NOTE — Progress Notes (Signed)
Symptom Management Clinic [facility]  Telephone:([phone] [phone]  Patient Care Team: Excell Seltzer, MD as PCP - General Rosey Bath, MD as Consulting Physician (Hematology)   Name of the patient: Brenda Hawkins  [id number]  [birth year]   Date of visit: 04/06/18  Diagnosis-iron deficiency anemia and B12 deficiency  Chief complaint/ Reason for visit- Abdominal Pain and Constipation  Heme history: Patient is status post gastric bypass surgery in 03/2002 and subsequent development of iron deficiency anemia and B12 deficiency.  She has consistently been symptomatic of ice pica and restless legs over years. She has been intolerant to oral iron all and has received supplemental Venofer and B12.  Stool guaiacs were negative on 01/02/2015.  No prior colonoscopy or GI eval (pt refusal).  Last received Venofer and B12 injection on 03/31/2018.   Interval history- Brenda Hawkins is a 52 y.o. female who presents to Symptom Management clinic for complaints of abdominal pain and constipation. She reports increased abdominal bloating, nocturnal burning, bilious reflux, and abdominal discomfort over past month. She feels symptoms are worse since her last infusion of iron and she is concerned that she is having these symptoms as reaction to iron infusion. She feels symptoms are worsening and occurring more frequently.  Symptoms occur day and night when they occur at night the impair her sleep.  She denies cough, wheezing, weight loss, black stools, hematemesis, diarrhea, fever, throat clearing, globus sensation, or sob.   She also complains of constipation. She reports hardened stool ~ 2 times per week which is significantly reduced. She has tried taking colace at home without improvement. She tried taking metamucil yesterday which improved her symptoms somewhat. She does not drink water consistently and does not eat a high fiber diet regularly. She tried eating a  high fat meal yesterday to attempt to have a BM without success. She does not exercise regularly.   She has previously discussed these symptoms with Dr. Merlene Pulling who recommended work-up with GI which patient refused. She reports stress at home (primary caregiver for her mother-in-law who is currently taking chemotherapy) and not wanting 'another doctor'.   ECOG FS:1 - Symptomatic but completely ambulatory  Review of systems- Review of Systems  Constitutional: Negative for chills, fever, malaise/fatigue and weight loss.  HENT: Negative for congestion, ear discharge, ear pain, sinus pain, sore throat and tinnitus.   Eyes: Negative.   Respiratory: Negative.  Negative for cough, sputum production and shortness of breath.   Cardiovascular: Negative for chest pain, palpitations, orthopnea, claudication and leg swelling.  Gastrointestinal: Positive for abdominal pain and constipation. Negative for blood in stool, diarrhea, heartburn, nausea and vomiting.  Genitourinary: Negative.   Musculoskeletal: Negative.   Skin: Negative.   Neurological: Negative for dizziness, tingling, weakness and headaches.  Endo/Heme/Allergies: Negative.   Psychiatric/Behavioral: Negative.     Current treatment- Venofer and B12  Allergies  Allergen Reactions  . Codeine Sulfate Other (See Comments)    Reaction: "violently ill"  . Penicillins Other (See Comments)    Reaction: Unsure   Has patient had a PCN reaction causing immediate rash, facial/tongue/throat swelling, SOB or lightheadedness with hypotension: No Has patient had a PCN reaction causing severe rash involving mucus membranes or skin necrosis: No Has patient had a PCN reaction that required hospitalization No Has patient had a PCN reaction occurring within the last 10 years: No If all of the above answers are "NO", then may proceed with Cephalosporin use.     Past  Medical History:  Diagnosis Date  . Anemia   . Anxiety   . Asthma   . Diabetes  mellitus type II   . GERD (gastroesophageal reflux disease)   . Hypertension   . Obesity   . Osteopenia   . Sleep apnea   . 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 Vitamin B12 deficiency    since bariatric surgery    Past Surgical History:  Procedure Laterality Date  . 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 REDUCTION MAMMAPLASTY Right 1992    Social History   Socioeconomic History  . Marital status: Married    Spouse name: Not on file  . Number of children: 1  . Years of education: Not on file  . Highest education level: Not on file  Occupational History  . Occupation: homemaker  Social Needs  . Financial resource strain: Not on file  . Food insecurity:    Worry: Not on file    Inability: Not on file  . Transportation needs:    Medical: Not on file    Non-medical: Not on file  Tobacco Use  . Smoking status: Former Games developermoker  . Smokeless tobacco: Never Used  . Tobacco comment: Quit 1998  Substance and Sexual Activity  . Alcohol use: No  . Drug use: No  . Sexual activity: Not on file  Lifestyle  . Physical activity:    Days per week: Not on file    Minutes per session: Not on file  . Stress: Not on file  Relationships  . Social connections:    Talks on phone: Not on file    Gets together: Not on file    Attends religious service: Not on file    Active member of club or organization: Not on file    Attends meetings of clubs or organizations: Not on file    Relationship status: Not on file  . Intimate partner violence:    Fear of current or ex partner: Not on file    Emotionally abused: Not on file    Physically abused: Not on file    Forced sexual activity: Not on file  Other Topics Concern  . Not on file  Social History Narrative   Sporadic with exercise    Family History  Problem Relation Age of Onset  . Heart disease Mother        valve replacement surgery  . Coronary artery disease Father   . Breast cancer Neg Hx     Current Outpatient Medications:  .  loratadine (CLARITIN)  10 MG tablet, Take 10 mg by mouth daily., Disp: , Rfl:  No current facility-administered medications for this visit.   Facility-Administered Medications Ordered in Other Visits:  .  sodium chloride 0.9 % injection 10 mL, 10 mL, Intracatheter, PRN, Jeralyn RuthsFinnegan, Timothy J, MD  Physical exam:  Vitals:   04/06/18 0905  BP: (!) 155/87  Pulse: 73  Resp: 18  Temp: 98.2 F (36.8 C)  TempSrc: Tympanic  Weight: 122 lb 11.2 oz (55.7 kg)    GENERAL: Well-developed, well-nourished.  No acute distress.  Accompanied by husband who contributed to history and ros HEENT:  Sclerae anicteric.  Oropharynx clear and moist. No ulcerations or evidence of oropharyngeal candidiasis. Neck is supple.  LUNGS:  Clear to auscultation bilaterally.  No wheezes or rhonchi. HEART:  Regular rate and rhythm. No murmur appreciated. ABDOMEN:  Soft, nontender.  Positive, normoactive bowel sounds. No organomegaly palpated.  MSK:  No focal spinal tenderness to palpation. Full range of  motion bilaterally in the upper extremities.  Ambulates without aids EXTREMITIES:  No peripheral edema.   SKIN:  Clear with no obvious rashes or skin changes. No nail dyscrasia. NEURO:  Nonfocal. Well oriented.   PSYCH: Appropriate.    CMP Latest Ref Rng & Units 11/04/2017  Glucose 65 - 99 mg/dL 81  BUN 6 - 20 mg/dL 57(Q)  Creatinine 4.69 - 1.00 mg/dL 6.29  Sodium 528 - 413 mmol/L 143  Potassium 3.5 - 5.1 mmol/L 3.9  Chloride 101 - 111 mmol/L 110  CO2 22 - 32 mmol/L 24  Calcium 8.9 - 10.3 mg/dL 9.3  Total Protein 6.5 - 8.1 g/dL 7.7  Total Bilirubin 0.3 - 1.2 mg/dL 2.4(M)  Alkaline Phos 38 - 126 U/L 71  AST 15 - 41 U/L 19  ALT 14 - 54 U/L 10(L)   CBC Latest Ref Rng & Units 02/03/2018  WBC 3.6 - 11.0 K/uL 6.6  Hemoglobin 12.0 - 16.0 g/dL 01.0  Hematocrit 27.2 - 47.0 % 42.3  Platelets 150 - 440 K/uL 271    No images are attached to the encounter.  No results found.  Assessment and plan- Patient is a 52 y.o. female with history of  iron deficiency anemia and B12 deficiency who presents to symptom management clinic for abdominal pain and constipation.  1.  Anemia-iron deficiency and B12- s/p gastric bypass with chronic iron deficiency and B12 anemia.  Was intolerant to oral iron and receives IV Venofer and B12 injections.  Anemia managed by Dr. Merlene Pulling.  Most recent Venofer and B12 on 03/31/2018.  Do not feel that symptoms today r/t infusion and/or injection or underlying anemia. Follow up with Dr. Merlene Pulling for continued management of anemia.    2. Constipation- gradually and recently worsening. Encouraged increased fluid, movement, and high-fiber diet.  Also discussed use of MiraLAX daily with +/- senna. Dr. Danton Sewer notes were reviewed which documented patient had previously refused GI eval/work-up.  Patient has not had screening colonoscopy and today we discussed in depth the rationale and usefulness of direct visualization of the colon for cancer screening as well as further work-up for constipation/abdominal pain.  Patient now agrees to proceed with referral to GI for evaluation and consideration of colonoscopy.  3. GERD- symptoms consistent with acid reflux/gerd-like.  The pathophysiology of acid reflux was discussed.  We discussed anti-reflex measures such as raising the head of the bed, avoiding tight clothing or belts, avoiding eating late at night and/or not lying down after meals, as well as achieving and maintaining a healthy BMI.  He was recommended to avoid aspirin, NSAIDs, caffeine, peppermint, alcohol, and tobacco.  We discussed that OTC H2 blockers and/or antacids are up to very helpful for as needed use however, given that she has chronic and daily symptoms I would recommend a trial of PPIs such as Nexium.  Given her history and worsening symptoms I would recommend referral to GI for further evaluation and consideration of EGD to rule out Barrett's.  We discussed that it may be also possible to schedule EGD and  colonoscopy at same time.  I requested that she should alert me if she has persistent symptoms, unrelieved symptoms, dysphasia, unexplained weight loss, or GI bleeding.   She agrees to referral to GI for evaluation and consideration of EGD and colonoscopy.  She does request that this be scheduled in approximately 6 weeks as she is currently caregiver for her mother-in-law who is receiving chemotherapy at this time.  This seems reasonable to me.  Patient advised to notify the clinic if there is no improvement in symptoms or if symptoms worsen in next 3-4 days.   rtc as scheduled for follow-up with Dr. Merlene Pulling.    Visit Diagnosis 1. Iron deficiency anemia, unspecified iron deficiency anemia type   2. Gastritis, presence of bleeding unspecified, unspecified chronicity, unspecified gastritis type   3. Constipation, unspecified constipation type     Patient expressed understanding and was in agreement with this plan. She also understands that She can call clinic at any time with any questions, concerns, or complaints.   Thank you for allowing me to participate in the care of this very pleasant patient.   A total of (25) minutes of face-to-face time was spent with this patient with greater than 50% of that time in counseling and care-coordination.  Consuello Masse, DNP, AGNP-C [facility] [phone] (work cell) [phone] (office)   CC: Dr. Merlene Pulling

## 2018-04-06 NOTE — Progress Notes (Signed)
Since last iron infusion late June 17th ) -stated  She has had left sided back / and abd  pain and now pain on both sides . Stated upper abd- feels a pain and " it radiates up " feels nausea w dry heaves and something with nausea takes milk and feels better . -then able to eat.  This sensations comes and goes . Stated dry heaves yesterday. Stated past dx of GERD.  Constipated x 3d and took colace and miralax -had formed BM this am.

## 2018-04-16 ENCOUNTER — Telehealth: Payer: Self-pay | Admitting: Nurse Practitioner

## 2018-04-16 NOTE — Telephone Encounter (Signed)
Call patient to follow-up after recent symptom management clinic visit.  She states that her bowel movements have normalized with use of stool softeners and her GI symptoms including abdominal pain/pressure have resolved with use of Nexium.  She continues to have some back pain.  Recommended she follow-up with her PCP for further management of back pain.  She has scheduled GI appointment.  Patient very appreciative of call and thanked for interventions that have significantly improved her symptoms.  No further intervention at this time.  She can call clinic for new or worsening symptoms.

## 2018-05-05 ENCOUNTER — Inpatient Hospital Stay: Payer: 59

## 2018-05-05 ENCOUNTER — Inpatient Hospital Stay: Payer: 59 | Attending: Hematology and Oncology

## 2018-05-05 ENCOUNTER — Other Ambulatory Visit: Payer: Self-pay | Admitting: Hematology and Oncology

## 2018-05-05 ENCOUNTER — Encounter: Payer: Self-pay | Admitting: Hematology and Oncology

## 2018-05-05 ENCOUNTER — Inpatient Hospital Stay (HOSPITAL_BASED_OUTPATIENT_CLINIC_OR_DEPARTMENT_OTHER): Payer: 59 | Admitting: Hematology and Oncology

## 2018-05-05 VITALS — BP 161/90 | HR 66 | Temp 97.7°F | Resp 18 | Wt 127.3 lb

## 2018-05-05 DIAGNOSIS — E538 Deficiency of other specified B group vitamins: Secondary | ICD-10-CM | POA: Diagnosis not present

## 2018-05-05 DIAGNOSIS — D751 Secondary polycythemia: Secondary | ICD-10-CM | POA: Insufficient documentation

## 2018-05-05 DIAGNOSIS — D509 Iron deficiency anemia, unspecified: Secondary | ICD-10-CM

## 2018-05-05 DIAGNOSIS — D7589 Other specified diseases of blood and blood-forming organs: Secondary | ICD-10-CM | POA: Diagnosis not present

## 2018-05-05 DIAGNOSIS — D508 Other iron deficiency anemias: Secondary | ICD-10-CM

## 2018-05-05 LAB — CBC WITH DIFFERENTIAL/PLATELET
Basophils Absolute: 0.1 10*3/uL (ref 0–0.1)
Basophils Relative: 1 %
Eosinophils Absolute: 0.2 10*3/uL (ref 0–0.7)
Eosinophils Relative: 3 %
HCT: 44.4 % (ref 35.0–47.0)
Hemoglobin: 15.3 g/dL (ref 12.0–16.0)
Lymphocytes Relative: 16 %
Lymphs Abs: 0.8 10*3/uL — ABNORMAL LOW (ref 1.0–3.6)
MCH: 37.6 pg — ABNORMAL HIGH (ref 26.0–34.0)
MCHC: 34.4 g/dL (ref 32.0–36.0)
MCV: 109.2 fL — ABNORMAL HIGH (ref 80.0–100.0)
Monocytes Absolute: 0.3 10*3/uL (ref 0.2–0.9)
Monocytes Relative: 6 %
Neutro Abs: 3.8 10*3/uL (ref 1.4–6.5)
Neutrophils Relative %: 74 %
Platelets: 235 10*3/uL (ref 150–440)
RBC: 4.06 MIL/uL (ref 3.80–5.20)
RDW: 15.1 % — ABNORMAL HIGH (ref 11.5–14.5)
WBC: 5.1 10*3/uL (ref 3.6–11.0)

## 2018-05-05 LAB — TSH: TSH: 2.357 u[IU]/mL (ref 0.350–4.500)

## 2018-05-05 LAB — FERRITIN: Ferritin: 19 ng/mL (ref 11–307)

## 2018-05-05 MED ORDER — CYANOCOBALAMIN 1000 MCG/ML IJ SOLN
1000.0000 ug | Freq: Once | INTRAMUSCULAR | Status: AC
  Administered 2018-05-05: 1000 ug via INTRAMUSCULAR
  Filled 2018-05-05: qty 1

## 2018-05-05 NOTE — Progress Notes (Signed)
[facility]  Clinic day:  05/05/18  Chief Complaint: Brenda Hawkins is a 52 y.o. female status post gastric bypass surgery with iron deficiency anemia and B12 deficiency who is seen for 6 month assessment.  HPI: The patient was last seen in medical oncology clinic by me on 11/04/2017.  At that time, she denied any complaints.  Weight was down 2 pounds.  Exam wasstable.  She needed a mammogram and colonoscopy. Hematocrit was 48.4 and hemoglobin 16.6.  MCV was 108.8.  Ferritin was 6.  Folate was 7.8.  Polycythema was felt secondary to smoking or possibly sleep apnea.  She underwent evaluation for polycythemia.  Carbon monoxide level was 6.8% (0-3.6%; smokers < 9.9%).  JAK2 V617F and exon 12-15 negative.  Epo level was 22.3 (2.6-18.5)  She declined mammogram and colonoscopy.  We discussed referral to the lung cancer screening clinic.  She has continued monthly B12 injections (last 03/31/2018).  CBC on 02/03/2018 revealed a hematocrit of 42.3, hemoglobin 14.7, MCV 105.2, platelets 271,000, WBC 6600 with an ANC of 5100.  Ferritin was 7.  Epo level was 24.6.  She was seen in the symptom management clinic on 04/06/2018 by Consuello Masse, NP for abdominal pain and constipation.  Increased fluids, activity, high fiber, Miralax and +/- senna were recommended.  Symptomatically, patient is having pain in her sides and back just before voiding. She notes that the pain will resolve after she voids. Patient complaining of abdominal pain and constipation. She notes that it feels like "when I took oral iron". Patient has epigastric pain. She states, "I thought I was having a heart attack the first time it happened. I feels like someone grabbed that area and was squeezing the damn life out of it". Symptoms intermittent x 2 months. She was seen in 03/2018 in the [facility] clinic. Following that visit, patient can "tolerate the pain now". Patient is scheduled to see Dr. Allegra Lai on  05/22/2018 for consideration of an EGD and colonoscopy.    Past Medical History:  Diagnosis Date  . Anemia   . Anxiety   . Asthma   . Diabetes mellitus type II   . GERD (gastroesophageal reflux disease)   . Hypertension   . Obesity   . Osteopenia   . Sleep apnea   . 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 Vitamin B12 deficiency    since bariatric surgery    Past Surgical History:  Procedure Laterality Date  . 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 REDUCTION MAMMAPLASTY Right 1992    Family History  Problem Relation Age of Onset  . Heart disease Mother        valve replacement surgery  . Coronary artery disease Father   . Breast cancer Neg Hx     Social History:  reports that she has quit smoking. She has never used smokeless tobacco. She reports that she does not drink alcohol or use drugs.  She quit smoking > 20 years ago, but started smoking again 3-4 years ago.  She smokes 1 pack a day.  The patient lives in [place]. Patient is accompanied by her husband, Brenda Hawkins, today.   Allergies:  Allergies  Allergen Reactions  . Codeine Sulfate Other (See Comments)    Reaction: "violently ill"  . Penicillins Other (See Comments)    Reaction: Unsure   Has patient had a PCN reaction causing immediate rash, facial/tongue/throat swelling, SOB or lightheadedness with hypotension: No Has patient had a PCN reaction causing severe rash involving  mucus membranes or skin necrosis: No Has patient had a PCN reaction that required hospitalization No Has patient had a PCN reaction occurring within the last 10 years: No If all of the above answers are "NO", then may proceed with Cephalosporin use.     Current Medications: Current Outpatient Medications  Medication Sig Dispense Refill  . esomeprazole (NEXIUM) 20 MG capsule Take 20 mg by mouth daily at 12 noon.    . loratadine (CLARITIN) 10 MG tablet Take 10 mg by mouth daily.     No current facility-administered medications for this visit.     Facility-Administered Medications Ordered in Other Visits  Medication Dose Route Frequency Provider Last Rate Last Dose  . sodium chloride 0.9 % injection 10 mL  10 mL Intracatheter PRN Jeralyn Ruths, MD        Review of Systems:  GENERAL:  Feels "alright".  No fevers, sweats or weight loss. PERFORMANCE STATUS (ECOG):  0 HEENT:  No visual changes, runny nose, sore throat, mouth sores or tenderness. Lungs: No shortness of breath or cough.  No hemoptysis. H/o sleep apnea. Cardiac:  No chest pain, palpitations, orthopnea, or PND. GI:  Constipation.  No nausea, vomiting, diarrhea, melena or hematochezia. GU:  Discomfort before voiding.  No urgency, frequency, dysuria, or hematuria. Musculoskeletal:  No back pain.  No joint pain.  No muscle tenderness. Extremities:  No pain or swelling. Skin:  No rashes or skin changes. Neuro:  No headache, numbness or weakness, balance or coordination issues. Endocrine:  No diabetes, thyroid issues, hot flashes or night sweats. Psych:  No mood changes, depression or anxiety. Pain:  Back and side pain. Review of systems:  All other systems reviewed and found to be negative.   Physical Exam: Blood pressure (!) 161/90, pulse 66, temperature 97.7 F (36.5 C), temperature source Tympanic, resp. rate 18, weight 127 lb 5 oz (57.7 kg), SpO2 98 %. GENERAL:  Well developed, well nourished, woman sitting comfortably in the exam room in no acute distress. MENTAL STATUS:  Alert and oriented to person, place and time. HEAD:  Silver/gray styled hair.  Normocephalic, atraumatic, face symmetric, no Cushingoid features. EYES:  Green eyes.  Pupils equal round and reactive to light and accomodation.  No conjunctivitis or scleral icterus. ENT:  Oropharynx clear without lesion.  Tongue normal. Mucous membranes moist.  RESPIRATORY:  Clear to auscultation without rales, wheezes or rhonchi. CARDIOVASCULAR:  Regular rate and rhythm without murmur, rub or gallop. ABDOMEN:   Soft, non-tender, with active bowel sounds, and no hepatosplenomegaly.  No masses. SKIN:  No rashes, ulcers or lesions. EXTREMITIES: No edema, no skin discoloration or tenderness.  No palpable cords. LYMPH NODES: No palpable cervical, supraclavicular, axillary or inguinal adenopathy  NEUROLOGICAL: Unremarkable. PSYCH:  Appropriate.    Appointment on 05/05/2018  Component Date Value Ref Range Status  . Ferritin 05/05/2018 19  11 - 307 ng/mL Final   Performed at [facility] [address] [place] [place], [place] [address]  . WBC 05/05/2018 5.1  3.6 - 11.0 K/uL Final  . RBC 05/05/2018 4.06  3.80 - 5.20 MIL/uL Final  . Hemoglobin 05/05/2018 15.3  12.0 - 16.0 g/dL Final  . HCT 60/45/4098 44.4  35.0 - 47.0 % Final  . MCV 05/05/2018 109.2* 80.0 - 100.0 fL Final  . MCH 05/05/2018 37.6* 26.0 - 34.0 pg Final  . MCHC 05/05/2018 34.4  32.0 - 36.0 g/dL Final  . RDW 11/91/4782 15.1* 11.5 - 14.5 % Final  . Platelets 05/05/2018  235  150 - 440 K/uL Final  . Neutrophils Relative % 05/05/2018 74  % Final  . Neutro Abs 05/05/2018 3.8  1.4 - 6.5 K/uL Final  . Lymphocytes Relative 05/05/2018 16  % Final  . Lymphs Abs 05/05/2018 0.8* 1.0 - 3.6 K/uL Final  . Monocytes Relative 05/05/2018 6  % Final  . Monocytes Absolute 05/05/2018 0.3  0.2 - 0.9 K/uL Final  . Eosinophils Relative 05/05/2018 3  % Final  . Eosinophils Absolute 05/05/2018 0.2  0 - 0.7 K/uL Final  . Basophils Relative 05/05/2018 1  % Final  . Basophils Absolute 05/05/2018 0.1  0 - 0.1 K/uL Final   Performed at [facility], [address] [place], [place] [address]  . TSH 05/05/2018 2.357  0.350 - 4.500 uIU/mL Final   Comment: Performed by a 3rd Generation assay with a functional sensitivity of <=0.01 uIU/mL. Performed at [facility], [address] [place], [place] [address]     Assessment:  Brenda Hawkins is a 52 y.o. female status post gastric bypass surgery in 03/2002 and subsequent development of iron  deficiency anemia and B12 deficiency.  She is intolerant of oral iron.  She denies any melena or hematochezia.  Stool guaiacs were negative on 01/02/2015.  Diet is poor.  She denies any melena or hematochezia.  She has never had a colonoscopy.  Patient refuses GI evaluation and colonoscopy.  She has B12 deficiency.  She receives B12 (began 01/16/2015; last 10/09/2017).    She has iron deficiency.  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  She received Venofer  X 4 (01/16/2015 - 02/07/2015), x 1 (10/10/2015), x 1 (05/06/2017), and x 3 (05/08/2017 - 05/22/2017), and x3 (02/09/2018 - 02/23/2018).   Ferritin has been followed: 1.5 on 09/19/2014, 59 on 02/07/2015, 10 on 03/16/2015, 6 on 06/16/2015, 5 on 10/09/2015, 5 on 01/01/2016, 6 on 04/01/2016, 14 on 07/08/2016, 13 on 08/06/2016, 5 on 05/07/2017, 35 on 08/08/2017, 6 on 11/04/2017, 7 on 02/03/2018, and 19 on 05/05/2018.  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  She denies any liver disease.  Thyroid studies have been normal in the past.  Labs on 07/08/2016 revealed the following normal labs:  folate (11.4), TSH (1.563), and reticulocyte count (1.7%).  Folate was 6.5 on 08/08/2017.  TSH and T4 were normal on 09/08/2017.  She has a history of sleep apnea.  She previously used CPAP when she was heavier.  She is not using CPAP now.   She smokes 1 1/2 packs per day.  She has an increasing hematocrit despite low iron stores suggestive of secondary erythrocytosis due to smoking, sleep apnea, or a myeloproliferative disorder.  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  She was taking over 50 BC powders a week for chronic headaches.  She had tinnitus.  Salicylate level was 39.6 (2.8-30).  She was hydrated in the ER on 07/09/2016.  Follow-up salicylate level was 16.3.  Bilateral mammogram on 05/29/2017 revealed calcifications and possible asymmetry in the right breast.  Left breast was unremarkable.  Diagnostic  mammogram and ultrasound of the right breast were ordered, but not performed.  Patient refuses further breast imaging.  Symptomatically, she has had abdominal pain associated with constipation.  She is scheduled to see GI.  Exam is unremarkable.  Plan: 1.  Labs today:  CBC with diff, ferritin, TSH. 2.  Iron deficiency:  Iron stores remain low.  Hematocrit normal. 3.  B12 deficiency:  B12  today.  Continue monthly B12 injections. 4.  Macrocytic RBC indices:  Etiology unclear.  No known liver disease or MDS.  B12 deficiency treated.  Folate normal on 11/04/2017  Check TSH today.  Consider bone marrow. 5.  Abdominal discomfort:  Patient scheduled to see Dr. Allegra Lai to discuss endoscopy.  If negative, consider abdomen/pelvic CT. 6.  Discuss smoking cessation. 7.  Discuss mammogram - "no probs with boobage".  8.  RTC in 3 months for labs (CBC with diff, ferritin) and B12. 9.  RTC in 6 months for MD assessment and labs (CBC with diff, ferritin, folate), and B12.   Quentin Mulling, NP  05/05/2018, 11:12 AM   I saw and evaluated the patient, participating in the key portions of the service and reviewing pertinent diagnostic studies and records.  I reviewed the nurse practitioner's note and agree with the findings and the plan.  The assessment and plan were discussed with the patient.  A few questions were asked by the patient and answered.   Rosey Bath, MD  05/05/2018, 11:12 AM

## 2018-05-05 NOTE — Progress Notes (Signed)
Pt in for follow up, reports saw Lauren, NP end of August for abdominal discomfort and constipation.  Pt reports has resolved, taking nexium per NP, having daily stools. Reports craving ice chips less.

## 2018-05-08 ENCOUNTER — Telehealth: Payer: Self-pay | Admitting: *Deleted

## 2018-05-08 NOTE — Telephone Encounter (Signed)
Received referral for lung screening. Contacted patient and discussed lung screening program. Will contact again Feb of 2021 when patient is 52 years old and eligible related to age. Patient is in agreement with this plan.

## 2018-05-22 ENCOUNTER — Ambulatory Visit (INDEPENDENT_AMBULATORY_CARE_PROVIDER_SITE_OTHER): Payer: 59 | Admitting: Gastroenterology

## 2018-05-22 ENCOUNTER — Other Ambulatory Visit: Payer: Self-pay

## 2018-05-22 ENCOUNTER — Encounter: Payer: Self-pay | Admitting: Gastroenterology

## 2018-05-22 VITALS — BP 137/83 | HR 81 | Resp 17 | Wt 129.8 lb

## 2018-05-22 DIAGNOSIS — G8929 Other chronic pain: Secondary | ICD-10-CM | POA: Diagnosis not present

## 2018-05-22 DIAGNOSIS — R1013 Epigastric pain: Secondary | ICD-10-CM

## 2018-05-22 NOTE — Progress Notes (Signed)
Arlyss Repress, MD [address]  [address]  [place], [place] [address]  Main: [phone]  Fax: [phone]    Gastroenterology Consultation  Referring Provider:     Alinda Dooms, NP Primary Care Physician:  Excell Seltzer, MD Primary Gastroenterologist:  Dr. Arlyss Repress Reason for Consultation:  chronic Iron deficiency        HPI:   Brenda Hawkins is a 52 y.o. female referred by Dr. Excell Seltzer, MD  for consultation & management of chronic iron deficiency and to discuss about screening colonoscopy. She is history of gastric bypass surgery approximately 25 years ago, with chronic iron deficiency and B12 deficiency. She has history of chronic iron deficiency, was initially anemic. She has been receiving parenteral iron and intramuscular B-12 injections regularly. Her anemia has resolved. However, she continues to be iron deficient. She reports approximately two-month history of epigastric discomfort without any other associated symptoms. Her weight has been stable. She smokes cigarettes almost pack a day. Dr. Merlene Pulling has been trying to convince her to undergo screening colonoscopy and also for workup of unexplained iron deficiency despite receiving parenteral iron therapy regularly. Patient has been reluctant and is not willing to undergo colonoscopy. She reports having one episode of constipation which has resolved. She denies rectal bleeding, change in stool caliber or altered bowel habits. She is here today to discuss only about upper endoscopy because of ongoing epigastric discomfort She denies alcohol use NSAIDs: none  Antiplts/Anticoagulants/Anti thrombotics: none  GI Procedures: none She denies family history of GI malignancy  Past Medical History:  Diagnosis Date  . Anemia   . Anxiety   . Asthma   . Diabetes mellitus type II   . GERD (gastroesophageal reflux disease)   . Hypertension   . Obesity   . Osteopenia   . Sleep apnea   . Urachal cyst 9/01    . Vitamin B12 deficiency    since bariatric surgery    Past Surgical History:  Procedure Laterality Date  . 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 REDUCTION MAMMAPLASTY Right 1992    Current Outpatient Medications:  .  esomeprazole (NEXIUM) 20 MG capsule, Take 20 mg by mouth daily at 12 noon., Disp: , Rfl:  .  loratadine (CLARITIN) 10 MG tablet, Take 10 mg by mouth daily., Disp: , Rfl:  No current facility-administered medications for this visit.   Facility-Administered Medications Ordered in Other Visits:  .  sodium chloride 0.9 % injection 10 mL, 10 mL, Intracatheter, PRN, Orlie Dakin, Tollie Pizza, MD    Family History  Problem Relation Age of Onset  . Heart disease Mother        valve replacement surgery  . Coronary artery disease Father   . Breast cancer Neg Hx      Social History   Tobacco Use  . Smoking status: Current Every Day Smoker  . Smokeless tobacco: Never Used  . Tobacco comment: Quit 1998  Substance Use Topics  . Alcohol use: No  . Drug use: No    Allergies as of 05/22/2018 - Review Complete 05/22/2018  Allergen Reaction Noted  . Codeine sulfate Other (See Comments)   . Penicillins Other (See Comments) 06/03/2007    Review of Systems:    All systems reviewed and negative except where noted in HPI.   Physical Exam:  BP 137/83 (BP Location: Left Arm, Patient Position: Sitting, Cuff Size: Normal)   Pulse 81   Resp 17   Wt 129 lb  12.8 oz (58.9 kg)   BMI 25.35 kg/m  No LMP recorded. Patient is postmenopausal.  General:   Alert,  Well-developed, well-nourished, pleasant and cooperative in NAD Head:  Normocephalic and atraumatic. Eyes:  Sclera clear, no icterus.   Conjunctiva pink. Ears:  Normal auditory acuity. Nose:  No deformity, discharge, or lesions. Mouth:  No deformity or lesions,oropharynx pink & moist. Neck:  Supple; no masses or thyromegaly. Lungs:  Respirations even and unlabored.  Clear throughout to auscultation.   No wheezes, crackles, or rhonchi.  No acute distress. Heart:  Regular rate and rhythm; no murmurs, clicks, rubs, or gallops. Abdomen:  Normal bowel sounds. Soft, non-tender and non-distended without masses, hepatosplenomegaly or hernias noted.  No guarding or rebound tenderness.   Rectal: Not performed Msk:  Symmetrical without gross deformities. Good, equal movement & strength bilaterally. Pulses:  Normal pulses noted. Extremities:  No clubbing or edema.  No cyanosis. Neurologic:  Alert and oriented x3;  grossly normal neurologically. Skin:  Intact without significant lesions or rashes. No jaundice. Lymph Nodes:  No significant cervical adenopathy. Psych:  Alert and cooperative. Normal mood and affect.  Imaging Studies: No abdominal imaging  Assessment and Plan:   Brenda Hawkins is a 52 y.o. caucasian female with chronic tobacco use, history of gastric bypass, chronic iron deficiency and B12 deficiency.  History of iron deficiency anemia which is currently resolved. She presents with two-month history of epigastric discomfort. She is also overdue for colon cancer screening  Epigastric pain: Recommend EGD for further evaluation If EGD is unremarkable, recommend CT A/P to evaluate for other intra-abdominal pathology  Chronic iron deficiency: Discussed in length with patient about implications of colonoscopy, particularly to rule out malignancy. Patient said that she does not want to know if she has cancer because she feels fine. She said she will undergo colonoscopy if she notices any changes in her bowel habits   Follow up in 4 weeks   Arlyss Repressohini R Demarr Kluever, MD

## 2018-06-01 ENCOUNTER — Other Ambulatory Visit: Payer: Self-pay

## 2018-06-01 ENCOUNTER — Ambulatory Visit: Payer: 59 | Admitting: Anesthesiology

## 2018-06-01 ENCOUNTER — Ambulatory Visit
Admission: RE | Admit: 2018-06-01 | Discharge: 2018-06-01 | Disposition: A | Discharge status: Home / Self Care | Payer: 59 | Source: Ambulatory Visit | Attending: Gastroenterology | Admitting: Gastroenterology

## 2018-06-01 ENCOUNTER — Encounter
Admission: RE | Disposition: A | Discharge status: Home / Self Care | Payer: Self-pay | Source: Ambulatory Visit | Attending: Gastroenterology

## 2018-06-01 ENCOUNTER — Encounter: Payer: Self-pay | Admitting: *Deleted

## 2018-06-01 DIAGNOSIS — R1013 Epigastric pain: Secondary | ICD-10-CM | POA: Insufficient documentation

## 2018-06-01 DIAGNOSIS — Z79899 Other long term (current) drug therapy: Secondary | ICD-10-CM | POA: Diagnosis not present

## 2018-06-01 DIAGNOSIS — I10 Essential (primary) hypertension: Secondary | ICD-10-CM | POA: Insufficient documentation

## 2018-06-01 DIAGNOSIS — Z9884 Bariatric surgery status: Secondary | ICD-10-CM | POA: Insufficient documentation

## 2018-06-01 DIAGNOSIS — Z980 Intestinal bypass and anastomosis status: Secondary | ICD-10-CM

## 2018-06-01 DIAGNOSIS — D500 Iron deficiency anemia secondary to blood loss (chronic): Secondary | ICD-10-CM | POA: Diagnosis present

## 2018-06-01 DIAGNOSIS — E119 Type 2 diabetes mellitus without complications: Secondary | ICD-10-CM | POA: Diagnosis not present

## 2018-06-01 DIAGNOSIS — F17200 Nicotine dependence, unspecified, uncomplicated: Secondary | ICD-10-CM | POA: Diagnosis not present

## 2018-06-01 DIAGNOSIS — G8929 Other chronic pain: Secondary | ICD-10-CM

## 2018-06-01 DIAGNOSIS — J449 Chronic obstructive pulmonary disease, unspecified: Secondary | ICD-10-CM | POA: Insufficient documentation

## 2018-06-01 DIAGNOSIS — K922 Gastrointestinal hemorrhage, unspecified: Secondary | ICD-10-CM | POA: Diagnosis not present

## 2018-06-01 DIAGNOSIS — Z6824 Body mass index (BMI) 24.0-24.9, adult: Secondary | ICD-10-CM | POA: Insufficient documentation

## 2018-06-01 DIAGNOSIS — K3189 Other diseases of stomach and duodenum: Secondary | ICD-10-CM

## 2018-06-01 DIAGNOSIS — K254 Chronic or unspecified gastric ulcer with hemorrhage: Secondary | ICD-10-CM | POA: Diagnosis not present

## 2018-06-01 DIAGNOSIS — K219 Gastro-esophageal reflux disease without esophagitis: Secondary | ICD-10-CM | POA: Insufficient documentation

## 2018-06-01 DIAGNOSIS — K284 Chronic or unspecified gastrojejunal ulcer with hemorrhage: Secondary | ICD-10-CM | POA: Diagnosis not present

## 2018-06-01 DIAGNOSIS — G4730 Sleep apnea, unspecified: Secondary | ICD-10-CM | POA: Diagnosis not present

## 2018-06-01 DIAGNOSIS — K2951 Unspecified chronic gastritis with bleeding: Secondary | ICD-10-CM | POA: Insufficient documentation

## 2018-06-01 DIAGNOSIS — E669 Obesity, unspecified: Secondary | ICD-10-CM | POA: Insufficient documentation

## 2018-06-01 HISTORY — PX: ESOPHAGOGASTRODUODENOSCOPY (EGD) WITH PROPOFOL: SHX5813

## 2018-06-01 SURGERY — ESOPHAGOGASTRODUODENOSCOPY (EGD) WITH PROPOFOL
Anesthesia: General

## 2018-06-01 MED ORDER — PROPOFOL 500 MG/50ML IV EMUL
INTRAVENOUS | Status: DC | PRN
  Administered 2018-06-01: 150 ug/kg/min via INTRAVENOUS

## 2018-06-01 MED ORDER — ESOMEPRAZOLE MAGNESIUM 40 MG PO PACK: 40.0000 mg | PACK | Freq: Two times a day (BID) | ORAL | 0 refills | Status: DC

## 2018-06-01 MED ORDER — PROPOFOL 10 MG/ML IV BOLUS
INTRAVENOUS | Status: DC | PRN
  Administered 2018-06-01: 40 mg via INTRAVENOUS
  Administered 2018-06-01: 20 mg via INTRAVENOUS

## 2018-06-01 MED ORDER — MIDAZOLAM HCL 2 MG/2ML IJ SOLN
INTRAMUSCULAR | Status: AC
  Filled 2018-06-01: qty 2

## 2018-06-01 MED ORDER — FENTANYL CITRATE (PF) 100 MCG/2ML IJ SOLN
INTRAMUSCULAR | Status: DC | PRN
  Administered 2018-06-01: 50 ug via INTRAVENOUS

## 2018-06-01 MED ORDER — PROPOFOL 500 MG/50ML IV EMUL
INTRAVENOUS | Status: AC
  Filled 2018-06-01: qty 50

## 2018-06-01 MED ORDER — MIDAZOLAM HCL 2 MG/2ML IJ SOLN
INTRAMUSCULAR | Status: DC | PRN
  Administered 2018-06-01: 2 mg via INTRAVENOUS

## 2018-06-01 MED ORDER — FENTANYL CITRATE (PF) 100 MCG/2ML IJ SOLN
INTRAMUSCULAR | Status: AC
  Filled 2018-06-01: qty 2

## 2018-06-01 MED ORDER — SODIUM CHLORIDE 0.9 % IV SOLN
INTRAVENOUS | Status: DC
  Administered 2018-06-01: 1000 mL via INTRAVENOUS
  Administered 2018-06-01: 11:00:00 via INTRAVENOUS

## 2018-06-01 NOTE — H&P (Signed)
Arlyss Repressohini R Lj Miyamoto, MD [address]  [address]  [place], [place] [address]  Main: [phone]  Fax: [phone] Pager: [phone]  Primary Care Physician:  Excell SeltzerBedsole, Amy E, MD Primary Gastroenterologist:  Dr. Arlyss Repressohini R Uri Turnbough  Pre-Procedure History & Physical: HPI:  Brenda Hawkins is a 52 y.o. female is here for an endoscopy.   Past Medical History:  Diagnosis Date  . Anemia   . Anxiety   . Asthma   . Diabetes mellitus type II   . GERD (gastroesophageal reflux disease)   . Hypertension   . Obesity   . Osteopenia   . Sleep apnea   . 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 Vitamin B12 deficiency    since bariatric surgery    Past Surgical History:  Procedure Laterality Date  . 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 REDUCTION MAMMAPLASTY Right 1992    Prior to Admission medications   Medication Sig Start Date End Date Taking? Authorizing Provider  esomeprazole (NEXIUM) 20 MG capsule Take 20 mg by mouth daily at 12 noon.   Yes [provider]  loratadine (CLARITIN) 10 MG tablet Take 10 mg by mouth daily.   Yes [provider]    Allergies as of 05/22/2018 - Review Complete 05/22/2018  Allergen Reaction Noted  . Codeine sulfate Other (See Comments)   . Penicillins Other (See Comments) 06/03/2007    Family History  Problem Relation Age of Onset  . Heart disease Mother        valve replacement surgery  . Coronary artery disease Father   . Breast cancer Neg Hx     Social History   Socioeconomic History  . Marital status: Married    Spouse name: Not on file  . Number of children: 1  . Years of education: Not on file  . Highest education level: Not on file  Occupational History  . Occupation: homemaker  Social Needs  . Financial resource strain: Not on file  . Food insecurity:    Worry: Not on file    Inability: Not on file  . Transportation needs:    Medical: Not on file    Non-medical: Not on file  Tobacco Use  . Smoking status: Current Every Day Smoker  .  Smokeless tobacco: Never Used  . Tobacco comment: Quit 1998  Substance and Sexual Activity  . Alcohol use: No  . Drug use: No  . Sexual activity: Not on file  Lifestyle  . Physical activity:    Days per week: Not on file    Minutes per session: Not on file  . Stress: Not on file  Relationships  . Social connections:    Talks on phone: Not on file    Gets together: Not on file    Attends religious service: Not on file    Active member of club or organization: Not on file    Attends meetings of clubs or organizations: Not on file    Relationship status: Not on file  . Intimate partner violence:    Fear of current or ex partner: Not on file    Emotionally abused: Not on file    Physically abused: Not on file    Forced sexual activity: Not on file  Other Topics Concern  . Not on file  Social History Narrative   Sporadic with exercise    Review of Systems: See HPI, otherwise negative ROS  Physical Exam: BP (!) 160/90   Pulse 74   Temp (!) 96.7 F (  35.9 C) (Tympanic)   Resp 18   Ht 5' (1.524 m)   Wt 56.7 kg   SpO2 100%   BMI 24.41 kg/m  General:   Alert,  pleasant and cooperative in NAD Head:  Normocephalic and atraumatic. Neck:  Supple; no masses or thyromegaly. Lungs:  Clear throughout to auscultation.    Heart:  Regular rate and rhythm. Abdomen:  Soft, nontender and nondistended. Normal bowel sounds, without guarding, and without rebound.   Neurologic:  Alert and  oriented x4;  grossly normal neurologically.  Impression/Plan: Brenda Hawkins is here for an EGD to be performed for epigastric pain  Risks, benefits, limitations, and alternatives regarding  endoscopy have been reviewed with the patient.  Questions have been answered.  All parties agreeable.   Lannette Donath, MD  06/01/2018, 9:48 AM

## 2018-06-01 NOTE — Anesthesia Preprocedure Evaluation (Signed)
Anesthesia Evaluation  Patient identified by MRN, date of birth, ID band Patient awake    Reviewed: Allergy & Precautions, H&P , NPO status , Patient's Chart, lab work & pertinent test results  History of Anesthesia Complications Negative for: history of anesthetic complications  Airway Mallampati: III  TM Distance: <3 FB Neck ROM: full    Dental  (+) Chipped, Poor Dentition   Pulmonary asthma , sleep apnea , COPD, Current Smoker,           Cardiovascular Exercise Tolerance: Good hypertension, (-) angina(-) Past MI and (-) DOE      Neuro/Psych PSYCHIATRIC DISORDERS negative neurological ROS     GI/Hepatic Neg liver ROS, GERD  Medicated and Controlled,  Endo/Other  diabetes, Type 2  Renal/GU negative Renal ROS  negative genitourinary   Musculoskeletal   Abdominal   Peds  Hematology negative hematology ROS (+)   Anesthesia Other Findings Past Medical History: No date: Anemia No date: Anxiety No date: Asthma No date: Diabetes mellitus type II No date: GERD (gastroesophageal reflux disease) No date: Hypertension No date: Obesity No date: Osteopenia No date: Sleep apnea 9/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 No date: Vitamin B12 deficiency     Comment:  since bariatric surgery  Past Surgical History: 03/17/02: GASTRIC BYPASS 1992: REDUCTION MAMMAPLASTY; Right  BMI    Body Mass Index:  24.41 kg/m      Reproductive/Obstetrics negative OB ROS                             Anesthesia Physical Anesthesia Plan  ASA: III  Anesthesia Plan: General   Post-op Pain Management:    Induction: Intravenous  PONV Risk Score and Plan: Propofol infusion and TIVA  Airway Management Planned: Natural Airway and Nasal Cannula  Additional Equipment:   Intra-op Plan:   Post-operative Plan:   Informed Consent: I have reviewed the patients History and Physical, chart, labs and discussed the procedure including  the risks, benefits and alternatives for the proposed anesthesia with the patient or authorized representative who has indicated his/her understanding and acceptance.   Dental Advisory Given  Plan Discussed with: Anesthesiologist, CRNA and Surgeon  Anesthesia Plan Comments: (Patient consented for risks of anesthesia including but not limited to:  - adverse reactions to medications - risk of intubation if required - damage to teeth, lips or other oral mucosa - sore throat or hoarseness - Damage to heart, brain, lungs or loss of life  Patient voiced understanding.)        Anesthesia Quick Evaluation

## 2018-06-01 NOTE — Anesthesia Procedure Notes (Signed)
Date/Time: 06/01/2018 11:39 AM Performed by: Henrietta HooverPope, Elchonon Maxson, CRNA Oxygen Delivery Method: Nasal cannula Placement Confirmation: positive ETCO2 Comments: NAW out

## 2018-06-01 NOTE — Transfer of Care (Signed)
Immediate Anesthesia Transfer of Care Note  Patient: Brenda Hawkins  Procedure(s) Performed: ESOPHAGOGASTRODUODENOSCOPY (EGD) WITH PROPOFOL (N/A )  Patient Location: PACU  Anesthesia Type:General  Level of Consciousness: awake, alert  and oriented  Airway & Oxygen Therapy: Patient Spontanous Breathing and Patient connected to nasal cannula oxygen  Post-op Assessment: Report given to RN and Post -op Vital signs reviewed and stable  Post vital signs: Reviewed and stable  Last Vitals:  Vitals Value Taken Time  BP 126/76 06/01/2018 11:42 AM  Temp    Pulse 71 06/01/2018 11:43 AM  Resp 21 06/01/2018 11:43 AM  SpO2 100 % 06/01/2018 11:43 AM  Vitals shown include unvalidated device data.  Last Pain:  Vitals:   06/01/18 1142  TempSrc: Tympanic  PainSc:          Complications: No apparent anesthesia complications

## 2018-06-01 NOTE — Anesthesia Post-op Follow-up Note (Signed)
Anesthesia QCDR form completed.        

## 2018-06-01 NOTE — Anesthesia Postprocedure Evaluation (Signed)
Anesthesia Post Note  Patient: Brenda Hawkins  Procedure(s) Performed: ESOPHAGOGASTRODUODENOSCOPY (EGD) WITH PROPOFOL (N/A )  Patient location during evaluation: Endoscopy Anesthesia Type: General Level of consciousness: awake and alert Pain management: pain level controlled Vital Signs Assessment: post-procedure vital signs reviewed and stable Respiratory status: spontaneous breathing, nonlabored ventilation, respiratory function stable and patient connected to nasal cannula oxygen Cardiovascular status: blood pressure returned to baseline and stable Postop Assessment: no apparent nausea or vomiting Anesthetic complications: no     Last Vitals:  Vitals:   06/01/18 1202 06/01/18 1212  BP: 139/84 (!) 159/79  Pulse: 66 64  Resp: (!) 26 (!) 26  Temp:    SpO2: 98% 99%    Last Pain:  Vitals:   06/01/18 1212  TempSrc:   PainSc: 0-No pain                 Cleda MccreedyJoseph K Piscitello

## 2018-06-01 NOTE — Op Note (Signed)
[facility] Gastroenterology Patient Name: Brenda Hawkins Procedure Date: 06/01/2018 10:51 AM MRN: [id number] Account #: [id number] Date of Birth: 01/30/1966 Admit Type: Outpatient Age: 52 Room: [facility] ENDO ROOM 3 Gender: Female Note Status: Finalized Procedure:            Upper GI endoscopy Indications:          Epigastric abdominal pain, Iron deficiency anemia                        secondary to chronic blood loss Providers:            Lin Landsman MD, MD Referring MD:         Dr Valorie Roosevelt, MD (Referring MD) Medicines:            Monitored Anesthesia Care Complications:        No immediate complications. Estimated blood loss:                        Minimal. Procedure:            Pre-Anesthesia Assessment:                       - Prior to the procedure, a History and Physical was                        performed, and patient medications and allergies were                        reviewed. The patient is competent. The risks and                        benefits of the procedure and the sedation options and                        risks were discussed with the patient. All questions                        were answered and informed consent was obtained.                        Patient identification and proposed procedure were                        verified by the physician, the anesthesiologist, the                        anesthetist and the technician in the pre-procedure                        area in the procedure room in the endoscopy suite.                        Mental Status Examination: alert and oriented. Airway                        Examination: normal oropharyngeal airway and neck                        mobility. Respiratory Examination: clear to  auscultation. CV Examination: normal. Prophylactic                        Antibiotics: The patient does not require prophylactic                        antibiotics. Prior Anticoagulants: The  patient has                        taken no previous anticoagulant or antiplatelet agents.                        ASA Grade Assessment: III - A patient with severe                        systemic disease. After reviewing the risks and                        benefits, the patient was deemed in satisfactory                        condition to undergo the procedure. The anesthesia plan                        was to use monitored anesthesia care (MAC). Immediately                        prior to administration of medications, the patient was                        re-assessed for adequacy to receive sedatives. The                        heart rate, respiratory rate, oxygen saturations, blood                        pressure, adequacy of pulmonary ventilation, and                        response to care were monitored throughout the                        procedure. The physical status of the patient was                        re-assessed after the procedure.                       After obtaining informed consent, the endoscope was                        passed under direct vision. Throughout the procedure,                        the patient's blood pressure, pulse, and oxygen                        saturations were monitored continuously. The Endoscope                        was introduced  through the mouth, and advanced to the                        second part of duodenum. The upper GI endoscopy was                        somewhat difficult due to the patient's oxygen                        desaturation. Successful completion of the procedure                        was aided by withdrawing and reinserting the scope. The                        patient tolerated the procedure well. 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 The gastrojejunal anastomosis       was characterized by congestion, erythema, friable mucosa, ulceration,       an intact staple line and visible sutures. A  large clean based ulcer       measuring 10-77m is identified at the gastrojejunal anastomosis. The       anastomosis was traversed. Normal mucosa of the jejunal limb.      The gastric pouch at the anastomosis was leading into remaining portion       of the body of the stomach via hiatus.      Multiple dispersed, 5 mm bleeding erosions were found in the gastric       antrum and in the prepyloric region of the stomach. There were stigmata       of recent bleeding. Fulguration to stop the bleeding by bipolar probe       was successful. Biopsies were taken with a cold forceps for Helicobacter       pylori testing.      The duodenal bulb and second portion of the duodenum were normal.       Biopsies for histology were taken with a cold forceps for evaluation of       celiac disease.      The gastroesophageal junction and examined esophagus were normal. 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                       - Large clean based gastrojejunal anastomotic ulcer                       - Normal duodenal bulb and second portion of the                        duodenum. Biopsied.                       -  Bleeding erosive gastropathy. Treated with bipolar                        cautery. Biopsied.                       - Normal gastroesophageal junction and esophagus. Recommendation:       - Discharge patient to home (with spouse).                       - Resume previous diet today.                       - Continue present medications.                       - Await pathology results.                       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 from intermittent                        bleeding from gastric erosions and anastomotic ulcer                       - PPI 35m BID long term                        - Quit smoking to help healing of the ulcer                       - IV iron by hematology                       - Recommend UGI series to further delineate the anatomy                        of her gastric bypass                       - F/u in GI clinic Procedure Code(s):    --- Professional ---                       [id number], Esophagogastroduodenoscopy, flexible,                        transoral; with control of bleeding, any method                       43239, Esophagogastroduodenoscopy, flexible, transoral;                        with biopsy, single or multiple Diagnosis Code(s):    --- Professional ---                       Z98.0, Intestinal bypass and anastomosis status                       K31.89, Other diseases of stomach and duodenum                       K92.2, Gastrointestinal hemorrhage, unspecified  R10.13, Epigastric pain                       D50.0, Iron deficiency anemia secondary to blood loss                        (chronic) CPT copyright 2017 [employer]. All rights reserved. The codes documented in this report are preliminary and upon coder review may  be revised to meet current compliance requirements. Dr. Ulyess Mort Lin Landsman MD, MD 06/01/2018 11:34:08 AM This report has been signed electronically. Number of Addenda: 0 Note Initiated On: 06/01/2018 10:51 AM      [facility]

## 2018-06-02 ENCOUNTER — Inpatient Hospital Stay: Payer: 59 | Attending: Hematology and Oncology

## 2018-06-02 DIAGNOSIS — E538 Deficiency of other specified B group vitamins: Secondary | ICD-10-CM | POA: Diagnosis not present

## 2018-06-02 DIAGNOSIS — D508 Other iron deficiency anemias: Secondary | ICD-10-CM

## 2018-06-02 MED ORDER — CYANOCOBALAMIN 1000 MCG/ML IJ SOLN
1000.0000 ug | Freq: Once | INTRAMUSCULAR | Status: AC
  Administered 2018-06-02: 1000 ug via INTRAMUSCULAR

## 2018-06-03 ENCOUNTER — Ambulatory Visit: Payer: 59

## 2018-06-03 LAB — SURGICAL PATHOLOGY

## 2018-06-29 ENCOUNTER — Encounter: Payer: Self-pay | Admitting: Gastroenterology

## 2018-06-29 ENCOUNTER — Ambulatory Visit: Payer: 59 | Admitting: Gastroenterology

## 2018-06-29 VITALS — BP 157/96 | HR 85 | Resp 17 | Wt 134.4 lb

## 2018-06-29 DIAGNOSIS — K287 Chronic gastrojejunal ulcer without hemorrhage or perforation: Secondary | ICD-10-CM

## 2018-06-29 DIAGNOSIS — K279 Peptic ulcer, site unspecified, unspecified as acute or chronic, without hemorrhage or perforation: Secondary | ICD-10-CM

## 2018-06-29 NOTE — Progress Notes (Signed)
Brenda Repress, MD [address]  [address]  [place], [place] [address]  Main: [phone]  Fax: [phone]    Gastroenterology Consultation  Referring Provider:     Excell Seltzer, MD Primary Care Physician:  Excell Seltzer, MD Primary Gastroenterologist:  Dr. Arlyss Hawkins Reason for Consultation:  chronic Iron deficiency, epigastric pain        HPI:   Brenda Hawkins is a 52 y.o. female referred by Dr. Excell Seltzer, MD  for consultation & management of chronic iron deficiency and to discuss about screening colonoscopy. She is history of gastric bypass surgery approximately 25 years ago, with chronic iron deficiency and B12 deficiency. She has history of chronic iron deficiency, was initially anemic. She has been receiving parenteral iron and intramuscular B-12 injections regularly. Her anemia has resolved. However, she continues to be iron deficient. She reports approximately two-month history of epigastric discomfort without any other associated symptoms. Her weight has been stable. She smokes cigarettes almost pack a day. Dr. Merlene Pulling has been trying to convince her to undergo screening colonoscopy and also for workup of unexplained iron deficiency despite receiving parenteral iron therapy regularly. Patient has been reluctant and is not willing to undergo colonoscopy. She reports having one episode of constipation which has resolved. She denies rectal bleeding, change in stool caliber or altered bowel habits. She is here today to discuss only about upper endoscopy because of ongoing epigastric discomfort She denies alcohol use  Follow-up visit 06/29/2018 Patient underwent upper endoscopy which revealed bleeding antral erosion which was treated with cautery.  She did have large clean-based gastrojejunal anastomotic ulcer.  Patient finished 1 month course of Nexium 40 mg twice daily and she reports that her epigastric pain has significantly improved.  She denies any other GI  complaints today.  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 - Large clean based gastrojejunal anastomotic ulcer - Normal duodenal bulb and second portion of the duodenum. Biopsied. - Bleeding erosive gastropathy. Treated with bipolar cautery. Biopsied. - Normal gastroesophageal junction and esophagus.  DIAGNOSIS:  A. DUODENUM; COLD BIOPSY:  - DUODENAL MUCOSA WITH INTACT VILLI.  - NEGATIVE FOR ACTIVE INFLAMMATION, INTRAEPITHELIAL LYMPHOCYTOSIS, AND  INFECTIOUS AGENTS.   B. 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  - NEGATIVE FOR H. PYLORI, INTESTINAL METAPLASIA, DYSPLASIA, AND  MALIGNANCY.   She denies family history of GI malignancy  Past Medical History:  Diagnosis Date  . Anemia   . Anxiety   . Asthma   . Diabetes mellitus type II   . GERD (gastroesophageal reflux disease)   . Hypertension   . Obesity   . Osteopenia   . Sleep apnea   . 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 Vitamin B12 deficiency    since bariatric surgery    Past Surgical History:  Procedure Laterality Date  . ESOPHAGOGASTRODUODENOSCOPY (EGD) WITH PROPOFOL N/A 06/01/2018   Procedure: ESOPHAGOGASTRODUODENOSCOPY (EGD) WITH PROPOFOL;  Surgeon: Toney Reil, MD;  Location: [facility] ENDOSCOPY;  Service: Gastroenterology;  Laterality: N/A;  . 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 REDUCTION MAMMAPLASTY Right 1992    Current Outpatient Medications:  .  esomeprazole (NEXIUM) 40 MG packet, Take 40 mg by mouth 2 (two) times daily before a meal. (Patient not taking: Reported on  06/29/2018), Disp: 180 each, Rfl: 0 .  loratadine (CLARITIN) 10 MG tablet, Take 10 mg by mouth daily., Disp: , Rfl:  No current facility-administered  medications for this visit.   Facility-Administered Medications Ordered in Other Visits:  .  sodium chloride 0.9 % injection 10 mL, 10 mL, Intracatheter, PRN, Orlie Dakin, Tollie Pizza, MD    Family History  Problem Relation Age of Onset  . Heart disease Mother        valve replacement surgery  . Coronary artery disease Father   . Breast cancer Neg Hx      Social History   Tobacco Use  . Smoking status: Current Every Day Smoker  . Smokeless tobacco: Never Used  . Tobacco comment: Quit 1998  Substance Use Topics  . Alcohol use: No  . Drug use: No    Allergies as of 06/29/2018 - Review Complete 06/29/2018  Allergen Reaction Noted  . Codeine sulfate Other (See Comments)   . Penicillins Other (See Comments) 06/03/2007    Review of Systems:    All systems reviewed and negative except where noted in HPI.   Physical Exam:  BP (!) 157/96 (BP Location: Left Arm, Patient Position: Sitting, Cuff Size: Normal)   Pulse 85   Resp 17   Wt 134 lb 6.4 oz (61 kg)   BMI 26.25 kg/m  No LMP recorded. Patient is postmenopausal.  General:   Alert,  Well-developed, well-nourished, pleasant and cooperative in NAD Head:  Normocephalic and atraumatic. Eyes:  Sclera clear, no icterus.   Conjunctiva pink. Ears:  Normal auditory acuity. Nose:  No deformity, discharge, or lesions. Mouth:  No deformity or lesions,oropharynx pink & moist. Neck:  Supple; no masses or thyromegaly. Lungs:  Respirations even and unlabored.  Clear throughout to auscultation.   No wheezes, crackles, or rhonchi. No acute distress. Heart:  Regular rate and rhythm; no murmurs, clicks, rubs, or gallops. Abdomen:  Normal bowel sounds. Soft, non-tender and non-distended without masses, hepatosplenomegaly or hernias noted.  No guarding or rebound tenderness.   Rectal: Not performed Msk:  Symmetrical without gross deformities. Good, equal movement & strength bilaterally. Pulses:  Normal pulses noted. Extremities:  No clubbing or  edema.  No cyanosis. Neurologic:  Alert and oriented x3;  grossly normal neurologically. Skin:  Intact without significant lesions or rashes. No jaundice. Lymph Nodes:  No significant cervical adenopathy. Psych:  Alert and cooperative. Normal mood and affect.  Imaging Studies: No abdominal imaging  Assessment and Plan:   Retal Tonkinson is a 52 y.o. caucasian female with chronic tobacco use, history of gastric bypass, chronic iron deficiency and B12 deficiency.  History of iron deficiency anemia which is currently resolved. She presents for follow-up of two-month history of epigastric discomfort.  EGD revealed antral erosions and gastrojejunal anastomotic ulcer.  There was no evidence of H. pylori infection  Epigastric pain: Secondary to erosive gastritis and gastrojejunal anastomotic ulcer Biopsies negative for H. pylori infection Recommend to continue Nexium 40 mg twice daily for next 6 months Encouraged her to continue to stop smoking  Chronic iron deficiency: Discussed in length with patient about implications of colonoscopy, particularly to rule out malignancy. Patient said that she does not want to know if she has cancer because she feels fine. She said she will undergo colonoscopy if she notices any changes in her bowel habits She will follow-up with Dr. Merlene Pulling for monitoring of iron and B12 deficiency   Follow up in 3 months  Brenda Repress, MD

## 2018-07-01 ENCOUNTER — Other Ambulatory Visit: Payer: Self-pay | Admitting: Gastroenterology

## 2018-07-01 MED ORDER — ESOMEPRAZOLE MAGNESIUM 40 MG PO PACK: 40.0000 mg | PACK | Freq: Two times a day (BID) | ORAL | 0 refills | Status: DC

## 2018-07-07 ENCOUNTER — Inpatient Hospital Stay: Payer: 59 | Attending: Hematology and Oncology

## 2018-07-07 DIAGNOSIS — D508 Other iron deficiency anemias: Secondary | ICD-10-CM

## 2018-07-07 DIAGNOSIS — E538 Deficiency of other specified B group vitamins: Secondary | ICD-10-CM | POA: Diagnosis present

## 2018-07-07 MED ORDER — CYANOCOBALAMIN 1000 MCG/ML IJ SOLN
1000.0000 ug | Freq: Once | INTRAMUSCULAR | Status: AC
  Administered 2018-07-07: 1000 ug via INTRAMUSCULAR

## 2018-08-04 ENCOUNTER — Inpatient Hospital Stay: Payer: 59 | Attending: Hematology and Oncology

## 2018-08-04 ENCOUNTER — Inpatient Hospital Stay: Payer: 59

## 2018-08-04 DIAGNOSIS — E538 Deficiency of other specified B group vitamins: Secondary | ICD-10-CM | POA: Insufficient documentation

## 2018-08-04 DIAGNOSIS — D508 Other iron deficiency anemias: Secondary | ICD-10-CM

## 2018-08-04 DIAGNOSIS — D509 Iron deficiency anemia, unspecified: Secondary | ICD-10-CM

## 2018-08-04 LAB — FERRITIN: Ferritin: 16 ng/mL (ref 11–307)

## 2018-08-04 LAB — CBC WITH DIFFERENTIAL/PLATELET
Abs Immature Granulocytes: 0.03 10*3/uL (ref 0.00–0.07)
Basophils Absolute: 0.1 10*3/uL (ref 0.0–0.1)
Basophils Relative: 1 %
Eosinophils Absolute: 0.2 10*3/uL (ref 0.0–0.5)
Eosinophils Relative: 3 %
HCT: 45.5 % (ref 36.0–46.0)
Hemoglobin: 15.2 g/dL — ABNORMAL HIGH (ref 12.0–15.0)
Immature Granulocytes: 1 %
Lymphocytes Relative: 17 %
Lymphs Abs: 1.1 10*3/uL (ref 0.7–4.0)
MCH: 36 pg — ABNORMAL HIGH (ref 26.0–34.0)
MCHC: 33.4 g/dL (ref 30.0–36.0)
MCV: 107.8 fL — ABNORMAL HIGH (ref 80.0–100.0)
Monocytes Absolute: 0.5 10*3/uL (ref 0.1–1.0)
Monocytes Relative: 7 %
Neutro Abs: 4.5 10*3/uL (ref 1.7–7.7)
Neutrophils Relative %: 71 %
Platelets: 203 10*3/uL (ref 150–400)
RBC: 4.22 MIL/uL (ref 3.87–5.11)
RDW: 12.8 % (ref 11.5–15.5)
WBC: 6.3 10*3/uL (ref 4.0–10.5)
nRBC: 0 % (ref 0.0–0.2)

## 2018-08-04 MED ORDER — CYANOCOBALAMIN 1000 MCG/ML IJ SOLN
1000.0000 ug | Freq: Once | INTRAMUSCULAR | Status: AC
  Administered 2018-08-04: 1000 ug via INTRAMUSCULAR

## 2018-08-05 ENCOUNTER — Other Ambulatory Visit: Payer: Self-pay | Admitting: Gastroenterology

## 2018-08-17 ENCOUNTER — Inpatient Hospital Stay: Payer: 59 | Attending: Hematology and Oncology

## 2018-08-17 VITALS — BP 147/86 | HR 83 | Temp 96.3°F | Resp 18

## 2018-08-17 DIAGNOSIS — E538 Deficiency of other specified B group vitamins: Secondary | ICD-10-CM | POA: Diagnosis present

## 2018-08-17 DIAGNOSIS — D509 Iron deficiency anemia, unspecified: Secondary | ICD-10-CM | POA: Insufficient documentation

## 2018-08-17 DIAGNOSIS — D508 Other iron deficiency anemias: Secondary | ICD-10-CM

## 2018-08-17 MED ORDER — SODIUM CHLORIDE 0.9 % IV SOLN: 200.0000 mg | Freq: Once | INTRAVENOUS | Status: DC

## 2018-08-17 MED ORDER — IRON SUCROSE 20 MG/ML IV SOLN
200.0000 mg | Freq: Once | INTRAVENOUS | Status: AC
  Administered 2018-08-17: 200 mg via INTRAVENOUS
  Filled 2018-08-17: qty 10

## 2018-08-17 MED ORDER — SODIUM CHLORIDE 0.9 % IV SOLN
INTRAVENOUS | Status: DC
  Administered 2018-08-17: 14:00:00 via INTRAVENOUS
  Filled 2018-08-17: qty 250

## 2018-08-24 ENCOUNTER — Inpatient Hospital Stay: Payer: 59

## 2018-08-24 ENCOUNTER — Other Ambulatory Visit: Payer: Self-pay | Admitting: Urgent Care

## 2018-08-24 VITALS — BP 139/84 | HR 82 | Temp 97.4°F | Resp 20

## 2018-08-24 DIAGNOSIS — D509 Iron deficiency anemia, unspecified: Secondary | ICD-10-CM | POA: Diagnosis not present

## 2018-08-24 DIAGNOSIS — D508 Other iron deficiency anemias: Secondary | ICD-10-CM

## 2018-08-24 MED ORDER — IRON SUCROSE 20 MG/ML IV SOLN
200.0000 mg | Freq: Once | INTRAVENOUS | Status: AC
  Administered 2018-08-24: 200 mg via INTRAVENOUS
  Filled 2018-08-24: qty 10

## 2018-08-24 MED ORDER — SODIUM CHLORIDE 0.9 % IV SOLN
INTRAVENOUS | Status: DC
  Administered 2018-08-24: 14:00:00 via INTRAVENOUS
  Filled 2018-08-24: qty 250

## 2018-08-24 MED ORDER — SODIUM CHLORIDE 0.9 % IV SOLN: 200.0000 mg | INTRAVENOUS | Status: DC

## 2018-08-31 ENCOUNTER — Inpatient Hospital Stay: Payer: 59

## 2018-08-31 VITALS — BP 154/80 | HR 80 | Temp 95.8°F | Resp 18

## 2018-08-31 DIAGNOSIS — D509 Iron deficiency anemia, unspecified: Secondary | ICD-10-CM | POA: Diagnosis not present

## 2018-08-31 DIAGNOSIS — D508 Other iron deficiency anemias: Secondary | ICD-10-CM

## 2018-08-31 MED ORDER — SODIUM CHLORIDE 0.9 % IV SOLN
INTRAVENOUS | Status: DC
  Administered 2018-08-31: 14:00:00 via INTRAVENOUS
  Filled 2018-08-31: qty 250

## 2018-08-31 MED ORDER — IRON SUCROSE 20 MG/ML IV SOLN
200.0000 mg | Freq: Once | INTRAVENOUS | Status: AC
  Administered 2018-08-31: 200 mg via INTRAVENOUS
  Filled 2018-08-31: qty 10

## 2018-09-08 ENCOUNTER — Inpatient Hospital Stay: Payer: 59

## 2018-09-08 DIAGNOSIS — D508 Other iron deficiency anemias: Secondary | ICD-10-CM

## 2018-09-08 DIAGNOSIS — D509 Iron deficiency anemia, unspecified: Secondary | ICD-10-CM | POA: Diagnosis not present

## 2018-09-08 MED ORDER — CYANOCOBALAMIN 1000 MCG/ML IJ SOLN
1000.0000 ug | Freq: Once | INTRAMUSCULAR | Status: AC
  Administered 2018-09-08: 1000 ug via INTRAMUSCULAR

## 2018-10-06 ENCOUNTER — Inpatient Hospital Stay: Payer: 59 | Attending: Oncology

## 2018-10-06 DIAGNOSIS — D508 Other iron deficiency anemias: Secondary | ICD-10-CM

## 2018-10-06 DIAGNOSIS — E538 Deficiency of other specified B group vitamins: Secondary | ICD-10-CM | POA: Insufficient documentation

## 2018-10-06 MED ORDER — CYANOCOBALAMIN 1000 MCG/ML IJ SOLN
1000.0000 ug | Freq: Once | INTRAMUSCULAR | Status: AC
  Administered 2018-10-06: 1000 ug via INTRAMUSCULAR

## 2018-10-15 ENCOUNTER — Ambulatory Visit: Payer: 59 | Admitting: Family Medicine

## 2018-10-15 ENCOUNTER — Encounter: Payer: Self-pay | Admitting: Family Medicine

## 2018-10-15 VITALS — BP 146/86 | HR 85 | Temp 98.6°F | Ht 60.25 in

## 2018-10-15 DIAGNOSIS — L299 Pruritus, unspecified: Secondary | ICD-10-CM | POA: Diagnosis not present

## 2018-10-15 LAB — CBC WITH DIFFERENTIAL/PLATELET
Basophils Absolute: 0 10*3/uL (ref 0.0–0.1)
Basophils Relative: 0.9 % (ref 0.0–3.0)
Eosinophils Absolute: 0.3 10*3/uL (ref 0.0–0.7)
Eosinophils Relative: 5.2 % — ABNORMAL HIGH (ref 0.0–5.0)
HCT: 45.7 % (ref 36.0–46.0)
HEMOGLOBIN: 15.9 g/dL — AB (ref 12.0–15.0)
Lymphocytes Relative: 14.2 % (ref 12.0–46.0)
Lymphs Abs: 0.7 10*3/uL (ref 0.7–4.0)
MCHC: 34.8 g/dL (ref 30.0–36.0)
MCV: 109.5 fl — ABNORMAL HIGH (ref 78.0–100.0)
Monocytes Absolute: 0.4 10*3/uL (ref 0.1–1.0)
Monocytes Relative: 8.3 % (ref 3.0–12.0)
NEUTROS PCT: 71.4 % (ref 43.0–77.0)
Neutro Abs: 3.7 10*3/uL (ref 1.4–7.7)
Platelets: 207 10*3/uL (ref 150.0–400.0)
RBC: 4.18 Mil/uL (ref 3.87–5.11)
RDW: 13.4 % (ref 11.5–15.5)
WBC: 5.2 10*3/uL (ref 4.0–10.5)

## 2018-10-15 LAB — HEPATIC FUNCTION PANEL
ALT: 14 U/L (ref 0–35)
AST: 15 U/L (ref 0–37)
Albumin: 4 g/dL (ref 3.5–5.2)
Alkaline Phosphatase: 70 U/L (ref 39–117)
BILIRUBIN TOTAL: 0.2 mg/dL (ref 0.2–1.2)
Bilirubin, Direct: 0 mg/dL (ref 0.0–0.3)
Total Protein: 7.1 g/dL (ref 6.0–8.3)

## 2018-10-15 MED ORDER — PERMETHRIN 5 % EX CREA: 1.0000 "application " | TOPICAL_CREAM | Freq: Once | CUTANEOUS | 0 refills | Status: AC

## 2018-10-15 MED ORDER — HYDROXYZINE HCL 10 MG PO TABS: 10.0000 mg | ORAL_TABLET | Freq: Three times a day (TID) | ORAL | 0 refills | Status: DC | PRN

## 2018-10-15 NOTE — Assessment & Plan Note (Signed)
Whole body pruritis w/o rash - for almost a week  No new exposures except new couch (husband also itching slightly) She had recent iron infusion Disc avoiding heat/colors/fragrances and watch for hives or rash  Check LFT today In absence for other cause- will empirically tx for scabies with permetherin cream and update  Hydroxyzine given for itch with caution of sedation  Update if not starting to improve in a week or if worsening   Meds ordered this encounter  Medications  . permethrin (ELIMITE) 5 % cream    Sig: Apply 1 application topically once for 1 dose. Apply from neck down then leave on 8-14 hours then wash off    Dispense:  60 g    Refill:  0  . hydrOXYzine (ATARAX/VISTARIL) 10 MG tablet    Sig: Take 1 tablet (10 mg total) by mouth 3 (three) times daily as needed for itching. Caution of sedation    Dispense:  30 tablet    Refill:  0

## 2018-10-15 NOTE — Progress Notes (Signed)
Subjective:    Patient ID: Brenda Hawkins, female    DOB: November 16, 1965, 53 y.o.   MRN: [id number]  HPI  Here for itching   53 yo pt of Dr Ermalene Searing   Symptoms started about a week ago  Bottom of feet and palms itched  Then fingers and between fingers and toes  Now moving to extremities and back and abdomen   Neck and face and scalp are ok  R ear itches a little   No obvious rash- but has scratch marks   Hx of allergic rxn to bee stings and flea bites  Also pcn   Took 2 benadryl last night   Exposures- got a new couch mid jan  Is new (not used)   No travel  No visitors  No pets   No new foods   She had some perfume - several years old- decided to wear again  Soap- suave body wash No moisturizer  Uses some cort 10   ? If hands feel a little puffy No swelling in eyes or mouth or tongue No sob or wheeze  Husband has itched a bit -not as much   Patient Active Problem List   Diagnosis Date Noted  . Itching 10/15/2018  . Epigastric pain   . Erythrocytosis 11/04/2017  . Breast asymmetry 11/04/2017  . Macrocytosis 11/04/2017  . B12 deficiency 08/28/2016  . Salicylate poisoning 07/08/2016  . Peripheral edema 03/24/2014  . HYPERCHOLESTEROLEMIA 08/11/2008  . Anemia, iron deficiency 08/11/2008  . Diabetes mellitus type II, controlled (HCC) 06/02/2007  . Osteopenia 06/02/2007  . OBESITY 12/19/2006  . ANEMIA, VITAMIN B12 DEFICIENCY 12/19/2006  . ANXIETY 12/19/2006  . 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 Essential hypertension, benign 12/19/2006  . ASTHMA 12/19/2006  . GERD 12/19/2006  . SLEEP APNEA 12/19/2006   Past Medical History:  Diagnosis Date  . Anemia   . Anxiety   . Asthma   . Diabetes mellitus type II   . GERD (gastroesophageal reflux disease)   . Hypertension   . Obesity   . Osteopenia   . Sleep apnea   . 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 Vitamin B12 deficiency    since bariatric surgery   Past Surgical History:  Procedure Laterality Date  .  ESOPHAGOGASTRODUODENOSCOPY (EGD) WITH PROPOFOL N/A 06/01/2018   Procedure: ESOPHAGOGASTRODUODENOSCOPY (EGD) WITH PROPOFOL;  Surgeon: Toney Reil, MD;  Location: [facility] ENDOSCOPY;  Service: Gastroenterology;  Laterality: N/A;  . 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 REDUCTION MAMMAPLASTY Right 1992   Social History   Tobacco Use  . Smoking status: Current Every Day Smoker    Packs/day: 1.00  . Smokeless tobacco: Never Used  Substance Use Topics  . Alcohol use: No  . Drug use: No   Family History  Problem Relation Age of Onset  . Heart disease Mother        valve replacement surgery  . Coronary artery disease Father   . Breast cancer Neg Hx    Allergies  Allergen Reactions  . Codeine Sulfate Other (See Comments)    Reaction: "violently ill"  . Penicillins Other (See Comments)    Reaction: Unsure   Has patient had a PCN reaction causing immediate rash, facial/tongue/throat swelling, SOB or lightheadedness with hypotension: No Has patient had a PCN reaction causing severe rash involving mucus membranes or skin necrosis: No Has patient had a PCN reaction that required hospitalization No Has patient had a PCN reaction occurring within the last 10 years: No If  all of the above answers are "NO", then may proceed with Cephalosporin use.    Current Outpatient Medications on File Prior to Visit  Medication Sig Dispense Refill  . loratadine (CLARITIN) 10 MG tablet Take 10 mg by mouth daily.     Current Facility-Administered Medications on File Prior to Visit  Medication Dose Route Frequency Provider Last Rate Last Dose  . sodium chloride 0.9 % injection 10 mL  10 mL Intracatheter PRN Jeralyn Ruths, MD         Review of Systems  Constitutional: Negative for activity change, appetite change, fatigue, fever and unexpected weight change.  HENT: Negative for congestion, ear pain, facial swelling, rhinorrhea, sinus pressure and sore throat.   Eyes: Negative for pain, redness and visual  disturbance.  Respiratory: Negative for cough, shortness of breath and wheezing.   Cardiovascular: Negative for chest pain and palpitations.  Gastrointestinal: Negative for abdominal pain, blood in stool, constipation and diarrhea.  Endocrine: Negative for polydipsia and polyuria.  Genitourinary: Negative for dysuria, frequency and urgency.  Musculoskeletal: Negative for arthralgias, back pain and myalgias.  Skin: Negative for pallor and rash.       Mod to severe itching w/o rash Some bruising from scratching   Allergic/Immunologic: Negative for environmental allergies.  Neurological: Negative for dizziness, syncope and headaches.  Hematological: Negative for adenopathy. Does not bruise/bleed easily.  Psychiatric/Behavioral: Negative for decreased concentration and dysphoric mood. The patient is not nervous/anxious.        Objective:   Physical Exam Constitutional:      General: She is not in acute distress.    Appearance: Normal appearance. She is normal weight. She is not ill-appearing.  HENT:     Head: Normocephalic and atraumatic.     Comments: No facial swelling     Right Ear: Tympanic membrane and ear canal normal.     Left Ear: Tympanic membrane and ear canal normal.     Nose: Nose normal.     Mouth/Throat:     Mouth: Mucous membranes are moist.     Pharynx: Oropharynx is clear.     Comments: No mouth or tongue swelling  Eyes:     General: No scleral icterus.       Right eye: No discharge.        Left eye: No discharge.     Conjunctiva/sclera: Conjunctivae normal.     Pupils: Pupils are equal, round, and reactive to light.  Neck:     Musculoskeletal: Normal range of motion. No muscular tenderness.  Cardiovascular:     Rate and Rhythm: Normal rate and regular rhythm.     Pulses: Normal pulses.     Heart sounds: Normal heart sounds.  Pulmonary:     Effort: Pulmonary effort is normal. No respiratory distress.     Breath sounds: Normal breath sounds. No stridor. No  wheezing.  Abdominal:     General: Abdomen is flat. Bowel sounds are normal. There is no distension.     Palpations: Abdomen is soft. There is no mass.     Tenderness: There is no abdominal tenderness.     Comments: No HSM   Lymphadenopathy:     Cervical: No cervical adenopathy.  Skin:    General: Skin is warm and dry.     Capillary Refill: Capillary refill takes less than 2 seconds.     Findings: No rash.     Comments: Pt is scratching all over- uncomfortable  No rash  No whelps  Some  areas of ecchymosis from recent scratching as well as excoriations  Few areas of dry skin w/o scale   Neurological:     Mental Status: She is alert.     Sensory: No sensory deficit.     Motor: No weakness.     Gait: Gait normal.  Psychiatric:        Mood and Affect: Mood normal.           Assessment & Plan:   Problem List Items Addressed This Visit      Other   Itching - Primary    Whole body pruritis w/o rash - for almost a week  No new exposures except new couch (husband also itching slightly) She had recent iron infusion Disc avoiding heat/colors/fragrances and watch for hives or rash  Check LFT today In absence for other cause- will empirically tx for scabies with permetherin cream and update  Hydroxyzine given for itch with caution of sedation  Update if not starting to improve in a week or if worsening   Meds ordered this encounter  Medications  . permethrin (ELIMITE) 5 % cream    Sig: Apply 1 application topically once for 1 dose. Apply from neck down then leave on 8-14 hours then wash off    Dispense:  60 g    Refill:  0  . hydrOXYzine (ATARAX/VISTARIL) 10 MG tablet    Sig: Take 1 tablet (10 mg total) by mouth 3 (three) times daily as needed for itching. Caution of sedation    Dispense:  30 tablet    Refill:  0         Relevant Orders   CBC with Differential/Platelet (Completed)   Hepatic function panel (Completed)

## 2018-10-15 NOTE — Patient Instructions (Signed)
Keep cool  Avoid fragrances  Use dove soap for sensitive skin    Hydroxyzine with caution of sedation for itch  Use the cream once as directed in case of scabies  Labs today- checking liver   Let us know if no improvement

## 2018-10-21 ENCOUNTER — Encounter: Payer: Self-pay | Admitting: Family Medicine

## 2018-10-21 ENCOUNTER — Ambulatory Visit: Payer: 59 | Admitting: Family Medicine

## 2018-10-21 VITALS — BP 128/86 | HR 78 | Temp 98.3°F | Ht 60.25 in | Wt 149.0 lb

## 2018-10-21 DIAGNOSIS — R300 Dysuria: Secondary | ICD-10-CM

## 2018-10-21 DIAGNOSIS — N12 Tubulo-interstitial nephritis, not specified as acute or chronic: Secondary | ICD-10-CM | POA: Diagnosis not present

## 2018-10-21 LAB — POCT URINALYSIS DIPSTICK
Bilirubin, UA: NEGATIVE
Glucose, UA: NEGATIVE
Ketones, UA: NEGATIVE
Nitrite, UA: NEGATIVE
Protein, UA: POSITIVE — AB
Spec Grav, UA: 1.01 (ref 1.010–1.025)
Urobilinogen, UA: 0.2 E.U./dL
pH, UA: 6.5 (ref 5.0–8.0)

## 2018-10-21 MED ORDER — CIPROFLOXACIN HCL 500 MG PO TABS: 500.0000 mg | ORAL_TABLET | Freq: Two times a day (BID) | ORAL | 0 refills | Status: DC

## 2018-10-21 NOTE — Patient Instructions (Signed)
Take the antibiotic If not improving in the next 24 hours return to clinic. If worsening go to the ER

## 2018-10-21 NOTE — Progress Notes (Signed)
Subjective:     Brenda Hawkins is a 53 y.o. female presenting for Urinary Urgency (Blood in urine started on 10/19/2018 and back pain a week ago, also has a little urine out put, pelvic pressure, nausea, slight itching.)     HPI   #urinary urgency - blood in urine - started 10/19/2018 - back pain 1 week ago - poor urine output - pelvic pressure - nausea - some itching - on the palms of hands and bottoms of feet (seen last week) - no vaginal discharge -   #Back pain - x 1 week - no known injury - hx of low back pain - thinks this may be related to the kidney infection - seems to change location - - today is flank located - can be in the middle or the side - constant pain - no radiating symptoms   Review of Systems  Constitutional: Negative for chills and fever.  Gastrointestinal: Positive for abdominal pain and nausea. Negative for constipation, diarrhea and vomiting.     Social History   Tobacco Use  Smoking Status Current Every Day Smoker  . Packs/day: 1.00  Smokeless Tobacco Never Used        Objective:    BP Readings from Last 3 Encounters:  10/21/18 128/86  10/15/18 (!) 146/86  08/31/18 (!) 154/80   Wt Readings from Last 3 Encounters:  10/21/18 149 lb (67.6 kg)  06/29/18 134 lb 6.4 oz (61 kg)  06/01/18 125 lb (56.7 kg)    BP 128/86   Pulse 78   Temp 98.3 F (36.8 C)   Ht 5' 0.25" (1.53 m)   Wt 149 lb (67.6 kg)   SpO2 97%   BMI 28.86 kg/m    Physical Exam Constitutional:      General: She is not in acute distress.    Appearance: She is well-developed. She is not diaphoretic.  HENT:     Head: Normocephalic and atraumatic.  Eyes:     Conjunctiva/sclera: Conjunctivae normal.  Neck:     Musculoskeletal: Neck supple.  Cardiovascular:     Rate and Rhythm: Normal rate and regular rhythm.     Heart sounds: Normal heart sounds.  Pulmonary:     Effort: Pulmonary effort is normal.  Abdominal:     General: Bowel sounds are normal.  There is no distension.     Palpations: Abdomen is soft.     Tenderness: There is no abdominal tenderness. There is right CVA tenderness. There is no left CVA tenderness or guarding.  Musculoskeletal:     Cervical back: She exhibits no bony tenderness.     Thoracic back: She exhibits no bony tenderness.     Lumbar back: She exhibits no bony tenderness.  Skin:    General: Skin is warm and dry.  Neurological:     Mental Status: She is alert.     UA: + RBC, +LE, + protein      Assessment & Plan:   Problem List Items Addressed This Visit    None    Visit Diagnoses    Pyelonephritis    -  Primary   Relevant Medications   ciprofloxacin (CIPRO) 500 MG tablet   Other Relevant Orders   Urine Culture   Dysuria       Relevant Orders   POCT urinalysis dipstick (Completed)   Urine Culture     Given symptoms concerning for pyelonephritis will treat as complicated UTI with cipro. Offered cipro or IM injection + Bactrim  and patient chose cipro.   Strict return precautions provided if worsening or not improving  Urine culture to follow-up and ensure sensitivity.   VS stable so feel appropriate for outpatient management at this time  Return if symptoms worsen or fail to improve.  Lynnda Child, MD

## 2018-10-22 LAB — URINE CULTURE
MICRO NUMBER: 186002
SPECIMEN QUALITY:: ADEQUATE

## 2018-10-23 ENCOUNTER — Encounter: Payer: Self-pay | Admitting: Emergency Medicine

## 2018-10-23 ENCOUNTER — Ambulatory Visit
Admission: RE | Admit: 2018-10-23 | Discharge: 2018-10-23 | Disposition: A | Discharge status: Home / Self Care | Payer: 59 | Source: Ambulatory Visit | Attending: Family Medicine | Admitting: Family Medicine

## 2018-10-23 ENCOUNTER — Other Ambulatory Visit: Payer: Self-pay | Admitting: Family Medicine

## 2018-10-23 ENCOUNTER — Other Ambulatory Visit: Payer: Self-pay

## 2018-10-23 ENCOUNTER — Observation Stay
Admission: EM | Admit: 2018-10-23 | Discharge: 2018-10-25 | Disposition: A | Discharge status: Home / Self Care | Payer: 59 | Attending: Internal Medicine | Admitting: Internal Medicine

## 2018-10-23 DIAGNOSIS — N201 Calculus of ureter: Secondary | ICD-10-CM | POA: Diagnosis not present

## 2018-10-23 DIAGNOSIS — Z9884 Bariatric surgery status: Secondary | ICD-10-CM | POA: Diagnosis not present

## 2018-10-23 DIAGNOSIS — N200 Calculus of kidney: Secondary | ICD-10-CM | POA: Diagnosis not present

## 2018-10-23 DIAGNOSIS — K219 Gastro-esophageal reflux disease without esophagitis: Secondary | ICD-10-CM | POA: Insufficient documentation

## 2018-10-23 DIAGNOSIS — R109 Unspecified abdominal pain: Secondary | ICD-10-CM | POA: Insufficient documentation

## 2018-10-23 DIAGNOSIS — I10 Essential (primary) hypertension: Secondary | ICD-10-CM | POA: Insufficient documentation

## 2018-10-23 DIAGNOSIS — G4730 Sleep apnea, unspecified: Secondary | ICD-10-CM | POA: Insufficient documentation

## 2018-10-23 DIAGNOSIS — N135 Crossing vessel and stricture of ureter without hydronephrosis: Secondary | ICD-10-CM | POA: Diagnosis not present

## 2018-10-23 DIAGNOSIS — E876 Hypokalemia: Secondary | ICD-10-CM | POA: Insufficient documentation

## 2018-10-23 DIAGNOSIS — R3129 Other microscopic hematuria: Secondary | ICD-10-CM

## 2018-10-23 DIAGNOSIS — F17210 Nicotine dependence, cigarettes, uncomplicated: Secondary | ICD-10-CM | POA: Insufficient documentation

## 2018-10-23 DIAGNOSIS — N1330 Unspecified hydronephrosis: Secondary | ICD-10-CM | POA: Diagnosis not present

## 2018-10-23 DIAGNOSIS — N12 Tubulo-interstitial nephritis, not specified as acute or chronic: Secondary | ICD-10-CM

## 2018-10-23 DIAGNOSIS — N132 Hydronephrosis with renal and ureteral calculous obstruction: Secondary | ICD-10-CM | POA: Diagnosis not present

## 2018-10-23 DIAGNOSIS — E119 Type 2 diabetes mellitus without complications: Secondary | ICD-10-CM | POA: Insufficient documentation

## 2018-10-23 LAB — BASIC METABOLIC PANEL
Anion gap: 7 (ref 5–15)
BUN: 22 mg/dL — ABNORMAL HIGH (ref 6–20)
CALCIUM: 9 mg/dL (ref 8.9–10.3)
CO2: 25 mmol/L (ref 22–32)
Chloride: 110 mmol/L (ref 98–111)
Creatinine, Ser: 0.83 mg/dL (ref 0.44–1.00)
GFR calc Af Amer: >60 mL/min (ref >60)
Glucose, Bld: 132 mg/dL — ABNORMAL HIGH (ref 70–99)
Potassium: 3.3 mmol/L — ABNORMAL LOW (ref 3.5–5.1)
Sodium: 142 mmol/L (ref 135–145)

## 2018-10-23 LAB — CBC WITH DIFFERENTIAL/PLATELET
Abs Immature Granulocytes: 0.04 10*3/uL (ref 0.00–0.07)
Basophils Absolute: 0.1 10*3/uL (ref 0.0–0.1)
Basophils Relative: 1 %
EOS PCT: 3 %
Eosinophils Absolute: 0.2 10*3/uL (ref 0.0–0.5)
HCT: 45.4 % (ref 36.0–46.0)
Hemoglobin: 15.4 g/dL — ABNORMAL HIGH (ref 12.0–15.0)
Immature Granulocytes: 1 %
Lymphocytes Relative: 16 %
Lymphs Abs: 1.1 10*3/uL (ref 0.7–4.0)
MCH: 37.4 pg — AB (ref 26.0–34.0)
MCHC: 33.9 g/dL (ref 30.0–36.0)
MCV: 110.2 fL — ABNORMAL HIGH (ref 80.0–100.0)
MONO ABS: 0.6 10*3/uL (ref 0.1–1.0)
MONOS PCT: 9 %
Neutro Abs: 4.9 10*3/uL (ref 1.7–7.7)
Neutrophils Relative %: 70 %
Platelets: 191 10*3/uL (ref 150–400)
RBC: 4.12 MIL/uL (ref 3.87–5.11)
RDW: 12.3 % (ref 11.5–15.5)
WBC: 6.9 10*3/uL (ref 4.0–10.5)
nRBC: 0 % (ref 0.0–0.2)

## 2018-10-23 LAB — URINALYSIS, COMPLETE (UACMP) WITH MICROSCOPIC
Bilirubin Urine: NEGATIVE
Glucose, UA: NEGATIVE mg/dL
KETONES UR: NEGATIVE mg/dL
Nitrite: NEGATIVE
Protein, ur: NEGATIVE mg/dL
Specific Gravity, Urine: 1.015 (ref 1.005–1.030)
pH: 5 (ref 5.0–8.0)

## 2018-10-23 MED ORDER — KETOROLAC TROMETHAMINE 30 MG/ML IJ SOLN
15.0000 mg | INTRAMUSCULAR | Status: AC
  Administered 2018-10-23: 15 mg via INTRAVENOUS
  Filled 2018-10-23: qty 1

## 2018-10-23 MED ORDER — SODIUM CHLORIDE 0.9 % IV BOLUS
1000.0000 mL | Freq: Once | INTRAVENOUS | Status: AC
  Administered 2018-10-23: 1000 mL via INTRAVENOUS

## 2018-10-23 MED ORDER — ONDANSETRON HCL 4 MG/2ML IJ SOLN
4.0000 mg | Freq: Once | INTRAMUSCULAR | Status: AC
  Administered 2018-10-23: 4 mg via INTRAVENOUS
  Filled 2018-10-23: qty 2

## 2018-10-23 MED ORDER — HYDROMORPHONE HCL 1 MG/ML IJ SOLN
1.0000 mg | Freq: Once | INTRAMUSCULAR | Status: AC
  Administered 2018-10-23: 1 mg via INTRAVENOUS
  Filled 2018-10-23: qty 1

## 2018-10-23 MED ORDER — FENTANYL CITRATE (PF) 100 MCG/2ML IJ SOLN
50.0000 ug | INTRAMUSCULAR | Status: DC | PRN
  Administered 2018-10-23: 50 ug via INTRAVENOUS
  Filled 2018-10-23: qty 2

## 2018-10-23 NOTE — Consult Note (Signed)
Chart reviewed  B/l ureteral calculi w/ hydronephrosis and persistent pain. Cr WNL, no leukocytosis. AFVSS.  Given continued pain as well as b/l ureteral obstruction placing her at risk of AKI, she will need interventon  She has been posted for OR tomorrow for cystoscopy, bilateral ureteroscopy, laser lithotripsy and stent placement  Please make NPO at [facility]. IVF, strain all urine, pain control, antiemetics  Consult to follow

## 2018-10-23 NOTE — ED Provider Notes (Signed)
[facility] Emergency Department Provider Note  ____________________________________________  Time seen: Approximately 11:00 PM  I have reviewed the triage vital signs and the nursing notes.   HISTORY  Chief Complaint Flank Pain    HPI Rachna Epperson is a 53 y.o. female with a history of diabetes hypertension who comes the ED complaining of bilateral flank pain, worse on the right, ongoing for the past 3 days.  Saw primary care a few days ago, started on Cipro for what appeared to be a urinary tract infection.  With worsening symptoms, an outpatient CT was performed today which showed a 10 mm stone on the right ureteropelvic junction with hydronephrosis and 2 small distal stones on the left ureter.      Past Medical History:  Diagnosis Date  . Anemia   . Anxiety   . Asthma   . Diabetes mellitus type II   . GERD (gastroesophageal reflux disease)   . Hypertension   . Obesity   . Osteopenia   . Sleep apnea   . 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 Vitamin B12 deficiency    since bariatric surgery     Patient Active Problem List   Diagnosis Date Noted  . Itching 10/15/2018  . Epigastric pain   . Erythrocytosis 11/04/2017  . Breast asymmetry 11/04/2017  . Macrocytosis 11/04/2017  . B12 deficiency 08/28/2016  . Salicylate poisoning 07/08/2016  . Peripheral edema 03/24/2014  . HYPERCHOLESTEROLEMIA 08/11/2008  . Anemia, iron deficiency 08/11/2008  . Diabetes mellitus type II, controlled (HCC) 06/02/2007  . Osteopenia 06/02/2007  . OBESITY 12/19/2006  . ANEMIA, VITAMIN B12 DEFICIENCY 12/19/2006  . ANXIETY 12/19/2006  . 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 Essential hypertension, benign 12/19/2006  . ASTHMA 12/19/2006  . GERD 12/19/2006  . SLEEP APNEA 12/19/2006     Past Surgical History:  Procedure Laterality Date  . ESOPHAGOGASTRODUODENOSCOPY (EGD) WITH PROPOFOL N/A 06/01/2018   Procedure: ESOPHAGOGASTRODUODENOSCOPY (EGD) WITH PROPOFOL;   Surgeon: Toney Reil, MD;  [facility]: [facility] ENDOSCOPY;  Service: Gastroenterology;  Laterality: N/A;  . 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 REDUCTION MAMMAPLASTY Right 1992     Prior to Admission medications   Medication Sig Start Date End Date Taking? Authorizing Provider  acetaminophen (TYLENOL) 500 MG tablet Take 500 mg by mouth every 6 (six) hours as needed.   Yes [provider]  ciprofloxacin (CIPRO) 500 MG tablet Take 1 tablet (500 mg total) by mouth 2 (two) times daily. 10/21/18  Yes Lynnda Child, MD     Allergies Codeine sulfate and Penicillins   Family History  Problem Relation Age of Onset  . Heart disease Mother        valve replacement surgery  . Coronary artery disease Father   . Breast cancer Neg Hx     Social History Social History   Tobacco Use  . Smoking status: Current Every Day Smoker    Packs/day: 1.00  . Smokeless tobacco: Never Used  Substance Use Topics  . Alcohol use: No  . Drug use: No    Review of Systems  Constitutional:   No fever or chills.  ENT:   No sore throat. No rhinorrhea. Cardiovascular:   No chest pain or syncope. Respiratory:   No dyspnea or cough. Gastrointestinal:   Negative for abdominal pain, vomiting and diarrhea.  Musculoskeletal: Positive as above bilateral flank pain All other systems reviewed and are negative except as documented above in ROS and HPI.  ____________________________________________   PHYSICAL EXAM:  VITAL SIGNS: ED Triage Vitals  Enc Vitals Group     BP 10/23/18 1722 (!) 184/89     Pulse Rate 10/23/18 1722 77     Resp 10/23/18 1722 18     Temp 10/23/18 1722 98.1 F (36.7 C)     Temp src --      SpO2 10/23/18 1722 97 %     Weight 10/23/18 1723 149 lb (67.6 kg)     Height 10/23/18 1723 5' (1.524 m)     Head Circumference --      Peak Flow --      Pain Score 10/23/18 1722 8     Pain Loc --      Pain Edu? --      Excl. in GC? --     Vital signs reviewed, nursing assessments  reviewed.   Constitutional:   Alert and oriented. Non-toxic appearance. Eyes:   Conjunctivae are normal. EOMI. PERRL. ENT      Head:   Normocephalic and atraumatic.      Nose:   No congestion/rhinnorhea.       Mouth/Throat:   MMM, no pharyngeal erythema. No peritonsillar mass.       Neck:   No meningismus. Full ROM. Hematological/Lymphatic/Immunilogical:   No cervical lymphadenopathy. Cardiovascular:   RRR. Symmetric bilateral radial and DP pulses.  No murmurs. Cap refill less than 2 seconds. Respiratory:   Normal respiratory effort without tachypnea/retractions. Breath sounds are clear and equal bilaterally. No wheezes/rales/rhonchi. Gastrointestinal:   Soft with mild right-sided abdominal tenderness. Non distended. There is no CVA tenderness.  No rebound, rigidity, or guarding. Musculoskeletal:   Normal range of motion in all extremities. No joint effusions.  No lower extremity tenderness.  No edema. Neurologic:   Normal speech and language.  Motor grossly intact. No acute focal neurologic deficits are appreciated.  Skin:    Skin is warm, dry and intact. No rash noted.  No petechiae, purpura, or bullae.  ____________________________________________    LABS (pertinent positives/negatives) (all labs ordered are listed, but only abnormal results are displayed) Labs Reviewed  URINALYSIS, COMPLETE (UACMP) WITH MICROSCOPIC - Abnormal; Notable for the following components:      Result Value   Color, Urine YELLOW (*)    APPearance CLEAR (*)    Hgb urine dipstick MODERATE (*)    Leukocytes,Ua TRACE (*)    Bacteria, UA RARE (*)    All other components within normal limits  BASIC METABOLIC PANEL - Abnormal; Notable for the following components:   Potassium 3.3 (*)    Glucose, Bld 132 (*)    BUN 22 (*)    All other components within normal limits  CBC WITH DIFFERENTIAL/PLATELET - Abnormal; Notable for the following components:   Hemoglobin 15.4 (*)    MCV 110.2 (*)    MCH 37.4 (*)     All other components within normal limits  URINE CULTURE   ____________________________________________   EKG    ____________________________________________    RADIOLOGY  Ct Abdomen Pelvis Wo Contrast  Result Date: 10/23/2018 CLINICAL DATA:  Right flank pain for 1 week. Microscopic hematuria. Stone disease suspected. EXAM: CT ABDOMEN AND PELVIS WITHOUT CONTRAST TECHNIQUE: Multidetector CT imaging of the abdomen and pelvis was performed following the standard protocol without IV contrast. COMPARISON:  None. FINDINGS: Lower chest: No acute findings. Hepatobiliary: No mass visualized on this unenhanced exam. Multiple tiny gallstones are seen, however there is no evidence of cholecystitis or biliary ductal dilatation. Pancreas: No mass or inflammatory process  visualized on this unenhanced exam. Spleen:  Within normal limits in size. Adrenals/Urinary tract: Normal adrenal glands. Several tiny less than 5 mm renal calculi are seen bilaterally. Moderate to severe right hydronephrosis is seen due to a 10 mm calculus at the right UPJ. Moderate left hydronephrosis is seen due to a 4 mm calculus in the distal left ureter and a 3 mm calculus at the left UVJ. Stomach/Bowel: Previous gastric bypass surgery noted. Diverticulosis is seen mainly involving the sigmoid colon, however there is no evidence of diverticulitis. No evidence of obstruction, inflammatory process, or abnormal fluid collections. Vascular/Lymphatic: No pathologically enlarged lymph nodes identified. No evidence of abdominal aortic aneurysm. Aortic atherosclerosis. Reproductive:  No mass or other significant abnormality. Other:  None. Musculoskeletal:  No suspicious bone lesions identified. IMPRESSION: 1. Moderate to severe right hydronephrosis due to 10 mm calculus at the right UPJ. 2. Moderate left hydronephrosis due to 4 mm calculus in distal left ureter and 3 mm calculus at left UVJ. 3. Tiny bilateral intrarenal calculi. 4.  Cholelithiasis. No radiographic evidence of cholecystitis. 5. Colonic diverticulosis, without radiographic evidence of diverticulitis. Electronically Signed   By: Myles Rosenthal M.D.   On: 10/23/2018 16:10    ____________________________________________   PROCEDURES Procedures  ____________________________________________    CLINICAL IMPRESSION / ASSESSMENT AND PLAN / ED COURSE  Pertinent labs & imaging results that were available during my care of the patient were reviewed by me and considered in my medical decision making (see chart for details).    Patient presents with bilateral ureterolithiasis with large obstructing stone on the right.  IV fluids given Toradol and Zofran given.  Dilaudid given.  Still having moderate pain and significant nausea.  Labs are unremarkable and vital signs are unremarkable.  Discussed with urology Dr. Alvester Morin who plans to do procedure tomorrow given the large obstructing stone and intractable symptoms.  No evidence of UTI at this time to necessitate more urgent intervention.      ____________________________________________   FINAL CLINICAL IMPRESSION(S) / ED DIAGNOSES    Final diagnoses:  Obstruction of right ureter  Ureterolithiasis  Flank pain     ED Discharge Orders    None      Portions of this note were generated with dragon dictation software. Dictation errors may occur despite best attempts at proofreading.   Sharman Cheek, MD 10/23/18 [phone]

## 2018-10-23 NOTE — ED Notes (Signed)
Blood draw unsuccessful with IV start.

## 2018-10-23 NOTE — ED Notes (Signed)
Pt walked with assistance with help from EDT. Pt complained of being dizzy and nauseous. Pt performed self peri care and is now back in bed with no other complaints. ED RN notified!

## 2018-10-23 NOTE — ED Notes (Signed)
Pt provided with something to eat and drink. Informed that she will remain NPO after midnight.

## 2018-10-23 NOTE — ED Triage Notes (Signed)
PT to ER states diagnosed with bilateral kidney stones, states having bilateral flank pain, n/v/

## 2018-10-24 ENCOUNTER — Encounter
Admission: EM | Disposition: A | Discharge status: Home / Self Care | Payer: Self-pay | Source: Home / Self Care | Attending: Emergency Medicine

## 2018-10-24 ENCOUNTER — Encounter: Payer: Self-pay | Admitting: Internal Medicine

## 2018-10-24 ENCOUNTER — Observation Stay: Payer: 59 | Admitting: Certified Registered"

## 2018-10-24 DIAGNOSIS — N201 Calculus of ureter: Secondary | ICD-10-CM | POA: Diagnosis not present

## 2018-10-24 DIAGNOSIS — I10 Essential (primary) hypertension: Secondary | ICD-10-CM | POA: Diagnosis not present

## 2018-10-24 DIAGNOSIS — E876 Hypokalemia: Secondary | ICD-10-CM | POA: Diagnosis not present

## 2018-10-24 DIAGNOSIS — N200 Calculus of kidney: Secondary | ICD-10-CM

## 2018-10-24 DIAGNOSIS — N132 Hydronephrosis with renal and ureteral calculous obstruction: Secondary | ICD-10-CM | POA: Diagnosis not present

## 2018-10-24 DIAGNOSIS — N1330 Unspecified hydronephrosis: Secondary | ICD-10-CM | POA: Diagnosis not present

## 2018-10-24 DIAGNOSIS — E119 Type 2 diabetes mellitus without complications: Secondary | ICD-10-CM | POA: Diagnosis not present

## 2018-10-24 HISTORY — PX: URETEROSCOPY: SHX842

## 2018-10-24 HISTORY — PX: CYSTOSCOPY WITH STENT PLACEMENT: SHX5790

## 2018-10-24 HISTORY — PX: CYSTOSCOPY WITH HOLMIUM LASER LITHOTRIPSY: SHX6639

## 2018-10-24 LAB — HEPATIC FUNCTION PANEL
ALBUMIN: 3.6 g/dL (ref 3.5–5.0)
ALT: 10 U/L (ref 0–44)
AST: 20 U/L (ref 15–41)
Alkaline Phosphatase: 51 U/L (ref 38–126)
Bilirubin, Direct: <0.1 mg/dL (ref 0.0–0.2)
TOTAL PROTEIN: 6.7 g/dL (ref 6.5–8.1)
Total Bilirubin: 0.4 mg/dL (ref 0.3–1.2)

## 2018-10-24 LAB — PHOSPHORUS: Phosphorus: 4.1 mg/dL (ref 2.5–4.6)

## 2018-10-24 LAB — MAGNESIUM: Magnesium: 1.9 mg/dL (ref 1.7–2.4)

## 2018-10-24 SURGERY — URETEROSCOPY
Anesthesia: General | Laterality: Left

## 2018-10-24 MED ORDER — LIDOCAINE HCL (PF) 2 % IJ SOLN
INTRAMUSCULAR | Status: AC
  Filled 2018-10-24: qty 10

## 2018-10-24 MED ORDER — SUCCINYLCHOLINE CHLORIDE 20 MG/ML IJ SOLN
INTRAMUSCULAR | Status: AC
  Filled 2018-10-24: qty 1

## 2018-10-24 MED ORDER — FENTANYL CITRATE (PF) 100 MCG/2ML IJ SOLN
INTRAMUSCULAR | Status: AC
  Filled 2018-10-24: qty 2

## 2018-10-24 MED ORDER — SUGAMMADEX SODIUM 200 MG/2ML IV SOLN
INTRAVENOUS | Status: AC
  Filled 2018-10-24: qty 2

## 2018-10-24 MED ORDER — ONDANSETRON HCL 4 MG/2ML IJ SOLN: 4.0000 mg | Freq: Four times a day (QID) | INTRAMUSCULAR | Status: DC | PRN

## 2018-10-24 MED ORDER — ROCURONIUM BROMIDE 50 MG/5ML IV SOLN
INTRAVENOUS | Status: AC
  Filled 2018-10-24: qty 1

## 2018-10-24 MED ORDER — SENNOSIDES-DOCUSATE SODIUM 8.6-50 MG PO TABS: 1.0000 | ORAL_TABLET | Freq: Every evening | ORAL | Status: DC | PRN

## 2018-10-24 MED ORDER — PROMETHAZINE HCL 25 MG/ML IJ SOLN
12.5000 mg | Freq: Once | INTRAMUSCULAR | Status: AC
  Administered 2018-10-24: 12.5 mg via INTRAVENOUS
  Filled 2018-10-24: qty 1

## 2018-10-24 MED ORDER — ONDANSETRON HCL 4 MG/2ML IJ SOLN
INTRAMUSCULAR | Status: AC
  Filled 2018-10-24: qty 2

## 2018-10-24 MED ORDER — PROPOFOL 10 MG/ML IV BOLUS
INTRAVENOUS | Status: DC | PRN
  Administered 2018-10-24: 120 mg via INTRAVENOUS

## 2018-10-24 MED ORDER — POTASSIUM CHLORIDE CRYS ER 20 MEQ PO TBCR
40.0000 meq | EXTENDED_RELEASE_TABLET | Freq: Once | ORAL | Status: AC
  Administered 2018-10-24: 40 meq via ORAL
  Filled 2018-10-24: qty 2

## 2018-10-24 MED ORDER — ONDANSETRON HCL 4 MG/2ML IJ SOLN
INTRAMUSCULAR | Status: DC | PRN
  Administered 2018-10-24: 4 mg via INTRAVENOUS

## 2018-10-24 MED ORDER — SUGAMMADEX SODIUM 200 MG/2ML IV SOLN
INTRAVENOUS | Status: DC | PRN
  Administered 2018-10-24: 150 mg via INTRAVENOUS

## 2018-10-24 MED ORDER — DEXAMETHASONE SODIUM PHOSPHATE 10 MG/ML IJ SOLN
INTRAMUSCULAR | Status: AC
  Filled 2018-10-24: qty 1

## 2018-10-24 MED ORDER — ONDANSETRON HCL 4 MG PO TABS: 4.0000 mg | ORAL_TABLET | Freq: Four times a day (QID) | ORAL | Status: DC | PRN

## 2018-10-24 MED ORDER — ONDANSETRON HCL 4 MG/2ML IJ SOLN
4.0000 mg | Freq: Once | INTRAMUSCULAR | Status: AC | PRN
  Administered 2018-10-24: 4 mg via INTRAVENOUS

## 2018-10-24 MED ORDER — FENTANYL CITRATE (PF) 100 MCG/2ML IJ SOLN
25.0000 ug | INTRAMUSCULAR | Status: DC | PRN
  Administered 2018-10-24 (×4): 25 ug via INTRAVENOUS

## 2018-10-24 MED ORDER — LIDOCAINE HCL (CARDIAC) PF 100 MG/5ML IV SOSY
PREFILLED_SYRINGE | INTRAVENOUS | Status: DC | PRN
  Administered 2018-10-24: 60 mg via INTRAVENOUS

## 2018-10-24 MED ORDER — DEXAMETHASONE SODIUM PHOSPHATE 10 MG/ML IJ SOLN
INTRAMUSCULAR | Status: DC | PRN
  Administered 2018-10-24: 10 mg via INTRAVENOUS

## 2018-10-24 MED ORDER — ACETAMINOPHEN 650 MG RE SUPP: 650.0000 mg | Freq: Four times a day (QID) | RECTAL | Status: DC | PRN

## 2018-10-24 MED ORDER — FENTANYL CITRATE (PF) 100 MCG/2ML IJ SOLN
INTRAMUSCULAR | Status: DC | PRN
  Administered 2018-10-24: 100 ug via INTRAVENOUS

## 2018-10-24 MED ORDER — LACTATED RINGERS IV SOLN
INTRAVENOUS | Status: DC
  Administered 2018-10-24 – 2018-10-25 (×2): via INTRAVENOUS

## 2018-10-24 MED ORDER — ONDANSETRON HCL 4 MG/2ML IJ SOLN
4.0000 mg | Freq: Four times a day (QID) | INTRAMUSCULAR | Status: DC | PRN
  Administered 2018-10-24 (×3): 4 mg via INTRAVENOUS
  Filled 2018-10-24 (×2): qty 2

## 2018-10-24 MED ORDER — ENOXAPARIN SODIUM 40 MG/0.4ML ~~LOC~~ SOLN
40.0000 mg | SUBCUTANEOUS | Status: DC
  Filled 2018-10-24: qty 0.4

## 2018-10-24 MED ORDER — SODIUM CHLORIDE 0.9 % IV SOLN
1.0000 g | INTRAVENOUS | Status: DC
  Administered 2018-10-24 – 2018-10-25 (×2): 1 g via INTRAVENOUS
  Filled 2018-10-24: qty 1
  Filled 2018-10-24 (×2): qty 10

## 2018-10-24 MED ORDER — BISACODYL 5 MG PO TBEC: 5.0000 mg | DELAYED_RELEASE_TABLET | Freq: Every day | ORAL | Status: DC | PRN

## 2018-10-24 MED ORDER — ROCURONIUM BROMIDE 100 MG/10ML IV SOLN
INTRAVENOUS | Status: DC | PRN
  Administered 2018-10-24: 30 mg via INTRAVENOUS

## 2018-10-24 MED ORDER — PROPOFOL 10 MG/ML IV BOLUS
INTRAVENOUS | Status: AC
  Filled 2018-10-24: qty 20

## 2018-10-24 MED ORDER — LACTATED RINGERS IV SOLN
INTRAVENOUS | Status: AC
  Administered 2018-10-24: 01:00:00 via INTRAVENOUS

## 2018-10-24 MED ORDER — SUCCINYLCHOLINE CHLORIDE 20 MG/ML IJ SOLN
INTRAMUSCULAR | Status: DC | PRN
  Administered 2018-10-24: 100 mg via INTRAVENOUS

## 2018-10-24 MED ORDER — EPHEDRINE SULFATE 50 MG/ML IJ SOLN
INTRAMUSCULAR | Status: AC
  Filled 2018-10-24: qty 1

## 2018-10-24 MED ORDER — EPHEDRINE SULFATE 50 MG/ML IJ SOLN
INTRAMUSCULAR | Status: DC | PRN
  Administered 2018-10-24: 10 mg via INTRAVENOUS

## 2018-10-24 MED ORDER — HYDROMORPHONE HCL 1 MG/ML IJ SOLN
1.0000 mg | INTRAMUSCULAR | Status: DC | PRN
  Administered 2018-10-24 – 2018-10-25 (×4): 1 mg via INTRAVENOUS
  Filled 2018-10-24 (×5): qty 1

## 2018-10-24 MED ORDER — IOPAMIDOL (ISOVUE-200) INJECTION 41%
INTRAVENOUS | Status: DC | PRN
  Administered 2018-10-24: 8 mL via INTRAVENOUS

## 2018-10-24 MED ORDER — ACETAMINOPHEN 325 MG PO TABS
650.0000 mg | ORAL_TABLET | Freq: Four times a day (QID) | ORAL | Status: DC | PRN
  Administered 2018-10-24: 650 mg via ORAL
  Filled 2018-10-24: qty 2

## 2018-10-24 MED ORDER — SEVOFLURANE IN SOLN
RESPIRATORY_TRACT | Status: AC
  Filled 2018-10-24: qty 250

## 2018-10-24 SURGICAL SUPPLY — 33 items
BAG DRAIN CYSTO-URO LG1000N (MISCELLANEOUS) ×8 IMPLANT
BASKET ZERO TIP 1.9FR (BASKET) ×2 IMPLANT
BRUSH SCRUB EZ  4% CHG (MISCELLANEOUS) ×2
BRUSH SCRUB EZ 1% IODOPHOR (MISCELLANEOUS) ×4 IMPLANT
BRUSH SCRUB EZ 4% CHG (MISCELLANEOUS) ×2 IMPLANT
BSKT STON RTRVL ZERO TP 1.9FR (BASKET) ×2
CATH URETL 5X70 OPEN END (CATHETERS) ×4 IMPLANT
CNTNR SPEC 2.5X3XGRAD LEK (MISCELLANEOUS)
CONRAY 43 FOR UROLOGY 50M (MISCELLANEOUS) ×4 IMPLANT
CONT SPEC 4OZ STER OR WHT (MISCELLANEOUS)
CONT SPEC 4OZ STRL OR WHT (MISCELLANEOUS)
CONTAINER SPEC 2.5X3XGRAD LEK (MISCELLANEOUS) IMPLANT
DRAPE UTILITY 15X26 TOWEL STRL (DRAPES) ×4 IMPLANT
FIBER LASER LITHO 273 (Laser) ×2 IMPLANT
GLOVE BIO SURGEON STRL SZ 6.5 (GLOVE) ×3 IMPLANT
GLOVE BIO SURGEONS STRL SZ 6.5 (GLOVE) ×1
GOWN STRL REUS W/ TWL LRG LVL3 (GOWN DISPOSABLE) ×4 IMPLANT
GOWN STRL REUS W/TWL LRG LVL3 (GOWN DISPOSABLE) ×8
GUIDEWIRE GREEN .038 145CM (MISCELLANEOUS) IMPLANT
GUIDEWIRE STR DUAL SENSOR (WIRE) ×4 IMPLANT
INFUSOR MANOMETER BAG 3000ML (MISCELLANEOUS) ×4 IMPLANT
INTRODUCER DILATOR DOUBLE (INTRODUCER) IMPLANT
KIT TURNOVER CYSTO (KITS) ×4 IMPLANT
PACK CYSTO AR (MISCELLANEOUS) ×4 IMPLANT
SET CYSTO W/LG BORE CLAMP LF (SET/KITS/TRAYS/PACK) ×4 IMPLANT
SHEATH URETERAL 12FRX35CM (MISCELLANEOUS) IMPLANT
SOL .9 NS 3000ML IRR  AL (IV SOLUTION) ×2
SOL .9 NS 3000ML IRR AL (IV SOLUTION) ×2
SOL .9 NS 3000ML IRR UROMATIC (IV SOLUTION) ×2 IMPLANT
STENT URET 6FRX24 CONTOUR (STENTS) ×4 IMPLANT
STENT URET 6FRX26 CONTOUR (STENTS) IMPLANT
SURGILUBE 2OZ TUBE FLIPTOP (MISCELLANEOUS) ×4 IMPLANT
WATER STERILE IRR 1000ML POUR (IV SOLUTION) ×4 IMPLANT

## 2018-10-24 NOTE — Progress Notes (Signed)
Patient on and off resting comfortably.  Patient does not Want more pain medication as this continues to make her feel nauseated off and on.  Patient asked for water or ice and reassured patient after her nausea resolved we could do that.  Patient anxious at times and tearful.  Reassured patient , patient apologized for being irritable and emotional. Patient did not void on bedpan and this seemed to make her more anxious.

## 2018-10-24 NOTE — H&P (Signed)
[facility] [place] [facility]   PATIENT NAME: Brenda Hawkins    MR#:  [id number]  DATE OF BIRTH:  [birth year]  DATE OF ADMISSION:  10/23/2018  PRIMARY CARE PHYSICIAN: Excell SeltzerBedsole, Amy E, MD   REQUESTING/REFERRING PHYSICIAN: Sharman CheekStafford, Phillip, MD  CHIEF COMPLAINT:   Chief Complaint  Patient presents with  . Flank Pain    HISTORY OF PRESENT ILLNESS:  Brenda Mareseresa [place]  is a 53 y.o. female with a known history of gastric bypass p/w AP. Endorses diffuse migratory AP, mild x2-3wks, became acutely severe Mon 2/10. Nausea w/o vomiting, (+) dry heaving. (-) diarrhea. (-) burning/pain w/ urination. (+) hematuria x1d (Mon 02/10), resolved, no recurrence. Denies Hx of nephrolithiasis. CT A/P (+) 10mm R UPJ stone + hydro.  PAST MEDICAL HISTORY:   Past Medical History:  Diagnosis Date  . Anemia   . Anxiety   . Asthma   . Diabetes mellitus type II   . GERD (gastroesophageal reflux disease)   . Hypertension   . Obesity   . Osteopenia   . Sleep apnea   . 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 Vitamin B12 deficiency    since bariatric surgery    PAST SURGICAL HISTORY:   Past Surgical History:  Procedure Laterality Date  . ESOPHAGOGASTRODUODENOSCOPY (EGD) WITH PROPOFOL N/A 06/01/2018   Procedure: ESOPHAGOGASTRODUODENOSCOPY (EGD) WITH PROPOFOL;  Surgeon: Toney ReilVanga, Rohini Reddy, MD;  [facility] ENDOSCOPY;  Service: Gastroenterology;  Laterality: N/A;  . 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 REDUCTION MAMMAPLASTY Right 1992    SOCIAL HISTORY:   Social History   Tobacco Use  . Smoking status: Current Every Day Smoker    Packs/day: 1.00  . Smokeless tobacco: Never Used  Substance Use Topics  . Alcohol use: No    FAMILY HISTORY:   Family History  Problem Relation Age of Onset  . Heart disease Mother        valve replacement surgery  . Coronary artery disease Father   . Breast cancer Neg Hx     DRUG ALLERGIES:   Allergies  Allergen Reactions  . Codeine Sulfate Other (See Comments)    Reaction: "violently ill"  . Penicillins Other (See Comments)    Reaction: Unsure   Has patient had a PCN reaction causing immediate rash, facial/tongue/throat swelling, SOB or lightheadedness with hypotension: No Has patient had a PCN reaction causing severe rash involving mucus membranes or skin necrosis: No Has patient had a PCN reaction that required hospitalization No Has patient had a PCN reaction occurring within the last 10 years: No If all of the above answers are "NO", then may proceed with Cephalosporin use.     REVIEW OF SYSTEMS:   Review of Systems  Constitutional: Negative for chills, diaphoresis, fever, malaise/fatigue and weight loss.  HENT: Negative for congestion, ear pain, hearing loss, nosebleeds, sinus pain, sore throat and tinnitus.   Eyes: Negative for blurred vision, double vision and photophobia.  Respiratory: Negative for cough, hemoptysis, sputum production, shortness of breath and wheezing.   Cardiovascular: Negative for chest pain, palpitations, orthopnea, claudication, leg swelling and PND.  Gastrointestinal: Positive for abdominal pain and nausea. Negative for blood in stool, constipation, diarrhea, heartburn, melena and vomiting.  Genitourinary: Positive for hematuria. Negative for dysuria, frequency and urgency.  Musculoskeletal: Negative for back pain, joint pain, myalgias and neck pain.  Skin: Negative for itching and rash.  Neurological: Negative for dizziness, tingling, tremors, sensory change, speech change, focal weakness, seizures, loss of consciousness, weakness and headaches.  Psychiatric/Behavioral: Negative for depression and memory loss. The patient is not nervous/anxious and does not have insomnia.    MEDICATIONS AT HOME:   Prior to Admission medications   Medication Sig Start Date End Date Taking? Authorizing Provider  acetaminophen (TYLENOL) 500 MG tablet Take 500 mg by mouth every 6 (six) hours as needed.   Yes [provider]    ciprofloxacin (CIPRO) 500 MG tablet Take 1 tablet (500 mg total) by mouth 2 (two) times daily. 10/21/18  Yes Lynnda Child, MD      VITAL SIGNS:  Blood pressure (!) 151/67, pulse 63, temperature 97.9 F (36.6 C), temperature source Oral, resp. rate 16, height 5' (1.524 m), weight 67.6 kg, SpO2 97 %.  PHYSICAL EXAMINATION:  Physical Exam Constitutional:      General: She is not in acute distress.    Appearance: She is not ill-appearing, toxic-appearing or diaphoretic.  HENT:     Head: Atraumatic.     Mouth/Throat:     Pharynx: Oropharynx is clear.  Eyes:     General: No scleral icterus.    Extraocular Movements: Extraocular movements intact.     Conjunctiva/sclera: Conjunctivae normal.  Neck:     Musculoskeletal: Neck supple.  Cardiovascular:     Rate and Rhythm: Normal rate and regular rhythm.     Heart sounds: Normal heart sounds. No murmur. No friction rub. No gallop.   Pulmonary:     Effort: Pulmonary effort is normal. No respiratory distress.     Breath sounds: Normal breath sounds. No stridor. No wheezing, rhonchi or rales.  Abdominal:     General: Bowel sounds are decreased. There is no distension.     Palpations: Abdomen is soft.     Tenderness: There is generalized abdominal tenderness. There is right CVA tenderness and left CVA tenderness. There is no guarding or rebound.  Musculoskeletal: Normal range of motion.        General: No swelling or tenderness.  Lymphadenopathy:     Cervical: No cervical adenopathy.  Skin:    General: Skin is warm and dry.     Findings: No erythema or rash.  Neurological:     Mental Status: She is alert and oriented to person, place, and time. Mental status is at baseline.  Psychiatric:        Mood and Affect: Mood normal.        Behavior: Behavior normal.        Thought Content: Thought content normal.        Judgment: Judgment normal.    LABORATORY PANEL:   CBC Recent Labs  Lab 10/23/18 2028  WBC 6.9  HGB 15.4*  HCT  45.4  PLT 191   ------------------------------------------------------------------------------------------------------------------  Chemistries  Recent Labs  Lab 10/23/18 2028  NA 142  K 3.3*  CL 110  CO2 25  GLUCOSE 132*  BUN 22*  CREATININE 0.83  CALCIUM 9.0  MG 1.9  AST 20  ALT 10  ALKPHOS 51  BILITOT 0.4   ------------------------------------------------------------------------------------------------------------------  Cardiac Enzymes No results for input(s): TROPONINI in the last 168 hours. ------------------------------------------------------------------------------------------------------------------  RADIOLOGY:  Ct Abdomen Pelvis Wo Contrast  Result Date: 10/23/2018 CLINICAL DATA:  Right flank pain for 1 week. Microscopic hematuria. Stone disease suspected. EXAM: CT ABDOMEN AND PELVIS WITHOUT CONTRAST TECHNIQUE: Multidetector CT imaging of the abdomen and pelvis was performed following the standard protocol without IV contrast. COMPARISON:  None. FINDINGS: Lower chest: No acute findings. Hepatobiliary: No mass visualized on this unenhanced exam. Multiple  tiny gallstones are seen, however there is no evidence of cholecystitis or biliary ductal dilatation. Pancreas: No mass or inflammatory process visualized on this unenhanced exam. Spleen:  Within normal limits in size. Adrenals/Urinary tract: Normal adrenal glands. Several tiny less than 5 mm renal calculi are seen bilaterally. Moderate to severe right hydronephrosis is seen due to a 10 mm calculus at the right UPJ. Moderate left hydronephrosis is seen due to a 4 mm calculus in the distal left ureter and a 3 mm calculus at the left UVJ. Stomach/Bowel: Previous gastric bypass surgery noted. Diverticulosis is seen mainly involving the sigmoid colon, however there is no evidence of diverticulitis. No evidence of obstruction, inflammatory process, or abnormal fluid collections. Vascular/Lymphatic: No pathologically enlarged  lymph nodes identified. No evidence of abdominal aortic aneurysm. Aortic atherosclerosis. Reproductive:  No mass or other significant abnormality. Other:  None. Musculoskeletal:  No suspicious bone lesions identified. IMPRESSION: 1. Moderate to severe right hydronephrosis due to 10 mm calculus at the right UPJ. 2. Moderate left hydronephrosis due to 4 mm calculus in distal left ureter and 3 mm calculus at left UVJ. 3. Tiny bilateral intrarenal calculi. 4. Cholelithiasis. No radiographic evidence of cholecystitis. 5. Colonic diverticulosis, without radiographic evidence of diverticulitis. Electronically Signed   By: Myles Rosenthal M.D.   On: 10/23/2018 16:10   IMPRESSION AND PLAN:   A/P: 43F w/ 48mm R UPJ stone + obstructive uropathy (R hydro). Hypokalemia, hyperglycemia. -Nephrolithiasis, obstructive uropathy, hydronephrosis: U/A will likely not reflect potentially infected and non-draining urine proximal to R-sided obstruction. Ceftriaxone. IVF, pain ctrl, antiemetics. Urology consult. Suspect pt will need laser lithotripsy/stent. NPO. -Hypokalemia: Replete and monitor. -c/w other home meds/formulary subs as tolerated. -FEN/GI: NPO. -DVT PPx: Lovenox. -Code status: Full code. -Disposition: Observation, < 2 midnights. Lithotripsy can be performed as an outpt procedure, and I am unsure if she will stay; however, pt may meet utilization review criteria for inpatient admission.   All the records are reviewed and case discussed with ED provider. Management plans discussed with the patient, family and they are in agreement.  CODE STATUS: Full code.  TOTAL TIME TAKING CARE OF THIS PATIENT: 75 minutes.    Barbaraann Rondo M.D on 10/24/2018 at 3:21 AM  Between 7am to 6pm - Pager - [phone]  After 6pm go to [email or web address] - Social research officer, government  [facility] [place] [facility]  Office  [phone]  CC: Primary care physician; Excell Seltzer, MD   Note: This dictation was prepared with  Dragon dictation along with smaller phrase technology. Any transcriptional errors that result from this process are unintentional.

## 2018-10-24 NOTE — Progress Notes (Signed)
15 minute call to floor. 

## 2018-10-24 NOTE — Consult Note (Signed)
H&P Physician requesting consult: Brenda Flavors, MD  Chief Complaint: Bilateral ureteral calculi  History of Present Illness: 53 year old female with a several week history of bilateral intermittent flank pain.  She went to her primary care physician a couple of days ago and was diagnosed with pyelonephritis and given ciprofloxacin.  Her urine culture was negative at that time.  She presented to the emergency department with severe right-sided flank pain as well as nausea and vomiting.  No fever or chills.  She has been afebrile here.  No leukocytosis.  Creatinine is within normal limits.  Urinalysis is not suggestive of infection.  She underwent a CT scan which revealed a 10 mm right ureteropelvic junction calculus as well as to small distal left ureteral calculi.  There was bilateral hydronephrosis.  She continues to make some urine.  She also continues to have significant nausea and vomiting.  She also is having intermittent pain though it is better controlled than it was last night.  She denies any fever at home.  This is her first time having stones.  She has never required intervention in the past.  Past Medical History:  Diagnosis Date  . Anemia   . Anxiety   . Asthma   . Diabetes mellitus type II   . GERD (gastroesophageal reflux disease)   . Hypertension   . Obesity   . Osteopenia   . Sleep apnea   . 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 Vitamin B12 deficiency    since bariatric surgery   Past Surgical History:  Procedure Laterality Date  . ESOPHAGOGASTRODUODENOSCOPY (EGD) WITH PROPOFOL N/A 06/01/2018   Procedure: ESOPHAGOGASTRODUODENOSCOPY (EGD) WITH PROPOFOL;  Surgeon: Toney Reil, MD;  Location: [facility] ENDOSCOPY;  Service: Gastroenterology;  Laterality: N/A;  . 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 REDUCTION MAMMAPLASTY Right 1992    Home Medications:  Medications Prior to Admission  Medication Sig Dispense Refill Last Dose  . acetaminophen (TYLENOL) 500 MG tablet Take 500 mg by mouth every 6  (six) hours as needed.   10/23/2018 at 1030  . ciprofloxacin (CIPRO) 500 MG tablet Take 1 tablet (500 mg total) by mouth 2 (two) times daily. 20 tablet 0 10/23/2018 at 1030   Allergies:  Allergies  Allergen Reactions  . Codeine Sulfate Other (See Comments)    Reaction: "violently ill"  . Penicillins Other (See Comments)    Reaction: Unsure   Has patient had a PCN reaction causing immediate rash, facial/tongue/throat swelling, SOB or lightheadedness with hypotension: No Has patient had a PCN reaction causing severe rash involving mucus membranes or skin necrosis: No Has patient had a PCN reaction that required hospitalization No Has patient had a PCN reaction occurring within the last 10 years: No If all of the above answers are "NO", then may proceed with Cephalosporin use.     Family History  Problem Relation Age of Onset  . Heart disease Mother        valve replacement surgery  . Coronary artery disease Father   . Breast cancer Neg Hx    Social History:  reports that she has been smoking. She has been smoking about 1.00 pack per day. She has never used smokeless tobacco. She reports that she does not drink alcohol or use drugs.  ROS: A complete review of systems was performed.  All systems are negative except for pertinent findings as noted. ROS   Physical Exam:  Vital signs in last 24 hours: Temp:  [97.9 F (36.6 C)-98.1 F (36.7 C)] 97.9  F (36.6 C) (02/15 0118) Pulse Rate:  [61-77] 63 (02/15 0118) Resp:  [7-18] 16 (02/15 0118) BP: (134-184)/(67-93) 151/67 (02/15 0118) SpO2:  [92 %-97 %] 97 % (02/15 0118) Weight:  [65.5 kg-67.6 kg] 65.5 kg (02/15 0118) General:  Alert and oriented, No acute distress.  Appears uncomfortable secondary to nausea HEENT: Normocephalic, atraumatic Neck: No JVD or lymphadenopathy Cardiovascular: Good perfusion of extremities Lungs: Regular rate and effort Abdomen: Soft, nontender, nondistended, no abdominal masses Back: No CVA  tenderness Extremities: No edema Neurologic: Grossly intact  Laboratory Data:  Results for orders placed or performed during the hospital encounter of 10/23/18 (from the past 24 hour(s))  Urinalysis, Complete w Microscopic     Status: Abnormal   Collection Time: 10/23/18  5:52 PM  Result Value Ref Range   Color, Urine YELLOW (A) YELLOW   APPearance CLEAR (A) CLEAR   Specific Gravity, Urine 1.015 1.005 - 1.030   pH 5.0 5.0 - 8.0   Glucose, UA NEGATIVE NEGATIVE mg/dL   Hgb urine dipstick MODERATE (A) NEGATIVE   Bilirubin Urine NEGATIVE NEGATIVE   Ketones, ur NEGATIVE NEGATIVE mg/dL   Protein, ur NEGATIVE NEGATIVE mg/dL   Nitrite NEGATIVE NEGATIVE   Leukocytes,Ua TRACE (A) NEGATIVE   RBC / HPF 11-20 0 - 5 RBC/hpf   WBC, UA 11-20 0 - 5 WBC/hpf   Bacteria, UA RARE (A) NONE SEEN   Squamous Epithelial / LPF 0-5 0 - 5   Mucus PRESENT   Basic metabolic panel     Status: Abnormal   Collection Time: 10/23/18  8:28 PM  Result Value Ref Range   Sodium 142 135 - 145 mmol/L   Potassium 3.3 (L) 3.5 - 5.1 mmol/L   Chloride 110 98 - 111 mmol/L   CO2 25 22 - 32 mmol/L   Glucose, Bld 132 (H) 70 - 99 mg/dL   BUN 22 (H) 6 - 20 mg/dL   Creatinine, Ser 8.37 0.44 - 1.00 mg/dL   Calcium 9.0 8.9 - 29.0 mg/dL   GFR calc non Af Amer >60 >60 mL/min   GFR calc Af Amer >60 >60 mL/min   Anion gap 7 5 - 15  CBC with Differential     Status: Abnormal   Collection Time: 10/23/18  8:28 PM  Result Value Ref Range   WBC 6.9 4.0 - 10.5 K/uL   RBC 4.12 3.87 - 5.11 MIL/uL   Hemoglobin 15.4 (H) 12.0 - 15.0 g/dL   HCT 21.1 15.5 - 20.8 %   MCV 110.2 (H) 80.0 - 100.0 fL   MCH 37.4 (H) 26.0 - 34.0 pg   MCHC 33.9 30.0 - 36.0 g/dL   RDW 02.2 33.6 - 12.2 %   Platelets 191 150 - 400 K/uL   nRBC 0.0 0.0 - 0.2 %   Neutrophils Relative % 70 %   Neutro Abs 4.9 1.7 - 7.7 K/uL   Lymphocytes Relative 16 %   Lymphs Abs 1.1 0.7 - 4.0 K/uL   Monocytes Relative 9 %   Monocytes Absolute 0.6 0.1 - 1.0 K/uL   Eosinophils  Relative 3 %   Eosinophils Absolute 0.2 0.0 - 0.5 K/uL   Basophils Relative 1 %   Basophils Absolute 0.1 0.0 - 0.1 K/uL   Immature Granulocytes 1 %   Abs Immature Granulocytes 0.04 0.00 - 0.07 K/uL  Magnesium     Status: None   Collection Time: 10/23/18  8:28 PM  Result Value Ref Range   Magnesium 1.9 1.7 - 2.4 mg/dL  Phosphorus     Status: None   Collection Time: 10/23/18  8:28 PM  Result Value Ref Range   Phosphorus 4.1 2.5 - 4.6 mg/dL  Hepatic function panel     Status: None   Collection Time: 10/23/18  8:28 PM  Result Value Ref Range   Total Protein 6.7 6.5 - 8.1 g/dL   Albumin 3.6 3.5 - 5.0 g/dL   AST 20 15 - 41 U/L   ALT 10 0 - 44 U/L   Alkaline Phosphatase 51 38 - 126 U/L   Total Bilirubin 0.4 0.3 - 1.2 mg/dL   Bilirubin, Direct <0.1 0.0 - 0.2 mg/dL   Indirect Bilirubin NOT CALCULATED 0.3 - 0.9 mg/dL   Recent Results (from the past 240 hour(s))  Urine Culture     Status: None   Collection Time: 10/21/18  9:25 AM  Result Value Ref Range Status   MICRO NUMBER: [id number]  Final   SPECIMEN QUALITY: Adequate  Final   Sample Source URINE  Final   STATUS: FINAL  Final   ISOLATE 1:   Final    Multiple organisms present, each less than 10,000 CFU/mL. These organisms, commonly found on external and internal genitalia, are considered to be colonizers. No further testing performed.   Creatinine: Recent Labs    10/23/18 2028  CREATININE 0.83   CT scan personally reviewed and is detailed in the history of present illness  Impression/Assessment:  Bilateral ureteral calculi Bilateral hydronephrosis secondary to obstructing calculi Renal colic Nausea/vomiting secondary to the above  Plan:  I discussed different options with her.  The best option would be cystoscopy with bilateral ureteroscopy, laser lithotripsy, ureteral stent placement.  I discussed the possibility of only performing stent placement and having a staged procedure.  I discussed the different risk including but  not limited to bleeding, infection, injury to surrounding structures including possible ureteral avulsion that would require an abdominal surgery to repair which is rare, need for additional procedures.  She has elected to proceed.  Eugene D Bell, III 10/24/2018, 11:19 AM   

## 2018-10-24 NOTE — H&P (View-Only) (Signed)
H&P Physician requesting consult: [name], MD  Chief Complaint: Bilateral ureteral calculi  History of Present Illness: 53 year old female with a several week history of bilateral intermittent flank pain.  She went to her primary care physician a couple of days ago and was diagnosed with pyelonephritis and given ciprofloxacin.  Her urine culture was negative at that time.  She presented to the emergency department with severe right-sided flank pain as well as nausea and vomiting.  No fever or chills.  She has been afebrile here.  No leukocytosis.  Creatinine is within normal limits.  Urinalysis is not suggestive of infection.  She underwent a CT scan which revealed a 10 mm right ureteropelvic junction calculus as well as to small distal left ureteral calculi.  There was bilateral hydronephrosis.  She continues to make some urine.  She also continues to have significant nausea and vomiting.  She also is having intermittent pain though it is better controlled than it was last night.  She denies any fever at home.  This is her first time having stones.  She has never required intervention in the past.  Past Medical History:  Diagnosis Date  . Anemia   . Anxiety   . Asthma   . Diabetes mellitus type II   . GERD (gastroesophageal reflux disease)   . Hypertension   . Obesity   . Osteopenia   . Sleep apnea   . 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 Vitamin B12 deficiency    since bariatric surgery   Past Surgical History:  Procedure Laterality Date  . ESOPHAGOGASTRODUODENOSCOPY (EGD) WITH PROPOFOL N/A 06/01/2018   Procedure: ESOPHAGOGASTRODUODENOSCOPY (EGD) WITH PROPOFOL;  Surgeon: Toney Reil, MD;  Location: [facility] ENDOSCOPY;  Service: Gastroenterology;  Laterality: N/A;  . 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 REDUCTION MAMMAPLASTY Right 1992    Home Medications:  Medications Prior to Admission  Medication Sig Dispense Refill Last Dose  . acetaminophen (TYLENOL) 500 MG tablet Take 500 mg by mouth every 6  (six) hours as needed.   10/23/2018 at 1030  . ciprofloxacin (CIPRO) 500 MG tablet Take 1 tablet (500 mg total) by mouth 2 (two) times daily. 20 tablet 0 10/23/2018 at 1030   Allergies:  Allergies  Allergen Reactions  . Codeine Sulfate Other (See Comments)    Reaction: "violently ill"  . Penicillins Other (See Comments)    Reaction: Unsure   Has patient had a PCN reaction causing immediate rash, facial/tongue/throat swelling, SOB or lightheadedness with hypotension: No Has patient had a PCN reaction causing severe rash involving mucus membranes or skin necrosis: No Has patient had a PCN reaction that required hospitalization No Has patient had a PCN reaction occurring within the last 10 years: No If all of the above answers are "NO", then may proceed with Cephalosporin use.     Family History  Problem Relation Age of Onset  . Heart disease Mother        valve replacement surgery  . Coronary artery disease Father   . Breast cancer Neg Hx    Social History:  reports that she has been smoking. She has been smoking about 1.00 pack per day. She has never used smokeless tobacco. She reports that she does not drink alcohol or use drugs.  ROS: A complete review of systems was performed.  All systems are negative except for pertinent findings as noted. ROS   Physical Exam:  Vital signs in last 24 hours: Temp:  [97.9 F (36.6 C)-98.1 F (36.7 C)] 97.9  F (36.6 C) (02/15 0118) Pulse Rate:  [61-77] 63 (02/15 0118) Resp:  [7-18] 16 (02/15 0118) BP: (134-184)/(67-93) 151/67 (02/15 0118) SpO2:  [92 %-97 %] 97 % (02/15 0118) Weight:  [65.5 kg-67.6 kg] 65.5 kg (02/15 0118) General:  Alert and oriented, No acute distress.  Appears uncomfortable secondary to nausea HEENT: Normocephalic, atraumatic Neck: No JVD or lymphadenopathy Cardiovascular: Good perfusion of extremities Lungs: Regular rate and effort Abdomen: Soft, nontender, nondistended, no abdominal masses Back: No CVA  tenderness Extremities: No edema Neurologic: Grossly intact  Laboratory Data:  Results for orders placed or performed during the hospital encounter of 10/23/18 (from the past 24 hour(s))  Urinalysis, Complete w Microscopic     Status: Abnormal   Collection Time: 10/23/18  5:52 PM  Result Value Ref Range   Color, Urine YELLOW (A) YELLOW   APPearance CLEAR (A) CLEAR   Specific Gravity, Urine 1.015 1.005 - 1.030   pH 5.0 5.0 - 8.0   Glucose, UA NEGATIVE NEGATIVE mg/dL   Hgb urine dipstick MODERATE (A) NEGATIVE   Bilirubin Urine NEGATIVE NEGATIVE   Ketones, ur NEGATIVE NEGATIVE mg/dL   Protein, ur NEGATIVE NEGATIVE mg/dL   Nitrite NEGATIVE NEGATIVE   Leukocytes,Ua TRACE (A) NEGATIVE   RBC / HPF 11-20 0 - 5 RBC/hpf   WBC, UA 11-20 0 - 5 WBC/hpf   Bacteria, UA RARE (A) NONE SEEN   Squamous Epithelial / LPF 0-5 0 - 5   Mucus PRESENT   Basic metabolic panel     Status: Abnormal   Collection Time: 10/23/18  8:28 PM  Result Value Ref Range   Sodium 142 135 - 145 mmol/L   Potassium 3.3 (L) 3.5 - 5.1 mmol/L   Chloride 110 98 - 111 mmol/L   CO2 25 22 - 32 mmol/L   Glucose, Bld 132 (H) 70 - 99 mg/dL   BUN 22 (H) 6 - 20 mg/dL   Creatinine, Ser 8.37 0.44 - 1.00 mg/dL   Calcium 9.0 8.9 - 29.0 mg/dL   GFR calc non Af Amer >60 >60 mL/min   GFR calc Af Amer >60 >60 mL/min   Anion gap 7 5 - 15  CBC with Differential     Status: Abnormal   Collection Time: 10/23/18  8:28 PM  Result Value Ref Range   WBC 6.9 4.0 - 10.5 K/uL   RBC 4.12 3.87 - 5.11 MIL/uL   Hemoglobin 15.4 (H) 12.0 - 15.0 g/dL   HCT 21.1 15.5 - 20.8 %   MCV 110.2 (H) 80.0 - 100.0 fL   MCH 37.4 (H) 26.0 - 34.0 pg   MCHC 33.9 30.0 - 36.0 g/dL   RDW 02.2 33.6 - 12.2 %   Platelets 191 150 - 400 K/uL   nRBC 0.0 0.0 - 0.2 %   Neutrophils Relative % 70 %   Neutro Abs 4.9 1.7 - 7.7 K/uL   Lymphocytes Relative 16 %   Lymphs Abs 1.1 0.7 - 4.0 K/uL   Monocytes Relative 9 %   Monocytes Absolute 0.6 0.1 - 1.0 K/uL   Eosinophils  Relative 3 %   Eosinophils Absolute 0.2 0.0 - 0.5 K/uL   Basophils Relative 1 %   Basophils Absolute 0.1 0.0 - 0.1 K/uL   Immature Granulocytes 1 %   Abs Immature Granulocytes 0.04 0.00 - 0.07 K/uL  Magnesium     Status: None   Collection Time: 10/23/18  8:28 PM  Result Value Ref Range   Magnesium 1.9 1.7 - 2.4 mg/dL  Phosphorus     Status: None   Collection Time: 10/23/18  8:28 PM  Result Value Ref Range   Phosphorus 4.1 2.5 - 4.6 mg/dL  Hepatic function panel     Status: None   Collection Time: 10/23/18  8:28 PM  Result Value Ref Range   Total Protein 6.7 6.5 - 8.1 g/dL   Albumin 3.6 3.5 - 5.0 g/dL   AST 20 15 - 41 U/L   ALT 10 0 - 44 U/L   Alkaline Phosphatase 51 38 - 126 U/L   Total Bilirubin 0.4 0.3 - 1.2 mg/dL   Bilirubin, Direct <1.6<0.1 0.0 - 0.2 mg/dL   Indirect Bilirubin NOT CALCULATED 0.3 - 0.9 mg/dL   Recent Results (from the past 240 hour(s))  Urine Culture     Status: None   Collection Time: 10/21/18  9:25 AM  Result Value Ref Range Status   MICRO NUMBER: [id number]  Final   SPECIMEN QUALITY: Adequate  Final   Sample Source URINE  Final   STATUS: FINAL  Final   ISOLATE 1:   Final    Multiple organisms present, each less than 10,000 CFU/mL. These organisms, commonly found on external and internal genitalia, are considered to be colonizers. No further testing performed.   Creatinine: Recent Labs    10/23/18 2028  CREATININE 0.83   CT scan personally reviewed and is detailed in the history of present illness  Impression/Assessment:  Bilateral ureteral calculi Bilateral hydronephrosis secondary to obstructing calculi Renal colic Nausea/vomiting secondary to the above  Plan:  I discussed different options with her.  The best option would be cystoscopy with bilateral ureteroscopy, laser lithotripsy, ureteral stent placement.  I discussed the possibility of only performing stent placement and having a staged procedure.  I discussed the different risk including but  not limited to bleeding, infection, injury to surrounding structures including possible ureteral avulsion that would require an abdominal surgery to repair which is rare, need for additional procedures.  She has elected to proceed.  Ray ChurchEugene D Bell, III 10/24/2018, 11:19 AM

## 2018-10-24 NOTE — Op Note (Signed)
Operative Note  Preoperative diagnosis:  1.  Bilateral ureteral calculi  Postoperative diagnosis: 1.  Bilateral ureteral calculi  Procedure(s): 1.  Cystoscopy, left retrograde pyelogram, left ureteroscopy with laser lithotripsy stone basketing and ureteral stent placement, right ureteral stent placement  Surgeon: Modena Slater, MD  Assistants: None  Anesthesia: General  Complications: None immediate  EBL: Minimal  Specimens: 1.  None  Drains/Catheters: 1.  Bilateral 6 x 24 double-J ureteral stent  Intraoperative findings: 1.  Normal urethra and bladder 2.  Left retrograde pyelogram revealed a well opacified kidney with clearance of hydronephrosis after decompression/removal of stone.  There were 2 left ureteral calculi.  Successful placement of bilateral ureteral stents  Indication: 53 year old female presented with severe right-sided flank pain.  She was found on CT scan to have bilateral ureteral calculi and presents for the previously mentioned operation.  Description of procedure:  The patient was identified and consent was obtained.  The patient was taken to the operating room and placed in the supine position.  The patient was placed under general anesthesia.  Perioperative antibiotics were administered.  The patient was placed in dorsal lithotomy.  Patient was prepped and draped in a standard sterile fashion and a timeout was performed.  A 21 French rigid cystoscope was advanced into the urethra and into the bladder.  Complete cystoscopy was performed with no abnormal findings.  There was a right ureteral calculus emanating from the left ureteral orifice which was basket extracted atraumatically.  The left ureter was then cannulated with a wire which was advanced up to the kidney under fluoroscopic guidance.  A semirigid ureteroscope was advanced alongside the wire up to the stone of interest in the distal ureter.  This was quite impacted.  There was a large amount of  peri-ureteral edema.  I was able to fragment the stone to tiny fragments.  I advanced the scope up to the renal pelvis and no other ureteral calculi were seen.  I shot a retrograde pyelogram through the scope with the findings noted above.  I then carefully withdrew the scope and in the distal ureter there was a mild amount of trauma from the impacted stone but no serious injury.  I then backloaded the wire onto a rigid cystoscope and advanced that into the bladder followed by routine placement of a 6 x 24 double-J ureteral stent in a standard fashion followed by removal of the wire.  Fluoroscopy confirmed proximal placement and direct visualization confirmed a good coil within the bladder.  I then turned my attention to the right ureteral orifice.  The ureteral orifice was quite small barely able to cannulate with a sensor wire.  I therefore did not feel it was appropriate to try to place a sheath for flexible ureteroscopy.  At this point, I advanced the wire up to the kidney under fluoroscopic guidance followed by routine placement of a 6 x 24 double-J ureteral stent with removal of the wire.  Fluoroscopy confirmed proximal placement and direct visualization confirmed a good coil within the bladder.  I drained the bladder and withdrew the scope.  The patient tolerated the procedure well and was stable postoperatively.  Plan: Return to clinic in 1 to 2 weeks for discussion of left ureteral stent removal with right ureteroscopy with laser lithotripsy and ureteral stent exchange.  She will likely remain overnight for observation but should be able to be discharged from my standpoint.

## 2018-10-24 NOTE — Progress Notes (Signed)
Patient has returned from pacu. Dilaudid has been administered for pain. On call hospitalist was paged. Dr. Elisabeth Pigeon returned call and gave an order for a one time dose of phenergan 12.5mg  IV for nausea. Phenergan has been administered. Patient's husband and daughter are at the bedside. Clear liquid diet has been initiated. Patient has been voicing her frustrations, stating that she had been told she would feel much better after the surgery but she does not. She was able to void of urine.

## 2018-10-24 NOTE — ED Notes (Signed)
Pt off the floor at this time with NA and husband, transport via stretcher.

## 2018-10-24 NOTE — Interval H&P Note (Signed)
History and Physical Interval Note:  10/24/2018 11:23 AM  Brenda Hawkins  has presented today for surgery, with the diagnosis of bilateral ureteral stones obstructing stone on right  The various methods of treatment have been discussed with the patient and family. After consideration of risks, benefits and other options for treatment, the patient has consented to  Procedure(s): URETEROSCOPY (Bilateral) CYSTOSCOPY WITH HOLMIUM LASER LITHOTRIPSy (Bilateral) CYSTOSCOPY WITH STENT PLACEMENT (Bilateral) as a surgical intervention .  The patient's history has been reviewed, patient examined, no change in status, stable for surgery.  I have reviewed the patient's chart and labs.  Questions were answered to the patient's satisfaction.     Ray Church, III

## 2018-10-24 NOTE — Progress Notes (Signed)
Changed gown and placed dry chux under patient, wet From urine from OR .  Patient did not void in the pacu.

## 2018-10-24 NOTE — Anesthesia Procedure Notes (Signed)
Procedure Name: Intubation Date/Time: 10/24/2018 4:12 PM Performed by: Omer Jack, CRNA Pre-anesthesia Checklist: Patient identified, Patient being monitored, Timeout performed, Emergency Drugs available and Suction available Patient Re-evaluated:Patient Re-evaluated prior to induction Oxygen Delivery Method: Circle system utilized Preoxygenation: Pre-oxygenation with 100% oxygen Induction Type: IV induction Ventilation: Mask ventilation without difficulty Laryngoscope Size: 3 and McGraph Grade View: Grade I Tube type: Oral Tube size: 7.0 mm Number of attempts: 1 Airway Equipment and Method: Stylet Placement Confirmation: ETT inserted through vocal cords under direct vision,  positive ETCO2 and breath sounds checked- equal and bilateral Secured at: 20 cm Tube secured with: Tape Dental Injury: Teeth and Oropharynx as per pre-operative assessment

## 2018-10-24 NOTE — Transfer of Care (Signed)
Immediate Anesthesia Transfer of Care Note  Patient: Brenda Hawkins  Procedure(s) Performed: URETEROSCOPY (Left ) CYSTOSCOPY WITH HOLMIUM LASER LITHOTRIPSy (Left ) CYSTOSCOPY WITH STENT PLACEMENT (Bilateral )  Patient Location: PACU  Anesthesia Type:General  Level of Consciousness: drowsy and patient cooperative  Airway & Oxygen Therapy: Patient Spontanous Breathing and Patient connected to face mask oxygen  Post-op Assessment: Report given to RN and Post -op Vital signs reviewed and stable  Post vital signs: Reviewed and stable  Last Vitals:  Vitals Value Taken Time  BP 170/79 10/24/2018  5:14 PM  Temp 36.6 C 10/24/2018  5:11 PM  Pulse 76 10/24/2018  5:14 PM  Resp 19 10/24/2018  5:14 PM  SpO2 100 % 10/24/2018  5:14 PM    Last Pain:  Vitals:   10/24/18 1711  TempSrc:   PainSc: 0-No pain         Complications: No apparent anesthesia complications

## 2018-10-24 NOTE — Anesthesia Preprocedure Evaluation (Signed)
Anesthesia Evaluation  Patient identified by MRN, date of birth, ID band Patient awake    Reviewed: Allergy & Precautions, H&P , NPO status , Patient's Chart, lab work & pertinent test results  History of Anesthesia Complications Negative for: history of anesthetic complications  Airway Mallampati: III  TM Distance: <3 FB Neck ROM: full    Dental  (+) Chipped, Poor Dentition   Pulmonary asthma , sleep apnea , COPD, Current Smoker,           Cardiovascular Exercise Tolerance: Good hypertension, (-) angina(-) Past MI and (-) DOE      Neuro/Psych PSYCHIATRIC DISORDERS Anxiety negative neurological ROS     GI/Hepatic Neg liver ROS, GERD  Medicated and Controlled,  Endo/Other  diabetes, Type 2  Renal/GU stones  negative genitourinary   Musculoskeletal   Abdominal   Peds  Hematology  (+) anemia ,   Anesthesia Other Findings Past Medical History: No date: Anemia No date: Anxiety No date: Asthma No date: Diabetes mellitus type II No date: GERD (gastroesophageal reflux disease) No date: Hypertension No date: Obesity No date: Osteopenia No date: Sleep apnea 9/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 No date: Vitamin B12 deficiency     Comment:  since bariatric surgery  Past Surgical History: 03/17/02: GASTRIC BYPASS 1992: REDUCTION MAMMAPLASTY; Right  BMI    Body Mass Index:  24.41 kg/m      Reproductive/Obstetrics negative OB ROS                             Anesthesia Physical  Anesthesia Plan  ASA: III and emergent  Anesthesia Plan: General   Post-op Pain Management:    Induction: Intravenous  PONV Risk Score and Plan:   Airway Management Planned: Oral ETT  Additional Equipment:   Intra-op Plan:   Post-operative Plan: Extubation in OR  Informed Consent: I have reviewed the patients History and Physical, chart, labs and discussed the procedure including the risks, benefits and alternatives  for the proposed anesthesia with the patient or authorized representative who has indicated his/her understanding and acceptance.     Dental Advisory Given  Plan Discussed with: Anesthesiologist, CRNA and Surgeon  Anesthesia Plan Comments: (Patient consented for risks of anesthesia including but not limited to:  - adverse reactions to medications - risk of intubation if required - damage to teeth, lips or other oral mucosa - sore throat or hoarseness - Damage to heart, brain, lungs or loss of life  Patient voiced understanding.)        Anesthesia Quick Evaluation

## 2018-10-24 NOTE — Anesthesia Post-op Follow-up Note (Signed)
Anesthesia QCDR form completed.        

## 2018-10-25 DIAGNOSIS — N1330 Unspecified hydronephrosis: Secondary | ICD-10-CM | POA: Diagnosis not present

## 2018-10-25 DIAGNOSIS — N201 Calculus of ureter: Secondary | ICD-10-CM

## 2018-10-25 DIAGNOSIS — N200 Calculus of kidney: Secondary | ICD-10-CM | POA: Diagnosis not present

## 2018-10-25 LAB — URINE CULTURE: Culture: NO GROWTH

## 2018-10-25 MED ORDER — KETOROLAC TROMETHAMINE 10 MG PO TABS: 10.0000 mg | ORAL_TABLET | Freq: Three times a day (TID) | ORAL | 0 refills | Status: DC

## 2018-10-25 MED ORDER — ALUM & MAG HYDROXIDE-SIMETH 200-200-20 MG/5ML PO SUSP
30.0000 mL | ORAL | Status: DC | PRN
  Administered 2018-10-25: 30 mL via ORAL
  Filled 2018-10-25: qty 30

## 2018-10-25 MED ORDER — KETOROLAC TROMETHAMINE 10 MG PO TABS
10.0000 mg | ORAL_TABLET | Freq: Three times a day (TID) | ORAL | Status: DC
  Administered 2018-10-25: 10 mg via ORAL
  Filled 2018-10-25 (×2): qty 1

## 2018-10-25 MED ORDER — CIPROFLOXACIN HCL 500 MG PO TABS
500.0000 mg | ORAL_TABLET | Freq: Two times a day (BID) | ORAL | Status: DC
  Administered 2018-10-25: 500 mg via ORAL
  Filled 2018-10-25: qty 1

## 2018-10-25 MED ORDER — POTASSIUM CHLORIDE CRYS ER 20 MEQ PO TBCR
40.0000 meq | EXTENDED_RELEASE_TABLET | ORAL | Status: DC
  Filled 2018-10-25: qty 2

## 2018-10-25 MED ORDER — ONDANSETRON HCL 4 MG PO TABS: 4.0000 mg | ORAL_TABLET | Freq: Four times a day (QID) | ORAL | 0 refills | Status: DC | PRN

## 2018-10-25 MED ORDER — CIPROFLOXACIN HCL 500 MG PO TABS: 500.0000 mg | ORAL_TABLET | Freq: Two times a day (BID) | ORAL | 0 refills | Status: DC

## 2018-10-25 NOTE — Progress Notes (Signed)
I entered the room and patient was in the bathroom. Patient is agitated - she currently feels like she is having a hard time voiding (stating she is voiding only small amounts at a time) and is concerned that her urine is red. I explained that red urine can be normal after having stents placed. She stated that right now she feels like she has to void but she can't. However, she had voided while she was in the bathroom. I bladder scanned her to check for any urinary retention since she stated she still felt the need to void - bladder scan showed only 41ml. She has voided total so far since 1900 for my shift. I explained that her urine output has been very good. She is still agitated. I will continue to monitor, reassure, and educate her.

## 2018-10-25 NOTE — Progress Notes (Signed)
[facility] [place] [facility]   PATIENT NAME: Brenda Hawkins    MR#:  [id number]  DATE OF BIRTH:  10-15-1965  SUBJECTIVE: Admitted for flank pain, bilateral ureteral calculi yesterday status post bilateral renal stents.  Has hematuria last night and now but less flank pain.  CHIEF COMPLAINT:   Chief Complaint  Patient presents with  . Flank Pain    REVIEW OF SYSTEMS:   ROS CONSTITUTIONAL: No fever, fatigue or weakness.  EYES: No blurred or double vision.  EARS, NOSE, AND THROAT: No tinnitus or ear pain.  RESPIRATORY: No cough, shortness of breath, wheezing or hemoptysis.  CARDIOVASCULAR: No chest pain, orthopnea, edema.  GASTROINTESTINAL: No nausea, vomiting, diarrhea or abdominal pain.  GENITOURINARY: Has hematuria.  ENDOCRINE: No polyuria, nocturia,  HEMATOLOGY: No anemia, easy bruising or bleeding SKIN: No rash or lesion. MUSCULOSKELETAL: No joint pain or arthritis.   NEUROLOGIC: No tingling, numbness, weakness.  PSYCHIATRY: No anxiety or depression.   DRUG ALLERGIES:   Allergies  Allergen Reactions  . Codeine Sulfate Other (See Comments)    Reaction: "violently ill"  . Penicillins Other (See Comments)    Reaction: Unsure   Has patient had a PCN reaction causing immediate rash, facial/tongue/throat swelling, SOB or lightheadedness with hypotension: No Has patient had a PCN reaction causing severe rash involving mucus membranes or skin necrosis: No Has patient had a PCN reaction that required hospitalization No Has patient had a PCN reaction occurring within the last 10 years: No If all of the above answers are "NO", then may proceed with Cephalosporin use.     VITALS:  Blood pressure (!) 116/59, pulse (!) 59, temperature 98.4 F (36.9 C), temperature source Oral, resp. rate 16, height 5' (1.524 m), weight 65.5 kg, SpO2 95 %.  PHYSICAL EXAMINATION:  GENERAL:  53 y.o.-year-old patient lying in the bed with no acute distress.  EYES:  Pupils equal, round, reactive to light and accommodation. No scleral icterus. Extraocular muscles intact.  HEENT: Head atraumatic, normocephalic. Oropharynx and nasopharynx clear.  NECK:  Supple, no jugular venous distention. No thyroid enlargement, no tenderness.  LUNGS: Normal breath sounds bilaterally, no wheezing, rales,rhonchi or crepitation. No use of accessory muscles of respiration.  CARDIOVASCULAR: S1, S2 normal. No murmurs, rubs, or gallops.  ABDOMEN: Soft, nontender, nondistended. Bowel sounds present. No organomegaly or mass.,  No flank pain EXTREMITIES: No pedal edema, cyanosis, or clubbing.  NEUROLOGIC: Cranial nerves II through XII are intact. Muscle strength 5/5 in all extremities. Sensation intact. Gait not checked.  PSYCHIATRIC: The patient is alert and oriented x 3.  SKIN: No obvious rash, lesion, or ulcer.    LABORATORY PANEL:   CBC Recent Labs  Lab 10/23/18 2028  WBC 6.9  HGB 15.4*  HCT 45.4  PLT 191   ------------------------------------------------------------------------------------------------------------------  Chemistries  Recent Labs  Lab 10/23/18 2028  NA 142  K 3.3*  CL 110  CO2 25  GLUCOSE 132*  BUN 22*  CREATININE 0.83  CALCIUM 9.0  MG 1.9  AST 20  ALT 10  ALKPHOS 51  BILITOT 0.4   ------------------------------------------------------------------------------------------------------------------  Cardiac Enzymes No results for input(s): TROPONINI in the last 168 hours. ------------------------------------------------------------------------------------------------------------------  RADIOLOGY:  Ct Abdomen Pelvis Wo Contrast  Result Date: 10/23/2018 CLINICAL DATA:  Right flank pain for 1 week. Microscopic hematuria. Stone disease suspected. EXAM: CT ABDOMEN AND PELVIS WITHOUT CONTRAST TECHNIQUE: Multidetector CT imaging of the abdomen and pelvis was performed following the standard protocol without IV contrast. COMPARISON:  None.  FINDINGS: Lower chest: No acute findings. Hepatobiliary: No mass visualized on this unenhanced exam. Multiple tiny gallstones are seen, however there is no evidence of cholecystitis or biliary ductal dilatation. Pancreas: No mass or inflammatory process visualized on this unenhanced exam. Spleen:  Within normal limits in size. Adrenals/Urinary tract: Normal adrenal glands. Several tiny less than 5 mm renal calculi are seen bilaterally. Moderate to severe right hydronephrosis is seen due to a 10 mm calculus at the right UPJ. Moderate left hydronephrosis is seen due to a 4 mm calculus in the distal left ureter and a 3 mm calculus at the left UVJ. Stomach/Bowel: Previous gastric bypass surgery noted. Diverticulosis is seen mainly involving the sigmoid colon, however there is no evidence of diverticulitis. No evidence of obstruction, inflammatory process, or abnormal fluid collections. Vascular/Lymphatic: No pathologically enlarged lymph nodes identified. No evidence of abdominal aortic aneurysm. Aortic atherosclerosis. Reproductive:  No mass or other significant abnormality. Other:  None. Musculoskeletal:  No suspicious bone lesions identified. IMPRESSION: 1. Moderate to severe right hydronephrosis due to 10 mm calculus at the right UPJ. 2. Moderate left hydronephrosis due to 4 mm calculus in distal left ureter and 3 mm calculus at left UVJ. 3. Tiny bilateral intrarenal calculi. 4. Cholelithiasis. No radiographic evidence of cholecystitis. 5. Colonic diverticulosis, without radiographic evidence of diverticulitis. Electronically Signed   By: Myles Rosenthal M.D.   On: 10/23/2018 16:10    EKG:  No orders found for this or any previous visit.  ASSESSMENT AND PLAN:   #1 bilateral hydronephrosis secondary to obstructing calculi, patient had bilateral ureteral stents placed for bilateral calculi by urology yesterday evening.  Has some hematuria, flank pain today also but better than yesterday.  I spoke with Dr. Mena Goes  who will see the patient.  Patient has no fever, normal white count.  Her IV access did not work, Lilyan Gilford Discontinue IV fluids, use Toradol for pain control, use Cipro for antibiotic, hopefully I can discharge her later today or tomorrow morning.  Patient wants to talk to urology, spoke with family, spoke with Dr. Mena Goes who will see the patient. Needs to follow-up with urology as an outpatient in 2 weeks for definitive stone management.  #2 .hypokalemia: Replace potassium.   All the records are reviewed and case discussed with Care Management/Social Workerr. Management plans discussed with the patient, family and they are in agreement.  CODE STATUS: Full code  TOTAL TIME TAKING CARE OF THIS PATIENT: 38 minutes.   More than 50% of time spent in counseling, coordination of care, spoke with Dr. Mena Goes, also spoke with patient's family.  POSSIBLE D/C IN 1 DAYS, DEPENDING ON CLINICAL CONDITION.   Katha Hamming M.D on 10/25/2018 at 10:54 AM  Between 7am to 6pm - Pager - [phone]  After 6pm go to [email or web address] - password [email or web address] [facility]  [place] [facility]  Office  [phone]  CC: Primary care physician; Excell Seltzer, MD   Note: This dictation was prepared with Dragon dictation along with smaller phrase technology. Any transcriptional errors that result from this process are unintentional.

## 2018-10-25 NOTE — Anesthesia Postprocedure Evaluation (Signed)
Anesthesia Post Note  Patient: Jernee Kisling  Procedure(s) Performed: URETEROSCOPY (Left ) CYSTOSCOPY WITH HOLMIUM LASER LITHOTRIPSy (Left ) CYSTOSCOPY WITH STENT PLACEMENT (Bilateral )  Patient location during evaluation: PACU Anesthesia Type: General Level of consciousness: awake and alert and oriented Pain management: pain level controlled Vital Signs Assessment: post-procedure vital signs reviewed and stable Respiratory status: spontaneous breathing Cardiovascular status: blood pressure returned to baseline Anesthetic complications: no     Last Vitals:  Vitals:   10/25/18 0008 10/25/18 0412  BP: 122/85 (!) 116/59  Pulse: (!) 59 (!) 59  Resp: 16 16  Temp: 36.9 C 36.9 C  SpO2: 97% 95%    Last Pain:  Vitals:   10/25/18 0412  TempSrc: Oral  PainSc:                  Deisy Ozbun

## 2018-10-25 NOTE — Progress Notes (Signed)
1 Day Post-Op Subjective: Patient reports bilateral flank pain especially when she voids.  No dysuria.  Objective: Vital signs in last 24 hours: Temp:  [97.9 F (36.6 C)-98.6 F (37 C)] 98.6 F (37 C) (02/16 1152) Pulse Rate:  [59-87] 63 (02/16 1152) Resp:  [14-25] 18 (02/16 1152) BP: (116-186)/(59-85) 164/70 (02/16 1152) SpO2:  [95 %-100 %] 99 % (02/16 1152)  Intake/Output from previous day: 02/15 0701 - 02/16 0700 In: 2015.1 [I.V.:1916; IV Piggyback:99.1] Out: 2650 [Urine:2650] Intake/Output this shift: Total I/O In: 0  Out: 700 [Urine:700]  Physical Exam:  No acute distress Talking with her husband and watching TV She was lying in bed and sat up without difficulty Alert and oriented without focal neurologic deficits  Lab Results: Recent Labs    10/23/18 2028  HGB 15.4*  HCT 45.4   BMET Recent Labs    10/23/18 2028  NA 142  K 3.3*  CL 110  CO2 25  GLUCOSE 132*  BUN 22*  CREATININE 0.83  CALCIUM 9.0   No results for input(s): LABPT, INR in the last 72 hours. No results for input(s): LABURIN in the last 72 hours. Results for orders placed or performed during the hospital encounter of 10/23/18  Urine Culture     Status: None   Collection Time: 10/23/18  5:52 PM  Result Value Ref Range Status   Specimen Description   Final    URINE, RANDOM Performed at [facility], [address] [place], [place] [address]    Special Requests   Final    NONE Performed at [facility], [address], [place], [place] [address]    Culture   Final    NO GROWTH Performed at [facility], [address] [place], [place] [address]    Report Status 10/25/2018 FINAL  Final    Studies/Results: No results found.  Assessment/Plan:  Bilateral hydronephrosis, Left distal ureteral stone, 10 mm right proximal ureteral stone- status post left ureteroscopy with clearance of the left distal stones, left ureteral stent, right ureteral stent with Dr.  Alvester Morin 2/15.  The right side was quite narrow and with a large proximal stone he placed a right stent.  I drew them a picture of the anatomy and discussed the rationale for staged procedure and then that she will need cystoscopy with left ureteral stent removal and right ureteroscopy laser lithotripsy and stent exchange.  I guess an alternative would be to do shockwave on the right proximal stone and remove both stents in the office.  Follow-up message has been sent.  Discussed importance of follow-up given the temporary nature of the stents.  Appreciate excellent hospitalist care.  She can be discharged from hospital when medically stable today or tomorrow.  We will sign off for now.   LOS: 0 days   Jerilee Field 10/25/2018, 4:51 PM

## 2018-10-25 NOTE — Progress Notes (Signed)
Urologist rounded, discussed plan and answered questions. Brenda Hawkins inquired about being discharged home and agreed that she feels well enough. Dr. Luberta Mutter was notified of this. She has prepared the discharge information in the computer and also notified this RN by telephone that [name] can be discharged home. Patient is aware and agrees.

## 2018-10-25 NOTE — Progress Notes (Addendum)
Patient was on a clear liquid diet. RN walked into the room and saw patient had finished eating a sandwich her daughter brought her. This was around the same time zofran was administered for nausea. Discussed diet and why it was important. I told her I would advance her diet to full liquid per order in the computer because she stated that she needed something with more substance to eat which is why she was feeling nauseated. I told her we would try a full liquid diet for breakfast and see how she does - if she tolerated breakfast ok we could advance her diet further. Patient and daughter were both ok with this plan.

## 2018-10-25 NOTE — Discharge Instructions (Signed)
Ureteral Stent Implantation, Care After Refer to this sheet in the next few weeks. These instructions provide you with information about caring for yourself after your procedure. Your health care provider may also give you more specific instructions. Your treatment has been planned according to current medical practices, but problems sometimes occur. Call your health care provider if you have any problems or questions after your procedure.  Be sure to keep/make follow-up appointment to have the stents/stone removed.  What can I expect after the procedure? After the procedure, it is common to have:  Nausea.  Mild pain when you urinate. You may feel this pain in your lower back or lower abdomen. Pain should stop within a few minutes after you urinate. This may last for up to 1 week.  A small amount of blood in your urine for several days. Follow these instructions at home:  Medicines  Take over-the-counter and prescription medicines only as told by your health care provider.  If you were prescribed an antibiotic medicine, take it as told by your health care provider. Do not stop taking the antibiotic even if you start to feel better.  Do not drive for 24 hours if you received a sedative.  Do not drive or operate heavy machinery while taking prescription pain medicines. Activity  Return to your normal activities as told by your health care provider. Ask your health care provider what activities are safe for you.  Do not lift anything that is heavier than 10 lb (4.5 kg). Follow this limit for 1 week after your procedure, or for as long as told by your health care provider. General instructions  Watch for any blood in your urine. Call your health care provider if the amount of blood in your urine increases.  If you have a catheter: ? Follow instructions from your health care provider about taking care of your catheter and collection bag. ? Do not take baths, swim, or use a hot tub until  your health care provider approves.  Drink enough fluid to keep your urine clear or pale yellow.  Keep all follow-up visits as told by your health care provider. This is important. Contact a health care provider if:  You have pain that gets worse or does not get better with medicine, especially pain when you urinate.  You have difficulty urinating.  You feel nauseous or you vomit repeatedly during a period of more than 2 days after the procedure. Get help right away if:  Your urine is dark red or has blood clots in it.  You are leaking urine (have incontinence).  The end of the stent comes out of your urethra.  You cannot urinate.  You have sudden, sharp, or severe pain in your abdomen or lower back.  You have a fever. This information is not intended to replace advice given to you by your health care provider. Make sure you discuss any questions you have with your health care provider. Document Released: 04/28/2013 Document Revised: 02/01/2016 Document Reviewed: 03/10/2015 Elsevier Interactive Patient Education  2019 [employer].

## 2018-10-26 ENCOUNTER — Telehealth: Payer: Self-pay | Admitting: Urology

## 2018-10-26 ENCOUNTER — Encounter: Payer: Self-pay | Admitting: Urology

## 2018-10-26 ENCOUNTER — Telehealth: Payer: Self-pay

## 2018-10-26 LAB — HIV ANTIBODY (ROUTINE TESTING W REFLEX): HIV Screen 4th Generation wRfx: NONREACTIVE

## 2018-10-26 NOTE — Telephone Encounter (Signed)
-----   Message from Ray Church III, MD sent at 10/24/2018  6:30 PM EST ----- Please arrange for follow-up with a provider in 1 to 2 weeks for discussion of left ureteral stent removal and right ureteroscopy with laser lithotripsy and ureteral stent exchange

## 2018-10-26 NOTE — Telephone Encounter (Signed)
App made patient is aware  Brenda Hawkins 

## 2018-10-26 NOTE — Telephone Encounter (Signed)
Transition Care Management Follow-up Telephone Call   Date discharged? 10/24/2018-10/25/2018  Diagnosis: Uterolithiasis   How have you been since you were released from the hospital? "I'm still in a lot of pain" Pt reports taking Tylenol for pain, Ketorolac causes nausea.    Do you understand why you were in the hospital? yes   Do you understand the discharge instructions? yes   Where were you discharged to? Home.    Items Reviewed:  Medications reviewed: yes  Allergies reviewed: yes  Dietary changes reviewed: yes  Referrals reviewed: yes   Functional Questionnaire:   Activities of Daily Living (ADLs):   She states they are independent in the following: ambulation, bathing and hygiene, feeding, continence, grooming, toileting and dressing States they require assistance with the following: None.    Any transportation issues/concerns?: no   Any patient concerns? no   Confirmed importance and date/time of follow-up visits scheduled yes  Pt has f/u with Urology on 11/04/2018 to schedule right lithotripsy per patient. She would like to postpone hospital f/u with PCP until after procedure.   Confirmed with patient if condition begins to worsen call PCP or go to the ER.  Patient was given the office number and encouraged to call back with question or concerns.  : yes

## 2018-10-28 NOTE — Discharge Summary (Addendum)
Brenda Hawkins Carol Mele, is a 53 y.o. female  DOB 11/10/1965  MRN [id number].  Admission date:  10/23/2018  Admitting Physician  Barbaraann RondoPrasanna Sridharan, MD  Discharge Date:  10/25/2018   Primary MD  Excell SeltzerBedsole, Amy E, MD  Recommendations for primary care physician for things to follow:   Follow-up with PCP in 1 week   Admission Diagnosis  Ureterolithiasis [N20.1] Flank pain [R10.9] Obstruction of right ureter [N13.5]   Discharge Diagnosis  Ureterolithiasis [N20.1] Flank pain [R10.9] Obstruction of right ureter [N13.5]    Active Problems:   Right nephrolithiasis      Past Medical History:  Diagnosis Date  . Anemia   . Anxiety   . Asthma   . Diabetes mellitus type II   . GERD (gastroesophageal reflux disease)   . Hypertension   . Obesity   . Osteopenia   . Sleep apnea   . 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 Vitamin B12 deficiency    since bariatric surgery    Past Surgical History:  Procedure Laterality Date  . CYSTOSCOPY WITH HOLMIUM LASER LITHOTRIPSY Left 10/24/2018   Procedure: CYSTOSCOPY WITH HOLMIUM LASER LITHOTRIPSy;  Surgeon: Crista ElliotBell, Eugene D III, MD;  Location: [facility];  Service: Urology;  Laterality: Left;  . CYSTOSCOPY WITH STENT PLACEMENT Bilateral 10/24/2018   Procedure: CYSTOSCOPY WITH STENT PLACEMENT;  Surgeon: Crista ElliotBell, Eugene D III, MD;  Location: [facility];  Service: Urology;  Laterality: Bilateral;  . ESOPHAGOGASTRODUODENOSCOPY (EGD) WITH PROPOFOL N/A 06/01/2018   Procedure: ESOPHAGOGASTRODUODENOSCOPY (EGD) WITH PROPOFOL;  Surgeon: Toney ReilVanga, Rohini Reddy, MD;  [facility];  Service: Gastroenterology;  Laterality: N/A;  . 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 REDUCTION MAMMAPLASTY Right 1992  . URETEROSCOPY Left 10/24/2018   Procedure: URETEROSCOPY;  Surgeon: Crista ElliotBell, Eugene D III, MD;  Location: [facility];  Service: Urology;   Laterality: Left;       History of present illness and  Hospital Course:     Kindly see H&P for history of present illness and admission details, please review complete Labs, Consult reports and Test reports for all details in brief  HPI  from the history and physical done on the day of admission 53 year old female with a history of a gastric bypass came in because of diffuse back pain for about 2 to 3 weeks gotten worse with nausea, vomiting, dysuria, patient found to have bilateral hydronephrosis, 10 mm right UPJ stone.   Hospital Course  Patient has acute flank pain bilaterally patient found to have obstructive uropathy with.  Bilateral hydronephrosis secondary to obstructing calculi, patient received aggressive hydration, nausea medicines and IV pain medicines, [facility], patient had bilateral ureteral stent placed, seen by and followed by urology, next day patient had some hematuria, flank pain for which she required Toradol for pain control as she could not tolerate codeine.  Patient had left her distal stone, right side also was quite narrow with large proximal stone, patient needs to follow-up with urology as an outpatient regarding lithotripsy and stent removal.  We will give prescription for 3 days worth of antibiotics and also Toradol.  2 .hypokalemia: Replaced the potassium.  Discharge Condition: Stable   Follow UP  Follow-up Information    Bedsole, Amy E, MD. Schedule an appointment as soon as possible for a visit in 1 week(s).   Specialty:  Family Medicine Contact information: [address] [place] [id number] [phone]        Sondra ComeSninsky, Brian C, MD. Call.   Specialty:  Urology Why:  associate of Dr. Alvester Morin -- call for appointment  Contact information: [address] [place] [address] [phone]             Discharge Instructions  and  Discharge Medications      Allergies as of 10/25/2018       Reactions   Codeine Sulfate Other (See Comments)   Reaction: "violently ill"   Penicillins Other (See Comments)   Reaction: Unsure  Has patient had a PCN reaction causing immediate rash, facial/tongue/throat swelling, SOB or lightheadedness with hypotension: No Has patient had a PCN reaction causing severe rash involving mucus membranes or skin necrosis: No Has patient had a PCN reaction that required hospitalization No Has patient had a PCN reaction occurring within the last 10 years: No If all of the above answers are "NO", then may proceed with Cephalosporin use.      Medication List    TAKE these medications   acetaminophen 500 MG tablet Commonly known as:  TYLENOL Take 500 mg by mouth every 6 (six) hours as needed.   ciprofloxacin 500 MG tablet Commonly known as:  CIPRO Take 1 tablet (500 mg total) by mouth 2 (two) times daily.   ketorolac 10 MG tablet Commonly known as:  TORADOL Take 1 tablet (10 mg total) by mouth every 8 (eight) hours.   ondansetron 4 MG tablet Commonly known as:  ZOFRAN Take 1 tablet (4 mg total) by mouth every 6 (six) hours as needed for nausea.         Diet and Activity recommendation: See Discharge Instructions above   Consults obtained;Urolgy  Major procedures and Radiology Reports - PLEASE review detailed and final reports for all details, in brief -     Ct Abdomen Pelvis Wo Contrast  Result Date: 10/23/2018 CLINICAL DATA:  Right flank pain for 1 week. Microscopic hematuria. Stone disease suspected. EXAM: CT ABDOMEN AND PELVIS WITHOUT CONTRAST TECHNIQUE: Multidetector CT imaging of the abdomen and pelvis was performed following the standard protocol without IV contrast. COMPARISON:  None. FINDINGS: Lower chest: No acute findings. Hepatobiliary: No mass visualized on this unenhanced exam. Multiple tiny gallstones are seen, however there is no evidence of cholecystitis or biliary ductal dilatation. Pancreas: No mass or inflammatory process  visualized on this unenhanced exam. Spleen:  Within normal limits in size. Adrenals/Urinary tract: Normal adrenal glands. Several tiny less than 5 mm renal calculi are seen bilaterally. Moderate to severe right hydronephrosis is seen due to a 10 mm calculus at the right UPJ. Moderate left hydronephrosis is seen due to a 4 mm calculus in the distal left ureter and a 3 mm calculus at the left UVJ. Stomach/Bowel: Previous gastric bypass surgery noted. Diverticulosis is seen mainly involving the sigmoid colon, however there is no evidence of diverticulitis. No evidence of obstruction, inflammatory process, or abnormal fluid collections. Vascular/Lymphatic: No pathologically enlarged lymph nodes identified. No evidence of abdominal aortic aneurysm. Aortic atherosclerosis. Reproductive:  No mass or other significant abnormality. Other:  None. Musculoskeletal:  No suspicious bone lesions identified. IMPRESSION: 1. Moderate to severe right hydronephrosis due to 10 mm calculus at the right UPJ. 2. Moderate left hydronephrosis due to 4 mm calculus in distal left ureter and 3 mm calculus at left UVJ. 3. Tiny bilateral intrarenal calculi. 4. Cholelithiasis. No radiographic evidence of cholecystitis. 5. Colonic diverticulosis, without radiographic evidence of diverticulitis. Electronically Signed   By: Myles Rosenthal M.D.   On: 10/23/2018 16:10    Micro Results  Recent Results (from the past 240 hour(s))  Urine Culture     Status: None   Collection Time: 10/21/18  9:25 AM  Result Value Ref Range Status   MICRO NUMBER: [id number]  Final   SPECIMEN QUALITY: Adequate  Final   Sample Source URINE  Final   STATUS: FINAL  Final   ISOLATE 1:   Final    Multiple organisms present, each less than 10,000 CFU/mL. These organisms, commonly found on external and internal genitalia, are considered to be colonizers. No further testing performed.  Urine Culture     Status: None   Collection Time: 10/23/18  5:52 PM  Result Value  Ref Range Status   Specimen Description   Final    URINE, RANDOM Performed at [facility], [address] [place], [place] [address]    Special Requests   Final    NONE Performed at [facility], [address], [place], [place] [address]    Culture   Final    NO GROWTH Performed at [facility], [address] [place], [place] [address]    Report Status 10/25/2018 FINAL  Final       Today   Subjective:   Denea Grafton today has no headache,no chest abdominal pain,no new weakness tingling or numbness, feels much better wants to go home today.   Objective:   Blood pressure (!) 164/70, pulse 63, temperature 98.6 F (37 C), temperature source Oral, resp. rate 18, height 5' (1.524 m), weight 65.5 kg, SpO2 99 %.  No intake or output data in the 24 hours ending 10/28/18 1345  Exam Awake Alert, Oriented x 3, No new F.N deficits, Normal affect [place].AT,PERRAL Supple Neck,No JVD, No cervical lymphadenopathy appriciated.  Symmetrical Chest wall movement, Good air movement bilaterally, CTAB RRR,No Gallops,Rubs or new Murmurs, No Parasternal Heave +ve B.Sounds, Abd Soft, Non tender, No organomegaly appriciated, No rebound -guarding or rigidity. No Cyanosis, Clubbing or edema, No new Rash or bruise  Data Review   CBC w Diff:  Lab Results  Component Value Date   WBC 6.9 10/23/2018   HGB 15.4 (H) 10/23/2018   HGB 8.9 (L) 01/02/2015   HCT 45.4 10/23/2018   HCT 30.0 (L) 01/02/2015   PLT 191 10/23/2018   PLT 350 01/02/2015   LYMPHOPCT 16 10/23/2018   LYMPHOPCT 23.2 01/02/2015   MONOPCT 9 10/23/2018   MONOPCT 7.4 01/02/2015   EOSPCT 3 10/23/2018   EOSPCT 0.8 01/02/2015   BASOPCT 1 10/23/2018   BASOPCT 1.0 01/02/2015    CMP:  Lab Results  Component Value Date   NA 142 10/23/2018   NA 135 01/02/2015   K 3.3 (L) 10/23/2018   K 4.1 01/02/2015   CL 110 10/23/2018   CL 103 01/02/2015   CO2 25 10/23/2018   CO2 23 01/02/2015   BUN 22 (H)  10/23/2018   BUN 16 01/02/2015   CREATININE 0.83 10/23/2018   CREATININE 0.56 01/02/2015   CREATININE 0.58 02/14/2011   PROT 6.7 10/23/2018   PROT 7.9 01/02/2015   ALBUMIN 3.6 10/23/2018   ALBUMIN 4.3 01/02/2015   BILITOT 0.4 10/23/2018   BILITOT 0.4 01/02/2015   ALKPHOS 51 10/23/2018   ALKPHOS 72 01/02/2015   AST 20 10/23/2018   AST 15 01/02/2015   ALT 10 10/23/2018   ALT 11 (L) 01/02/2015  .   Total Time in preparing paper work, data evaluation and todays exam - 35 minutes  Katha Hamming M.D on 10/25/2018 at 1:45  PM    Note: This dictation was prepared with Dragon dictation along with smaller phrase technology. Any transcriptional errors that result from this process are unintentional.

## 2018-10-31 ENCOUNTER — Other Ambulatory Visit: Payer: Self-pay | Admitting: Hematology and Oncology

## 2018-10-31 ENCOUNTER — Encounter: Payer: Self-pay | Admitting: *Deleted

## 2018-10-31 DIAGNOSIS — E538 Deficiency of other specified B group vitamins: Secondary | ICD-10-CM

## 2018-10-31 NOTE — Progress Notes (Signed)
[facility]     [address]     [place], [place] [address]     Phone: [phone]      Fax: [phone]        Clinic day:  11/02/18  Chief Complaint: Brenda Hawkins is a 53 y.o. female s/p gastric bypass surgery with iron deficiency anemia and B12 deficiency who is seen for 6 month assessment.  HPI: The patient was last seen in medical oncology clinic on 05/05/2018.  At that time, she noted abdominal pain associated with constipation.  She was scheduled to see GI.  Exam was unremarkable.  Hematocrit was normal.  Ferritin was 19.  She saw Dr Brenda Hawkins on 05/22/2018.  Notes reviewed.  She notes a 2 month history of epigastric discomfort.  She was overdue for cancer screening.  EGD and colonoscopy were recommended.  She declined colonoscopy.  CT scan was recommended if EGD was negative.  EGD on 06/01/2018 revealed patent Roux-en-Y gastrojejunostomy characterized by an intact staple line, visible sutures, congestion, erythema, friable mucosa and ulceration.  There was a large clean based gastrojejunal anastomotic ulcer.  There was a normal duodenal bulb and second portion of the duodenum. There was bleeding erosive gastropathy treated with bipolar cautery.  There was normal gastroesophageal junction and esophagus.  Biopsies were negative for malignancy.  There was no H pylori.  IDA was felt secondary to secondary to chronic blood loss from intermittent bleeding from gastric erosions and anastomotic ulcer.  PPI 40mg  BID long term was recommended. It was recommended that she quit smoking to help healing of the ulcer.  IV iron.  Recommend UGI series to further delineate the anatomy of her gastric bypass  She saw Dr. Allegra Hawkins on 06/29/2018.  Notes reviewed.  Epigastric pain had improved.  She stopped smoking 1 week prior. Nexium 40 mg BID x 6 months was recommended.  Abdomen and pelvic CT on 10/23/2018 revealed moderate to severe right hydronephrosis due to 10 mm  calculus at the right UPJ. There was moderate left hydronephrosis due to 4 mm calculus in distal left ureter and 3 mm calculus at left UVJ.  There was tiny bilateral intrarenal calculi.  There was cholelithiasis. There was colonic diverticulosis.  She was admitted to [facility] from 10/23/2018 - 10/25/2018.  She had diffuse back pain for about 2 to 3 weeks with nausea, vomiting, and dysuria.  She received aggressive hydration, nausea medicines and IV pain medicines.  She was seen by urology from [facility].  Bilateral ureteral stent were placed on 10/24/2018 by Dr. Modena Slater..  Notes indicate plan for left ureteral stent removal and right ureteroscopy with laser lithotripsy and ureteral stent exchange.  CBC on 10/23/2018 revealed a hematocrit of 45.4, hemoglobin 15.4, MCV 110.2, platelets 191,000, WBC 6900 with an ANC of 4900.  During the interim, she states that she is still smoking.  She is continues to have pain in her back up to her axillae (right > left).  She continues to have hematuria.  She notes some nausea.     Past Medical History:  Diagnosis Date  . Anemia   . Anxiety   . Asthma   . Diabetes mellitus type II   . GERD (gastroesophageal reflux disease)   . Hypertension   . Obesity   . Osteopenia   . Sleep apnea   . 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 Vitamin B12 deficiency    since bariatric surgery    Past Surgical History:  Procedure Laterality Date  . CYSTOSCOPY WITH HOLMIUM LASER LITHOTRIPSY Left 10/24/2018   Procedure: CYSTOSCOPY WITH HOLMIUM LASER LITHOTRIPSy;  Surgeon: Crista Elliot, MD;  Location: [facility] ORS;  Service: Urology;  Laterality: Left;  . CYSTOSCOPY WITH STENT PLACEMENT Bilateral 10/24/2018   Procedure: CYSTOSCOPY WITH STENT PLACEMENT;  Surgeon: Crista Elliot, MD;  Location: [facility] ORS;  Service: Urology;  Laterality: Bilateral;  . ESOPHAGOGASTRODUODENOSCOPY (EGD) WITH PROPOFOL N/A 06/01/2018   Procedure: ESOPHAGOGASTRODUODENOSCOPY (EGD) WITH PROPOFOL;  Surgeon:  Toney Reil, MD;  Location: [facility] ENDOSCOPY;  Service: Gastroenterology;  Laterality: N/A;  . 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 REDUCTION MAMMAPLASTY Right 1992  . URETEROSCOPY Left 10/24/2018   Procedure: URETEROSCOPY;  Surgeon: Crista Elliot, MD;  Location: [facility] ORS;  Service: Urology;  Laterality: Left;    Family History  Problem Relation Age of Onset  . Heart disease Mother        valve replacement surgery  . Coronary artery disease Father   . Breast cancer Neg Hx     Social History:  reports that she has been smoking. She has been smoking about 1.00 pack per day. She has never used smokeless tobacco. She reports that she does not drink alcohol or use drugs.  She quit smoking > 20 years ago, but started smoking again 3-4 years ago.  She smokes 1 pack a day.  The patient lives in [place]. She is accompanied by her husband, [name], today.   Allergies:  Allergies  Allergen Reactions  . Codeine Sulfate Other (See Comments)    Reaction: "violently ill"  . Penicillins Other (See Comments)    Reaction: Unsure   Has patient had a PCN reaction causing immediate rash, facial/tongue/throat swelling, SOB or lightheadedness with hypotension: No Has patient had a PCN reaction causing severe rash involving mucus membranes or skin necrosis: No Has patient had a PCN reaction that required hospitalization No Has patient had a PCN reaction occurring within the last 10 years: No If all of the above answers are "NO", then may proceed with Cephalosporin use.     Current Medications: Current Outpatient Medications  Medication Sig Dispense Refill  . ketorolac (TORADOL) 10 MG tablet Take 1 tablet (10 mg total) by mouth every 8 (eight) hours. 20 tablet 0  . ondansetron (ZOFRAN) 4 MG tablet Take 1 tablet (4 mg total) by mouth every 6 (six) hours as needed for nausea. 20 tablet 0  . acetaminophen (TYLENOL) 500 MG tablet Take 500 mg by mouth every 6 (six) hours as needed.    . ciprofloxacin  (CIPRO) 500 MG tablet Take 1 tablet (500 mg total) by mouth 2 (two) times daily. (Patient not taking: Reported on 11/02/2018) 20 tablet 0   No current facility-administered medications for this visit.    Facility-Administered Medications Ordered in Other Visits  Medication Dose Route Frequency Provider Last Rate Last Dose  . sodium chloride 0.9 % injection 10 mL  10 mL Intracatheter PRN Jeralyn Ruths, MD        Review of Systems:  GENERAL:  Feels "tired".  No fevers, sweats.  No new weight. PERFORMANCE STATUS (ECOG):  1 HEENT:  No visual changes, runny nose, sore throat, mouth sores or tenderness. Lungs: No shortness of breath or cough.  No hemoptysis. Cardiac:  No chest pain, palpitations, orthopnea, or PND. GI:  Some nausea and dry heaves.  No vomiting, diarrhea, constipation, melena or hematochezia. GU:  Nephrolithiasis s/p stent placements.  Hematuria.  Musculoskeletal:  Back pain up to axillae (right > left).  No joint pain.  No muscle tenderness. Extremities:  No pain or swelling. Skin:  No rashes or skin changes. Neuro:  No headache, numbness or weakness, balance or coordination issues. Endocrine:  No diabetes, thyroid issues, hot flashes or night sweats. Psych:  No mood changes, depression or anxiety. Pain:  Back pain. Review of systems:  All other systems reviewed and found to be negative.  Physical Exam: Blood pressure (!) 175/113, pulse 97, temperature 99.1 F (37.3 C), temperature source Tympanic, resp. rate 18, height 5' (1.524 m), SpO2 100 %. GENERAL:  Well developed, well nourished, woman lying in the exam room holding an emesis bag. MENTAL STATUS:  Alert and oriented to person, place and time. HEAD:  Silver hair.  Normocephalic, atraumatic, face symmetric, no Cushingoid features. EYES:  Blue eyes.  Pupils equal round and reactive to light and accomodation.  No conjunctivitis or scleral icterus. ENT:  Oropharynx clear without lesion.  Tongue normal. Mucous  membranes moist.  RESPIRATORY:  Clear to auscultation without rales, wheezes or rhonchi. CARDIOVASCULAR:  Regular rate and rhythm without murmur, rub or gallop. ABDOMEN:  Soft, right sided tenderness without guarding or rebound.  Active bowel sounds, and no hepatosplenomegaly.  No masses. BACK:  Right sided CVA tenderness. SKIN:  No rashes, ulcers or lesions. EXTREMITIES: No edema, no skin discoloration or tenderness.  No palpable cords. LYMPH NODES: No palpable cervical, supraclavicular, axillary or inguinal adenopathy  NEUROLOGICAL: Unremarkable. PSYCH:  Appropriate.    Appointment on 11/02/2018  Component Date Value Ref Range Status  . WBC 11/02/2018 8.9  4.0 - 10.5 K/uL Final  . RBC 11/02/2018 4.31  3.87 - 5.11 MIL/uL Final  . Hemoglobin 11/02/2018 16.4* 12.0 - 15.0 g/dL Final  . HCT 16/06/9603 46.1* 36.0 - 46.0 % Final  . MCV 11/02/2018 107.0* 80.0 - 100.0 fL Final  . MCH 11/02/2018 38.1* 26.0 - 34.0 pg Final  . MCHC 11/02/2018 35.6  30.0 - 36.0 g/dL Final  . RDW 54/05/8118 12.2  11.5 - 15.5 % Final  . Platelets 11/02/2018 278  150 - 400 K/uL Final  . nRBC 11/02/2018 0.0  0.0 - 0.2 % Final  . Neutrophils Relative % 11/02/2018 78  % Final  . Neutro Abs 11/02/2018 7.1  1.7 - 7.7 K/uL Final  . Lymphocytes Relative 11/02/2018 11  % Final  . Lymphs Abs 11/02/2018 1.0  0.7 - 4.0 K/uL Final  . Monocytes Relative 11/02/2018 6  % Final  . Monocytes Absolute 11/02/2018 0.5  0.1 - 1.0 K/uL Final  . Eosinophils Relative 11/02/2018 3  % Final  . Eosinophils Absolute 11/02/2018 0.2  0.0 - 0.5 K/uL Final  . Basophils Relative 11/02/2018 1  % Final  . Basophils Absolute 11/02/2018 0.1  0.0 - 0.1 K/uL Final  . Immature Granulocytes 11/02/2018 1  % Final  . Abs Immature Granulocytes 11/02/2018 0.06  0.00 - 0.07 K/uL Final   Performed at [facility], [address], [place], [place] [address]    Assessment:  Brenda Hawkins is a 53 y.o. female status post gastric bypass  surgery in 03/2002 and subsequent development of iron deficiency anemia and B12 deficiency.  She is intolerant of oral iron.  She denies any melena or hematochezia.  Stool guaiacs were negative on 01/02/2015.  Diet is poor.  She denies any melena or hematochezia.  She has never had a colonoscopy.  Patient refuses colonoscopy.  She has B12 deficiency.  B12 was 172 on 06/10/2014 and 250 on 12/31/2016.  She receives B12 injections (began 01/16/2015; last 10/09/2017).  Folate was 7.8 on 10/25/2017.  She has iron deficiency.  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  She received Venofer  X 4 (01/16/2015 - 02/07/2015), x 1 (10/10/2015), x 1 (05/06/2017), and x 3 (05/08/2017 - 05/22/2017), and x3 (02/09/2018 - 02/23/2018).   Ferritin has been followed: 1.5 on 09/19/2014, 59 on 02/07/2015, 10 on 03/16/2015, 6 on 06/16/2015, 5 on 10/09/2015, 5 on 01/01/2016, 6 on 04/01/2016, 14 on 07/08/2016, 13 on 08/06/2016, 5 on 05/07/2017, 35 on 08/08/2017, 6 on 11/04/2017, 7 on 02/03/2018, 19 on 05/05/2018, 16 on 08/04/2018, and 111 on 11/02/2018.  EGD on 06/01/2018 revealed patent Roux-en-Y gastrojejunostomy characterized by an intact staple line, visible sutures, congestion, erythema, friable mucosa and ulceration.  There was a large clean based gastrojejunal anastomotic ulcer.  There was a normal duodenal bulb and second portion of the duodenum. There was bleeding erosive gastropathy treated with bipolar cautery.  There was normal gastroesophageal junction and esophagus.  Biopsies were negative for malignancy.  There was no H pylori. She is on Nexium 40 mg BID.  Abdomen and pelvic CT on 10/23/2018 revealed moderate to severe right hydronephrosis due to 10 mm calculus at the right UPJ. There was moderate left hydronephrosis due to 4 mm calculus in distal left ureter and 3 mm calculus at left UVJ.  There was tiny bilateral intrarenal calculi.  There was cholelithiasis and colonic diverticulosis. She underwent  bilateral stent placement on 10/24/2018.  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  She denies any liver disease.  Thyroid studies have been normal in the past.  Labs on 07/08/2016 revealed the following normal labs:  folate (11.4), TSH (1.563), and reticulocyte count (1.7%).  Folate was 6.5 on 08/08/2017.  TSH was normal on 09/08/2017 and 05/05/2018.  She has a history of sleep apnea.  She previously used CPAP when she was heavier.  She is not using CPAP now.   She smokes 1 1/2 packs per day.  She has an increasing hematocrit despite low iron stores suggestive of secondary erythrocytosis due to smoking, sleep apnea, or a myeloproliferative disorder.  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  She was taking over 50 BC powders a week for chronic headaches.  She had tinnitus.  Salicylate level was 39.6 (2.8-30).  She was hydrated in the ER on 07/09/2016.  Follow-up salicylate level was 16.3.  Bilateral mammogram on 05/29/2017 revealed calcifications and possible asymmetry in the right breast.  Left breast was unremarkable.  Diagnostic mammogram and ultrasound of the right breast were ordered, but not performed.  Patient refuses further breast imaging.  Symptomatically, she has ongoing issues with nephrolithiasis.  Exam reveals right sided CVA tenderness and right sided abdominal discomfort.  Plan: 1.  Labs today:  CBC with diff, ferritin, B12, folate. 2.  Iron deficiency  Hematocrit 46.1.  Hemoglobin 16.4.  MCV 107.  Ferritin 111.  No IV iron today. 3.  B12 deficiency  B12 today and monthly. 4.  Macrocytic RBC indices  Etiology remains unclear.  Patient has no known liver disease or MDS.  TSH was normal on 05/05/2018.  She is currently receiving treatment for B12 deficiency.  Check B12 and folate today. 5.  Right CVA tenderness and abdominal pain  Etiology likely secondary to nephrolithiasis.  She has an appointment with urology on  11/04/2018.  Discuss referral to ER secondary to ongoing  pain and nausea.  Contact hospitalist- done. 6.   RTC in 4 weeks and 8 weeks for labs (CBC, ferritin), B12 7.   RTC in 12 weeks for MD assessment, labs (CBC with diff, BMP, ferritin), and B12.  I discussed the assessment and treatment plan with the patient.  The patient was provided an opportunity to ask questions and all were answered.  The patient agreed with the plan and demonstrated an understanding of the instructions.  The patient was advised to call back or seek an in person evaluation if the symptoms worsen or if the condition fails to improve as anticipated.    Rosey BathMelissa C , MD  11/02/2018, 10:12 AM

## 2018-11-02 ENCOUNTER — Inpatient Hospital Stay: Payer: 59

## 2018-11-02 ENCOUNTER — Telehealth: Payer: Self-pay

## 2018-11-02 ENCOUNTER — Other Ambulatory Visit: Payer: Self-pay

## 2018-11-02 ENCOUNTER — Emergency Department: Payer: 59

## 2018-11-02 ENCOUNTER — Inpatient Hospital Stay: Payer: 59 | Attending: Hematology and Oncology

## 2018-11-02 ENCOUNTER — Emergency Department
Admission: EM | Admit: 2018-11-02 | Discharge: 2018-11-02 | Disposition: A | Discharge status: Home / Self Care | Payer: 59 | Source: Home / Self Care | Attending: Emergency Medicine | Admitting: Emergency Medicine

## 2018-11-02 ENCOUNTER — Inpatient Hospital Stay (HOSPITAL_BASED_OUTPATIENT_CLINIC_OR_DEPARTMENT_OTHER): Payer: 59 | Admitting: Hematology and Oncology

## 2018-11-02 ENCOUNTER — Encounter: Payer: Self-pay | Admitting: Hematology and Oncology

## 2018-11-02 VITALS — BP 175/113 | HR 97 | Temp 99.1°F | Resp 18 | Ht 60.0 in

## 2018-11-02 DIAGNOSIS — E538 Deficiency of other specified B group vitamins: Secondary | ICD-10-CM

## 2018-11-02 DIAGNOSIS — K8020 Calculus of gallbladder without cholecystitis without obstruction: Secondary | ICD-10-CM | POA: Diagnosis not present

## 2018-11-02 DIAGNOSIS — N200 Calculus of kidney: Secondary | ICD-10-CM

## 2018-11-02 DIAGNOSIS — R109 Unspecified abdominal pain: Secondary | ICD-10-CM | POA: Insufficient documentation

## 2018-11-02 DIAGNOSIS — D500 Iron deficiency anemia secondary to blood loss (chronic): Secondary | ICD-10-CM | POA: Diagnosis not present

## 2018-11-02 DIAGNOSIS — E119 Type 2 diabetes mellitus without complications: Secondary | ICD-10-CM

## 2018-11-02 DIAGNOSIS — Z960 Presence of urogenital implants: Secondary | ICD-10-CM | POA: Diagnosis not present

## 2018-11-02 DIAGNOSIS — I10 Essential (primary) hypertension: Secondary | ICD-10-CM

## 2018-11-02 DIAGNOSIS — D7589 Other specified diseases of blood and blood-forming organs: Secondary | ICD-10-CM | POA: Diagnosis not present

## 2018-11-02 DIAGNOSIS — F17210 Nicotine dependence, cigarettes, uncomplicated: Secondary | ICD-10-CM | POA: Insufficient documentation

## 2018-11-02 DIAGNOSIS — J45909 Unspecified asthma, uncomplicated: Secondary | ICD-10-CM

## 2018-11-02 DIAGNOSIS — R1013 Epigastric pain: Secondary | ICD-10-CM | POA: Insufficient documentation

## 2018-11-02 DIAGNOSIS — D509 Iron deficiency anemia, unspecified: Secondary | ICD-10-CM

## 2018-11-02 DIAGNOSIS — D508 Other iron deficiency anemias: Secondary | ICD-10-CM

## 2018-11-02 LAB — COMPREHENSIVE METABOLIC PANEL
ALT: 14 U/L (ref 0–44)
AST: 17 U/L (ref 15–41)
Albumin: 4.4 g/dL (ref 3.5–5.0)
Alkaline Phosphatase: 70 U/L (ref 38–126)
Anion gap: 13 (ref 5–15)
BUN: 20 mg/dL (ref 6–20)
CO2: 26 mmol/L (ref 22–32)
Calcium: 10 mg/dL (ref 8.9–10.3)
Chloride: 102 mmol/L (ref 98–111)
Creatinine, Ser: 0.75 mg/dL (ref 0.44–1.00)
GFR calc Af Amer: >60 mL/min (ref >60)
GFR calc non Af Amer: >60 mL/min (ref >60)
Glucose, Bld: 120 mg/dL — ABNORMAL HIGH (ref 70–99)
POTASSIUM: 3.2 mmol/L — AB (ref 3.5–5.1)
Sodium: 141 mmol/L (ref 135–145)
Total Bilirubin: 0.4 mg/dL (ref 0.3–1.2)
Total Protein: 8.4 g/dL — ABNORMAL HIGH (ref 6.5–8.1)

## 2018-11-02 LAB — URINALYSIS, COMPLETE (UACMP) WITH MICROSCOPIC
Bacteria, UA: NONE SEEN
Bilirubin Urine: NEGATIVE
Glucose, UA: NEGATIVE mg/dL
Ketones, ur: 5 mg/dL — AB
Nitrite: NEGATIVE
PROTEIN: 30 mg/dL — AB
RBC / HPF: >50 RBC/hpf — ABNORMAL HIGH (ref 0–5)
Specific Gravity, Urine: 1.012 (ref 1.005–1.030)
pH: 7 (ref 5.0–8.0)

## 2018-11-02 LAB — CBC WITH DIFFERENTIAL/PLATELET
Abs Immature Granulocytes: 0.06 10*3/uL (ref 0.00–0.07)
Abs Immature Granulocytes: 0.06 10*3/uL (ref 0.00–0.07)
Basophils Absolute: 0.1 10*3/uL (ref 0.0–0.1)
Basophils Absolute: 0.1 10*3/uL (ref 0.0–0.1)
Basophils Relative: 1 %
Basophils Relative: 1 %
EOS ABS: 0.2 10*3/uL (ref 0.0–0.5)
Eosinophils Absolute: 0.2 10*3/uL (ref 0.0–0.5)
Eosinophils Relative: 3 %
Eosinophils Relative: 3 %
HCT: 46.1 % — ABNORMAL HIGH (ref 36.0–46.0)
HCT: 51.3 % — ABNORMAL HIGH (ref 36.0–46.0)
Hemoglobin: 16.4 g/dL — ABNORMAL HIGH (ref 12.0–15.0)
Hemoglobin: 17.6 g/dL — ABNORMAL HIGH (ref 12.0–15.0)
Immature Granulocytes: 1 %
Immature Granulocytes: 1 %
LYMPHS ABS: 1.1 10*3/uL (ref 0.7–4.0)
Lymphocytes Relative: 11 %
Lymphocytes Relative: 12 %
Lymphs Abs: 1 10*3/uL (ref 0.7–4.0)
MCH: 38.1 pg — ABNORMAL HIGH (ref 26.0–34.0)
MCH: 37.1 pg — ABNORMAL HIGH (ref 26.0–34.0)
MCHC: 35.6 g/dL (ref 30.0–36.0)
MCHC: 34.3 g/dL (ref 30.0–36.0)
MCV: 107 fL — ABNORMAL HIGH (ref 80.0–100.0)
MCV: 108 fL — ABNORMAL HIGH (ref 80.0–100.0)
Monocytes Absolute: 0.5 10*3/uL (ref 0.1–1.0)
Monocytes Absolute: 0.6 10*3/uL (ref 0.1–1.0)
Monocytes Relative: 6 %
Monocytes Relative: 7 %
Neutro Abs: 7.1 10*3/uL (ref 1.7–7.7)
Neutro Abs: 6.9 10*3/uL (ref 1.7–7.7)
Neutrophils Relative %: 78 %
Neutrophils Relative %: 76 %
Platelets: 278 10*3/uL (ref 150–400)
Platelets: 290 10*3/uL (ref 150–400)
RBC: 4.31 MIL/uL (ref 3.87–5.11)
RBC: 4.75 MIL/uL (ref 3.87–5.11)
RDW: 12.2 % (ref 11.5–15.5)
RDW: 12.1 % (ref 11.5–15.5)
WBC: 8.9 10*3/uL (ref 4.0–10.5)
WBC: 9 10*3/uL (ref 4.0–10.5)
nRBC: 0 % (ref 0.0–0.2)
nRBC: 0 % (ref 0.0–0.2)

## 2018-11-02 LAB — FOLATE: Folate: 12.8 ng/mL (ref >5.9)

## 2018-11-02 LAB — VITAMIN B12: Vitamin B-12: 1400 pg/mL — ABNORMAL HIGH (ref 180–914)

## 2018-11-02 LAB — FERRITIN: Ferritin: 111 ng/mL (ref 11–307)

## 2018-11-02 MED ORDER — OXYCODONE-ACETAMINOPHEN 5-325 MG PO TABS: 1.0000 | ORAL_TABLET | ORAL | 0 refills | Status: DC | PRN

## 2018-11-02 MED ORDER — OXYCODONE-ACETAMINOPHEN 5-325 MG PO TABS
1.0000 | ORAL_TABLET | Freq: Once | ORAL | Status: AC
  Administered 2018-11-02: 1 via ORAL
  Filled 2018-11-02: qty 1

## 2018-11-02 MED ORDER — SODIUM CHLORIDE 0.9 % IV BOLUS
1000.0000 mL | Freq: Once | INTRAVENOUS | Status: AC
  Administered 2018-11-02: 1000 mL via INTRAVENOUS

## 2018-11-02 MED ORDER — ONDANSETRON HCL 4 MG PO TABS: 4.0000 mg | ORAL_TABLET | Freq: Every day | ORAL | 0 refills | Status: DC | PRN

## 2018-11-02 MED ORDER — TAMSULOSIN HCL 0.4 MG PO CAPS: 0.4000 mg | ORAL_CAPSULE | Freq: Every day | ORAL | 3 refills | Status: DC

## 2018-11-02 MED ORDER — CYANOCOBALAMIN 1000 MCG/ML IJ SOLN
1000.0000 ug | Freq: Once | INTRAMUSCULAR | Status: AC
  Administered 2018-11-02: 1000 ug via INTRAMUSCULAR

## 2018-11-02 MED ORDER — ONDANSETRON HCL 4 MG/2ML IJ SOLN
4.0000 mg | Freq: Once | INTRAMUSCULAR | Status: AC
  Administered 2018-11-02: 4 mg via INTRAVENOUS
  Filled 2018-11-02: qty 2

## 2018-11-02 MED ORDER — SODIUM CHLORIDE 0.9 % IV SOLN
Freq: Once | INTRAVENOUS | Status: AC
  Administered 2018-11-02: 12:00:00 via INTRAVENOUS

## 2018-11-02 MED ORDER — HYDROMORPHONE HCL 1 MG/ML IJ SOLN
1.0000 mg | Freq: Once | INTRAMUSCULAR | Status: AC
  Administered 2018-11-02: 1 mg via INTRAVENOUS
  Filled 2018-11-02: qty 1

## 2018-11-02 MED ORDER — MORPHINE SULFATE (PF) 4 MG/ML IV SOLN
4.0000 mg | Freq: Once | INTRAVENOUS | Status: AC
  Administered 2018-11-02: 4 mg via INTRAVENOUS
  Filled 2018-11-02: qty 1

## 2018-11-02 NOTE — Progress Notes (Signed)
Patient c/o pain noted lower back up under bilateral arm pits. She was recently hospitalized with urinated stent placed last week. The patient states she has a kidney stone which has not passed yet. Pain level 10 today with nausea / no vomiting

## 2018-11-02 NOTE — Discharge Instructions (Signed)
Return to the emergency room for any new or worrisome symptoms including increased pain, vomiting, fever or other concerns.  Follow-up closely with urology in the next day or 2 as planned.

## 2018-11-02 NOTE — Telephone Encounter (Signed)
Informed patient of improved Ferritin levels. Patient happy with results and denies any further questions or concerns. Reminded patient of upcoming appts. Patient states she will review them in MyChart and call back if she has any questions.

## 2018-11-02 NOTE — ED Provider Notes (Addendum)
[facility] Emergency Department Provider Note  ____________________________________________   I have reviewed the triage vital signs and the nursing notes. Where available I have reviewed prior notes and, if possible and indicated, outside hospital notes.    HISTORY  Chief Complaint Flank Pain    HPI Brenda Hawkins is a 53 y.o. female with a history of kidney stones, had stents placed by Dr. Alvester Morin on the 15th of this month for a large kidney stone, she had lithotripsy etc.  Patient states that since that time she has had intolerable pain, she has been taking pain medications at home but is not helping.  She states that she has had no fever or vomiting.  She denies any abdominal pain the pain is mostly in the right back.  She states that she was sent over here because the pain is uncontrollable.  She received Dilaudid and has more comfortable but still has pain.  And expresses her discontent with her ongoing discomfort.  He is taking Toradol she states at home which is insufficient to manage her discomfort.    Past Medical History:  Diagnosis Date  . Anemia   . Anxiety   . Asthma   . Diabetes mellitus type II   . GERD (gastroesophageal reflux disease)   . Hypertension   . Obesity   . Osteopenia   . Sleep apnea   . 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 Vitamin B12 deficiency    since bariatric surgery    Patient Active Problem List   Diagnosis Date Noted  . Right nephrolithiasis 10/24/2018  . Itching 10/15/2018  . Epigastric pain   . Erythrocytosis 11/04/2017  . Breast asymmetry 11/04/2017  . Macrocytosis 11/04/2017  . B12 deficiency 08/28/2016  . Salicylate poisoning 07/08/2016  . Peripheral edema 03/24/2014  . HYPERCHOLESTEROLEMIA 08/11/2008  . Anemia, iron deficiency 08/11/2008  . Diabetes mellitus type II, controlled (HCC) 06/02/2007  . Osteopenia 06/02/2007  . OBESITY 12/19/2006  . ANEMIA, VITAMIN B12 DEFICIENCY 12/19/2006  . ANXIETY 12/19/2006  .  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 Essential hypertension, benign 12/19/2006  . ASTHMA 12/19/2006  . GERD 12/19/2006  . SLEEP APNEA 12/19/2006    Past Surgical History:  Procedure Laterality Date  . CYSTOSCOPY WITH HOLMIUM LASER LITHOTRIPSY Left 10/24/2018   Procedure: CYSTOSCOPY WITH HOLMIUM LASER LITHOTRIPSy;  Surgeon: Crista Elliot, MD;  Location: [facility] ORS;  Service: Urology;  Laterality: Left;  . CYSTOSCOPY WITH STENT PLACEMENT Bilateral 10/24/2018   Procedure: CYSTOSCOPY WITH STENT PLACEMENT;  Surgeon: Crista Elliot, MD;  Location: [facility] ORS;  Service: Urology;  Laterality: Bilateral;  . ESOPHAGOGASTRODUODENOSCOPY (EGD) WITH PROPOFOL N/A 06/01/2018   Procedure: ESOPHAGOGASTRODUODENOSCOPY (EGD) WITH PROPOFOL;  Surgeon: Toney Reil, MD;  Location: [facility] ENDOSCOPY;  Service: Gastroenterology;  Laterality: N/A;  . 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 REDUCTION MAMMAPLASTY Right 1992  . URETEROSCOPY Left 10/24/2018   Procedure: URETEROSCOPY;  Surgeon: Crista Elliot, MD;  Location: [facility] ORS;  Service: Urology;  Laterality: Left;    Prior to Admission medications   Medication Sig Start Date End Date Taking? Authorizing Provider  acetaminophen (TYLENOL) 500 MG tablet Take 500 mg by mouth every 6 (six) hours as needed.    [provider]  ciprofloxacin (CIPRO) 500 MG tablet Take 1 tablet (500 mg total) by mouth 2 (two) times daily. Patient not taking: Reported on 11/02/2018 10/25/18   Katha Hamming, MD  ketorolac (TORADOL) 10 MG tablet Take 1 tablet (10 mg  total) by mouth every 8 (eight) hours. 10/25/18   Katha Hamming, MD  ondansetron (ZOFRAN) 4 MG tablet Take 1 tablet (4 mg total) by mouth every 6 (six) hours as needed for nausea. 10/25/18   Katha Hamming, MD    Allergies Codeine sulfate and Penicillins  Family History  Problem Relation Age of Onset  . Heart disease Mother        valve replacement surgery  . Coronary artery disease Father    . Breast cancer Neg Hx     Social History Social History   Tobacco Use  . Smoking status: Current Every Day Smoker    Packs/day: 1.00  . Smokeless tobacco: Never Used  Substance Use Topics  . Alcohol use: No  . Drug use: No    Review of Systems Constitutional: No fever/chills Eyes: No visual changes. ENT: No sore throat. No stiff neck no neck pain Cardiovascular: Denies chest pain. Respiratory: Denies shortness of breath. Gastrointestinal:   no vomiting.  No diarrhea.  No constipation. Genitourinary: Negative for dysuria. Musculoskeletal: Negative lower extremity swelling Skin: Negative for rash. Neurological: Negative for severe headaches, focal weakness or numbness.   ____________________________________________   PHYSICAL EXAM:  VITAL SIGNS: ED Triage Vitals  Enc Vitals Group     BP 11/02/18 1158 (!) 186/150     Pulse Rate 11/02/18 1158 82     Resp 11/02/18 1158 18     Temp 11/02/18 1158 98 F (36.7 C)     Temp Source 11/02/18 1158 Oral     SpO2 11/02/18 1158 99 %     Weight 11/02/18 1159 144 lb 6.4 oz (65.5 kg)     Height 11/02/18 1159 5' (1.524 m)     Head Circumference --      Peak Flow --      Pain Score 11/02/18 1158 10     Pain Loc --      Pain Edu? --      Excl. in GC? --     Constitutional: Alert and oriented. Well appearing and in no acute distress.  Patient is anxious. Eyes: Conjunctivae are normal Head: Atraumatic HEENT: No congestion/rhinnorhea. Mucous membranes are moist.  Oropharynx non-erythematous Neck:   Nontender with no meningismus, no masses, no stridor Cardiovascular: Normal rate, regular rhythm. Grossly normal heart sounds.  Good peripheral circulation. Respiratory: Normal respiratory effort.  No retractions. Lungs CTAB. Abdominal: Soft and nontender. No distention. No guarding no rebound Back:  There is no focal tenderness or step off.  there is no midline tenderness there are no lesions noted. there is right CVA  tenderness Musculoskeletal: No lower extremity tenderness, no upper extremity tenderness. No joint effusions, no DVT signs strong distal pulses no edema Neurologic:  Normal speech and language. No gross focal neurologic deficits are appreciated.  Skin:  Skin is warm, dry and intact. No rash noted. Psychiatric: Mood and affect are normal. Speech and behavior are normal.  ____________________________________________   LABS (all labs ordered are listed, but only abnormal results are displayed)  Labs Reviewed  CBC WITH DIFFERENTIAL/PLATELET - Abnormal; Notable for the following components:      Result Value   Hemoglobin 17.6 (*)    HCT 51.3 (*)    MCV 108.0 (*)    MCH 37.1 (*)    All other components within normal limits  COMPREHENSIVE METABOLIC PANEL - Abnormal; Notable for the following components:   Potassium 3.2 (*)    Glucose, Bld 120 (*)    Total Protein 8.4 (*)  All other components within normal limits  URINALYSIS, COMPLETE (UACMP) WITH MICROSCOPIC - Abnormal; Notable for the following components:   Color, Urine YELLOW (*)    APPearance HAZY (*)    Hgb urine dipstick LARGE (*)    Ketones, ur 5 (*)    Protein, ur 30 (*)    Leukocytes,Ua SMALL (*)    RBC / HPF >50 (*)    All other components within normal limits    Pertinent labs  results that were available during my care of the patient were reviewed by me and considered in my medical decision making (see chart for details). ____________________________________________  EKG  I personally interpreted any EKGs ordered by me or triage  ____________________________________________  RADIOLOGY  Pertinent labs & imaging results that were available during my care of the patient were reviewed by me and considered in my medical decision making (see chart for details). If possible, patient and/or family made aware of any abnormal findings.  Ct Renal Stone Study  Result Date: 11/02/2018 CLINICAL DATA:  Abdominal pain with  recent ureteral stent placements EXAM: CT ABDOMEN AND PELVIS WITHOUT CONTRAST TECHNIQUE: Multidetector CT imaging of the abdomen and pelvis was performed following the standard protocol without oral or IV contrast. COMPARISON:  October 23, 2018 FINDINGS: Lower chest: Lung bases are clear. There is a small hiatal hernia. There are apparent foci of coronary artery calcification. Hepatobiliary: No focal liver lesions are appreciable. There is a Riedel's lobe on the right, an anatomic variant. There are multiple gallstones within the gallbladder. Gallbladder wall is not appreciably thickened. There is no appreciable biliary duct dilatation. Pancreas: No pancreatic mass or inflammatory focus is evident. Spleen: No splenic lesions are appreciable. Adrenals/Urinary Tract: There is adrenal hypertrophy bilaterally. There is a 6 mm cyst arising from the posterior mid left kidney. There is slight fullness of each renal collecting system, considerably less than on most recent study. There is a double-J stent on the right which extends from the right renal pelvis to the bladder. There is a stent on the left extending from the distal renal pelvis on the left to the bladder. There are 2-3 mm calculi in the lower pole the right kidney. There are occasional 1 mm calculi in the upper to mid left kidney. There is an apparent calculus in the proximal right ureter at the L3-4 level immediately adjacent to the double-J stent measuring 5 x 4 mm, best appreciated on axial slice 36 series 2 and coronal slice 73 series 5. No other ureteral calculi are evident. It must be noted that the double-J stents could easily mask ureteral calculi. Urinary bladder is midline. Urinary bladder wall thickness is felt to be within normal limits for nearly empty state. Stomach/Bowel: There are sigmoid diverticula without diverticulitis. There is no appreciable bowel wall or mesenteric thickening. There is evidence of previous gastric bypass procedure  without wall thickening or fluid in the postoperative areas. No evident bowel obstruction. There is no free air or portal venous air. Vascular/Lymphatic: There are foci of aortic atherosclerosis. No aneurysm evident. There is no appreciable calcification in major mesenteric arterial vessels beyond mild calcification in the proximal left renal artery. No adenopathy is evident in the abdomen or pelvis. Reproductive: The uterus is anteverted. No pelvic masses appreciable. Other: Appendix appears normal. No abscess or ascites is evident in the abdomen or pelvis. Musculoskeletal: No blastic or lytic bone lesions. No intramuscular or abdominal wall lesion evident. IMPRESSION: 1. Double-J stents present bilaterally with proximal aspect of  the right stent in the right renal pelvis and the proximal aspect of the left stent in the distal left renal pelvis. Each stent terminates in the bladder. 2. There is a calculus at the L3-4 level immediately adjacent to the stent on the right measuring 5 x 4 mm. No other ureteral calculus evident currently on either side. Note that the stents could mask small ureteral calculi. 3. Small intrarenal calculi noted bilaterally, nonobstructing. 4. Cholelithiasis.  No gallbladder wall thickening evident. 5. Status post gastric bypass procedure without complicating features. There are sigmoid diverticula without diverticulitis. No bowel obstruction. No abscess in the abdomen or pelvis. Appendix appears normal. 6. Adrenal hypertrophy bilaterally. 7. Aortic atherosclerosis. 8. Small hiatal hernia. Electronically Signed   By: Bretta Bang III M.D.   On: 11/02/2018 12:03   ____________________________________________    PROCEDURES  Procedure(s) performed: None  Procedures  Critical Care performed: None  ____________________________________________   INITIAL IMPRESSION / ASSESSMENT AND PLAN / ED COURSE  Pertinent labs & imaging results that were available during my care of the  patient were reviewed by me and considered in my medical decision making (see chart for details).  Seen in no acute distress medically, she did receive pain medication I will give some more a talk to Dr. Alvester Morin about her care.  He states there is no further intervention needed we talked about her urinalysis, her CT scan, her kidney stone, her prior procedure, her blood work including her creatinine etc.  He states that it is okay to advance her pain medication to Percocet at home and he will see her in the office.  ----------------------------------------- 5:29 PM on 11/02/2018 ----------------------------------------- And I had a long discussion about all the CT findings both of today and prior CT, as well as her kidney function test her urinalysis, her CBC etc.  There is no evidence of infected stone, her abdomen is benign, CT scan is reassuring she has no right upper quadrant abdominal pain, she has right flank pain.  Nothing suggest cholecystitis or other acute pathology causing her pain.  Patient has had this pain nonstop for 2 weeks.  She has an appointment after tomorrow with her urologist.  She is angry that the urologist is not present at bedside to explain all of the findings to her.  I have explained that the best I can.  The patient is eager to go home at this time.  She is angry that she still has a stone in there.  Expresses anger about the fact that she still has kidney pain despite the procedure.  I have explained her that I can only do what I can I am happy to advance her pain medication at home and she can return to the emergency room if she feels worse, and if we have uncontrolled pain she can be admitted.  However, at this time she is in no acute distress and she is eager to leave.  She does accept an offer of pain medication.  I did give her Percocet here, there is no evidence of pyelonephritis, there is no evidence of allergic reaction to the Percocet and patient is demanding discharge.   We will discharge her with close outpatient follow-up with urologist.  Also talked about admission for pain control and she states she wants to go home. She has no known history of anaphylaxis has vomited to codein but tolerated ms04 and Dilaudid here with no side effects     ____________________________________________   FINAL CLINICAL IMPRESSION(S) /  ED DIAGNOSES  Final diagnoses:  None      This chart was dictated using voice recognition software.  Despite best efforts to proofread,  errors can occur which can change meaning.      Jeanmarie PlantMcShane, James A, MD 11/02/18 1710    Jeanmarie PlantMcShane, James A, MD 11/02/18 1731    Jeanmarie PlantMcShane, James A, MD 11/02/18 [phone]

## 2018-11-02 NOTE — ED Notes (Signed)
Urine sample sent to lab

## 2018-11-02 NOTE — Telephone Encounter (Signed)
-----   Message from Rosey Bath, MD sent at 11/02/2018  3:35 PM EST ----- Regarding: Please call patient  Call with lab results.  Ferritin is good!  M ----- Message ----- From: Leory Plowman, Lab In [place] Sent: 11/02/2018   9:30 AM EST To: Rosey Bath, MD

## 2018-11-02 NOTE — Consult Note (Signed)
H&P Physician requesting consult: Ileana RoupJames McShane, MD  Chief Complaint: Renal colic secondary to stent  History of Present Illness: 53 year old female status post left ureteroscopy with laser lithotripsy and ureteral stent placement as well as a right ureteral stent placement (no lithotripsy on the right) for bilateral obstructing ureteral calculi presents for evaluation.  She has been having quite a bit of pain and nausea at home.  The pain radiates to her right shoulder and she also has abdominal pain.  Her pain is currently controlled after receiving IV narcotics.  She has been taking Toradol at home.  She has a scheduled appointment with Dr. Richardo HanksSninsky on Wednesday.  She had a CT scan of the abdomen and pelvis performed which revealed bilateral stents in good position.  No hydronephrosis bilaterally.  She had persistent right proximal ureteral calculus as anticipated.  She denies any fever, vomiting.  She has been having some nausea.  She is very frustrated today about her clinical situation and confusion regarding her management.  She has been having some gross hematuria.  Hemoglobin is 17.6.  She has no leukocytosis.  Creatinine is normal at 0.75.  Urinalysis shows greater than 50 RBCs, 21-50 WBCs, no bacteria, negative nitrite which are consistent with stent presence.   Past Medical History:  Diagnosis Date  . Anemia   . Anxiety   . Asthma   . Diabetes mellitus type II   . GERD (gastroesophageal reflux disease)   . Hypertension   . Obesity   . Osteopenia   . Sleep apnea   . 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 Vitamin B12 deficiency    since bariatric surgery   Past Surgical History:  Procedure Laterality Date  . CYSTOSCOPY WITH HOLMIUM LASER LITHOTRIPSY Left 10/24/2018   Procedure: CYSTOSCOPY WITH HOLMIUM LASER LITHOTRIPSy;  Surgeon: Crista ElliotBell, Eugene D III, MD;  Location: [facility] ORS;  Service: Urology;  Laterality: Left;  . CYSTOSCOPY WITH STENT PLACEMENT Bilateral 10/24/2018   Procedure: CYSTOSCOPY WITH STENT  PLACEMENT;  Surgeon: Crista ElliotBell, Eugene D III, MD;  Location: [facility] ORS;  Service: Urology;  Laterality: Bilateral;  . ESOPHAGOGASTRODUODENOSCOPY (EGD) WITH PROPOFOL N/A 06/01/2018   Procedure: ESOPHAGOGASTRODUODENOSCOPY (EGD) WITH PROPOFOL;  Surgeon: Toney ReilVanga, Rohini Reddy, MD;  [facility];  Service: Gastroenterology;  Laterality: N/A;  . 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 REDUCTION MAMMAPLASTY Right 1992  . URETEROSCOPY Left 10/24/2018   Procedure: URETEROSCOPY;  Surgeon: Crista ElliotBell, Eugene D III, MD;  Location: [facility] ORS;  Service: Urology;  Laterality: Left;    Home Medications:  (Not in a hospital admission)  Allergies:  Allergies  Allergen Reactions  . Codeine Sulfate Other (See Comments)    Reaction: "violently ill"  . Penicillins Other (See Comments)    Reaction: Unsure   Has patient had a PCN reaction causing immediate rash, facial/tongue/throat swelling, SOB or lightheadedness with hypotension: No Has patient had a PCN reaction causing severe rash involving mucus membranes or skin necrosis: No Has patient had a PCN reaction that required hospitalization No Has patient had a PCN reaction occurring within the last 10 years: No If all of the above answers are "NO", then may proceed with Cephalosporin use.     Family History  Problem Relation Age of Onset  . Heart disease Mother        valve replacement surgery  . Coronary artery disease Father   . Breast cancer Neg Hx    Social History:  reports that she has been smoking. She has been smoking about 1.00 pack per  day. She has never used smokeless tobacco. She reports that she does not drink alcohol or use drugs.  ROS: A complete review of systems was performed.  All systems are negative except for pertinent findings as noted. ROS   Physical Exam:  Vital signs in last 24 hours: Temp:  [98 F (36.7 C)-99.1 F (37.3 C)] 98.1 F (36.7 C) (02/24 1740) Pulse Rate:  [67-97] 81 (02/24 1740) Resp:  [18] 18 (02/24 1158) BP:  (153-186)/(79-150) 184/94 (02/24 1740) SpO2:  [95 %-100 %] 96 % (02/24 1740) Weight:  [65.5 kg] 65.5 kg (02/24 1159) General:  Alert and oriented, No acute distress HEENT: Normocephalic, atraumatic Neck: No JVD or lymphadenopathy Cardiovascular: Regular rate and rhythm Lungs: Regular rate and effort Abdomen: Soft, nontender, nondistended, no abdominal masses Back: No CVA tenderness Extremities: No edema Neurologic: Grossly intact  Laboratory Data:  Results for orders placed or performed during the hospital encounter of 11/02/18 (from the past 24 hour(s))  CBC with Differential/Platelet     Status: Abnormal   Collection Time: 11/02/18 12:13 PM  Result Value Ref Range   WBC 9.0 4.0 - 10.5 K/uL   RBC 4.75 3.87 - 5.11 MIL/uL   Hemoglobin 17.6 (H) 12.0 - 15.0 g/dL   HCT 51.7 (H) 61.6 - 07.3 %   MCV 108.0 (H) 80.0 - 100.0 fL   MCH 37.1 (H) 26.0 - 34.0 pg   MCHC 34.3 30.0 - 36.0 g/dL   RDW 71.0 62.6 - 94.8 %   Platelets 290 150 - 400 K/uL   nRBC 0.0 0.0 - 0.2 %   Neutrophils Relative % 76 %   Neutro Abs 6.9 1.7 - 7.7 K/uL   Lymphocytes Relative 12 %   Lymphs Abs 1.1 0.7 - 4.0 K/uL   Monocytes Relative 7 %   Monocytes Absolute 0.6 0.1 - 1.0 K/uL   Eosinophils Relative 3 %   Eosinophils Absolute 0.2 0.0 - 0.5 K/uL   Basophils Relative 1 %   Basophils Absolute 0.1 0.0 - 0.1 K/uL   Immature Granulocytes 1 %   Abs Immature Granulocytes 0.06 0.00 - 0.07 K/uL  Comprehensive metabolic panel     Status: Abnormal   Collection Time: 11/02/18 12:13 PM  Result Value Ref Range   Sodium 141 135 - 145 mmol/L   Potassium 3.2 (L) 3.5 - 5.1 mmol/L   Chloride 102 98 - 111 mmol/L   CO2 26 22 - 32 mmol/L   Glucose, Bld 120 (H) 70 - 99 mg/dL   BUN 20 6 - 20 mg/dL   Creatinine, Ser 5.46 0.44 - 1.00 mg/dL   Calcium 27.0 8.9 - 35.0 mg/dL   Total Protein 8.4 (H) 6.5 - 8.1 g/dL   Albumin 4.4 3.5 - 5.0 g/dL   AST 17 15 - 41 U/L   ALT 14 0 - 44 U/L   Alkaline Phosphatase 70 38 - 126 U/L   Total  Bilirubin 0.4 0.3 - 1.2 mg/dL   GFR calc non Af Amer >60 >60 mL/min   GFR calc Af Amer >60 >60 mL/min   Anion gap 13 5 - 15  Urinalysis, Complete w Microscopic     Status: Abnormal   Collection Time: 11/02/18  3:25 PM  Result Value Ref Range   Color, Urine YELLOW (A) YELLOW   APPearance HAZY (A) CLEAR   Specific Gravity, Urine 1.012 1.005 - 1.030   pH 7.0 5.0 - 8.0   Glucose, UA NEGATIVE NEGATIVE mg/dL   Hgb urine dipstick LARGE (A)  NEGATIVE   Bilirubin Urine NEGATIVE NEGATIVE   Ketones, ur 5 (A) NEGATIVE mg/dL   Protein, ur 30 (A) NEGATIVE mg/dL   Nitrite NEGATIVE NEGATIVE   Leukocytes,Ua SMALL (A) NEGATIVE   RBC / HPF >50 (H) 0 - 5 RBC/hpf   WBC, UA 21-50 0 - 5 WBC/hpf   Bacteria, UA NONE SEEN NONE SEEN   Squamous Epithelial / LPF 0-5 0 - 5   No results found for this or any previous visit (from the past 240 hour(s)). Creatinine: Recent Labs    11/02/18 1213  CREATININE 0.75   CT scan personally reviewed and detailed in the history of present illness.  Impression/Assessment:  Ureteral calculi Renal colic Gross hematuria  Plan:  Agree with discharge home with opioid pain medication to help with her stent pain.  She can also take Flomax daily to help with stent discomfort.  She will follow-up in 2 days with urology for scheduling of left ureteral stent removal and right ureteroscopy with laser lithotripsy and ureteral stent exchange.  Agree with urine culture.  Ray Church, III 11/02/2018, 6:08 PM

## 2018-11-02 NOTE — Patient Instructions (Signed)
Cyanocobalamin, Vitamin B12 injection What is this medicine? CYANOCOBALAMIN (sye an oh koe BAL a min) is a man made form of vitamin B12. Vitamin B12 is used in the growth of healthy blood cells, nerve cells, and proteins in the body. It also helps with the metabolism of fats and carbohydrates. This medicine is used to treat people who can not absorb vitamin B12. This medicine may be used for other purposes; ask your health care provider or pharmacist if you have questions. COMMON BRAND NAME(S): B-12 Compliance Kit, B-12 Injection Kit, Cyomin, LA-12, Nutri-Twelve, Physicians EZ Use B-12, Primabalt What should I tell my health care provider before I take this medicine? They need to know if you have any of these conditions: -kidney disease -Leber's disease -megaloblastic anemia -an unusual or allergic reaction to cyanocobalamin, cobalt, other medicines, foods, dyes, or preservatives -pregnant or trying to get pregnant -breast-feeding How should I use this medicine? This medicine is injected into a muscle or deeply under the skin. It is usually given by a health care professional in a clinic or doctor's office. However, your doctor may teach you how to inject yourself. Follow all instructions. Talk to your pediatrician regarding the use of this medicine in children. Special care may be needed. Overdosage: If you think you have taken too much of this medicine contact a poison control center or emergency room at once. NOTE: This medicine is only for you. Do not share this medicine with others. What if I miss a dose? If you are given your dose at a clinic or doctor's office, call to reschedule your appointment. If you give your own injections and you miss a dose, take it as soon as you can. If it is almost time for your next dose, take only that dose. Do not take double or extra doses. What may interact with this medicine? -colchicine -heavy alcohol intake This list may not describe all possible  interactions. Give your health care provider a list of all the medicines, herbs, non-prescription drugs, or dietary supplements you use. Also tell them if you smoke, drink alcohol, or use illegal drugs. Some items may interact with your medicine. What should I watch for while using this medicine? Visit your doctor or health care professional regularly. You may need blood work done while you are taking this medicine. You may need to follow a special diet. Talk to your doctor. Limit your alcohol intake and avoid smoking to get the best benefit. What side effects may I notice from receiving this medicine? Side effects that you should report to your doctor or health care professional as soon as possible: -allergic reactions like skin rash, itching or hives, swelling of the face, lips, or tongue -blue tint to skin -chest tightness, pain -difficulty breathing, wheezing -dizziness -red, swollen painful area on the leg Side effects that usually do not require medical attention (report to your doctor or health care professional if they continue or are bothersome): -diarrhea -headache This list may not describe all possible side effects. Call your doctor for medical advice about side effects. You may report side effects to FDA at 1-800-FDA-1088. Where should I keep my medicine? Keep out of the reach of children. Store at room temperature between 15 and 30 degrees C (59 and 85 degrees F). Protect from light. Throw away any unused medicine after the expiration date. NOTE: This sheet is a summary. It may not cover all possible information. If you have questions about this medicine, talk to your doctor, pharmacist, or   health care provider.  2019 Elsevier/Gold Standard (2007-12-07 22:10:20)  

## 2018-11-02 NOTE — ED Notes (Signed)
EDP McShane notified of pt's request for pain/nausea meds. Pt laying in bed. Makes statements of discomfort and pain. Husband at bedside.

## 2018-11-02 NOTE — ED Triage Notes (Signed)
Pt c/o right flank pain for 2-3 weeks, had lithotripsy on 2/14, states it has worsened, pain has been severe, pt states she took 2 tylenol and a pain pill this morning with no relief.

## 2018-11-03 ENCOUNTER — Ambulatory Visit: Payer: 59

## 2018-11-03 ENCOUNTER — Ambulatory Visit: Payer: 59 | Admitting: Oncology

## 2018-11-03 ENCOUNTER — Other Ambulatory Visit: Payer: 59

## 2018-11-04 ENCOUNTER — Ambulatory Visit (INDEPENDENT_AMBULATORY_CARE_PROVIDER_SITE_OTHER): Payer: 59 | Admitting: Urology

## 2018-11-04 ENCOUNTER — Encounter: Payer: Self-pay | Admitting: Urology

## 2018-11-04 VITALS — BP 166/83 | HR 96 | Ht 60.0 in | Wt 149.0 lb

## 2018-11-04 DIAGNOSIS — N201 Calculus of ureter: Secondary | ICD-10-CM

## 2018-11-04 LAB — URINE CULTURE: Culture: NO GROWTH

## 2018-11-04 MED ORDER — OXYBUTYNIN CHLORIDE ER 10 MG PO TB24: 10.0000 mg | ORAL_TABLET | Freq: Every day | ORAL | 0 refills | Status: DC

## 2018-11-04 MED ORDER — ONDANSETRON HCL 4 MG PO TABS: 4.0000 mg | ORAL_TABLET | Freq: Three times a day (TID) | ORAL | 0 refills | Status: DC | PRN

## 2018-11-04 MED ORDER — OXYCODONE-ACETAMINOPHEN 5-325 MG PO TABS: 1.0000 | ORAL_TABLET | ORAL | 0 refills | Status: DC | PRN

## 2018-11-04 NOTE — H&P (View-Only) (Signed)
11/04/2018 11:47 AM   [name] 12-15-1965 [id number]  Referring provider: Excell Seltzer, MD [address] [place], [place] [address]  CC: Bilateral flank pain  HPI: I saw Ms. Hamidi today in urology clinic for follow-up after ureteroscopy and stent placement.  Briefly, she is a 53 year old female that was originally seen on 10/24/2018 by my colleague Dr. Alvester Morin on call with bilateral ureteral stones and flank pain.  She had no clinical or laboratory signs of infection or sepsis at that time.  She underwent left ureteroscopy and basket extraction of distal ureteral stones and left ureteral stent placement, and a right ureteral stent was passed for inability to access the collecting system.  The plan was for follow-up ureteroscopy to treat her residual stone burden on the right side.    She was seen in the emergency department on 11/02/2018 with severe bilateral flank pain consistent with stent related symptoms.  Urine culture showed no growth, and stents were in appropriate position on CT scan.  There was no significant left-sided stone burden.  The right side was notable for a 1 cm right UPJ stone, and scattered small lower pole stones.  She continues to have severe bilateral flank pain as well as urgency, frequency, and dysuria secondary to her bilateral ureteral stents.  She denies any fevers or chills.  She is taking narcotics, Toradol, Zofran, and Flomax for stent symptoms.  She has a history of gastric bypass.  PMH: Past Medical History:  Diagnosis Date  . Anemia   . Anxiety   . Asthma   . Diabetes mellitus type II   . GERD (gastroesophageal reflux disease)   . Hypertension   . Obesity   . Osteopenia   . Sleep apnea   . 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 Vitamin B12 deficiency    since bariatric surgery    Surgical History: Past Surgical History:  Procedure Laterality Date  . CYSTOSCOPY WITH HOLMIUM LASER LITHOTRIPSY Left 10/24/2018   Procedure: CYSTOSCOPY WITH HOLMIUM  LASER LITHOTRIPSy;  Surgeon: Crista Elliot, MD;  Location: [facility] ORS;  Service: Urology;  Laterality: Left;  . CYSTOSCOPY WITH STENT PLACEMENT Bilateral 10/24/2018   Procedure: CYSTOSCOPY WITH STENT PLACEMENT;  Surgeon: Crista Elliot, MD;  Location: [facility] ORS;  Service: Urology;  Laterality: Bilateral;  . ESOPHAGOGASTRODUODENOSCOPY (EGD) WITH PROPOFOL N/A 06/01/2018   Procedure: ESOPHAGOGASTRODUODENOSCOPY (EGD) WITH PROPOFOL;  Surgeon: Toney Reil, MD;  Location: [facility] ENDOSCOPY;  Service: Gastroenterology;  Laterality: N/A;  . 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 REDUCTION MAMMAPLASTY Right 1992  . URETEROSCOPY Left 10/24/2018   Procedure: URETEROSCOPY;  Surgeon: Crista Elliot, MD;  Location: [facility] ORS;  Service: Urology;  Laterality: Left;    Allergies:  Allergies  Allergen Reactions  . Codeine Sulfate Other (See Comments)    Reaction: "violently ill"  . Penicillins Other (See Comments)    Reaction: Unsure   Has patient had a PCN reaction causing immediate rash, facial/tongue/throat swelling, SOB or lightheadedness with hypotension: No Has patient had a PCN reaction causing severe rash involving mucus membranes or skin necrosis: No Has patient had a PCN reaction that required hospitalization No Has patient had a PCN reaction occurring within the last 10 years: No If all of the above answers are "NO", then may proceed with Cephalosporin use.     Family History: Family History  Problem Relation Age of Onset  . Heart disease Mother        valve replacement surgery  .  Coronary artery disease Father   . Breast cancer Neg Hx     Social History:  reports that she has been smoking. She has been smoking about 1.00 pack per day. She has never used smokeless tobacco. She reports that she does not drink alcohol or use drugs.  ROS: Please see flowsheet from today's date for complete review of systems.  Physical Exam: BP (!) 166/83 (BP Location: Left Arm, Patient Position: Sitting,  Cuff Size: Normal)   Pulse 96   Ht 5' (1.524 m)   Wt 149 lb (67.6 kg)   BMI 29.10 kg/m    Constitutional: Appears uncomfortable Cardiovascular: Regular rate and rhythm Respiratory: Clear to auscultation bilaterally GI: Abdomen is soft, nontender, nondistended, no abdominal masses GU: Bilateral CVA tenderness Lymph: No cervical or inguinal lymphadenopathy. Skin: No rashes, bruises or suspicious lesions. Neurologic: Grossly intact, no focal deficits, moving all 4 extremities. Psychiatric: Normal mood and affect.  Laboratory Data: Labs reviewed  Urine culture 2/24 no growth  Pertinent Imaging: I have personally reviewed the CT renal stone study dated 11/02/2018.  No residual left-sided stones.  Both stents in appropriate position.  Right 1 cm UPJ stone, small right lower pole stones.  Assessment & Plan:   In summary, the patient is a healthy 53 year old female who initially presented 10/23/2018 with bilateral ureteral stones and underwent left ureteroscopy and basket extraction of her left-sided ureteral stones, and right-sided ureteral stent placement secondary to inability to access the UPJ stone.  She has had no infections, and recent urine culture is negative.  She has had severe stent related symptoms of flank pain and urgency/frequency.  We discussed various treatment options for urolithiasis including observation with or without medical expulsive therapy, shockwave lithotripsy (SWL), ureteroscopy and laser lithotripsy with stent placement, and percutaneous nephrolithotomy.  We discussed that management is based on stone size, location, density, patient co-morbidities, and patient preference.   SWL has a lower stone free rate in a single procedure, but also a lower complication rate compared to ureteroscopy and avoids a stent and associated stent related symptoms. Possible complications include renal hematoma, steinstrasse, and need for additional treatment.  Ureteroscopy with  laser lithotripsy and stent placement has a higher stone free rate than SWL in a single procedure, however increased complication rate including possible infection, ureteral injury, bleeding, and stent related morbidity. Common stent related symptoms include dysuria, urgency/frequency, and flank pain.  -Schedule right ureteroscopy, laser lithotripsy, stent exchange, and left stent removal.  We will leave right ureteral stent on Dangler if possible with her severe stent symptoms. -With her severe stent pain, the earliest OR time available is Wednesday 3/4 with my partner Dr. Lonna Cobb, she is amenable to this   Sondra Come, MD  [facility] [address] [place], [place] [address] [phone]

## 2018-11-04 NOTE — Progress Notes (Signed)
 11/04/2018 11:47 AM   Brenda Hawkins 02/21/1966 [id number]  Referring provider: Bedsole, Amy E, MD [address] [place], [place] [address]  CC: Bilateral flank pain  HPI: I saw Brenda Hawkins today in urology clinic for follow-up after ureteroscopy and stent placement.  Briefly, she is a 52-year-old female that was originally seen on 10/24/2018 by my colleague Dr. Bell on call with bilateral ureteral stones and flank pain.  She had no clinical or laboratory signs of infection or sepsis at that time.  She underwent left ureteroscopy and basket extraction of distal ureteral stones and left ureteral stent placement, and a right ureteral stent was passed for inability to access the collecting system.  The plan was for follow-up ureteroscopy to treat her residual stone burden on the right side.    She was seen in the emergency department on 11/02/2018 with severe bilateral flank pain consistent with stent related symptoms.  Urine culture showed no growth, and stents were in appropriate position on CT scan.  There was no significant left-sided stone burden.  The right side was notable for a 1 cm right UPJ stone, and scattered small lower pole stones.  She continues to have severe bilateral flank pain as well as urgency, frequency, and dysuria secondary to her bilateral ureteral stents.  She denies any fevers or chills.  She is taking narcotics, Toradol, Zofran, and Flomax for stent symptoms.  She has a history of gastric bypass.  PMH: Past Medical History:  Diagnosis Date  . Anemia   . Anxiety   . Asthma   . Diabetes mellitus type II   . GERD (gastroesophageal reflux disease)   . Hypertension   . Obesity   . Osteopenia   . Sleep apnea   . 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 Vitamin B12 deficiency    since bariatric surgery    Surgical History: Past Surgical History:  Procedure Laterality Date  . CYSTOSCOPY WITH HOLMIUM LASER LITHOTRIPSY Left 10/24/2018   Procedure: CYSTOSCOPY WITH HOLMIUM  LASER LITHOTRIPSy;  Surgeon: Bell, Eugene D III, MD;  Location: [facility] ORS;  Service: Urology;  Laterality: Left;  . CYSTOSCOPY WITH STENT PLACEMENT Bilateral 10/24/2018   Procedure: CYSTOSCOPY WITH STENT PLACEMENT;  Surgeon: Bell, Eugene D III, MD;  Location: [facility] ORS;  Service: Urology;  Laterality: Bilateral;  . ESOPHAGOGASTRODUODENOSCOPY (EGD) WITH PROPOFOL N/A 06/01/2018   Procedure: ESOPHAGOGASTRODUODENOSCOPY (EGD) WITH PROPOFOL;  Surgeon: Vanga, Rohini Reddy, MD;  Location: [facility] ENDOSCOPY;  Service: Gastroenterology;  Laterality: N/A;  . 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 REDUCTION MAMMAPLASTY Right 1992  . URETEROSCOPY Left 10/24/2018   Procedure: URETEROSCOPY;  Surgeon: Bell, Eugene D III, MD;  Location: [facility] ORS;  Service: Urology;  Laterality: Left;    Allergies:  Allergies  Allergen Reactions  . Codeine Sulfate Other (See Comments)    Reaction: "violently ill"  . Penicillins Other (See Comments)    Reaction: Unsure   Has patient had a PCN reaction causing immediate rash, facial/tongue/throat swelling, SOB or lightheadedness with hypotension: No Has patient had a PCN reaction causing severe rash involving mucus membranes or skin necrosis: No Has patient had a PCN reaction that required hospitalization No Has patient had a PCN reaction occurring within the last 10 years: No If all of the above answers are "NO", then may proceed with Cephalosporin use.     Family History: Family History  Problem Relation Age of Onset  . Heart disease Mother        valve replacement surgery  .   Coronary artery disease Father   . Breast cancer Neg Hx     Social History:  reports that she has been smoking. She has been smoking about 1.00 pack per day. She has never used smokeless tobacco. She reports that she does not drink alcohol or use drugs.  ROS: Please see flowsheet from today's date for complete review of systems.  Physical Exam: BP (!) 166/83 (BP Location: Left Arm, Patient Position: Sitting,  Cuff Size: Normal)   Pulse 96   Ht 5' (1.524 m)   Wt 149 lb (67.6 kg)   BMI 29.10 kg/m    Constitutional: Appears uncomfortable Cardiovascular: Regular rate and rhythm Respiratory: Clear to auscultation bilaterally GI: Abdomen is soft, nontender, nondistended, no abdominal masses GU: Bilateral CVA tenderness Lymph: No cervical or inguinal lymphadenopathy. Skin: No rashes, bruises or suspicious lesions. Neurologic: Grossly intact, no focal deficits, moving all 4 extremities. Psychiatric: Normal mood and affect.  Laboratory Data: Labs reviewed  Urine culture 2/24 no growth  Pertinent Imaging: I have personally reviewed the CT renal stone study dated 11/02/2018.  No residual left-sided stones.  Both stents in appropriate position.  Right 1 cm UPJ stone, small right lower pole stones.  Assessment & Plan:   In summary, the patient is a healthy 53 year old female who initially presented 10/23/2018 with bilateral ureteral stones and underwent left ureteroscopy and basket extraction of her left-sided ureteral stones, and right-sided ureteral stent placement secondary to inability to access the UPJ stone.  She has had no infections, and recent urine culture is negative.  She has had severe stent related symptoms of flank pain and urgency/frequency.  We discussed various treatment options for urolithiasis including observation with or without medical expulsive therapy, shockwave lithotripsy (SWL), ureteroscopy and laser lithotripsy with stent placement, and percutaneous nephrolithotomy.  We discussed that management is based on stone size, location, density, patient co-morbidities, and patient preference.   SWL has a lower stone free rate in a single procedure, but also a lower complication rate compared to ureteroscopy and avoids a stent and associated stent related symptoms. Possible complications include renal hematoma, steinstrasse, and need for additional treatment.  Ureteroscopy with  laser lithotripsy and stent placement has a higher stone free rate than SWL in a single procedure, however increased complication rate including possible infection, ureteral injury, bleeding, and stent related morbidity. Common stent related symptoms include dysuria, urgency/frequency, and flank pain.  -Schedule right ureteroscopy, laser lithotripsy, stent exchange, and left stent removal.  We will leave right ureteral stent on Dangler if possible with her severe stent symptoms. -With her severe stent pain, the earliest OR time available is Wednesday 3/4 with my partner Dr. Lonna Cobb, she is amenable to this   Sondra Come, MD  [facility] [address] [place], [place] [address] [phone]

## 2018-11-09 ENCOUNTER — Other Ambulatory Visit: Payer: Self-pay | Admitting: Urology

## 2018-11-09 NOTE — Telephone Encounter (Signed)
I called pt. To discuss Preadmit and she ask if she could get a medication refilled for pain. The medication she is referring to is a pain medication Percocet that was prescribed by a doctor seen at her ER visit.

## 2018-11-10 MED ORDER — OXYCODONE-ACETAMINOPHEN 5-325 MG PO TABS: 1.0000 | ORAL_TABLET | Freq: Four times a day (QID) | ORAL | 0 refills | Status: DC | PRN

## 2018-11-10 NOTE — Telephone Encounter (Signed)
Rx was sent  

## 2018-11-11 ENCOUNTER — Ambulatory Visit: Payer: 59 | Admitting: Anesthesiology

## 2018-11-11 ENCOUNTER — Encounter
Admission: RE | Disposition: A | Discharge status: Home / Self Care | Payer: Self-pay | Source: Home / Self Care | Attending: Urology

## 2018-11-11 ENCOUNTER — Encounter: Payer: Self-pay | Admitting: Emergency Medicine

## 2018-11-11 ENCOUNTER — Ambulatory Visit
Admission: RE | Admit: 2018-11-11 | Discharge: 2018-11-11 | Disposition: A | Discharge status: Home / Self Care | Payer: 59 | Attending: Urology | Admitting: Urology

## 2018-11-11 ENCOUNTER — Other Ambulatory Visit: Payer: Self-pay

## 2018-11-11 DIAGNOSIS — Z9884 Bariatric surgery status: Secondary | ICD-10-CM | POA: Insufficient documentation

## 2018-11-11 DIAGNOSIS — Z466 Encounter for fitting and adjustment of urinary device: Secondary | ICD-10-CM | POA: Diagnosis not present

## 2018-11-11 DIAGNOSIS — E538 Deficiency of other specified B group vitamins: Secondary | ICD-10-CM | POA: Insufficient documentation

## 2018-11-11 DIAGNOSIS — K219 Gastro-esophageal reflux disease without esophagitis: Secondary | ICD-10-CM | POA: Diagnosis not present

## 2018-11-11 DIAGNOSIS — N132 Hydronephrosis with renal and ureteral calculous obstruction: Secondary | ICD-10-CM | POA: Diagnosis not present

## 2018-11-11 DIAGNOSIS — F17200 Nicotine dependence, unspecified, uncomplicated: Secondary | ICD-10-CM | POA: Insufficient documentation

## 2018-11-11 DIAGNOSIS — E119 Type 2 diabetes mellitus without complications: Secondary | ICD-10-CM | POA: Diagnosis not present

## 2018-11-11 DIAGNOSIS — M8580 Other specified disorders of bone density and structure, unspecified site: Secondary | ICD-10-CM | POA: Diagnosis not present

## 2018-11-11 DIAGNOSIS — Z6829 Body mass index (BMI) 29.0-29.9, adult: Secondary | ICD-10-CM | POA: Diagnosis not present

## 2018-11-11 DIAGNOSIS — F419 Anxiety disorder, unspecified: Secondary | ICD-10-CM | POA: Insufficient documentation

## 2018-11-11 DIAGNOSIS — J45909 Unspecified asthma, uncomplicated: Secondary | ICD-10-CM | POA: Insufficient documentation

## 2018-11-11 DIAGNOSIS — G4730 Sleep apnea, unspecified: Secondary | ICD-10-CM | POA: Diagnosis not present

## 2018-11-11 DIAGNOSIS — N201 Calculus of ureter: Secondary | ICD-10-CM

## 2018-11-11 DIAGNOSIS — I10 Essential (primary) hypertension: Secondary | ICD-10-CM | POA: Insufficient documentation

## 2018-11-11 DIAGNOSIS — Z8249 Family history of ischemic heart disease and other diseases of the circulatory system: Secondary | ICD-10-CM | POA: Diagnosis not present

## 2018-11-11 DIAGNOSIS — D649 Anemia, unspecified: Secondary | ICD-10-CM | POA: Diagnosis not present

## 2018-11-11 DIAGNOSIS — N200 Calculus of kidney: Secondary | ICD-10-CM

## 2018-11-11 DIAGNOSIS — Z885 Allergy status to narcotic agent status: Secondary | ICD-10-CM | POA: Insufficient documentation

## 2018-11-11 DIAGNOSIS — E669 Obesity, unspecified: Secondary | ICD-10-CM | POA: Insufficient documentation

## 2018-11-11 HISTORY — PX: CYSTOSCOPY/URETEROSCOPY/HOLMIUM LASER/STENT PLACEMENT: SHX6546

## 2018-11-11 HISTORY — PX: CYSTOSCOPY W/ URETERAL STENT PLACEMENT: SHX1429

## 2018-11-11 SURGERY — CYSTOSCOPY/URETEROSCOPY/HOLMIUM LASER/STENT PLACEMENT
Anesthesia: General | Laterality: Right

## 2018-11-11 MED ORDER — PROPOFOL 10 MG/ML IV BOLUS
INTRAVENOUS | Status: DC | PRN
  Administered 2018-11-11: 150 mg via INTRAVENOUS

## 2018-11-11 MED ORDER — FENTANYL CITRATE (PF) 100 MCG/2ML IJ SOLN
INTRAMUSCULAR | Status: AC
  Administered 2018-11-11: 25 ug via INTRAVENOUS
  Filled 2018-11-11: qty 2

## 2018-11-11 MED ORDER — BELLADONNA ALKALOIDS-OPIUM 16.2-60 MG RE SUPP
1.0000 | Freq: Once | RECTAL | Status: AC
  Administered 2018-11-11: 1 via RECTAL

## 2018-11-11 MED ORDER — IOHEXOL 180 MG/ML  SOLN
INTRAMUSCULAR | Status: DC | PRN
  Administered 2018-11-11: 15 mL

## 2018-11-11 MED ORDER — MIDAZOLAM HCL 2 MG/2ML IJ SOLN
INTRAMUSCULAR | Status: DC | PRN
  Administered 2018-11-11: 2 mg via INTRAVENOUS

## 2018-11-11 MED ORDER — SODIUM CHLORIDE FLUSH 0.9 % IV SOLN
INTRAVENOUS | Status: AC
  Filled 2018-11-11: qty 10

## 2018-11-11 MED ORDER — CIPROFLOXACIN IN D5W 400 MG/200ML IV SOLN
400.0000 mg | Freq: Once | INTRAVENOUS | Status: AC
  Administered 2018-11-11: 400 mg via INTRAVENOUS

## 2018-11-11 MED ORDER — FENTANYL CITRATE (PF) 100 MCG/2ML IJ SOLN
INTRAMUSCULAR | Status: AC
  Filled 2018-11-11: qty 2

## 2018-11-11 MED ORDER — BELLADONNA ALKALOIDS-OPIUM 16.2-60 MG RE SUPP
RECTAL | Status: AC
  Administered 2018-11-11: 1 via RECTAL
  Filled 2018-11-11: qty 1

## 2018-11-11 MED ORDER — LIDOCAINE HCL (CARDIAC) PF 100 MG/5ML IV SOSY
PREFILLED_SYRINGE | INTRAVENOUS | Status: DC | PRN
  Administered 2018-11-11: 100 mg via INTRAVENOUS

## 2018-11-11 MED ORDER — DEXAMETHASONE SODIUM PHOSPHATE 10 MG/ML IJ SOLN
INTRAMUSCULAR | Status: DC | PRN
  Administered 2018-11-11: 10 mg via INTRAVENOUS

## 2018-11-11 MED ORDER — FENTANYL CITRATE (PF) 100 MCG/2ML IJ SOLN
25.0000 ug | INTRAMUSCULAR | Status: DC | PRN
  Administered 2018-11-11 (×4): 25 ug via INTRAVENOUS

## 2018-11-11 MED ORDER — FENTANYL CITRATE (PF) 100 MCG/2ML IJ SOLN
INTRAMUSCULAR | Status: DC | PRN
  Administered 2018-11-11 (×3): 50 ug via INTRAVENOUS

## 2018-11-11 MED ORDER — ROCURONIUM BROMIDE 100 MG/10ML IV SOLN
INTRAVENOUS | Status: DC | PRN
  Administered 2018-11-11: 40 mg via INTRAVENOUS
  Administered 2018-11-11: 10 mg via INTRAVENOUS

## 2018-11-11 MED ORDER — DEXAMETHASONE SODIUM PHOSPHATE 10 MG/ML IJ SOLN
INTRAMUSCULAR | Status: AC
  Filled 2018-11-11: qty 1

## 2018-11-11 MED ORDER — ONDANSETRON HCL 4 MG/2ML IJ SOLN
INTRAMUSCULAR | Status: AC
  Filled 2018-11-11: qty 2

## 2018-11-11 MED ORDER — PROMETHAZINE HCL 25 MG/ML IJ SOLN
6.2500 mg | INTRAMUSCULAR | Status: AC | PRN
  Administered 2018-11-11 (×2): 6.25 mg via INTRAVENOUS

## 2018-11-11 MED ORDER — SCOPOLAMINE 1 MG/3DAYS TD PT72
MEDICATED_PATCH | TRANSDERMAL | Status: AC
  Filled 2018-11-11: qty 1

## 2018-11-11 MED ORDER — MIDAZOLAM HCL 2 MG/2ML IJ SOLN
INTRAMUSCULAR | Status: AC
  Filled 2018-11-11: qty 2

## 2018-11-11 MED ORDER — PROMETHAZINE HCL 25 MG/ML IJ SOLN
INTRAMUSCULAR | Status: AC
  Administered 2018-11-11: 6.25 mg via INTRAVENOUS
  Filled 2018-11-11: qty 1

## 2018-11-11 MED ORDER — SCOPOLAMINE 1 MG/3DAYS TD PT72
1.0000 | MEDICATED_PATCH | TRANSDERMAL | Status: DC
  Administered 2018-11-11: 1.5 mg via TRANSDERMAL

## 2018-11-11 MED ORDER — CIPROFLOXACIN IN D5W 400 MG/200ML IV SOLN
INTRAVENOUS | Status: AC
  Filled 2018-11-11: qty 200

## 2018-11-11 MED ORDER — PROPOFOL 10 MG/ML IV BOLUS
INTRAVENOUS | Status: AC
  Filled 2018-11-11: qty 20

## 2018-11-11 MED ORDER — SUGAMMADEX SODIUM 200 MG/2ML IV SOLN
INTRAVENOUS | Status: DC | PRN
  Administered 2018-11-11: 200 mg via INTRAVENOUS

## 2018-11-11 MED ORDER — LIDOCAINE HCL (PF) 2 % IJ SOLN
INTRAMUSCULAR | Status: AC
  Filled 2018-11-11: qty 10

## 2018-11-11 MED ORDER — SODIUM CHLORIDE 0.9 % IV SOLN
INTRAVENOUS | Status: DC
  Administered 2018-11-11: 12:00:00 via INTRAVENOUS

## 2018-11-11 SURGICAL SUPPLY — 31 items
BAG DRAIN CYSTO-URO LG1000N (MISCELLANEOUS) ×4 IMPLANT
BASKET ZERO TIP 1.9FR (BASKET) ×2 IMPLANT
BRUSH SCRUB EZ 1% IODOPHOR (MISCELLANEOUS) ×4 IMPLANT
BSKT STON RTRVL ZERO TP 1.9FR (BASKET) ×2
CATH URETL 5X70 OPEN END (CATHETERS) IMPLANT
CNTNR SPEC 2.5X3XGRAD LEK (MISCELLANEOUS) ×2
CONT SPEC 4OZ STER OR WHT (MISCELLANEOUS) ×2
CONT SPEC 4OZ STRL OR WHT (MISCELLANEOUS) ×2
CONTAINER SPEC 2.5X3XGRAD LEK (MISCELLANEOUS) IMPLANT
DRAPE UTILITY 15X26 TOWEL STRL (DRAPES) ×4 IMPLANT
FIBER LASER LITHO 273 (Laser) ×2 IMPLANT
GLOVE BIO SURGEON STRL SZ8 (GLOVE) ×4 IMPLANT
GOWN STRL REUS W/ TWL LRG LVL3 (GOWN DISPOSABLE) ×2 IMPLANT
GOWN STRL REUS W/ TWL XL LVL3 (GOWN DISPOSABLE) ×2 IMPLANT
GOWN STRL REUS W/TWL LRG LVL3 (GOWN DISPOSABLE) ×4
GOWN STRL REUS W/TWL XL LVL3 (GOWN DISPOSABLE) ×4
GUIDEWIRE STR DUAL SENSOR (WIRE) ×6 IMPLANT
INFUSOR MANOMETER BAG 3000ML (MISCELLANEOUS) ×4 IMPLANT
INTRODUCER DILATOR DOUBLE (INTRODUCER) ×2 IMPLANT
KIT TURNOVER CYSTO (KITS) ×4 IMPLANT
PACK CYSTO AR (MISCELLANEOUS) ×4 IMPLANT
SET CYSTO W/LG BORE CLAMP LF (SET/KITS/TRAYS/PACK) ×4 IMPLANT
SHEATH URETERAL 12FRX35CM (MISCELLANEOUS) IMPLANT
SOL .9 NS 3000ML IRR  AL (IV SOLUTION) ×2
SOL .9 NS 3000ML IRR AL (IV SOLUTION) ×2
SOL .9 NS 3000ML IRR UROMATIC (IV SOLUTION) ×2 IMPLANT
STENT URET 6FRX24 CONTOUR (STENTS) ×2 IMPLANT
STENT URET 6FRX26 CONTOUR (STENTS) IMPLANT
SURGILUBE 2OZ TUBE FLIPTOP (MISCELLANEOUS) ×4 IMPLANT
VALVE UROSEAL ADJ ENDO (VALVE) ×2 IMPLANT
WATER STERILE IRR 1000ML POUR (IV SOLUTION) ×4 IMPLANT

## 2018-11-11 NOTE — Anesthesia Post-op Follow-up Note (Signed)
Anesthesia QCDR form completed.        

## 2018-11-11 NOTE — Discharge Instructions (Signed)
DISCHARGE INSTRUCTIONS FOR KIDNEY STONE/URETERAL STENT   MEDICATIONS:  1. Resume all your other meds from home.  2.  AZO (over-the-counter) can help with the burning/stinging when you urinate. 3.  Oxycodone is for moderate/severe pain, Rx was sent to your pharmacy yesterday.  ACTIVITY:  1. May resume regular activities in 24 hours. 2. No driving while on narcotic pain medications  3. Drink plenty of water  4. Continue to walk at home - you can still get blood clots when you are at home, so keep active, but don't over do it.  5. May return to work/school tomorrow or when you feel ready   BATHING:  1. You can shower or bathe.  SIGNS/SYMPTOMS TO CALL:  Please call us if you have a fever greater than 101.5, uncontrolled nausea/vomiting, uncontrolled pain, dizziness, unable to urinate, excessively bloody urine, chest pain, shortness of breath, leg swelling, leg pain, or any other concerns or questions.   Urinary frequency, urgency, blood in the urine are all common symptoms  You can reach [place] at [phone].   FOLLOW-UP:  1. You will be contacted for an appointment to have your stent removed in the office  AMBULATORY SURGERY  DISCHARGE INSTRUCTIONS   1) The drugs that you were given will stay in your system until tomorrow so for the next 24 hours you should not:  A) Drive an automobile B) Make any legal decisions C) Drink any alcoholic beverage   2) You may resume regular meals tomorrow.  Today it is better to start with liquids and gradually work up to solid foods.  You may eat anything you prefer, but it is better to start with liquids, then soup and crackers, and gradually work up to solid foods.   3) Please notify your doctor immediately if you have any unusual bleeding, trouble breathing, redness and pain at the surgery site, drainage, fever, or pain not relieved by medication.    4) Additional Instructions:        Please contact your physician with any problems  or Same Day Surgery at [phone], Monday through Friday 6 am to 4 pm, or  at [facility] number at [phone].

## 2018-11-11 NOTE — Interval H&P Note (Signed)
History and Physical Interval Note:  11/11/2018 12:56 PM  Brenda Hawkins  has presented today for surgery, with the diagnosis of right ureteral stone,prestented  The various methods of treatment have been discussed with the patient and family. After consideration of risks, benefits and other options for treatment, the patient has consented to  Procedure(s): CYSTOSCOPY/URETEROSCOPY/HOLMIUM LASER/STENT EXCHANGE (Right) CYSTOSCOPY WITH STENT REMOVAL (Left) as a surgical intervention .  The patient's history has been reviewed, patient examined, no change in status, stable for surgery.  I have reviewed the patient's chart and labs.  Questions were answered to the patient's satisfaction.     Scott C Stoioff

## 2018-11-11 NOTE — Transfer of Care (Signed)
Immediate Anesthesia Transfer of Care Note  Patient: Brenda Hawkins  Procedure(s) Performed: CYSTOSCOPY/URETEROSCOPY/HOLMIUM LASER/STENT EXCHANGE (Right ) CYSTOSCOPY WITH STENT REMOVAL (Left )  Patient Location: PACU  Anesthesia Type:General  Level of Consciousness: awake, alert  and oriented  Airway & Oxygen Therapy: Patient Spontanous Breathing and Patient connected to face mask oxygen  Post-op Assessment: Report given to RN and Post -op Vital signs reviewed and stable  Post vital signs: Reviewed and stable  Last Vitals:  Vitals Value Taken Time  BP 157/92 11/11/2018  2:44 PM  Temp    Pulse 81 11/11/2018  2:45 PM  Resp 19 11/11/2018  2:45 PM  SpO2 100 % 11/11/2018  2:45 PM  Vitals shown include unvalidated device data.  Last Pain:  Vitals:   11/11/18 1159  TempSrc: Oral  PainSc: 4          Complications: No apparent anesthesia complications

## 2018-11-11 NOTE — Anesthesia Preprocedure Evaluation (Addendum)
Anesthesia Evaluation  Patient identified by MRN, date of birth, ID band Patient awake    Reviewed: Allergy & Precautions, H&P , NPO status , Patient's Chart, lab work & pertinent test results  History of Anesthesia Complications (+) PONV and history of anesthetic complications (Did well last time with Decadron and Zofran)  Airway Mallampati: III  TM Distance: <3 FB     Dental  (+) Teeth Intact   Pulmonary asthma , sleep apnea , Current Smoker,           Cardiovascular hypertension, (-) angina(-) Past MI and (-) Cardiac Stents negative cardio ROS       Neuro/Psych PSYCHIATRIC DISORDERS Anxiety negative neurological ROS     GI/Hepatic Neg liver ROS, GERD  ,  Endo/Other  negative endocrine ROSdiabetes  Renal/GU      Musculoskeletal   Abdominal   Peds  Hematology  (+) Blood dyscrasia, anemia ,   Anesthesia Other Findings Past Medical History: No date: Anemia No date: Anxiety No date: Asthma No date: Diabetes mellitus type II No date: GERD (gastroesophageal reflux disease) No date: Hypertension No date: Obesity No date: Osteopenia No date: Sleep apnea 9/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 No date: Vitamin B12 deficiency     Comment:  since bariatric surgery  Past Surgical History: 10/24/2018: CYSTOSCOPY WITH HOLMIUM LASER LITHOTRIPSY; Left     Comment:  Procedure: CYSTOSCOPY WITH HOLMIUM LASER LITHOTRIPSy;                Surgeon: Crista Elliot, MD;  Location: [facility];                Service: Urology;  Laterality: Left; 10/24/2018: CYSTOSCOPY WITH STENT PLACEMENT; Bilateral     Comment:  Procedure: CYSTOSCOPY WITH STENT PLACEMENT;  Surgeon:               Crista Elliot, MD;  Location: [facility];  Service:               Urology;  Laterality: Bilateral; 06/01/2018: ESOPHAGOGASTRODUODENOSCOPY (EGD) WITH PROPOFOL; N/A     Comment:  Procedure: ESOPHAGOGASTRODUODENOSCOPY (EGD) WITH               PROPOFOL;  Surgeon: Toney Reil, MD;  Location:               [facility];  Service: Gastroenterology;  Laterality:               N/A; 03/17/02: GASTRIC BYPASS 1992: REDUCTION MAMMAPLASTY; Right 10/24/2018: URETEROSCOPY; Left     Comment:  Procedure: URETEROSCOPY;  Surgeon: Crista Elliot,               MD;  Location: [facility];  Service: Urology;  Laterality:               Left;  BMI    Body Mass Index:  29.10 kg/m      Reproductive/Obstetrics negative OB ROS                            Anesthesia Physical Anesthesia Plan  ASA: III  Anesthesia Plan: General ETT   Post-op Pain Management:    Induction:   PONV Risk Score and Plan: Dexamethasone, Ondansetron, Midazolam and Treatment may vary due to age or medical condition  Airway Management Planned:   Additional Equipment:   Intra-op Plan:   Post-operative Plan:   Informed Consent: I have reviewed the patients History and  Physical, chart, labs and discussed the procedure including the risks, benefits and alternatives for the proposed anesthesia with the patient or authorized representative who has indicated his/her understanding and acceptance.     Dental Advisory Given  Plan Discussed with: Anesthesiologist and CRNA  Anesthesia Plan Comments:        Anesthesia Quick Evaluation

## 2018-11-11 NOTE — Anesthesia Procedure Notes (Signed)
Procedure Name: Intubation Date/Time: 11/11/2018 1:15 PM Performed by: Doreen Salvage, CRNA Pre-anesthesia Checklist: Patient identified, Patient being monitored, Timeout performed, Emergency Drugs available and Suction available Patient Re-evaluated:Patient Re-evaluated prior to induction Oxygen Delivery Method: Circle system utilized Preoxygenation: Pre-oxygenation with 100% oxygen Induction Type: IV induction Ventilation: Mask ventilation without difficulty Laryngoscope Size: Mac, 3 and McGraph Grade View: Grade I Tube type: Oral Tube size: 7.0 mm Number of attempts: 1 Airway Equipment and Method: Stylet Placement Confirmation: ETT inserted through vocal cords under direct vision,  positive ETCO2 and breath sounds checked- equal and bilateral Secured at: 21 cm Tube secured with: Tape Dental Injury: Teeth and Oropharynx as per pre-operative assessment

## 2018-11-11 NOTE — Progress Notes (Signed)
Dr. Lonna Cobb called and notified that pt having back and lower abdomen pressure. Acknowledged. Orders received. Maebell Lyvers E 3:48 PM 11/11/2018

## 2018-11-12 ENCOUNTER — Telehealth: Payer: Self-pay | Admitting: Urology

## 2018-11-12 ENCOUNTER — Encounter: Payer: Self-pay | Admitting: Urology

## 2018-11-12 NOTE — Telephone Encounter (Signed)
-----   Message from Riki Altes, MD sent at 11/11/2018  2:45 PM EST ----- Schedule cystoscopy with stent removal Wednesday, Thursday or Friday of next week with any available provider

## 2018-11-12 NOTE — Op Note (Signed)
Preoperative diagnosis:  1.  Right proximal ureteral calculus 2.  Right nephrolithiasis 3.  Left ureteral stent  Postoperative diagnosis:  Same as above  Procedure:  1. Cystoscopy 2. Right ureteroscopy and stone removal 3. Ureteroscopic laser lithotripsy 4. Right ureteral stent placement (6FR) 24 cm 5. Left ureteral stent removal 6. Right retrograde pyelography with interpretation  Surgeon: Lorin Picket C. , M.D.  Anesthesia: General  Complications: None  Intraoperative findings:  1.  Right retrograde pyelography demonstrated a filling defect within the right proximal ureter consistent with the patient's known calculus without other abnormalities.  Retrograde pyelography post procedure showed no filling defects, stone fragments or contrast extravasation  EBL: Minimal  Specimens: 1. Calculus fragments for analysis   Indication: Brenda Hawkins is a 53 y.o. year old female with a history of bilateral ureteral calculi.  She underwent left ureteroscopic stone removal with stent placement and right ureteral stent placement by Dr. Alvester Morin.  She presents for definitive treatment of her right ureteral and right renal calculus and left ureteral stent removal.  After reviewing the management options for treatment, the patient elected to proceed with the above surgical procedure(s). We have discussed the potential benefits and risks of the procedure, side effects of the proposed treatment, the likelihood of the patient achieving the goals of the procedure, and any potential problems that might occur during the procedure or recuperation. Informed consent has been obtained.  Description of procedure:  The patient was taken to the operating room and general anesthesia was induced.  The patient was placed in the dorsal lithotomy position, prepped and draped in the usual sterile fashion, and preoperative antibiotics were administered. A preoperative time-out was performed.   A 21 French  cystoscope was lubricated and passed per urethra.  .  Panendoscopy was performed and the bladder mucosa showed mild inflammatory changes of the trigone secondary to her indwelling stents.  There were no solid or papillary lesions.  The left ureteral stent was grasped with endoscopic forceps and removed without difficulty.  Attention was then directed to the right ureteral orifice and the right ureteral stent was grasped with endoscopic forceps and brought out to the urethral meatus.  A 0.038 Sensor wire was then placed through the stent and into the renal pelvis under fluoroscopic guidance.  The cystoscope was removed and a dual-lumen catheter was placed over the sensor wire.  Retrograde pyelogram was performed with findings as described above.  A second sensor wire was placed in a similar fashion through the dual-lumen catheter.  A digital flexible ureteroscope was placed over the working wire into the ureter and once in the mid ureter the working wire was removed.  The ureteroscope was advanced proximally and the calculus was identified in the proximal ureter with inflammatory changes of the ureter just distal to the stone.    A 273 micron holmium laser fiber was placed through the ureteroscope and the stone was dusted at a setting of 0.2 J and frequency of 20 hz.  Larger fragments were removed with a 1.9 French 0 tip nitinol basket.  Once all fragments were removed and the ureter was clear the ureteroscope was advanced into the renal pelvis.  Retrograde pyelogram was performed.  No fragments were identified in the renal pelvis or upper/middle calyces.  Her previously known right lower pole calculus was identified in a lower pole calyx and grasped with the nitinol basket and removed without difficulty.  As the ureteroscope was withdrawn no additional fragments were identified.  A 6  FR/24 CM stent was placed under fluoroscopic guidance.  The wire was then removed with an adequate stent curl noted in the  renal pelvis as well as in the bladder.  The bladder was then emptied and the procedure ended.  The patient appeared to tolerate the procedure well and without complications.  After anesthetic reversal the patient was transported to the PACU in stable condition.   Plan: She will follow-up in the office in 1 week for cystoscopy with stent removal 2

## 2018-11-12 NOTE — Telephone Encounter (Signed)
Schedule cystoscopy with stent removal Wednesday, Thursday or Friday of next week with any available provider

## 2018-11-13 NOTE — Anesthesia Postprocedure Evaluation (Signed)
Anesthesia Post Note  Patient: Brenda Hawkins  Procedure(s) Performed: CYSTOSCOPY/URETEROSCOPY/HOLMIUM LASER/STENT EXCHANGE (Right ) CYSTOSCOPY WITH STENT REMOVAL (Left )  Patient location during evaluation: PACU Anesthesia Type: General Level of consciousness: awake and alert Pain management: pain level controlled Vital Signs Assessment: post-procedure vital signs reviewed and stable Respiratory status: spontaneous breathing, nonlabored ventilation and respiratory function stable Cardiovascular status: blood pressure returned to baseline and stable Postop Assessment: no apparent nausea or vomiting Anesthetic complications: no     Last Vitals:  Vitals:   11/11/18 1610 11/11/18 1618  BP:  (!) 145/80  Pulse: 84 78  Resp: 15 18  Temp: 36.7 C 36.4 C  SpO2: 98% 99%    Last Pain:  Vitals:   11/11/18 1618  TempSrc: Temporal  PainSc: 3                  Jovita Gamma

## 2018-11-17 ENCOUNTER — Encounter: Payer: Self-pay | Admitting: Urology

## 2018-11-17 ENCOUNTER — Ambulatory Visit (INDEPENDENT_AMBULATORY_CARE_PROVIDER_SITE_OTHER): Payer: 59 | Admitting: Urology

## 2018-11-17 VITALS — BP 175/98 | HR 106 | Ht 60.0 in | Wt 137.0 lb

## 2018-11-17 DIAGNOSIS — N201 Calculus of ureter: Secondary | ICD-10-CM | POA: Diagnosis not present

## 2018-11-17 NOTE — Progress Notes (Signed)
Cystoscopy Procedure Note:  Indication: Stent removal s/p R URS/LL/stent with Stoioff, history of bilateral stones  After informed consent and discussion of the procedure and its risks, Brenda Hawkins was positioned and prepped in the standard fashion. Cystoscopy was performed with a flexible cystoscope. The stent was grasped with flexible graspers and removed in its entirety. The patient tolerated the procedure well.  Findings: Uncomplicated stent removal  Assessment and Plan: Follow up in 4-6 weeks with renal ultrasound to evaluate for silent hydronephrosis, and to review 24 hour urine results   Sondra Come, MD 11/17/2018

## 2018-11-25 LAB — CALCULI, WITH PHOTOGRAPH (CLINICAL LAB)
Calcium Oxalate Monohydrate: 100 %
Weight Calculi: 5 mg

## 2018-11-27 ENCOUNTER — Telehealth: Payer: Self-pay | Admitting: Urology

## 2018-11-27 NOTE — Telephone Encounter (Signed)
Spoke to patient and re faxed the [employer] again today. I informed patient that the imaging department will contact her closer to the appointment time.

## 2018-11-27 NOTE — Telephone Encounter (Signed)
Pt LMOM and asked why she hasn't received her kit in the mail for her urine sample also why she hasn't received a call about her Ultrasound appt. Please advise.

## 2018-11-29 ENCOUNTER — Other Ambulatory Visit: Payer: Self-pay

## 2018-11-30 ENCOUNTER — Inpatient Hospital Stay: Payer: 59 | Attending: Hematology and Oncology

## 2018-11-30 ENCOUNTER — Inpatient Hospital Stay: Payer: 59

## 2018-11-30 ENCOUNTER — Other Ambulatory Visit: Payer: Self-pay

## 2018-11-30 VITALS — Resp 18

## 2018-11-30 DIAGNOSIS — D500 Iron deficiency anemia secondary to blood loss (chronic): Secondary | ICD-10-CM

## 2018-11-30 DIAGNOSIS — D7589 Other specified diseases of blood and blood-forming organs: Secondary | ICD-10-CM

## 2018-11-30 DIAGNOSIS — D751 Secondary polycythemia: Secondary | ICD-10-CM | POA: Diagnosis not present

## 2018-11-30 DIAGNOSIS — D509 Iron deficiency anemia, unspecified: Secondary | ICD-10-CM | POA: Diagnosis not present

## 2018-11-30 DIAGNOSIS — E538 Deficiency of other specified B group vitamins: Secondary | ICD-10-CM | POA: Insufficient documentation

## 2018-11-30 DIAGNOSIS — D508 Other iron deficiency anemias: Secondary | ICD-10-CM

## 2018-11-30 LAB — CBC WITH DIFFERENTIAL/PLATELET
Abs Immature Granulocytes: 0.05 10*3/uL (ref 0.00–0.07)
Basophils Absolute: 0 10*3/uL (ref 0.0–0.1)
Basophils Relative: 0 %
Eosinophils Absolute: 0.1 10*3/uL (ref 0.0–0.5)
Eosinophils Relative: 1 %
HCT: 47 % — ABNORMAL HIGH (ref 36.0–46.0)
Hemoglobin: 16.7 g/dL — ABNORMAL HIGH (ref 12.0–15.0)
Immature Granulocytes: 1 %
Lymphocytes Relative: 16 %
Lymphs Abs: 1.2 10*3/uL (ref 0.7–4.0)
MCH: 38.1 pg — ABNORMAL HIGH (ref 26.0–34.0)
MCHC: 35.5 g/dL (ref 30.0–36.0)
MCV: 107.3 fL — ABNORMAL HIGH (ref 80.0–100.0)
Monocytes Absolute: 0.6 10*3/uL (ref 0.1–1.0)
Monocytes Relative: 7 %
Neutro Abs: 5.6 10*3/uL (ref 1.7–7.7)
Neutrophils Relative %: 75 %
Platelets: 271 10*3/uL (ref 150–400)
RBC: 4.38 MIL/uL (ref 3.87–5.11)
RDW: 12.3 % (ref 11.5–15.5)
WBC: 7.5 10*3/uL (ref 4.0–10.5)
nRBC: 0 % (ref 0.0–0.2)

## 2018-11-30 LAB — FERRITIN: Ferritin: 160 ng/mL (ref 11–307)

## 2018-11-30 MED ORDER — CYANOCOBALAMIN 1000 MCG/ML IJ SOLN
1000.0000 ug | Freq: Once | INTRAMUSCULAR | Status: AC
  Administered 2018-11-30: 1000 ug via INTRAMUSCULAR

## 2018-11-30 NOTE — Patient Instructions (Signed)
Cyanocobalamin, Vitamin B12 injection What is this medicine? CYANOCOBALAMIN (sye an oh koe BAL a min) is a man made form of vitamin B12. Vitamin B12 is used in the growth of healthy blood cells, nerve cells, and proteins in the body. It also helps with the metabolism of fats and carbohydrates. This medicine is used to treat people who can not absorb vitamin B12. This medicine may be used for other purposes; ask your health care provider or pharmacist if you have questions. COMMON BRAND NAME(S): B-12 Compliance Kit, B-12 Injection Kit, Cyomin, LA-12, Nutri-Twelve, Physicians EZ Use B-12, Primabalt What should I tell my health care provider before I take this medicine? They need to know if you have any of these conditions: -kidney disease -Leber's disease -megaloblastic anemia -an unusual or allergic reaction to cyanocobalamin, cobalt, other medicines, foods, dyes, or preservatives -pregnant or trying to get pregnant -breast-feeding How should I use this medicine? This medicine is injected into a muscle or deeply under the skin. It is usually given by a health care professional in a clinic or doctor's office. However, your doctor may teach you how to inject yourself. Follow all instructions. Talk to your pediatrician regarding the use of this medicine in children. Special care may be needed. Overdosage: If you think you have taken too much of this medicine contact a poison control center or emergency room at once. NOTE: This medicine is only for you. Do not share this medicine with others. What if I miss a dose? If you are given your dose at a clinic or doctor's office, call to reschedule your appointment. If you give your own injections and you miss a dose, take it as soon as you can. If it is almost time for your next dose, take only that dose. Do not take double or extra doses. What may interact with this medicine? -colchicine -heavy alcohol intake This list may not describe all possible  interactions. Give your health care provider a list of all the medicines, herbs, non-prescription drugs, or dietary supplements you use. Also tell them if you smoke, drink alcohol, or use illegal drugs. Some items may interact with your medicine. What should I watch for while using this medicine? Visit your doctor or health care professional regularly. You may need blood work done while you are taking this medicine. You may need to follow a special diet. Talk to your doctor. Limit your alcohol intake and avoid smoking to get the best benefit. What side effects may I notice from receiving this medicine? Side effects that you should report to your doctor or health care professional as soon as possible: -allergic reactions like skin rash, itching or hives, swelling of the face, lips, or tongue -blue tint to skin -chest tightness, pain -difficulty breathing, wheezing -dizziness -red, swollen painful area on the leg Side effects that usually do not require medical attention (report to your doctor or health care professional if they continue or are bothersome): -diarrhea -headache This list may not describe all possible side effects. Call your doctor for medical advice about side effects. You may report side effects to FDA at 1-800-FDA-1088. Where should I keep my medicine? Keep out of the reach of children. Store at room temperature between 15 and 30 degrees C (59 and 85 degrees F). Protect from light. Throw away any unused medicine after the expiration date. NOTE: This sheet is a summary. It may not cover all possible information. If you have questions about this medicine, talk to your doctor, pharmacist, or   health care provider.  2019 Elsevier/Gold Standard (2007-12-07 22:10:20)  

## 2018-12-28 ENCOUNTER — Inpatient Hospital Stay: Payer: 59

## 2018-12-28 ENCOUNTER — Other Ambulatory Visit: Payer: 59

## 2018-12-28 ENCOUNTER — Inpatient Hospital Stay: Payer: 59 | Attending: Hematology and Oncology

## 2018-12-28 ENCOUNTER — Telehealth: Payer: Self-pay

## 2018-12-28 ENCOUNTER — Other Ambulatory Visit: Payer: Self-pay

## 2018-12-28 VITALS — BP 150/90 | HR 90 | Temp 99.0°F | Resp 18

## 2018-12-28 DIAGNOSIS — D751 Secondary polycythemia: Secondary | ICD-10-CM | POA: Insufficient documentation

## 2018-12-28 DIAGNOSIS — D7589 Other specified diseases of blood and blood-forming organs: Secondary | ICD-10-CM | POA: Diagnosis not present

## 2018-12-28 DIAGNOSIS — E538 Deficiency of other specified B group vitamins: Secondary | ICD-10-CM | POA: Diagnosis not present

## 2018-12-28 DIAGNOSIS — D509 Iron deficiency anemia, unspecified: Secondary | ICD-10-CM | POA: Insufficient documentation

## 2018-12-28 DIAGNOSIS — D500 Iron deficiency anemia secondary to blood loss (chronic): Secondary | ICD-10-CM

## 2018-12-28 DIAGNOSIS — D508 Other iron deficiency anemias: Secondary | ICD-10-CM

## 2018-12-28 LAB — FERRITIN: Ferritin: 97 ng/mL (ref 11–307)

## 2018-12-28 LAB — CBC WITH DIFFERENTIAL/PLATELET
Abs Immature Granulocytes: 0.05 10*3/uL (ref 0.00–0.07)
Basophils Absolute: 0 10*3/uL (ref 0.0–0.1)
Basophils Relative: 0 %
Eosinophils Absolute: 0 10*3/uL (ref 0.0–0.5)
Eosinophils Relative: 1 %
HCT: 47.9 % — ABNORMAL HIGH (ref 36.0–46.0)
Hemoglobin: 17.2 g/dL — ABNORMAL HIGH (ref 12.0–15.0)
Immature Granulocytes: 1 %
Lymphocytes Relative: 20 %
Lymphs Abs: 1.5 10*3/uL (ref 0.7–4.0)
MCH: 38.7 pg — ABNORMAL HIGH (ref 26.0–34.0)
MCHC: 35.9 g/dL (ref 30.0–36.0)
MCV: 107.6 fL — ABNORMAL HIGH (ref 80.0–100.0)
Monocytes Absolute: 0.5 10*3/uL (ref 0.1–1.0)
Monocytes Relative: 7 %
Neutro Abs: 5.2 10*3/uL (ref 1.7–7.7)
Neutrophils Relative %: 71 %
Platelets: 265 10*3/uL (ref 150–400)
RBC: 4.45 MIL/uL (ref 3.87–5.11)
RDW: 12.6 % (ref 11.5–15.5)
WBC: 7.3 10*3/uL (ref 4.0–10.5)
nRBC: 0 % (ref 0.0–0.2)

## 2018-12-28 MED ORDER — CYANOCOBALAMIN 1000 MCG/ML IJ SOLN
1000.0000 ug | Freq: Once | INTRAMUSCULAR | Status: AC
  Administered 2018-12-28: 1000 ug via INTRAMUSCULAR
  Filled 2018-12-28: qty 1

## 2018-12-28 NOTE — Telephone Encounter (Signed)
Attempted to contact patient regarding polycythemia and televisit appt. VM left requesting patient call back.

## 2018-12-28 NOTE — Telephone Encounter (Signed)
-----   Message from Rosey Bath, MD sent at 12/28/2018  4:33 PM EDT ----- Regarding: Needs a televisit appt re:  polycythemia  ----- Message ----- From: Leory Plowman, Lab In [place] Sent: 12/28/2018   2:08 PM EDT To: Rosey Bath, MD

## 2018-12-29 ENCOUNTER — Ambulatory Visit: Payer: 59 | Admitting: Urology

## 2018-12-30 ENCOUNTER — Inpatient Hospital Stay (HOSPITAL_BASED_OUTPATIENT_CLINIC_OR_DEPARTMENT_OTHER): Payer: 59 | Admitting: Hematology and Oncology

## 2018-12-30 ENCOUNTER — Ambulatory Visit: Payer: 59 | Admitting: Urology

## 2018-12-30 DIAGNOSIS — D751 Secondary polycythemia: Secondary | ICD-10-CM

## 2018-12-30 DIAGNOSIS — F17210 Nicotine dependence, cigarettes, uncomplicated: Secondary | ICD-10-CM

## 2018-12-30 DIAGNOSIS — E538 Deficiency of other specified B group vitamins: Secondary | ICD-10-CM | POA: Diagnosis not present

## 2018-12-30 DIAGNOSIS — D7589 Other specified diseases of blood and blood-forming organs: Secondary | ICD-10-CM | POA: Diagnosis not present

## 2018-12-30 DIAGNOSIS — D500 Iron deficiency anemia secondary to blood loss (chronic): Secondary | ICD-10-CM

## 2018-12-30 NOTE — Progress Notes (Signed)
[facility]  [address] [place], [place] [address] Phone: [phone]  Fax: [phone]   Telemedicine Office Visit:  12/30/2018  Referring physician: Excell Seltzer, MD  I connected with Brenda Hawkins on 12/30/18 at 2:15 PM EDT by videoconferencing and verified that I was speaking with the correct person using 2 identifiers.  The patient was at home.  I discussed the limitations, risk, security and privacy concerns of performing an evaluation and management service by videoconferencing and the availability of in person appointments.  I also discussed with the patient that there may be a patient responsible charge related to this service.  The patient expressed understanding and agreed to proceed.    Chief Complaint: Brenda Hawkins is a 53 y.o. female s/p gastric bypass surgery with iron deficiency anemia and B12 deficiency who is seen for 2 month assessment.  HPI: The patient was last seen in medical oncology clinic on 11/02/2018.  At that time, she had significant pain and hematuria secondary to nephrolithiasis.  Hematocrit was 46.1, hemoglobin 16.4, and MCV 107.  Ferritin was 111.  She did not receive Venofer.  She received B12.  She was referred to the ER.  She was seen in the ER by Dr Modena Slater, urologist.  She was discharged home with pain medication to help with severe bilateral flank pain c/w stent related pain.  She was scheduled to follow-up with urology for left ureteral stent removal and right ureteroscopy with laser lithotripsy and ureteral stent exchange.  Notes reviewed.  She underwent cystoscopy, right ureteroscopy and stone removal, ureteroscopic laser lithotripsy, right ureteral stent placement, left ureteral stent removal, and right retrograde pyelography on 11/11/2018 by Dr Glena Norfolk.  She underwent right stent removal on 11/17/2018 by Dr Legrand Rams.  She has not had her follow-up ultrasound.  Notes reviewed.   Symptomatically, she notes that pain related to her kidney issue "comes and goes".  She has 3 good bad days with pain then 3 good days.  She denies any hematuria.  She denies any other symptoms.  She states that she is smoking 1 - 1/2 packs/day second to kidney related pain.     Past Medical History:  Diagnosis Date  . Anemia   . Anxiety   . Asthma   . Diabetes mellitus type II   . GERD (gastroesophageal reflux disease)   . Hypertension   . Obesity   . Osteopenia   . Sleep apnea   . 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 Vitamin B12 deficiency    since bariatric surgery    Past Surgical History:  Procedure Laterality Date  . CYSTOSCOPY W/ URETERAL STENT PLACEMENT Left 11/11/2018   Procedure: CYSTOSCOPY WITH STENT REMOVAL;  Surgeon: Riki Altes, MD;  Location: [facility] OR[facility];  Service: Urology;  Laterality: Left;  . CYSTOSCOPY WITH HOLMIUM LASER LITHOTRIPSY Left 10/24/2018   Procedure: CYSTOSCOPY WITH HOLMIUM LASER LITHOTRIPSy;  Surgeon: Crista Elliot, MD;  Location: [facility];  Service: Urology;  Laterality: Left;  . CYSTOSCOPY WITH STENT PLACEMENT Bilateral 10/24/2018   Procedure: CYSTOSCOPY WITH STENT PLACEMENT;  Surgeon: Crista Elliot, MD;  Location: [facility];  Service: Urology;  Laterality: Bilateral;  . CYSTOSCOPY/URETEROSCOPY/HOLMIUM LASER/STENT PLACEMENT Right 11/11/2018   Procedure: CYSTOSCOPY/URETEROSCOPY/HOLMIUM LASER/STENT EXCHANGE;  Surgeon: Riki Altes, MD;  Location: [facility];  Service: Urology;  Laterality: Right;  . ESOPHAGOGASTRODUODENOSCOPY (EGD) WITH PROPOFOL N/A 06/01/2018   Procedure: ESOPHAGOGASTRODUODENOSCOPY (EGD) WITH PROPOFOL;  Surgeon: Toney Reil,  MD;  Location: [facility];  Service: Gastroenterology;  Laterality: N/A;  . 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 REDUCTION MAMMAPLASTY Right 1992  . URETEROSCOPY Left 10/24/2018   Procedure: URETEROSCOPY;  Surgeon: Crista Elliot, MD;  Location: [facility];  Service: Urology;  Laterality: Left;    Family History   Problem Relation Age of Onset  . Heart disease Mother        valve replacement surgery  . Coronary artery disease Father   . Breast cancer Neg Hx     Social History:  reports that she has been smoking. She has been smoking about 1.00 pack per day. She has never used smokeless tobacco. She reports that she does not drink alcohol or use drugs.  She quit smoking > 20 years ago, but started smoking again 3-4 years ago.  She smokes 1 pack a day.  She has a dog named Doctor, general practice.  The patient lives in [place]. Her husband's name is [name].   Participants in the patient's visit and their role in the encounter included the patient and Delano Metz, RN, today.  The intake visit was provided by Delano Metz, RN.     Allergies:  Allergies  Allergen Reactions  . Codeine Sulfate Other (See Comments)    Reaction: "violently ill"  . Penicillins Other (See Comments)    Did it involve swelling of the face/tongue/throat, SOB, or low BP? Unknown Did it involve sudden or severe rash/hives, skin peeling, or any reaction on the inside of your mouth or nose? Unknown Did you need to seek medical attention at a hospital or doctor's office? Unknown When did it last happen?Childhood allergy If all above answers are "NO", may proceed with cephalosporin use.   . Latex Itching and Rash    Current Medications: Current Outpatient Medications  Medication Sig Dispense Refill  . acetaminophen (TYLENOL) 500 MG tablet Take 500 mg by mouth every 6 (six) hours as needed for moderate pain.     . calcium carbonate (TUMS - DOSED IN MG ELEMENTAL CALCIUM) 500 MG chewable tablet Chew 1 tablet by mouth daily as needed for indigestion or heartburn.     No current facility-administered medications for this visit.    Facility-Administered Medications Ordered in Other Visits  Medication Dose Route Frequency Provider Last Rate Last Dose  . sodium chloride 0.9 % injection 10 mL  10 mL Intracatheter PRN Jeralyn Ruths, MD        Review of Systems:  GENERAL:  Feels good except for "kidney stone pain".  No fevers, sweats.  No new weight. PERFORMANCE STATUS (ECOG):  1 HEENT:  No visual changes, runny nose, sore throat, mouth sores or tenderness. Lungs: No shortness of breath or cough.  No hemoptysis. Cardiac:  No chest pain, palpitations, orthopnea, or PND. GI:  No nausea, vomiting, diarrhea, constipation, melena or hematochezia. GU:  Kidney stones s/p stent, lithotripsy, and stent removal.  No hematuria. Musculoskeletal:  No back pain.  No joint pain.  No muscle tenderness. Extremities:  No pain or swelling. Skin:  No rashes or skin changes. Neuro:  No headache, numbness or weakness, balance or coordination issues. Endocrine:  No diabetes, thyroid issues, hot flashes or night sweats. Psych:  No mood changes, depression or anxiety. Pain:  Off and on pain related to "kidney stone pain". Review of systems:  All other systems reviewed and found to be negative.  Physical Exam: There were no vitals taken for this visit. GENERAL:  Well developed, well nourished,  woman sitting comfortably at home in no acute distress. MENTAL STATUS:  Alert and oriented to person, place and time. HEAD:  Short blonde hair.  Normocephalic, atraumatic, face symmetric, no Cushingoid features. EYES:  Green eyes.  No conjunctivitis or scleral icterus. NEUROLOGICAL: Unremarkable. PSYCH:  Appropriate.    Appointment on 12/28/2018  Component Date Value Ref Range Status  . Ferritin 12/28/2018 97  11 - 307 ng/mL Final   Performed at [facility], [address] [place] [place], [place] [address]  . WBC 12/28/2018 7.3  4.0 - 10.5 K/uL Final  . RBC 12/28/2018 4.45  3.87 - 5.11 MIL/uL Final  . Hemoglobin 12/28/2018 17.2* 12.0 - 15.0 g/dL Final  . HCT 71/69/6789 47.9* 36.0 - 46.0 % Final  . MCV 12/28/2018 107.6* 80.0 - 100.0 fL Final  . MCH 12/28/2018 38.7* 26.0 - 34.0 pg Final  . MCHC 12/28/2018 35.9  30.0 - 36.0 g/dL  Final  . RDW 38/06/1750 12.6  11.5 - 15.5 % Final  . Platelets 12/28/2018 265  150 - 400 K/uL Final  . nRBC 12/28/2018 0.0  0.0 - 0.2 % Final  . Neutrophils Relative % 12/28/2018 71  % Final  . Neutro Abs 12/28/2018 5.2  1.7 - 7.7 K/uL Final  . Lymphocytes Relative 12/28/2018 20  % Final  . Lymphs Abs 12/28/2018 1.5  0.7 - 4.0 K/uL Final  . Monocytes Relative 12/28/2018 7  % Final  . Monocytes Absolute 12/28/2018 0.5  0.1 - 1.0 K/uL Final  . Eosinophils Relative 12/28/2018 1  % Final  . Eosinophils Absolute 12/28/2018 0.0  0.0 - 0.5 K/uL Final  . Basophils Relative 12/28/2018 0  % Final  . Basophils Absolute 12/28/2018 0.0  0.0 - 0.1 K/uL Final  . Immature Granulocytes 12/28/2018 1  % Final  . Abs Immature Granulocytes 12/28/2018 0.05  0.00 - 0.07 K/uL Final   Performed at [facility], [address] [place], [place] [address]    Assessment:  Brenda Hawkins is a 53 y.o. female status post gastric bypass surgery in 03/2002 and subsequent development of iron deficiency anemia and B12 deficiency.  She is intolerant of oral iron.  She denies any melena or hematochezia.  Stool guaiacs were negative on 01/02/2015.  Diet is poor.  She denies any melena or hematochezia.  She has never had a colonoscopy.  Patient refuses colonoscopy.  She has B12 deficiency.  B12 was 172 on 06/10/2014 and 250 on 12/31/2016.  She receives B12 injections (began 01/16/2015; last 12/28/2018).  Folate was 12.8 on 11/02/2018.  She has iron deficiency.  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  She received Venofer x4 (01/16/2015 - 02/07/2015), 10/10/2015, 05/06/2017, x 3 (05/08/2017 - 05/22/2017), and x3 (02/09/2018 - 02/23/2018).   Ferritin has been followed: 1.5 on 09/19/2014, 59 on 02/07/2015, 10 on 03/16/2015, 6 on 06/16/2015, 5 on 10/09/2015, 5 on 01/01/2016, 6 on 04/01/2016, 14 on 07/08/2016, 13 on 08/06/2016, 5 on 05/07/2017, 35 on 08/08/2017, 6 on 11/04/2017, 7 on 02/03/2018, 19 on  05/05/2018, 16 on 08/04/2018, 111 on 11/02/2018, 160 on 11/30/2018, and 97 on 12/28/2018.  EGD on 06/01/2018 revealed patent Roux-en-Y gastrojejunostomy characterized by an intact staple line, visible sutures, congestion, erythema, friable mucosa and ulceration.  There was a large clean based gastrojejunal anastomotic ulcer.  There was a normal duodenal bulb and second portion of the duodenum. There was bleeding erosive gastropathy treated with bipolar cautery.  There was normal gastroesophageal junction and esophagus.  Biopsies  were negative for malignancy.  There was no H pylori. She is on Nexium 40 mg BID.  Abdomen and pelvic CT on 10/23/2018 revealed moderate to severe right hydronephrosis due to 10 mm calculus at the right UPJ. There was moderate left hydronephrosis due to 4 mm calculus in distal left ureter and 3 mm calculus at left UVJ.  There was tiny bilateral intrarenal calculi.  There was cholelithiasis and colonic diverticulosis. She underwent bilateral stent placement on 10/24/2018.  She underwent cystoscopy, right ureteroscopy and stone removal, ureteroscopic laser lithotripsy, right ureteral stent placement, left ureteral stent removal, and right retrograde pyelography on 11/11/2018.  She underwent right stent removal on 11/17/2018.   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  She denies any liver disease.  Thyroid studies have been normal in the past.  Labs on 07/08/2016 revealed the following normal labs:  folate (11.4), TSH (1.563), and reticulocyte count (1.7%).  Folate was 6.5 on 08/08/2017.  TSH was normal on 09/08/2017 and 05/05/2018.  She has a history of sleep apnea.  She previously used CPAP when she was heavier.  She is not using CPAP now.   She smokes 1 1/2 packs per day.  She has an increasing hematocrit despite low iron stores suggestive of secondary erythrocytosis due to smoking, sleep apnea, or a myeloproliferative disorder.  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  She was taking over 50 BC powders a week for chronic headaches.  She had tinnitus.  Salicylate level was 39.6 (2.8-30).  She was hydrated in the ER on 07/09/2016.  Follow-up salicylate level was 16.3.  Bilateral mammogram on 05/29/2017 revealed calcifications and possible asymmetry in the right breast.  Left breast was unremarkable.  Diagnostic mammogram and ultrasound of the right breast were ordered, but not performed.  Patient refuses further breast imaging.  Symptomatically, she is slowly improving s/p recent kidney stone, stent placement and removal.  Plan: 1.   Review labs from 12/28/2018. 2.   Iron deficiency  Hematocrit 45.7.  Hemoglobin 15.9.  MCV 109.5.  Ferritin 97.  No Venofer needed. 3.   B12 deficiency  B12 received on 12/28/2018.  Continue B12 injections monthly. 4.   Macrocytic RBC indices  Etiology remains unclear.  No known liver disease or MDS.  B12 deficiency is being treated.  Folate was 12.8 on 11/02/2018.  Follow-up TSH (last checked on 05/05/2018).  Consider bone marrow if counts decline (r/o MDS). 5.   Nephrolithiasis  Patient s/p laser lithotripsy, stent placement and removal. 6.   Erythrocytosis  Patient with persistent erythrocytosis since 11/02/2018.  Hemoglobin 16.4-17.2.  Etiology likely secondary to smoking and possible sleep apnea (patient previously used CPAP for apnea).  RTC when convenient (soon) for labs (epo level, carbon monoxide level, JAK2 with reflex). 7.   Health maintenance  Discuss low dose chest CT program.  Patient agreeable.  Contact Glenna Fellows, RN re: low dose chest CT program.  Encourage smoking cessation. 8.   RTC as scheduled on 01/25/2019 for MD assessment, labs, and B12, +/- phlebotomy.   I discussed the assessment and treatment plan with the patient.  The patient was provided an opportunity to ask questions and all were answered.  The patient agreed with the plan and demonstrated an  understanding of the instructions.  The patient was advised to call back or seek an in person evaluation if the symptoms worsen or if the condition fails to improve as anticipated.  I provided 25 minutes (2:15 PM -  2:40 PM) of face-to-face video visit time during this this encounter and > 50% was spent counseling as documented under my assessment and plan.  [facility].   Rosey BathMelissa C Corcoran, MD, PhD  12/30/2018, 2:15 PM

## 2018-12-30 NOTE — Progress Notes (Signed)
Confirmed name, DOB, and address. Denies any concerns. Patient reports having "kidney stone" pain since February. Patient is seeing Dr. Richardo Hanks with [facility] .

## 2018-12-31 ENCOUNTER — Telehealth: Payer: Self-pay | Admitting: *Deleted

## 2018-12-31 ENCOUNTER — Other Ambulatory Visit: Payer: Self-pay | Admitting: Hematology and Oncology

## 2018-12-31 DIAGNOSIS — D751 Secondary polycythemia: Secondary | ICD-10-CM

## 2018-12-31 NOTE — Telephone Encounter (Signed)
Received referral for lung screening, contacted patient and planned to schedule lung screening next year when patient is 53 years old.

## 2019-01-01 ENCOUNTER — Telehealth: Payer: Self-pay | Admitting: Urology

## 2019-01-01 MED ORDER — ONDANSETRON 4 MG PO TBDP: 4.0000 mg | ORAL_TABLET | Freq: Three times a day (TID) | ORAL | 0 refills | Status: DC | PRN

## 2019-01-01 MED ORDER — OXYCODONE-ACETAMINOPHEN 5-325 MG PO TABS: 1.0000 | ORAL_TABLET | Freq: Four times a day (QID) | ORAL | 0 refills | Status: DC | PRN

## 2019-01-01 NOTE — Telephone Encounter (Signed)
Contacted patient and advised her of the prescriptions sent to her pharmacy.  I explained to her that she will need to go to the ED if she is not able to control the pain with rx or if she develops fever.  Patient expressed gratitude and understanding.  We will see her in the office on Monday.

## 2019-01-01 NOTE — Telephone Encounter (Signed)
Prescriptions were sent.  She needs to be seen in the ED if her pain does not improve, or she develops fever >100.5 degrees.  She has an appointment on Monday.

## 2019-01-01 NOTE — Telephone Encounter (Signed)
Patient has a history of kidney stones with most recent in February 2020.  She was up during the night with bilateral flank pain, nausea and vomiting.   She is requesting a prescription for nausea and pain to be sent to [facility] in [place].    Please advise.

## 2019-01-03 NOTE — Progress Notes (Signed)
01/04/2019 3:53 PM   Brenda Hawkins Sep 14, 1965 [id number]  Referring provider: Jinny Sanders, MD [address] [place], [place] [address]  Chief Complaint  Patient presents with  . Nephrolithiasis    HPI: Brenda Hawkins is a 53 year old Caucasian female with a history of nephrolithiasis who presents today for the evaluation of flank pain , nausea and vomiting.    She underwent left URS/LL/stent placement and then right URS/LL/stent placement with subsequent cystoscopy stent removal in 11/2018 for bilateral obstructing stones.  Stone composition was noted to be calcium oxalate monohydrate.  She has not had her RUS to check for silent hydronephrosis due to the pandemic and has not had her 24 hour metabolic workup.   She states since February 2020, she has had bilateral upper quadrant pain that radiates around to her mid back.  It also sometimes radiates to the top of her right shoulder.  She states the pain is so intense at time, she needs to take a narcotic pain medication.  The pain can last for hours.  She describes the pain as a deep pain.  She had nausea and vomiting over the weekend.  It is controlled with anti-emetics.  She has some days that are pain free, but on the days she has pain, the pain is less in the morning getting progressively worse as the day goes on.  She finds that she cannot sweep her floors without pain and she cannot bend over without pain.    UA today is positive for 3-10 RBC's.  Her KUB today did not identify any ureteral stones.   Her CT renal stone study today noted that in addition to multiple tiny 2-3 mm nonobstructive calculi in the collecting systems of both kidneys there is a 4 mm calculus at the right ureteropelvic junction. At this time, this is not associated with proximal right hydronephrosis to indicate obstruction. No other ureteral stones.     PMH: Past Medical History:  Diagnosis Date  . Anemia   . Anxiety   . Asthma   . Diabetes mellitus  type II   . GERD (gastroesophageal reflux disease)   . Hypertension   . Obesity   . Osteopenia   . Sleep apnea   . 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 Vitamin B12 deficiency    since bariatric surgery    Surgical History: Past Surgical History:  Procedure Laterality Date  . CYSTOSCOPY W/ URETERAL STENT PLACEMENT Left 11/11/2018   Procedure: CYSTOSCOPY WITH STENT REMOVAL;  Surgeon: Abbie Sons, MD;  Location: [facility];  Service: Urology;  Laterality: Left;  . CYSTOSCOPY WITH HOLMIUM LASER LITHOTRIPSY Left 10/24/2018   Procedure: CYSTOSCOPY WITH HOLMIUM LASER LITHOTRIPSy;  Surgeon: Lucas Mallow, MD;  Location: [facility];  Service: Urology;  Laterality: Left;  . CYSTOSCOPY WITH STENT PLACEMENT Bilateral 10/24/2018   Procedure: CYSTOSCOPY WITH STENT PLACEMENT;  Surgeon: Lucas Mallow, MD;  Location: [facility];  Service: Urology;  Laterality: Bilateral;  . CYSTOSCOPY/URETEROSCOPY/HOLMIUM LASER/STENT PLACEMENT Right 11/11/2018   Procedure: CYSTOSCOPY/URETEROSCOPY/HOLMIUM LASER/STENT EXCHANGE;  Surgeon: Abbie Sons, MD;  Location: [facility];  Service: Urology;  Laterality: Right;  . ESOPHAGOGASTRODUODENOSCOPY (EGD) WITH PROPOFOL N/A 06/01/2018   Procedure: ESOPHAGOGASTRODUODENOSCOPY (EGD) WITH PROPOFOL;  Surgeon: Lin Landsman, MD;  Location: [facility];  Service: Gastroenterology;  Laterality: N/A;  . 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 REDUCTION MAMMAPLASTY Right 1992  . URETEROSCOPY Left 10/24/2018   Procedure: URETEROSCOPY;  Surgeon: Marton Redwood  III, MD;  Location: [facility];  Service: Urology;  Laterality: Left;    Home Medications:  Allergies as of 01/04/2019      Reactions   Codeine Sulfate Other (See Comments)   Reaction: "violently ill"   Penicillins Other (See Comments)   Did it involve swelling of the face/tongue/throat, SOB, or low BP? Unknown Did it involve sudden or severe rash/hives, skin peeling, or any reaction on the inside of your mouth or nose? Unknown Did you need to  seek medical attention at a hospital or doctor's office? Unknown When did it last happen?Childhood allergy If all above answers are "NO", may proceed with cephalosporin use.   Latex Itching, Rash      Medication List       Accurate as of January 04, 2019  3:53 PM. Always use your most recent med list.        acetaminophen 500 MG tablet Commonly known as:  TYLENOL Take 500 mg by mouth every 6 (six) hours as needed for moderate pain.   calcium carbonate 500 MG chewable tablet Commonly known as:  TUMS - dosed in mg elemental calcium Chew 1 tablet by mouth daily as needed for indigestion or heartburn.   ondansetron 4 MG disintegrating tablet Commonly known as:  ZOFRAN-ODT Take 1 tablet (4 mg total) by mouth every 8 (eight) hours as needed for nausea or vomiting.   oxyCODONE-acetaminophen 5-325 MG tablet Commonly known as:  PERCOCET/ROXICET Take 1 tablet by mouth every 6 (six) hours as needed for severe pain.   tamsulosin 0.4 MG Caps capsule Commonly known as:  FLOMAX Take 1 capsule (0.4 mg total) by mouth daily.       Allergies:  Allergies  Allergen Reactions  . Codeine Sulfate Other (See Comments)    Reaction: "violently ill"  . Penicillins Other (See Comments)    Did it involve swelling of the face/tongue/throat, SOB, or low BP? Unknown Did it involve sudden or severe rash/hives, skin peeling, or any reaction on the inside of your mouth or nose? Unknown Did you need to seek medical attention at a hospital or doctor's office? Unknown When did it last happen?Childhood allergy If all above answers are "NO", may proceed with cephalosporin use.   . Latex Itching and Rash    Family History: Family History  Problem Relation Age of Onset  . Heart disease Mother        valve replacement surgery  . Coronary artery disease Father   . Breast cancer Neg Hx     Social History:  reports that she has been smoking. She has been smoking about 1.00 pack per day. She  has never used smokeless tobacco. She reports that she does not drink alcohol or use drugs.  ROS: UROLOGY Frequent Urination?: No Hard to postpone urination?: No Burning/pain with urination?: No Get up at night to urinate?: No Leakage of urine?: No Urine stream starts and stops?: No Trouble starting stream?: No Do you have to strain to urinate?: No Blood in urine?: No Urinary tract infection?: No Sexually transmitted disease?: No Injury to kidneys or bladder?: No Painful intercourse?: No Weak stream?: No Currently pregnant?: No Vaginal bleeding?: No Last menstrual period?: Postmenopausal  Gastrointestinal Nausea?: Yes Vomiting?: Yes Indigestion/heartburn?: Yes Diarrhea?: No Constipation?: No  Constitutional Fever: No Night sweats?: No Weight loss?: No Fatigue?: No  Skin Skin rash/lesions?: No Itching?: No  Eyes Blurred vision?: No Double vision?: No  Ears/Nose/Throat Sore throat?: No Sinus problems?: No  Hematologic/Lymphatic Swollen glands?: No  Easy bruising?: No  Cardiovascular Leg swelling?: No Chest pain?: No  Respiratory Cough?: No Shortness of breath?: No  Endocrine Excessive thirst?: No  Musculoskeletal Back pain?: Yes Joint pain?: No  Neurological Headaches?: No Dizziness?: No  Psychologic Depression?: No Anxiety?: No  Physical Exam: BP (!) 176/110 (BP Location: Left Arm, Patient Position: Sitting, Cuff Size: Normal)   Pulse 98   Ht 5' (1.524 m)   Wt 136 lb 12.8 oz (62.1 kg)   BMI 26.72 kg/m   Constitutional:  Well nourished. Alert and oriented, No acute distress. HEENT: [place] AT, moist mucus membranes.  Trachea midline, no masses. Cardiovascular: No clubbing, cyanosis, or edema. Respiratory: Normal respiratory effort, no increased work of breathing. Neurologic: Grossly intact, no focal deficits, moving all 4 extremities. Psychiatric: Normal mood and affect.  Laboratory Data: Lab Results  Component Value Date   WBC 7.3  12/28/2018   HGB 17.2 (H) 12/28/2018   HCT 47.9 (H) 12/28/2018   MCV 107.6 (H) 12/28/2018   PLT 265 12/28/2018    Lab Results  Component Value Date   CREATININE 0.75 11/02/2018    No results found for: PSA  No results found for: TESTOSTERONE  Lab Results  Component Value Date   HGBA1C 6.6 (H) 12/09/2014    Lab Results  Component Value Date   TSH 2.357 05/05/2018       Component Value Date/Time   CHOL 184 12/09/2014 0831   HDL 50.00 12/09/2014 0831   CHOLHDL 4 12/09/2014 0831   VLDL 23.0 12/09/2014 0831   LDLCALC 111 (H) 12/09/2014 0831    Lab Results  Component Value Date   AST 17 11/02/2018   Lab Results  Component Value Date   ALT 14 11/02/2018   No components found for: ALKALINEPHOPHATASE No components found for: BILIRUBINTOTAL  No results found for: ESTRADIOL  Urinalysis 3-10 RBC's.  See Epic.    I have reviewed the labs.   Pertinent Imaging: CLINICAL DATA:  53 year old female with history of kidney stones presenting with bilateral flank pain for the past several days.  EXAM: CT ABDOMEN AND PELVIS WITHOUT CONTRAST  TECHNIQUE: Multidetector CT imaging of the abdomen and pelvis was performed following the standard protocol without IV contrast.  COMPARISON:  CT the abdomen and pelvis 11/02/2018.  FINDINGS: Lower chest: Calcifications of the inferior mitral annulus. Atherosclerotic calcifications in the right coronary artery.  Hepatobiliary: No definite suspicious cystic or solid hepatic lesions are confidently identified on today's noncontrast CT examination. Multiple small calcified gallstones lying dependently in the gallbladder. No findings to suggest an acute cholecystitis at this time.  Pancreas: No definite pancreatic mass or peripancreatic fluid or inflammatory changes are noted on today's noncontrast CT examination.  Spleen: Unremarkable.  Adrenals/Urinary Tract: Multiple nonobstructive calculi are noted within the  collecting systems of both kidneys measuring 2-3 mm in size. In addition, at the right ureteropelvic junction there is a 4 mm calculus (axial image 68 of series 4). This is not associated with proximal right hydronephrosis to indicate obstruction at this time. No additional calculi are noted along the course of either ureter or within the lumen of the urinary bladder. No left-sided hydroureteronephrosis. Urinary bladder is normal in appearance. Bilateral adrenal glands are normal in appearance.  Stomach/Bowel: Postoperative changes of Roux-en-Y gastric bypass. No pathologic dilatation of small bowel or colon. Numerous colonic diverticulae are noted, most evident in the sigmoid colon, without surrounding inflammatory changes to suggest an acute diverticulitis at this time. Normal appendix.  Vascular/Lymphatic: Aortic atherosclerosis.  No lymphadenopathy noted in the abdomen or pelvis.  Reproductive: Uterus and ovaries are unremarkable in appearance.  Other: No significant volume of ascites.  No pneumoperitoneum.  Musculoskeletal: There are no aggressive appearing lytic or blastic lesions noted in the visualized portions of the skeleton.  IMPRESSION: 1. In addition to multiple tiny 2-3 mm nonobstructive calculi in the collecting systems of both kidneys there is a 4 mm calculus at the right ureteropelvic junction. At this time, this is not associated with proximal right hydronephrosis to indicate obstruction. No other ureteral stones. 2. Cholelithiasis without evidence of acute cholecystitis at this time. 3. Colonic diverticulosis without evidence to suggest an acute diverticulitis at this time. 4. Aortic atherosclerosis, in addition to at least right coronary artery disease. Please note that although the presence of coronary artery calcium documents the presence of coronary artery disease, the severity of this disease and any potential stenosis cannot be assessed on this  non-gated CT examination. Assessment for potential risk factor modification, dietary therapy or pharmacologic therapy may be warranted, if clinically indicated. 5. There are calcifications of the mitral annulus. Echocardiographic correlation for evaluation of potential valvular dysfunction may be warranted if clinically indicated. 6. Additional incidental findings, as above.   Electronically Signed   By: Vinnie Langton M.D.   On: 01/04/2019 14:14  I have independently reviewed the films and note the right proximal ureteral stone.    Assessment & Plan:    1. Right flank pain - right UPJ stone, 4 mm in size, seen on today's CT Renal stone study - patient would like to continue MET at this time, I have sent in a prescription for tamsulosin 0.4 mg daily to her pharmacy.  She is not in need of refills of her Percocet and Zofran at this time - will contact patient on Wednesday for status report - Patient is advised that if they should start to experience pain that is not able to be controlled with pain medication, intractable nausea and/or vomiting and/or fevers greater than 103 or shaking chills to contact the office immediately or seek treatment in the emergency department for emergent intervention  2. Gall stones - ? Gall stone pain as she is complaining of pain radiating to right shoulder  3. Bilateral nephrolithiasis - patient would like to pursue 24 hour metabolic workup after passage of right ureteral stone  4. Microscopic hematuria - patient has had three non-contrast CT's since February, left retrograde pyelogram on 10/24/2018, right retrograde pyelogram on 11/11/2018 and cystoscopy - no worrisome findings for the exception of stones - recheck UA in one year  Return for call patient Wednesday for status report .  These notes generated with voice recognition software. I apologize for typographical errors.  Royden Purl  [facility] [address]  [place], [place] [address] [phone]  Addendum:  Jeralene Huff out to patient today 01/06/2019 to check on status She is still having right flank pain and has gallstones and a small right UPJ stone.  I advised her to seek evaluation in the ED as she is still vomiting and having pain and low grade fevers.  She is not wanting to go at this time due to her fear of COVID-19.  She is also not wanting to schedule any procedure at this time.  I explained that her situation is difficult as she is having upper quadrant pain and pain radiating to her right shoulder which is more indicative of gallbladder pain and the ED  would be the best place to have it further evaluated.  She again refused.  She is having blood work drawn  tomorrow and would like to have blood work done to make sure her kidneys and liver are alright.  I stated I would add the blood work, but that I would not have the results until Monday.  She also did not get the Flomax filled as the pharmacy did not give it to her husband.  I will refill her pain medication and asked her to get the Flomax when she picked up the pain medication.  She stated she is able to push fluids at this time and is urinating often.  She is not having fevers over 99.  I emphasized to her that if she should be unable to keep fluids down, start having fevers of over 100 and/or chills or not be able to keep her pain under control to seek care in the ED immediately.

## 2019-01-04 ENCOUNTER — Ambulatory Visit (INDEPENDENT_AMBULATORY_CARE_PROVIDER_SITE_OTHER): Payer: 59 | Admitting: Urology

## 2019-01-04 ENCOUNTER — Ambulatory Visit
Admission: RE | Admit: 2019-01-04 | Discharge: 2019-01-04 | Disposition: A | Discharge status: Home / Self Care | Payer: 59 | Source: Ambulatory Visit | Attending: Urology | Admitting: Urology

## 2019-01-04 ENCOUNTER — Other Ambulatory Visit: Payer: Self-pay

## 2019-01-04 ENCOUNTER — Encounter: Payer: Self-pay | Admitting: Urology

## 2019-01-04 VITALS — BP 176/110 | HR 98 | Ht 60.0 in | Wt 136.8 lb

## 2019-01-04 DIAGNOSIS — Z87442 Personal history of urinary calculi: Secondary | ICD-10-CM

## 2019-01-04 DIAGNOSIS — K8020 Calculus of gallbladder without cholecystitis without obstruction: Secondary | ICD-10-CM | POA: Diagnosis not present

## 2019-01-04 DIAGNOSIS — N201 Calculus of ureter: Secondary | ICD-10-CM

## 2019-01-04 DIAGNOSIS — R109 Unspecified abdominal pain: Secondary | ICD-10-CM | POA: Diagnosis not present

## 2019-01-04 DIAGNOSIS — K5730 Diverticulosis of large intestine without perforation or abscess without bleeding: Secondary | ICD-10-CM | POA: Diagnosis not present

## 2019-01-04 DIAGNOSIS — N132 Hydronephrosis with renal and ureteral calculous obstruction: Secondary | ICD-10-CM | POA: Diagnosis not present

## 2019-01-04 DIAGNOSIS — N200 Calculus of kidney: Secondary | ICD-10-CM

## 2019-01-04 LAB — MICROSCOPIC EXAMINATION

## 2019-01-04 LAB — URINALYSIS, COMPLETE
Bilirubin, UA: NEGATIVE
Glucose, UA: NEGATIVE
Ketones, UA: NEGATIVE
Leukocytes,UA: NEGATIVE
Nitrite, UA: NEGATIVE
Specific Gravity, UA: 1.02 (ref 1.005–1.030)
Urobilinogen, Ur: 0.2 mg/dL (ref 0.2–1.0)
pH, UA: 7 (ref 5.0–7.5)

## 2019-01-04 MED ORDER — TAMSULOSIN HCL 0.4 MG PO CAPS: 0.4000 mg | ORAL_CAPSULE | Freq: Every day | ORAL | 3 refills | Status: DC

## 2019-01-06 ENCOUNTER — Telehealth: Payer: Self-pay | Admitting: Urology

## 2019-01-06 DIAGNOSIS — N201 Calculus of ureter: Secondary | ICD-10-CM

## 2019-01-06 MED ORDER — OXYCODONE-ACETAMINOPHEN 5-325 MG PO TABS: 1.0000 | ORAL_TABLET | ORAL | 0 refills | Status: DC | PRN

## 2019-01-06 NOTE — Telephone Encounter (Signed)
Would you put in orders for a CMP and CBC for Brenda Hawkins in [place]?  She is going tomorrow for some other labs with Dr. Merlene Pulling.

## 2019-01-06 NOTE — Telephone Encounter (Signed)
Labs entered.

## 2019-01-07 ENCOUNTER — Inpatient Hospital Stay: Payer: 59

## 2019-01-07 ENCOUNTER — Other Ambulatory Visit
Admission: RE | Admit: 2019-01-07 | Discharge: 2019-01-07 | Disposition: A | Discharge status: Home / Self Care | Payer: 59 | Attending: Urology | Admitting: Urology

## 2019-01-07 ENCOUNTER — Other Ambulatory Visit: Payer: Self-pay

## 2019-01-07 DIAGNOSIS — D509 Iron deficiency anemia, unspecified: Secondary | ICD-10-CM | POA: Diagnosis not present

## 2019-01-07 DIAGNOSIS — D7589 Other specified diseases of blood and blood-forming organs: Secondary | ICD-10-CM

## 2019-01-07 DIAGNOSIS — E538 Deficiency of other specified B group vitamins: Secondary | ICD-10-CM | POA: Diagnosis not present

## 2019-01-07 DIAGNOSIS — D751 Secondary polycythemia: Secondary | ICD-10-CM

## 2019-01-07 DIAGNOSIS — N201 Calculus of ureter: Secondary | ICD-10-CM | POA: Diagnosis not present

## 2019-01-07 LAB — CBC WITH DIFFERENTIAL/PLATELET
Abs Immature Granulocytes: 0.07 10*3/uL (ref 0.00–0.07)
Basophils Absolute: 0 10*3/uL (ref 0.0–0.1)
Basophils Relative: 0 %
Eosinophils Absolute: 0 10*3/uL (ref 0.0–0.5)
Eosinophils Relative: 0 %
HCT: 46.7 % — ABNORMAL HIGH (ref 36.0–46.0)
Hemoglobin: 16.6 g/dL — ABNORMAL HIGH (ref 12.0–15.0)
Immature Granulocytes: 1 %
Lymphocytes Relative: 12 %
Lymphs Abs: 1.1 10*3/uL (ref 0.7–4.0)
MCH: 38 pg — ABNORMAL HIGH (ref 26.0–34.0)
MCHC: 35.5 g/dL (ref 30.0–36.0)
MCV: 106.9 fL — ABNORMAL HIGH (ref 80.0–100.0)
Monocytes Absolute: 0.6 10*3/uL (ref 0.1–1.0)
Monocytes Relative: 7 %
Neutro Abs: 6.7 10*3/uL (ref 1.7–7.7)
Neutrophils Relative %: 80 %
Platelets: 342 10*3/uL (ref 150–400)
RBC: 4.37 MIL/uL (ref 3.87–5.11)
RDW: 12.5 % (ref 11.5–15.5)
WBC: 8.5 10*3/uL (ref 4.0–10.5)
nRBC: 0 % (ref 0.0–0.2)

## 2019-01-07 LAB — COMPREHENSIVE METABOLIC PANEL
ALT: 10 U/L (ref 0–44)
AST: 13 U/L — ABNORMAL LOW (ref 15–41)
Albumin: 4.1 g/dL (ref 3.5–5.0)
Alkaline Phosphatase: 88 U/L (ref 38–126)
Anion gap: 10 (ref 5–15)
BUN: 15 mg/dL (ref 6–20)
CO2: 23 mmol/L (ref 22–32)
Calcium: 9.2 mg/dL (ref 8.9–10.3)
Chloride: 101 mmol/L (ref 98–111)
Creatinine, Ser: 0.52 mg/dL (ref 0.44–1.00)
GFR calc Af Amer: >60 mL/min (ref >60)
GFR calc non Af Amer: >60 mL/min (ref >60)
Glucose, Bld: 125 mg/dL — ABNORMAL HIGH (ref 70–99)
Potassium: 3.6 mmol/L (ref 3.5–5.1)
Sodium: 134 mmol/L — ABNORMAL LOW (ref 135–145)
Total Bilirubin: 0.3 mg/dL (ref 0.3–1.2)
Total Protein: 8.4 g/dL — ABNORMAL HIGH (ref 6.5–8.1)

## 2019-01-07 LAB — CULTURE, URINE COMPREHENSIVE

## 2019-01-08 ENCOUNTER — Telehealth: Payer: Self-pay | Admitting: Hematology and Oncology

## 2019-01-08 LAB — ERYTHROPOIETIN: Erythropoietin: 19.3 m[IU]/mL — ABNORMAL HIGH (ref 2.6–18.5)

## 2019-01-08 LAB — CARBON MONOXIDE, BLOOD (PERFORMED AT REF LAB): Carbon Monoxide, Blood: 11.8 % — ABNORMAL HIGH (ref 0.0–3.6)

## 2019-01-08 NOTE — Telephone Encounter (Signed)
Re:  Labs  Results of labs reviewed with patient.  Hemoglobin remains elevated likely due to smoking.  Carbon monoxide level increased likely due to smoking.  Compared to prior level.  Patient denies any sleepiness or improvement when going outside.  She is currently "dealing with a kidney stone".  Discussed home monitor for CO2 to ensure no environmental exposure.  Discussed possible phlebotomy.  Patient stated that she would cut back on smoking.   Rosey Bath

## 2019-01-11 ENCOUNTER — Telehealth: Payer: Self-pay | Admitting: Urology

## 2019-01-11 NOTE — Telephone Encounter (Signed)
Brenda Hawkins is still having bilateral upper abdominal pain with nausea and vomiting.  She has not passed a fragment.  She will come in tomorrow to see Dr. Lonna Cobb to discuss possible surgery for her stones.

## 2019-01-12 ENCOUNTER — Ambulatory Visit (INDEPENDENT_AMBULATORY_CARE_PROVIDER_SITE_OTHER): Payer: 59 | Admitting: Urology

## 2019-01-12 ENCOUNTER — Encounter: Payer: Self-pay | Admitting: Urology

## 2019-01-12 ENCOUNTER — Other Ambulatory Visit: Payer: Self-pay

## 2019-01-12 VITALS — BP 183/117 | HR 112 | Temp 98.4°F | Ht 60.0 in | Wt 135.0 lb

## 2019-01-12 DIAGNOSIS — K8020 Calculus of gallbladder without cholecystitis without obstruction: Secondary | ICD-10-CM

## 2019-01-12 DIAGNOSIS — N201 Calculus of ureter: Secondary | ICD-10-CM

## 2019-01-12 LAB — URINALYSIS, COMPLETE
Bilirubin, UA: NEGATIVE
Glucose, UA: NEGATIVE
Ketones, UA: NEGATIVE
Leukocytes,UA: NEGATIVE
Nitrite, UA: NEGATIVE
Specific Gravity, UA: 1.02 (ref 1.005–1.030)
Urobilinogen, Ur: 0.2 mg/dL (ref 0.2–1.0)
pH, UA: 7 (ref 5.0–7.5)

## 2019-01-12 LAB — MICROSCOPIC EXAMINATION

## 2019-01-12 MED ORDER — OXYCODONE-ACETAMINOPHEN 5-325 MG PO TABS: 1.0000 | ORAL_TABLET | ORAL | 0 refills | Status: DC | PRN

## 2019-01-12 NOTE — Progress Notes (Signed)
01/12/2019 3:49 PM   Brenda Hawkins [birth year] [id number]  Referring provider: Excell Seltzer, MD [address] [place], [place] [address]  Chief Complaint  Patient presents with   Follow-up    HPI: 53 year old female who initially presented late February 2020 with bilateral obstructing ureteral calculi.  She underwent left ureteroscopic stone removal by Dr. Alvester Morin and placement of a right ureteral stent.  She underwent ureteroscopy with laser lithotripsy of a 10 mm right proximal ureteral calculus and 5 mm renal calculus on 11/11/2018.  Her stent was removed on 11/17/2018.  She states she continues to have bilateral flank pain.  He is she states the pain radiates across her back region.  She also still complains of bilateral upper abdominal pain.  She states she had this pain prior to her stent placement and it persisted after her stents were removed.  She does have nausea.  She had a repeat CT scan on 01/04/2019 which showed bilateral, nonobstructing small renal calculi.  There was a 4 mm nonobstructing fragment in the right proximal ureter.  She saw Michiel Cowboy last week and presents for follow-up because of persistent pain.  She also has cholelithiasis.  Denies fever or chills.  Denies bothersome lower urinary tract symptoms.   PMH: Past Medical History:  Diagnosis Date   Anemia    Anxiety    Asthma    Diabetes mellitus type II    GERD (gastroesophageal reflux disease)    Hypertension    Obesity    Osteopenia    Sleep apnea    Urachal cyst 9/01   Vitamin B12 deficiency    since bariatric surgery    Surgical History: Past Surgical History:  Procedure Laterality Date   CYSTOSCOPY W/ URETERAL STENT PLACEMENT Left 11/11/2018   Procedure: CYSTOSCOPY WITH STENT REMOVAL;  Surgeon: Riki Altes, MD;  Location: [facility];  Service: Urology;  Laterality: Left;   CYSTOSCOPY WITH HOLMIUM LASER LITHOTRIPSY Left 10/24/2018   Procedure: CYSTOSCOPY WITH HOLMIUM  LASER LITHOTRIPSy;  Surgeon: Crista Elliot, MD;  Location: [facility];  Service: Urology;  Laterality: Left;   CYSTOSCOPY WITH STENT PLACEMENT Bilateral 10/24/2018   Procedure: CYSTOSCOPY WITH STENT PLACEMENT;  Surgeon: Crista Elliot, MD;  Location: [facility];  Service: Urology;  Laterality: Bilateral;   CYSTOSCOPY/URETEROSCOPY/HOLMIUM LASER/STENT PLACEMENT Right 11/11/2018   Procedure: CYSTOSCOPY/URETEROSCOPY/HOLMIUM LASER/STENT EXCHANGE;  Surgeon: Riki Altes, MD;  Location: [facility];  Service: Urology;  Laterality: Right;   ESOPHAGOGASTRODUODENOSCOPY (EGD) WITH PROPOFOL N/A 06/01/2018   Procedure: ESOPHAGOGASTRODUODENOSCOPY (EGD) WITH PROPOFOL;  Surgeon: Toney Reil, MD;  Location: [facility];  Service: Gastroenterology;  Laterality: N/A;   GASTRIC BYPASS  03/17/02   REDUCTION MAMMAPLASTY Right 1992   URETEROSCOPY Left 10/24/2018   Procedure: URETEROSCOPY;  Surgeon: Crista Elliot, MD;  Location: [facility];  Service: Urology;  Laterality: Left;    Home Medications:  Allergies as of 01/12/2019      Reactions   Codeine Sulfate Other (See Comments)   Reaction: "violently ill"   Penicillins Other (See Comments)   Did it involve swelling of the face/tongue/throat, SOB, or low BP? Unknown Did it involve sudden or severe rash/hives, skin peeling, or any reaction on the inside of your mouth or nose? Unknown Did you need to seek medical attention at a hospital or doctor's office? Unknown When did it last happen?Childhood allergy If all above answers are NO, may proceed with cephalosporin use.   Latex Itching, Rash  Medication List       Accurate as of Jan 12, 2019  3:49 PM. Always use your most recent med list.        acetaminophen 500 MG tablet Commonly known as:  TYLENOL Take 500 mg by mouth every 6 (six) hours as needed for moderate pain.   calcium carbonate 500 MG chewable tablet Commonly known as:  TUMS - dosed in mg elemental calcium Chew 1  tablet by mouth daily as needed for indigestion or heartburn.   ondansetron 4 MG disintegrating tablet Commonly known as:  ZOFRAN-ODT Take 1 tablet (4 mg total) by mouth every 8 (eight) hours as needed for nausea or vomiting.   oxyCODONE-acetaminophen 5-325 MG tablet Commonly known as:  Percocet Take 1 tablet by mouth every 4 (four) hours as needed for severe pain.   tamsulosin 0.4 MG Caps capsule Commonly known as:  FLOMAX Take 1 capsule (0.4 mg total) by mouth daily.       Allergies:  Allergies  Allergen Reactions   Codeine Sulfate Other (See Comments)    Reaction: "violently ill"   Penicillins Other (See Comments)    Did it involve swelling of the face/tongue/throat, SOB, or low BP? Unknown Did it involve sudden or severe rash/hives, skin peeling, or any reaction on the inside of your mouth or nose? Unknown Did you need to seek medical attention at a hospital or doctor's office? Unknown When did it last happen?Childhood allergy If all above answers are NO, may proceed with cephalosporin use.    Latex Itching and Rash    Family History: Family History  Problem Relation Age of Onset   Heart disease Mother        valve replacement surgery   Coronary artery disease Father    Breast cancer Neg Hx     Social History:  reports that she has been smoking. She has been smoking about 1.00 pack per day. She has never used smokeless tobacco. She reports that she does not drink alcohol or use drugs.  ROS: UROLOGY Frequent Urination?: No Hard to postpone urination?: No Burning/pain with urination?: No Get up at night to urinate?: No Leakage of urine?: No Urine stream starts and stops?: No Trouble starting stream?: Yes Do you have to strain to urinate?: No Blood in urine?: No Urinary tract infection?: No Sexually transmitted disease?: No Injury to kidneys or bladder?: No Painful intercourse?: No Weak stream?: No Currently pregnant?: No Vaginal bleeding?:  No Last menstrual period?: n  Gastrointestinal Nausea?: Yes Vomiting?: Yes Indigestion/heartburn?: No Diarrhea?: No Constipation?: No  Constitutional Fever: No Night sweats?: No Weight loss?: No Fatigue?: No  Skin Skin rash/lesions?: No Itching?: No  Eyes Blurred vision?: No Double vision?: No  Ears/Nose/Throat Sore throat?: No Sinus problems?: No  Hematologic/Lymphatic Swollen glands?: No Easy bruising?: No  Cardiovascular Leg swelling?: No Chest pain?: No  Respiratory Cough?: No Shortness of breath?: No  Endocrine Excessive thirst?: No  Musculoskeletal Back pain?: No Joint pain?: No  Neurological Headaches?: No Dizziness?: No  Psychologic Depression?: No Anxiety?: No  Physical Exam: BP (!) 183/117    Pulse (!) 112    Temp 98.4 F (36.9 C)    Ht 5' (1.524 m)    Wt 135 lb (61.2 kg)    BMI 26.37 kg/m   Constitutional:  Alert and oriented, No acute distress. HEENT: [place] AT, moist mucus membranes.  Trachea midline, no masses. Cardiovascular: No clubbing, cyanosis, or edema. Respiratory: Normal respiratory effort, no increased work of breathing. Skin:  No rashes, bruises or suspicious lesions. Neurologic: Grossly intact, no focal deficits, moving all 4 extremities. Psychiatric: Normal mood and affect.  Imaging: CT was personally reviewed Results for orders placed during the hospital encounter of 01/04/19  CT RENAL STONE STUDY   Narrative CLINICAL DATA:  53 year old female with history of kidney stones presenting with bilateral flank pain for the past several days.  EXAM: CT ABDOMEN AND PELVIS WITHOUT CONTRAST  TECHNIQUE: Multidetector CT imaging of the abdomen and pelvis was performed following the standard protocol without IV contrast.  COMPARISON:  CT the abdomen and pelvis 11/02/2018.  FINDINGS: Lower chest: Calcifications of the inferior mitral annulus. Atherosclerotic calcifications in the right coronary artery.  Hepatobiliary: No  definite suspicious cystic or solid hepatic lesions are confidently identified on today's noncontrast CT examination. Multiple small calcified gallstones lying dependently in the gallbladder. No findings to suggest an acute cholecystitis at this time.  Pancreas: No definite pancreatic mass or peripancreatic fluid or inflammatory changes are noted on today's noncontrast CT examination.  Spleen: Unremarkable.  Adrenals/Urinary Tract: Multiple nonobstructive calculi are noted within the collecting systems of both kidneys measuring 2-3 mm in size. In addition, at the right ureteropelvic junction there is a 4 mm calculus (axial image 68 of series 4). This is not associated with proximal right hydronephrosis to indicate obstruction at this time. No additional calculi are noted along the course of either ureter or within the lumen of the urinary bladder. No left-sided hydroureteronephrosis. Urinary bladder is normal in appearance. Bilateral adrenal glands are normal in appearance.  Stomach/Bowel: Postoperative changes of Roux-en-Y gastric bypass. No pathologic dilatation of small bowel or colon. Numerous colonic diverticulae are noted, most evident in the sigmoid colon, without surrounding inflammatory changes to suggest an acute diverticulitis at this time. Normal appendix.  Vascular/Lymphatic: Aortic atherosclerosis. No lymphadenopathy noted in the abdomen or pelvis.  Reproductive: Uterus and ovaries are unremarkable in appearance.  Other: No significant volume of ascites.  No pneumoperitoneum.  Musculoskeletal: There are no aggressive appearing lytic or blastic lesions noted in the visualized portions of the skeleton.  IMPRESSION: 1. In addition to multiple tiny 2-3 mm nonobstructive calculi in the collecting systems of both kidneys there is a 4 mm calculus at the right ureteropelvic junction. At this time, this is not associated with proximal right hydronephrosis to indicate  obstruction. No other ureteral stones. 2. Cholelithiasis without evidence of acute cholecystitis at this time. 3. Colonic diverticulosis without evidence to suggest an acute diverticulitis at this time. 4. Aortic atherosclerosis, in addition to at least right coronary artery disease. Please note that although the presence of coronary artery calcium documents the presence of coronary artery disease, the severity of this disease and any potential stenosis cannot be assessed on this non-gated CT examination. Assessment for potential risk factor modification, dietary therapy or pharmacologic therapy may be warranted, if clinically indicated. 5. There are calcifications of the mitral annulus. Echocardiographic correlation for evaluation of potential valvular dysfunction may be warranted if clinically indicated. 6. Additional incidental findings, as above.   Electronically Signed   By: Trudie Reed M.D.   On: 01/04/2019 14:14     Assessment & Plan:   53 year old female with bilateral upper abdominal pain as well as bilateral back pain.  Her most recent CT shows no obstructing calculi.  She does have a 4 mm right proximal ureteral fragment however this would not be causing left-sided pain and her pain is atypical.  She has a history of gastric bypass surgery  and does have cholelithiasis.  I did discuss ureteroscopic removal of her 4 mm fragment however there is no guarantee this will resolve her pain.  Will schedule a right upper quadrant ultrasound and she desires a general surgical opinion regarding her cholelithiasis prior to treatment of her nonobstructing right ureteral fragment.  Refill oxycodone was sent to her pharmacy.   Riki AltesScott C Jamillia Closson, MD  [facility] [address], [address] [place], [place] [address] [phone]

## 2019-01-13 LAB — CALR + JAK2 E12-15 + MPL (REFLEXED)

## 2019-01-13 LAB — JAK2 V617F, W REFLEX TO CALR/E12/MPL

## 2019-01-21 ENCOUNTER — Telehealth: Payer: Self-pay | Admitting: Urology

## 2019-01-21 MED ORDER — OXYCODONE-ACETAMINOPHEN 5-325 MG PO TABS: 1.0000 | ORAL_TABLET | ORAL | 0 refills | Status: DC | PRN

## 2019-01-21 NOTE — Telephone Encounter (Signed)
Limited refill was sent

## 2019-01-21 NOTE — Telephone Encounter (Signed)
Spoke with patient she has been taking Tylenol for the pain-since she ran out of Percocet. She states it has not helped. She is scheduled for her [place] tomorrow Please advise.

## 2019-01-21 NOTE — Telephone Encounter (Signed)
Left patient a message on her VM-as instructed.

## 2019-01-21 NOTE — Telephone Encounter (Signed)
Pt called office and states she needs more pain pills.  Please advise.

## 2019-01-22 ENCOUNTER — Other Ambulatory Visit: Payer: Self-pay

## 2019-01-22 ENCOUNTER — Ambulatory Visit
Admission: RE | Admit: 2019-01-22 | Discharge: 2019-01-22 | Disposition: A | Discharge status: Home / Self Care | Payer: 59 | Source: Ambulatory Visit | Attending: Urology | Admitting: Urology

## 2019-01-22 DIAGNOSIS — K8020 Calculus of gallbladder without cholecystitis without obstruction: Secondary | ICD-10-CM | POA: Diagnosis present

## 2019-01-25 ENCOUNTER — Ambulatory Visit: Payer: 59 | Admitting: Hematology and Oncology

## 2019-01-25 ENCOUNTER — Other Ambulatory Visit: Payer: 59

## 2019-01-25 ENCOUNTER — Telehealth: Payer: Self-pay

## 2019-01-25 ENCOUNTER — Other Ambulatory Visit: Payer: Self-pay | Admitting: Urology

## 2019-01-25 ENCOUNTER — Ambulatory Visit: Payer: 59

## 2019-01-25 DIAGNOSIS — K8020 Calculus of gallbladder without cholecystitis without obstruction: Secondary | ICD-10-CM

## 2019-01-25 NOTE — Telephone Encounter (Signed)
mychart sent.

## 2019-01-25 NOTE — Telephone Encounter (Signed)
-----   Message from Riki Altes, MD sent at 01/25/2019  2:38 PM EDT ----- Ultrasound shows cholelithiasis.  General surgery consult has been placed to evaluate for possible gallbladder source of her pain

## 2019-01-27 ENCOUNTER — Telehealth: Payer: Self-pay | Admitting: Urology

## 2019-01-27 NOTE — Telephone Encounter (Signed)
Informed patient regarding Ultrasound results-patient aware referral placed to General Surgery. Patient verbalized understanding.

## 2019-01-27 NOTE — Telephone Encounter (Signed)
Pt. Would like for clinical to call her and discuss her results because she doesn't understand mychart message that was sent to her.

## 2019-02-04 ENCOUNTER — Ambulatory Visit (INDEPENDENT_AMBULATORY_CARE_PROVIDER_SITE_OTHER): Payer: 59 | Admitting: Surgery

## 2019-02-04 ENCOUNTER — Encounter: Payer: Self-pay | Admitting: Surgery

## 2019-02-04 ENCOUNTER — Other Ambulatory Visit: Payer: Self-pay

## 2019-02-04 VITALS — BP 181/119 | HR 92 | Temp 97.5°F | Resp 16 | Ht 60.0 in | Wt 132.2 lb

## 2019-02-04 DIAGNOSIS — K8020 Calculus of gallbladder without cholecystitis without obstruction: Secondary | ICD-10-CM | POA: Diagnosis not present

## 2019-02-04 NOTE — Patient Instructions (Addendum)
Patient's surgery to be scheduled for 02/17/19 at [facility] with Dr. Earlene Plater  The patient is aware to have COVID-19 testing done on 02/12/19 at the [facility] building drive thru ([address] [facility] between 10:30 am and 12:30 pm. He is aware to isolate after, have no visitors, wash hands frequently, and avoid touching face.   The patient is aware she will be contacted by the Pre-Admission Testing Department to complete a phone interview sometime in the near future.  She is aware to call the day before surgery for her arrival time.   Patient aware to be NPO after midnight and have a driver.   She is aware to check in at [facility] entrance where he/she will be screened for the coronavirus and then sent to Same Day Surgery.   Patient aware that she may have no visitors and driver will need to wait in the car due to COVID-19 restrictions.   The patient verbalizes understanding of the above.   The patient is aware to call the office should she have further questions.     You will most likely be out of work 1-2 weeks for this surgery. You will return after your post-op appointment with a lifting restriction for approximately 4 more weeks.  You will be able to eat anything you would like to following surgery. But, start by eating a bland diet and advance this as tolerated. The Gallbladder diet is below, please go as closely by this diet as possible prior to surgery to avoid any further attacks.    Laparoscopic Cholecystectomy Laparoscopic cholecystectomy is surgery to remove the gallbladder. The gallbladder is located in the upper right part of the abdomen, behind the liver. It is a storage sac for bile, which is produced in the liver. Bile aids in the digestion and absorption of fats. Cholecystectomy is often done for inflammation of the gallbladder (cholecystitis). This condition is usually caused by a buildup of gallstones (cholelithiasis) in the gallbladder. Gallstones can block  the flow of bile, and that can result in inflammation and pain. In severe cases, emergency surgery may be required. If emergency surgery is not required, you will have time to prepare for the procedure. Laparoscopic surgery is an alternative to open surgery. Laparoscopic surgery has a shorter recovery time. Your common bile duct may also need to be examined during the procedure. If stones are found in the common bile duct, they may be removed. LET [facility] CARE PROVIDER KNOW ABOUT:  Any allergies you have.  All medicines you are taking, including vitamins, herbs, eye drops, creams, and over-the-counter medicines.  Previous problems you or members of your family have had with the use of anesthetics.  Any blood disorders you have.  Previous surgeries you have had.    Any medical conditions you have. RISKS AND COMPLICATIONS Generally, this is a safe procedure. However, problems may occur, including:  Infection.  Bleeding.  Allergic reactions to medicines.  Damage to other structures or organs.  A stone remaining in the common bile duct.  A bile leak from the cyst duct that is clipped when your gallbladder is removed.  The need to convert to open surgery, which requires a larger incision in the abdomen. This may be necessary if your surgeon thinks that it is not safe to continue with a laparoscopic procedure. BEFORE THE PROCEDURE  Ask your health care provider about:  Changing or stopping your regular medicines. This is especially important if you are taking diabetes medicines  or blood thinners.  Taking medicines such as aspirin and ibuprofen. These medicines can thin your blood. Do not take these medicines before your procedure if your health care provider instructs you not to.  Follow instructions from your health care provider about eating or drinking restrictions.  Let your health care provider know if you develop a cold or an infection before surgery.  Plan to have  someone take you home after the procedure.  Ask your health care provider how your surgical site will be marked or identified.  You may be given antibiotic medicine to help prevent infection. PROCEDURE  To reduce your risk of infection:  Your health care team will wash or sanitize their hands.  Your skin will be washed with soap.  An IV tube may be inserted into one of your veins.  You will be given a medicine to make you fall asleep (general anesthetic).  A breathing tube will be placed in your mouth.  The surgeon will make several small cuts (incisions) in your abdomen.  A thin, lighted tube (laparoscope) that has a tiny camera on the end will be inserted through one of the small incisions. The camera on the laparoscope will send a picture to a TV screen (monitor) in the operating room. This will give the surgeon a good view inside your abdomen.  A gas will be pumped into your abdomen. This will expand your abdomen to give the surgeon more room to perform the surgery.  Other tools that are needed for the procedure will be inserted through the other incisions. The gallbladder will be removed through one of the incisions.  After your gallbladder has been removed, the incisions will be closed with stitches (sutures), staples, or skin glue.  Your incisions may be covered with a bandage (dressing). The procedure may vary among health care providers and hospitals. AFTER THE PROCEDURE  Your blood pressure, heart rate, breathing rate, and blood oxygen level will be monitored often until the medicines you were given have worn off.  You will be given medicines as needed to control your pain.   This information is not intended to replace advice given to you by your health care provider. Make sure you discuss any questions you have with your health care provider.   Document Released: 08/26/2005 Document Revised: 05/17/2015 Document Reviewed: 04/07/2013 Elsevier Interactive Patient  Education 2016 Elsevier Inc.   Low-Fat Diet for Gallbladder Conditions A low-fat diet can be helpful if you have pancreatitis or a gallbladder condition. With these conditions, your pancreas and gallbladder have trouble digesting fats. A healthy eating plan with less fat will help rest your pancreas and gallbladder and reduce your symptoms. WHAT DO I NEED TO KNOW ABOUT THIS DIET?  Eat a low-fat diet.  Reduce your fat intake to less than 20-30% of your total daily calories. This is less than 50-60 g of fat per day.  Remember that you need some fat in your diet. Ask your dietician what your daily goal should be.  Choose nonfat and low-fat healthy foods. Look for the words "nonfat," "low fat," or "fat free."  As a guide, look on the label and choose foods with less than 3 g of fat per serving. Eat only one serving.  Avoid alcohol.  Do not smoke. If you need help quitting, talk with your health care provider.  Eat small frequent meals instead of three large heavy meals. WHAT FOODS CAN I EAT? Grains Include healthy grains and starches such as potatoes, wheat  bread, fiber-rich cereal, and brown rice. Choose whole grain options whenever possible. In adults, whole grains should account for 45-65% of your daily calories.  Fruits and Vegetables Eat plenty of fruits and vegetables. Fresh fruits and vegetables add fiber to your diet. Meats and Other Protein Sources Eat lean meat such as chicken and pork. Trim any fat off of meat before cooking it. Eggs, fish, and beans are other sources of protein. In adults, these foods should account for 10-35% of your daily calories. Dairy Choose low-fat milk and dairy options. Dairy includes fat and protein, as well as calcium.  Fats and Oils Limit high-fat foods such as fried foods, sweets, baked goods, sugary drinks.  Other Creamy sauces and condiments, such as mayonnaise, can add extra fat. Think about whether or not you need to use them, or use smaller  amounts or low fat options. WHAT FOODS ARE NOT RECOMMENDED?  High fat foods, such as:  [employer].  Ice cream.  [place] toast.  Sweet rolls.  Pizza.  Cheese bread.  Foods covered with batter, butter, creamy sauces, or cheese.  Fried foods.  Sugary drinks and desserts.  Foods that cause gas or bloating   This information is not intended to replace advice given to you by your health care provider. Make sure you discuss any questions you have with your health care provider.   Document Released: 08/31/2013 Document Reviewed: 08/31/2013 Elsevier Interactive Patient Education [employer]      Cholelithiasis  Cholelithiasis is also called "gallstones." It is a kind of gallbladder disease. The gallbladder is an organ that stores a liquid (bile) that helps you digest fat. Gallstones may not cause symptoms (may be silent gallstones) until they cause a blockage, and then they can cause pain (gallbladder attack). Follow these instructions at home:  Take over-the-counter and prescription medicines only as told by your doctor.  Stay at a healthy weight.  Eat healthy foods. This includes: ? Eating fewer fatty foods, like fried foods. ? Eating fewer refined carbs (refined carbohydrates). Refined carbs are breads and grains that are highly processed, like white bread and white rice. Instead, choose whole grains like whole-wheat bread and brown rice. ? Eating more fiber. Almonds, fresh fruit, and beans are healthy sources of fiber.  Keep all follow-up visits as told by your doctor. This is important. Contact a doctor if:  You have sudden pain in the upper right side of your belly (abdomen). Pain might spread to your right shoulder or your chest. This may be a sign of a gallbladder attack.  You feel sick to your stomach (are nauseous).  You throw up (vomit).  You have been diagnosed with gallstones that have no symptoms and you get: ? Belly pain. ? Discomfort, burning,  or fullness in the upper part of your belly (indigestion). Get help right away if:  You have sudden pain in the upper right side of your belly, and it lasts for more than 2 hours.  You have belly pain that lasts for more than 5 hours.  You have a fever or chills.  You keep feeling sick to your stomach or you keep throwing up.  Your skin or the whites of your eyes turn yellow (jaundice).  You have dark-colored pee (urine).  You have light-colored poop (stool). Summary  Cholelithiasis is also called "gallstones."  The gallbladder is an organ that stores a liquid (bile) that helps you digest fat.  Silent gallstones are gallstones that do not cause symptoms.  A gallbladder attack may cause sudden pain in the upper right side of your belly. Pain might spread to your right shoulder or your chest. If this happens, contact your doctor.  If you have sudden pain in the upper right side of your belly that lasts for more than 2 hours, get help right away. This information is not intended to replace advice given to you by your health care provider. Make sure you discuss any questions you have with your health care provider. Document Released: 02/12/2008 Document Revised: 05/12/2016 Document Reviewed: 05/12/2016 Elsevier Interactive Patient Education  2019 [employer].

## 2019-02-04 NOTE — H&P (View-Only) (Signed)
Surgical Clinic History and Physical  Referring provider:  Bedsole, Amy E, MD [address] [place], [place] [address]  HISTORY OF PRESENT ILLNESS (HPI):  53 y.o. female presents for evaluation of her RUQ abdominal pain. Patient reports she previously experienced less severe RUQ abdominal pain after eating fatty, greasy, or fried foods, but it never bothered her much until she was diagnosed and treated with lithotripsy for nephrolithiasis (Bell, 2/15 and Stoioff, 3/4), since which she's experienced much more severe RUQ abdominal pain, exacerbated by, but not limited to, eating fatty, greasy, or fried foods. She adds that since eliminating such foods, she may experience 3 or 4 days at a time without or with less severe abdominal pain, but it has not completely resolved despite her having been taking routine Percocet as prescribed since her procedure. She says her narcotic pain medication "numbs" her pain, but it then recurs as the medication "wears off". She otherwise denies N/V, constipation, diarrhea, fever/chills, CP, or SOB.  Of note, patient recalls she previously underwent laparoscopic gastric bypass in [place] (2003), after which she lost >170 lbs (>50% her former body weight). Though its been a while, she does not recall having been told she had gallstones at that time, but says she was one of her surgeon's first laparoscopic gastric bypass surgeries. 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 Lastly, she has also previously underwent abdominoplasty with umbilcal reconstruction, but no hernia repair or placement of mesh.  PAST MEDICAL HISTORY (PMH):  Past Medical History:  Diagnosis Date  . Anemia   . Anxiety   . Asthma   . Diabetes mellitus type II   . GERD (gastroesophageal reflux disease)   .  Hypertension   . Obesity   . Osteopenia   . Sleep apnea   . 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 Vitamin B12 deficiency    since bariatric surgery    PAST SURGICAL HISTORY (PSH):  Past Surgical History:  Procedure Laterality Date  . CYSTOSCOPY W/ URETERAL STENT PLACEMENT Left 11/11/2018   Procedure: CYSTOSCOPY WITH STENT REMOVAL;  Surgeon: Stoioff, Scott C, MD;  Location: [facility];  Service: Urology;  Laterality: Left;  . CYSTOSCOPY WITH HOLMIUM LASER LITHOTRIPSY Left 10/24/2018   Procedure: CYSTOSCOPY WITH HOLMIUM LASER LITHOTRIPSy;  Surgeon: Bell, Eugene D III, MD;  Location: [facility];  Service: Urology;  Laterality: Left;  . CYSTOSCOPY WITH STENT PLACEMENT Bilateral 10/24/2018   Procedure: CYSTOSCOPY WITH STENT PLACEMENT;  Surgeon: Bell, Eugene D III, MD;  Location: [facility];  Service: Urology;  Laterality: Bilateral;  . CYSTOSCOPY/URETEROSCOPY/HOLMIUM LASER/STENT PLACEMENT Right 11/11/2018   Procedure: CYSTOSCOPY/URETEROSCOPY/HOLMIUM LASER/STENT EXCHANGE;  Surgeon: Stoioff, Scott C, MD;  Location: [facility];  Service: Urology;  Laterality: Right;  . ESOPHAGOGASTRODUODENOSCOPY (EGD) WITH PROPOFOL N/A 06/01/2018   Procedure: ESOPHAGOGASTRODUODENOSCOPY (EGD) WITH PROPOFOL;  Surgeon: Vanga, Rohini Reddy, MD;  Location: [facility];  Service: Gastroenterology;  Laterality: N/A;  . 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 REDUCTION MAMMAPLASTY Right 1992  . URETEROSCOPY Left 10/24/2018   Procedure: URETEROSCOPY;  Surgeon: Bell, Eugene D III, MD;  Location: [facility];  Service: Urology;  Laterality: Left;    MEDICATIONS:  Prior to Admission medications   Medication Sig Start Date End Date Taking? Authorizing Provider  oxyCODONE-acetaminophen (PERCOCET) 5-325 MG tablet Take 1 tablet by mouth every 4 (four) hours as needed for severe   pain. 01/21/19  Yes Stoioff, Verna Czech, MD    ALLERGIES:  Allergies  Allergen Reactions  . Codeine Sulfate Other (See Comments)    Reaction: "violently ill"  . Penicillins Other (See Comments)     Did it involve swelling of the face/tongue/throat, SOB, or low BP? Unknown Did it involve sudden or severe rash/hives, skin peeling, or any reaction on the inside of your mouth or nose? Unknown Did you need to seek medical attention at a hospital or doctor's office? Unknown When did it last happen?Childhood allergy If all above answers are "NO", may proceed with cephalosporin use.   . Latex Itching and Rash    SOCIAL HISTORY:  Social History   Socioeconomic History  . Marital status: Married    Spouse name: Not on file  . Number of children: 1  . Years of education: Not on file  . Highest education level: Not on file  Occupational History  . Occupation: homemaker  Social Needs  . Financial resource strain: Not on file  . Food insecurity:    Worry: Not on file    Inability: Not on file  . Transportation needs:    Medical: Not on file    Non-medical: Not on file  Tobacco Use  . Smoking status: Current Every Day Smoker    Packs/day: 1.00  . Smokeless tobacco: Never Used  Substance and Sexual Activity  . Alcohol use: No  . Drug use: No  . Sexual activity: Yes    Birth control/protection: None  Lifestyle  . Physical activity:    Days per week: Not on file    Minutes per session: Not on file  . Stress: Not on file  Relationships  . Social connections:    Talks on phone: Not on file    Gets together: Not on file    Attends religious service: Not on file    Active member of club or organization: Not on file    Attends meetings of clubs or organizations: Not on file    Relationship status: Not on file  . Intimate partner violence:    Fear of current or ex partner: Not on file    Emotionally abused: Not on file    Physically abused: Not on file    Forced sexual activity: Not on file  Other Topics Concern  . Not on file  Social History Narrative   Sporadic with exercise    The patient currently resides (home / rehab facility / nursing home): Home The patient  normally is (ambulatory / bedbound): Ambulatory  FAMILY HISTORY:  Family History  Problem Relation Age of Onset  . Heart disease Mother        valve replacement surgery  . Coronary artery disease Father   . Breast cancer Neg Hx     Otherwise negative/non-contributory.  REVIEW OF SYSTEMS:  Constitutional: denies any other weight loss, fever, chills, or sweats  Eyes: denies any other vision changes, history of eye injury  ENT: denies sore throat, hearing problems  Respiratory: denies shortness of breath, wheezing  Cardiovascular: denies chest pain, palpitations  Gastrointestinal: abdominal pain, N/V, and bowel function as per HPI Musculoskeletal: denies any other joint pains or cramps  Skin: Denies any other rashes or skin discolorations except as per HPI Neurological: denies any other headache, dizziness, weakness  Psychiatric: Denies any other depression, anxiety   All other review of systems were otherwise negative   VITAL SIGNS:  BP (!) 181/119   Pulse 92  Temp (!) 97.5 F (36.4 C) (Temporal)   Resp 16   Ht 5' (1.524 m)   Wt 132 lb 3.2 oz (60 kg)   SpO2 97%   BMI 25.82 kg/m   PHYSICAL EXAM:  Constitutional:  -- Normal body habitus  -- Awake, alert, and oriented x3  Eyes:  -- Pupils equally round and reactive to light  -- No scleral icterus  Ear, nose, throat:  -- No jugular venous distension -- No nasal drainage, bleeding Pulmonary:  -- No crackles  -- Equal breath sounds bilaterally -- Breathing non-labored at rest Cardiovascular:  -- S1, S2 present  -- No pericardial rubs  Gastrointestinal:  -- Abdomen soft and non-distended with focal moderately severe RUQ tenderness to palpation, no guarding/rebound tenderness -- Post-surgical scars and umbilical reconstruction consistent with provided surgical history including umbilical reconstruction during abdominoplasty -- No abdominal masses appreciated, pulsatile or otherwise Musculoskeletal and Integumentary:   -- Wounds or skin discoloration: None appreciated except as described above (GI) -- Extremities: B/L UE and LE FROM, hands and feet warm, no edema  Neurologic:  -- Motor function: Intact and symmetric -- Sensation: Intact and symmetric  Labs:  CBC Latest Ref Rng & Units 01/07/2019 12/28/2018 11/30/2018  WBC 4.0 - 10.5 K/uL 8.5 7.3 7.5  Hemoglobin 12.0 - 15.0 g/dL 16.6(H) 17.2(H) 16.7(H)  Hematocrit 36.0 - 46.0 % 46.7(H) 47.9(H) 47.0(H)  Platelets 150 - 400 K/uL 342 265 271   CMP Latest Ref Rng & Units 01/07/2019 11/02/2018 10/23/2018  Glucose 70 - 99 mg/dL 130(Q125(H) 657(Q120(H) 469(G132(H)  BUN 6 - 20 mg/dL 15 20 29(B22(H)  Creatinine 0.44 - 1.00 mg/dL 2.840.52 1.320.75 4.400.83  Sodium 135 - 145 mmol/L 134(L) 141 142  Potassium 3.5 - 5.1 mmol/L 3.6 3.2(L) 3.3(L)  Chloride 98 - 111 mmol/L 101 102 110  CO2 22 - 32 mmol/L 23 26 25   Calcium 8.9 - 10.3 mg/dL 9.2 10.210.0 9.0  Total Protein 6.5 - 8.1 g/dL 7.2(Z8.4(H) 3.6(U8.4(H) 6.7  Total Bilirubin 0.3 - 1.2 mg/dL 0.3 0.4 0.4  Alkaline Phos 38 - 126 U/L 88 70 51  AST 15 - 41 U/L 13(L) 17 20  ALT 0 - 44 U/L 10 14 10    Imaging studies:  Limited RUQ Abdominal Ultrasound (01/22/2019) Cholelithiasis without inflammation. No other abnormality seen in the right upper quadrant of the abdomen.  CT Abdomen and Pelvis without contrast (01/04/2019) 1. In addition to multiple tiny 2-3 mm nonobstructive calculi in the collecting systems of both kidneys there is a 4 mm calculus at the right ureteropelvic junction. At this time, this is not associated with proximal right hydronephrosis to indicate obstruction. No other ureteral stones. 2. Cholelithiasis without evidence of acute cholecystitis at this time. 3. Colonic diverticulosis without evidence to suggest an acute diverticulitis at this time. 4. Aortic atherosclerosis, in addition to at least right coronary artery disease. Please note that although the presence of coronary artery calcium documents the presence of coronary artery  disease, the severity of this disease and any potential stenosis cannot be assessed on this non-gated CT examination. Assessment for potential risk factor modification, dietary therapy or pharmacologic therapy may be warranted, if clinically indicated. 5. There are calcifications of the mitral annulus.   Assessment/Plan: 53 y.o. female with severe symptomatic cholelithiasis +/- possible chronic cholecystitis, complicated by pertinent comorbidities including history of laparoscopic gastric bypass bariatric surgery, GERD with PUD, asthma, nephro-ureterolithiasis, vitamin B12 deficiency, osteopenia, generalized anxiety disorder, and former morbid obesity, DM, HTN, and OSA.               -  avoid/minimize foods with higher fat content (meats, cheeses/dairy, and fried)             - prefer low-fat vegetables, whole grains (wheat bread, ceareals, etc), and fruits until cholecystectomy             - if pain medication, prefer acetaminophen tramadol over morphine derivatives (oxycodone, dilaudid, etc), would also advise ibuprofen but patient previously advised to avoid due to reported history of gastric/pouch ulcer             - all risks, benefits, and alternatives to cholecystectomy were discussed with the patient, all of her questions were answered to patient's expressed satisfaction, patient expresses she wishes to proceed, and informed consent was obtained.             - will plan for laparoscopic cholecystectomy per patient request pending anesthesia and OR availability             - anticipate post-surgical follow-up 2 weeks after above surgery             - instructed to call if any questions or concerns  All of the above recommendations were discussed with the patient and patient's family, and all of patient's and family's questions were answered to their expressed satisfaction.  Thank you for the opportunity to participate in this patient's care.  -- Scherrie Gerlach Earlene Plater, MD, RPVI [place]:  [facility] General Surgery - Partnering for exceptional care. Office: [phone]

## 2019-02-04 NOTE — Progress Notes (Signed)
Surgical Clinic History and Physical  Referring provider:  Excell Seltzer, MD [address] [place], [place] [address]  HISTORY OF PRESENT ILLNESS (HPI):  53 y.o. female presents for evaluation of her RUQ abdominal pain. Patient reports she previously experienced less severe RUQ abdominal pain after eating fatty, greasy, or fried foods, but it never bothered her much until she was diagnosed and treated with lithotripsy for nephrolithiasis Alvester Morin, 2/15 and Stoioff, 3/4), since which she's experienced much more severe RUQ abdominal pain, exacerbated by, but not limited to, eating fatty, greasy, or fried foods. She adds that since eliminating such foods, she may experience 3 or 4 days at a time without or with less severe abdominal pain, but it has not completely resolved despite her having been taking routine Percocet as prescribed since her procedure. She says her narcotic pain medication "numbs" her pain, but it then recurs as the medication "wears off". She otherwise denies N/V, constipation, diarrhea, fever/chills, CP, or SOB.  Of note, patient recalls she previously underwent laparoscopic gastric bypass in [facility] (2003), after which she lost >170 lbs (>50% her former body weight). Though its been a while, she does not recall having been told she had gallstones at that time, but says she was one of her surgeon's first laparoscopic gastric bypass surgeries. 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 Lastly, she has also previously underwent abdominoplasty with umbilcal reconstruction, but no hernia repair or placement of mesh.  PAST MEDICAL HISTORY (PMH):  Past Medical History:  Diagnosis Date  . Anemia   . Anxiety   . Asthma   . Diabetes mellitus type II   . GERD (gastroesophageal reflux disease)   .  Hypertension   . Obesity   . Osteopenia   . Sleep apnea   . 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 Vitamin B12 deficiency    since bariatric surgery    PAST SURGICAL HISTORY (PSH):  Past Surgical History:  Procedure Laterality Date  . CYSTOSCOPY W/ URETERAL STENT PLACEMENT Left 11/11/2018   Procedure: CYSTOSCOPY WITH STENT REMOVAL;  Surgeon: Riki Altes, MD;  Location: [facility];  Service: Urology;  Laterality: Left;  . CYSTOSCOPY WITH HOLMIUM LASER LITHOTRIPSY Left 10/24/2018   Procedure: CYSTOSCOPY WITH HOLMIUM LASER LITHOTRIPSy;  Surgeon: Crista Elliot, MD;  Location: [facility];  Service: Urology;  Laterality: Left;  . CYSTOSCOPY WITH STENT PLACEMENT Bilateral 10/24/2018   Procedure: CYSTOSCOPY WITH STENT PLACEMENT;  Surgeon: Crista Elliot, MD;  Location: [facility];  Service: Urology;  Laterality: Bilateral;  . CYSTOSCOPY/URETEROSCOPY/HOLMIUM LASER/STENT PLACEMENT Right 11/11/2018   Procedure: CYSTOSCOPY/URETEROSCOPY/HOLMIUM LASER/STENT EXCHANGE;  Surgeon: Riki Altes, MD;  Location: [facility];  Service: Urology;  Laterality: Right;  . ESOPHAGOGASTRODUODENOSCOPY (EGD) WITH PROPOFOL N/A 06/01/2018   Procedure: ESOPHAGOGASTRODUODENOSCOPY (EGD) WITH PROPOFOL;  Surgeon: Toney Reil, MD;  Location: [facility];  Service: Gastroenterology;  Laterality: N/A;  . 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 REDUCTION MAMMAPLASTY Right 1992  . URETEROSCOPY Left 10/24/2018   Procedure: URETEROSCOPY;  Surgeon: Crista Elliot, MD;  Location: [facility];  Service: Urology;  Laterality: Left;    MEDICATIONS:  Prior to Admission medications   Medication Sig Start Date End Date Taking? Authorizing Provider  oxyCODONE-acetaminophen (PERCOCET) 5-325 MG tablet Take 1 tablet by mouth every 4 (four) hours as needed for severe  pain. 01/21/19  Yes Stoioff, Verna Czech, MD    ALLERGIES:  Allergies  Allergen Reactions  . Codeine Sulfate Other (See Comments)    Reaction: "violently ill"  . Penicillins Other (See Comments)     Did it involve swelling of the face/tongue/throat, SOB, or low BP? Unknown Did it involve sudden or severe rash/hives, skin peeling, or any reaction on the inside of your mouth or nose? Unknown Did you need to seek medical attention at a hospital or doctor's office? Unknown When did it last happen?Childhood allergy If all above answers are "NO", may proceed with cephalosporin use.   . Latex Itching and Rash    SOCIAL HISTORY:  Social History   Socioeconomic History  . Marital status: Married    Spouse name: Not on file  . Number of children: 1  . Years of education: Not on file  . Highest education level: Not on file  Occupational History  . Occupation: homemaker  Social Needs  . Financial resource strain: Not on file  . Food insecurity:    Worry: Not on file    Inability: Not on file  . Transportation needs:    Medical: Not on file    Non-medical: Not on file  Tobacco Use  . Smoking status: Current Every Day Smoker    Packs/day: 1.00  . Smokeless tobacco: Never Used  Substance and Sexual Activity  . Alcohol use: No  . Drug use: No  . Sexual activity: Yes    Birth control/protection: None  Lifestyle  . Physical activity:    Days per week: Not on file    Minutes per session: Not on file  . Stress: Not on file  Relationships  . Social connections:    Talks on phone: Not on file    Gets together: Not on file    Attends religious service: Not on file    Active member of club or organization: Not on file    Attends meetings of clubs or organizations: Not on file    Relationship status: Not on file  . Intimate partner violence:    Fear of current or ex partner: Not on file    Emotionally abused: Not on file    Physically abused: Not on file    Forced sexual activity: Not on file  Other Topics Concern  . Not on file  Social History Narrative   Sporadic with exercise    The patient currently resides (home / rehab facility / nursing home): Home The patient  normally is (ambulatory / bedbound): Ambulatory  FAMILY HISTORY:  Family History  Problem Relation Age of Onset  . Heart disease Mother        valve replacement surgery  . Coronary artery disease Father   . Breast cancer Neg Hx     Otherwise negative/non-contributory.  REVIEW OF SYSTEMS:  Constitutional: denies any other weight loss, fever, chills, or sweats  Eyes: denies any other vision changes, history of eye injury  ENT: denies sore throat, hearing problems  Respiratory: denies shortness of breath, wheezing  Cardiovascular: denies chest pain, palpitations  Gastrointestinal: abdominal pain, N/V, and bowel function as per HPI Musculoskeletal: denies any other joint pains or cramps  Skin: Denies any other rashes or skin discolorations except as per HPI Neurological: denies any other headache, dizziness, weakness  Psychiatric: Denies any other depression, anxiety   All other review of systems were otherwise negative   VITAL SIGNS:  BP (!) 181/119   Pulse 92  Temp (!) 97.5 F (36.4 C) (Temporal)   Resp 16   Ht 5' (1.524 m)   Wt 132 lb 3.2 oz (60 kg)   SpO2 97%   BMI 25.82 kg/m   PHYSICAL EXAM:  Constitutional:  -- Normal body habitus  -- Awake, alert, and oriented x3  Eyes:  -- Pupils equally round and reactive to light  -- No scleral icterus  Ear, nose, throat:  -- No jugular venous distension -- No nasal drainage, bleeding Pulmonary:  -- No crackles  -- Equal breath sounds bilaterally -- Breathing non-labored at rest Cardiovascular:  -- S1, S2 present  -- No pericardial rubs  Gastrointestinal:  -- Abdomen soft and non-distended with focal moderately severe RUQ tenderness to palpation, no guarding/rebound tenderness -- Post-surgical scars and umbilical reconstruction consistent with provided surgical history including umbilical reconstruction during abdominoplasty -- No abdominal masses appreciated, pulsatile or otherwise Musculoskeletal and Integumentary:   -- Wounds or skin discoloration: None appreciated except as described above (GI) -- Extremities: B/L UE and LE FROM, hands and feet warm, no edema  Neurologic:  -- Motor function: Intact and symmetric -- Sensation: Intact and symmetric  Labs:  CBC Latest Ref Rng & Units 01/07/2019 12/28/2018 11/30/2018  WBC 4.0 - 10.5 K/uL 8.5 7.3 7.5  Hemoglobin 12.0 - 15.0 g/dL 16.6(H) 17.2(H) 16.7(H)  Hematocrit 36.0 - 46.0 % 46.7(H) 47.9(H) 47.0(H)  Platelets 150 - 400 K/uL 342 265 271   CMP Latest Ref Rng & Units 01/07/2019 11/02/2018 10/23/2018  Glucose 70 - 99 mg/dL 130(Q125(H) 657(Q120(H) 469(G132(H)  BUN 6 - 20 mg/dL 15 20 29(B22(H)  Creatinine 0.44 - 1.00 mg/dL 2.840.52 1.320.75 4.400.83  Sodium 135 - 145 mmol/L 134(L) 141 142  Potassium 3.5 - 5.1 mmol/L 3.6 3.2(L) 3.3(L)  Chloride 98 - 111 mmol/L 101 102 110  CO2 22 - 32 mmol/L 23 26 25   Calcium 8.9 - 10.3 mg/dL 9.2 10.210.0 9.0  Total Protein 6.5 - 8.1 g/dL 7.2(Z8.4(H) 3.6(U8.4(H) 6.7  Total Bilirubin 0.3 - 1.2 mg/dL 0.3 0.4 0.4  Alkaline Phos 38 - 126 U/L 88 70 51  AST 15 - 41 U/L 13(L) 17 20  ALT 0 - 44 U/L 10 14 10    Imaging studies:  Limited RUQ Abdominal Ultrasound (01/22/2019) Cholelithiasis without inflammation. No other abnormality seen in the right upper quadrant of the abdomen.  CT Abdomen and Pelvis without contrast (01/04/2019) 1. In addition to multiple tiny 2-3 mm nonobstructive calculi in the collecting systems of both kidneys there is a 4 mm calculus at the right ureteropelvic junction. At this time, this is not associated with proximal right hydronephrosis to indicate obstruction. No other ureteral stones. 2. Cholelithiasis without evidence of acute cholecystitis at this time. 3. Colonic diverticulosis without evidence to suggest an acute diverticulitis at this time. 4. Aortic atherosclerosis, in addition to at least right coronary artery disease. Please note that although the presence of coronary artery calcium documents the presence of coronary artery  disease, the severity of this disease and any potential stenosis cannot be assessed on this non-gated CT examination. Assessment for potential risk factor modification, dietary therapy or pharmacologic therapy may be warranted, if clinically indicated. 5. There are calcifications of the mitral annulus.   Assessment/Plan: 53 y.o. female with severe symptomatic cholelithiasis +/- possible chronic cholecystitis, complicated by pertinent comorbidities including history of laparoscopic gastric bypass bariatric surgery, GERD with PUD, asthma, nephro-ureterolithiasis, vitamin B12 deficiency, osteopenia, generalized anxiety disorder, and former morbid obesity, DM, HTN, and OSA.               -  avoid/minimize foods with higher fat content (meats, cheeses/dairy, and fried)             - prefer low-fat vegetables, whole grains (wheat bread, ceareals, etc), and fruits until cholecystectomy             - if pain medication, prefer acetaminophen tramadol over morphine derivatives (oxycodone, dilaudid, etc), would also advise ibuprofen but patient previously advised to avoid due to reported history of gastric/pouch ulcer             - all risks, benefits, and alternatives to cholecystectomy were discussed with the patient, all of her questions were answered to patient's expressed satisfaction, patient expresses she wishes to proceed, and informed consent was obtained.             - will plan for laparoscopic cholecystectomy per patient request pending anesthesia and OR availability             - anticipate post-surgical follow-up 2 weeks after above surgery             - instructed to call if any questions or concerns  All of the above recommendations were discussed with the patient and patient's family, and all of patient's and family's questions were answered to their expressed satisfaction.  Thank you for the opportunity to participate in this patient's care.  -- Scherrie Gerlach Earlene Plater, MD, RPVI [place]:  [facility] General Surgery - Partnering for exceptional care. Office: [phone]

## 2019-02-05 ENCOUNTER — Encounter: Payer: Self-pay | Admitting: Surgery

## 2019-02-05 DIAGNOSIS — K8020 Calculus of gallbladder without cholecystitis without obstruction: Secondary | ICD-10-CM | POA: Insufficient documentation

## 2019-02-05 HISTORY — DX: Calculus of gallbladder without cholecystitis without obstruction: K80.20

## 2019-02-08 ENCOUNTER — Other Ambulatory Visit: Payer: Self-pay | Admitting: Urology

## 2019-02-08 ENCOUNTER — Telehealth: Payer: Self-pay | Admitting: *Deleted

## 2019-02-08 NOTE — Telephone Encounter (Signed)
Pt called and states that she needs a refill on her Percocet. Please advise.

## 2019-02-08 NOTE — Telephone Encounter (Signed)
She is scheduled for cholecystectomy with general surgery.  Recommend she contact her general surgery office for refill

## 2019-02-08 NOTE — Telephone Encounter (Signed)
I cannot prescribe narcotic pain medication for pain that is not urologically related.  Would have her reach out to her PCP.

## 2019-02-08 NOTE — Telephone Encounter (Signed)
Please advise on refill for Percocet

## 2019-02-08 NOTE — Telephone Encounter (Signed)
Patient states she has tried the Tylenol and it is not touching her pain. She is requesting something stronger.

## 2019-02-08 NOTE — Telephone Encounter (Signed)
Patient called and stated that she wants some pain medication.

## 2019-02-08 NOTE — Telephone Encounter (Signed)
Pt called back and I read her the message from Dr Lonna Cobb, she states that general surgery dept told her that they could not prescribe pain meds until after the surgery. Please advise.

## 2019-02-09 MED ORDER — TRAMADOL HCL 50 MG PO TABS: 50.0000 mg | ORAL_TABLET | Freq: Four times a day (QID) | ORAL | 0 refills | Status: DC | PRN

## 2019-02-09 NOTE — Telephone Encounter (Signed)
-----   Message from Ancil Linsey, MD sent at 02/09/2019  3:47 PM EDT ----- Regarding: Tramadol Hi Velna Hatchet and Irving Burton, et al,  As mentioned in my recent office note, I recommend Tramadol (50 mg q 6 hrs prn severe pain) if her pain is not relieved with dietary modification and/or Tylenol. When I saw the patient, however, she was going to ask her urologist to change the prescription he'd given her for oxycodone to Tramadol. If that did not occur, I am okay with providing a prescription for 15 tablets to help her until surgery.  Please let me know if any questions or concerns.  Thank you.        Barbara Cower  -- Scherrie Gerlach. Earlene Plater, MD, RPVI [place]: [facility] General Surgery - Partnering for exceptional care. Office: [phone]

## 2019-02-09 NOTE — Telephone Encounter (Signed)
Patient notified and will call PCP

## 2019-02-09 NOTE — Telephone Encounter (Signed)
Patient called again and said she was checking on the medication for her pain. Please contact patient soon as you hear from Dr. Earlene Plater.

## 2019-02-09 NOTE — Telephone Encounter (Signed)
RX called in to [facility] [employer], pt aware.

## 2019-02-10 ENCOUNTER — Other Ambulatory Visit: Payer: Self-pay

## 2019-02-10 ENCOUNTER — Encounter
Admission: RE | Admit: 2019-02-10 | Discharge: 2019-02-10 | Disposition: A | Discharge status: Home / Self Care | Payer: 59 | Source: Ambulatory Visit | Attending: Surgery | Admitting: Surgery

## 2019-02-10 DIAGNOSIS — Z1159 Encounter for screening for other viral diseases: Secondary | ICD-10-CM | POA: Diagnosis not present

## 2019-02-10 DIAGNOSIS — Z01812 Encounter for preprocedural laboratory examination: Secondary | ICD-10-CM | POA: Diagnosis not present

## 2019-02-10 NOTE — Patient Instructions (Signed)
Your procedure is scheduled on: February 17, 2019 Wednesday Report to Day Surgery on the 2nd floor of [facility]. To find out your arrival time, please call [phone] between 1PM - 3PM on: Tuesday February 16, 2019  REMEMBER: Instructions that are not followed completely may result in serious medical risk, up to and including death; or upon the discretion of your surgeon and anesthesiologist your surgery may need to be rescheduled.  Do not eat food after midnight the night before surgery.  No gum chewing, lozengers or hard candies.  You may however, drink CLEAR liquids up to 2 hours before you are scheduled to arrive for your surgery. Do not drink anything within 2 hours of the start of your surgery.  Clear liquids include: - water  - apple juice without pulp -clear gatorade - black coffee or tea (Do NOT add milk or creamers to the coffee or tea) Do NOT drink anything that is not on this list.  Type 1 and Type 2 diabetics should only drink water.      No Alcohol for 24 hours before or after surgery.  No Smoking including e-cigarettes for 24 hours prior to surgery.  No chewable tobacco products for at least 6 hours prior to surgery.  No nicotine patches on the day of surgery.  On the morning of surgery brush your teeth with toothpaste and water, you may rinse your mouth with mouthwash if you wish. Do not swallow any toothpaste or mouthwash.  Notify your doctor if there is any change in your medical condition (cold, fever, infection).  Do not wear jewelry, make-up, hairpins, clips or nail polish.   Do not wear lotions, powders, or perfumes and deodorant   Do not shave 48 hours prior to surgery.   Contacts and dentures may not be worn into surgery.  Do not bring valuables to the hospital, including drivers license, insurance or credit cards.  [facility] is not responsible for any belongings or valuables.   TAKE THESE MEDICATIONS THE MORNING OF SURGERY: tramadol   Use  CHG Soap  as directed on instruction sheet.   Stop Anti-inflammatories (NSAIDS) such as Advil, Aleve, Ibuprofen, Motrin, Naproxen, Naprosyn and Aspirin based products such as Excedrin, Goodys Powder, BC Powder. (May take Tylenol or Acetaminophen if needed.)  Stop ANY OVER THE COUNTER supplements until after surgery. (May continue Vitamin D, Vitamin B, and multivitamin.)  Wear comfortable clothing (specific to your surgery type) to the hospital.  Plan for stool softeners for home use.  If you are being discharged the day of surgery, you will not be allowed to drive home. You will need a responsible adult to drive you home and stay with you that night.   If you are taking public transportation, you will need to have a responsible adult with you. Please confirm with your physician that it is acceptable to use public transportation.   Please call [phone] if you have any questions about these instructions.

## 2019-02-10 NOTE — Pre-Procedure Instructions (Signed)
Preop instructions reviewed pt voiced understanding. A copy of instructions and chg soap to be picked up day of covid testing 02-12-2019

## 2019-02-12 ENCOUNTER — Other Ambulatory Visit
Admission: RE | Admit: 2019-02-12 | Discharge: 2019-02-12 | Disposition: A | Discharge status: Home / Self Care | Payer: 59 | Source: Ambulatory Visit | Attending: Surgery | Admitting: Surgery

## 2019-02-12 ENCOUNTER — Other Ambulatory Visit: Payer: Self-pay

## 2019-02-12 DIAGNOSIS — Z01812 Encounter for preprocedural laboratory examination: Secondary | ICD-10-CM | POA: Diagnosis not present

## 2019-02-13 LAB — NOVEL CORONAVIRUS, NAA (HOSP ORDER, SEND-OUT TO REF LAB; TAT 18-24 HRS): SARS-CoV-2, NAA: NOT DETECTED

## 2019-02-16 MED ORDER — CEFAZOLIN SODIUM-DEXTROSE 2-4 GM/100ML-% IV SOLN
2.0000 g | INTRAVENOUS | Status: AC
  Administered 2019-02-17: 2 g via INTRAVENOUS

## 2019-02-17 ENCOUNTER — Encounter
Admission: RE | Disposition: A | Discharge status: Home / Self Care | Payer: Self-pay | Source: Home / Self Care | Attending: Surgery

## 2019-02-17 ENCOUNTER — Ambulatory Visit: Payer: 59 | Admitting: Anesthesiology

## 2019-02-17 ENCOUNTER — Other Ambulatory Visit: Payer: Self-pay

## 2019-02-17 ENCOUNTER — Ambulatory Visit
Admission: RE | Admit: 2019-02-17 | Discharge: 2019-02-17 | Disposition: A | Discharge status: Home / Self Care | Payer: 59 | Attending: Surgery | Admitting: Surgery

## 2019-02-17 DIAGNOSIS — E538 Deficiency of other specified B group vitamins: Secondary | ICD-10-CM | POA: Insufficient documentation

## 2019-02-17 DIAGNOSIS — G4730 Sleep apnea, unspecified: Secondary | ICD-10-CM | POA: Insufficient documentation

## 2019-02-17 DIAGNOSIS — J45909 Unspecified asthma, uncomplicated: Secondary | ICD-10-CM | POA: Diagnosis not present

## 2019-02-17 DIAGNOSIS — Z880 Allergy status to penicillin: Secondary | ICD-10-CM | POA: Diagnosis not present

## 2019-02-17 DIAGNOSIS — K8020 Calculus of gallbladder without cholecystitis without obstruction: Secondary | ICD-10-CM | POA: Diagnosis present

## 2019-02-17 DIAGNOSIS — Z885 Allergy status to narcotic agent status: Secondary | ICD-10-CM | POA: Insufficient documentation

## 2019-02-17 DIAGNOSIS — F17200 Nicotine dependence, unspecified, uncomplicated: Secondary | ICD-10-CM | POA: Diagnosis not present

## 2019-02-17 DIAGNOSIS — I10 Essential (primary) hypertension: Secondary | ICD-10-CM | POA: Insufficient documentation

## 2019-02-17 DIAGNOSIS — M8580 Other specified disorders of bone density and structure, unspecified site: Secondary | ICD-10-CM | POA: Diagnosis not present

## 2019-02-17 DIAGNOSIS — D649 Anemia, unspecified: Secondary | ICD-10-CM | POA: Insufficient documentation

## 2019-02-17 DIAGNOSIS — Z8711 Personal history of peptic ulcer disease: Secondary | ICD-10-CM | POA: Diagnosis not present

## 2019-02-17 DIAGNOSIS — Z8249 Family history of ischemic heart disease and other diseases of the circulatory system: Secondary | ICD-10-CM | POA: Diagnosis not present

## 2019-02-17 DIAGNOSIS — E669 Obesity, unspecified: Secondary | ICD-10-CM | POA: Insufficient documentation

## 2019-02-17 DIAGNOSIS — F419 Anxiety disorder, unspecified: Secondary | ICD-10-CM | POA: Diagnosis not present

## 2019-02-17 DIAGNOSIS — Z79899 Other long term (current) drug therapy: Secondary | ICD-10-CM | POA: Insufficient documentation

## 2019-02-17 DIAGNOSIS — K219 Gastro-esophageal reflux disease without esophagitis: Secondary | ICD-10-CM | POA: Diagnosis not present

## 2019-02-17 DIAGNOSIS — E119 Type 2 diabetes mellitus without complications: Secondary | ICD-10-CM | POA: Insufficient documentation

## 2019-02-17 DIAGNOSIS — Z91040 Latex allergy status: Secondary | ICD-10-CM | POA: Diagnosis not present

## 2019-02-17 DIAGNOSIS — Z9884 Bariatric surgery status: Secondary | ICD-10-CM | POA: Diagnosis not present

## 2019-02-17 DIAGNOSIS — Z87442 Personal history of urinary calculi: Secondary | ICD-10-CM | POA: Diagnosis not present

## 2019-02-17 DIAGNOSIS — K8010 Calculus of gallbladder with chronic cholecystitis without obstruction: Secondary | ICD-10-CM | POA: Insufficient documentation

## 2019-02-17 DIAGNOSIS — K828 Other specified diseases of gallbladder: Secondary | ICD-10-CM | POA: Diagnosis not present

## 2019-02-17 HISTORY — PX: GALLBLADDER SURGERY: SHX652

## 2019-02-17 HISTORY — PX: CHOLECYSTECTOMY: SHX55

## 2019-02-17 LAB — GLUCOSE, CAPILLARY
Glucose-Capillary: 124 mg/dL — ABNORMAL HIGH (ref 70–99)
Glucose-Capillary: 134 mg/dL — ABNORMAL HIGH (ref 70–99)

## 2019-02-17 SURGERY — LAPAROSCOPIC CHOLECYSTECTOMY
Anesthesia: General

## 2019-02-17 MED ORDER — ACETAMINOPHEN 10 MG/ML IV SOLN
INTRAVENOUS | Status: AC
  Filled 2019-02-17: qty 100

## 2019-02-17 MED ORDER — GABAPENTIN 300 MG PO CAPS
300.0000 mg | ORAL_CAPSULE | ORAL | Status: AC
  Administered 2019-02-17: 300 mg via ORAL

## 2019-02-17 MED ORDER — OXYCODONE-ACETAMINOPHEN 5-325 MG PO TABS
1.0000 | ORAL_TABLET | Freq: Once | ORAL | Status: AC
  Administered 2019-02-17: 1 via ORAL

## 2019-02-17 MED ORDER — EPHEDRINE SULFATE 50 MG/ML IJ SOLN
INTRAMUSCULAR | Status: DC | PRN
  Administered 2019-02-17: 10 mg via INTRAVENOUS

## 2019-02-17 MED ORDER — DEXMEDETOMIDINE HCL 200 MCG/2ML IV SOLN
INTRAVENOUS | Status: DC | PRN
  Administered 2019-02-17: 10 ug via INTRAVENOUS

## 2019-02-17 MED ORDER — PROMETHAZINE HCL 25 MG/ML IJ SOLN
INTRAMUSCULAR | Status: AC
  Filled 2019-02-17: qty 1

## 2019-02-17 MED ORDER — ONDANSETRON HCL 4 MG/2ML IJ SOLN
INTRAMUSCULAR | Status: DC | PRN
  Administered 2019-02-17: 4 mg via INTRAVENOUS

## 2019-02-17 MED ORDER — FENTANYL CITRATE (PF) 100 MCG/2ML IJ SOLN
INTRAMUSCULAR | Status: AC
  Filled 2019-02-17: qty 2

## 2019-02-17 MED ORDER — OXYCODONE HCL 5 MG PO TABS: 5.0000 mg | ORAL_TABLET | Freq: Once | ORAL | Status: DC | PRN

## 2019-02-17 MED ORDER — MIDAZOLAM HCL 2 MG/2ML IJ SOLN
INTRAMUSCULAR | Status: AC
  Filled 2019-02-17: qty 2

## 2019-02-17 MED ORDER — SCOPOLAMINE 1 MG/3DAYS TD PT72
MEDICATED_PATCH | TRANSDERMAL | Status: AC
  Administered 2019-02-17: 1.5 mg via TRANSDERMAL
  Filled 2019-02-17: qty 1

## 2019-02-17 MED ORDER — SUGAMMADEX SODIUM 500 MG/5ML IV SOLN
INTRAVENOUS | Status: DC | PRN
  Administered 2019-02-17: 200 mg via INTRAVENOUS

## 2019-02-17 MED ORDER — SCOPOLAMINE 1 MG/3DAYS TD PT72
1.0000 | MEDICATED_PATCH | TRANSDERMAL | Status: DC
  Administered 2019-02-17: 1.5 mg via TRANSDERMAL

## 2019-02-17 MED ORDER — PROPOFOL 10 MG/ML IV BOLUS
INTRAVENOUS | Status: DC | PRN
  Administered 2019-02-17: 150 mg via INTRAVENOUS

## 2019-02-17 MED ORDER — CHLORHEXIDINE GLUCONATE CLOTH 2 % EX PADS: 6.0000 | MEDICATED_PAD | Freq: Once | CUTANEOUS | Status: DC

## 2019-02-17 MED ORDER — ACETAMINOPHEN 500 MG PO TABS: 1000.0000 mg | ORAL_TABLET | ORAL | Status: DC

## 2019-02-17 MED ORDER — PROMETHAZINE HCL 25 MG/ML IJ SOLN
6.2500 mg | Freq: Once | INTRAMUSCULAR | Status: AC
  Administered 2019-02-17: 10:00:00 6.25 mg via INTRAVENOUS

## 2019-02-17 MED ORDER — SODIUM CHLORIDE FLUSH 0.9 % IV SOLN
INTRAVENOUS | Status: AC
  Filled 2019-02-17: qty 10

## 2019-02-17 MED ORDER — LACTATED RINGERS IV SOLN
INTRAVENOUS | Status: DC
  Administered 2019-02-17 (×2): via INTRAVENOUS

## 2019-02-17 MED ORDER — OXYCODONE HCL 5 MG/5ML PO SOLN: 5.0000 mg | Freq: Once | ORAL | Status: DC | PRN

## 2019-02-17 MED ORDER — OXYCODONE-ACETAMINOPHEN 5-325 MG PO TABS: 1.0000 | ORAL_TABLET | ORAL | 0 refills | Status: DC | PRN

## 2019-02-17 MED ORDER — LIDOCAINE HCL 1 % IJ SOLN
INTRAMUSCULAR | Status: DC | PRN
  Administered 2019-02-17: 20 mL via INTRAMUSCULAR

## 2019-02-17 MED ORDER — ROCURONIUM BROMIDE 100 MG/10ML IV SOLN
INTRAVENOUS | Status: DC | PRN
  Administered 2019-02-17: 35 mg via INTRAVENOUS
  Administered 2019-02-17: 5 mg via INTRAVENOUS

## 2019-02-17 MED ORDER — DEXAMETHASONE SODIUM PHOSPHATE 10 MG/ML IJ SOLN
INTRAMUSCULAR | Status: DC | PRN
  Administered 2019-02-17: 10 mg via INTRAVENOUS

## 2019-02-17 MED ORDER — PROPOFOL 10 MG/ML IV BOLUS
INTRAVENOUS | Status: AC
  Filled 2019-02-17: qty 20

## 2019-02-17 MED ORDER — CEFAZOLIN SODIUM-DEXTROSE 2-4 GM/100ML-% IV SOLN
INTRAVENOUS | Status: AC
  Filled 2019-02-17: qty 100

## 2019-02-17 MED ORDER — KETOROLAC TROMETHAMINE 15 MG/ML IJ SOLN
15.0000 mg | Freq: Once | INTRAMUSCULAR | Status: AC
  Administered 2019-02-17: 15 mg via INTRAVENOUS

## 2019-02-17 MED ORDER — SUCCINYLCHOLINE CHLORIDE 20 MG/ML IJ SOLN
INTRAMUSCULAR | Status: DC | PRN
  Administered 2019-02-17: 100 mg via INTRAVENOUS

## 2019-02-17 MED ORDER — GABAPENTIN 300 MG PO CAPS
ORAL_CAPSULE | ORAL | Status: AC
  Administered 2019-02-17: 300 mg via ORAL
  Filled 2019-02-17: qty 1

## 2019-02-17 MED ORDER — ROCURONIUM BROMIDE 50 MG/5ML IV SOLN
INTRAVENOUS | Status: AC
  Filled 2019-02-17: qty 1

## 2019-02-17 MED ORDER — FENTANYL CITRATE (PF) 100 MCG/2ML IJ SOLN
25.0000 ug | INTRAMUSCULAR | Status: DC | PRN
  Administered 2019-02-17 (×3): 50 ug via INTRAVENOUS

## 2019-02-17 MED ORDER — PHENYLEPHRINE HCL (PRESSORS) 10 MG/ML IV SOLN
INTRAVENOUS | Status: DC | PRN
  Administered 2019-02-17: 100 ug via INTRAVENOUS

## 2019-02-17 MED ORDER — BUPIVACAINE HCL (PF) 0.5 % IJ SOLN
INTRAMUSCULAR | Status: AC
  Filled 2019-02-17: qty 30

## 2019-02-17 MED ORDER — DEXMEDETOMIDINE HCL IN NACL 200 MCG/50ML IV SOLN
INTRAVENOUS | Status: AC
  Filled 2019-02-17: qty 50

## 2019-02-17 MED ORDER — LIDOCAINE HCL (PF) 2 % IJ SOLN
INTRAMUSCULAR | Status: AC
  Filled 2019-02-17: qty 10

## 2019-02-17 MED ORDER — KETOROLAC TROMETHAMINE 15 MG/ML IJ SOLN
INTRAMUSCULAR | Status: AC
  Filled 2019-02-17: qty 1

## 2019-02-17 MED ORDER — OXYCODONE-ACETAMINOPHEN 5-325 MG PO TABS
ORAL_TABLET | ORAL | Status: AC
  Administered 2019-02-17: 1 via ORAL
  Filled 2019-02-17: qty 1

## 2019-02-17 MED ORDER — LIDOCAINE HCL (PF) 1 % IJ SOLN
INTRAMUSCULAR | Status: AC
  Filled 2019-02-17: qty 30

## 2019-02-17 MED ORDER — MIDAZOLAM HCL 2 MG/2ML IJ SOLN
INTRAMUSCULAR | Status: DC | PRN
  Administered 2019-02-17: 2 mg via INTRAVENOUS

## 2019-02-17 MED ORDER — LIDOCAINE HCL (CARDIAC) PF 100 MG/5ML IV SOSY
PREFILLED_SYRINGE | INTRAVENOUS | Status: DC | PRN
  Administered 2019-02-17: 60 mg via INTRAVENOUS

## 2019-02-17 MED ORDER — FENTANYL CITRATE (PF) 100 MCG/2ML IJ SOLN
INTRAMUSCULAR | Status: DC | PRN
  Administered 2019-02-17 (×2): 50 ug via INTRAVENOUS

## 2019-02-17 SURGICAL SUPPLY — 41 items
ADH SKN CLS APL DERMABOND .7 (GAUZE/BANDAGES/DRESSINGS) ×1
APL PRP STRL LF DISP 70% ISPRP (MISCELLANEOUS) ×1
APPLIER CLIP ROT 10 11.4 M/L (STAPLE) ×3
APR CLP MED LRG 11.4X10 (STAPLE) ×1
BAG SPEC RTRVL LRG 6X4 10 (ENDOMECHANICALS) ×1
CHLORAPREP W/TINT 26 (MISCELLANEOUS) ×3 IMPLANT
CLIP APPLIE ROT 10 11.4 M/L (STAPLE) ×1 IMPLANT
COVER WAND RF STERILE (DRAPES) ×1 IMPLANT
DECANTER SPIKE VIAL GLASS SM (MISCELLANEOUS) ×2 IMPLANT
DERMABOND ADVANCED (GAUZE/BANDAGES/DRESSINGS) ×2
DERMABOND ADVANCED .7 DNX12 (GAUZE/BANDAGES/DRESSINGS) ×1 IMPLANT
DRESSING SURGICEL FIBRLLR 1X2 (HEMOSTASIS) IMPLANT
DRSG SURGICEL FIBRILLAR 1X2 (HEMOSTASIS)
ELECT REM PT RETURN 9FT ADLT (ELECTROSURGICAL) ×3
ELECTRODE REM PT RTRN 9FT ADLT (ELECTROSURGICAL) ×1 IMPLANT
GLOVE BIO SURGEON STRL SZ7 (GLOVE) ×3 IMPLANT
GLOVE BIOGEL PI IND STRL 7.5 (GLOVE) ×1 IMPLANT
GLOVE BIOGEL PI INDICATOR 7.5 (GLOVE) ×4
GOWN STRL REUS W/ TWL LRG LVL3 (GOWN DISPOSABLE) ×3 IMPLANT
GOWN STRL REUS W/TWL LRG LVL3 (GOWN DISPOSABLE) ×9
GRASPER SUT TROCAR 14GX15 (MISCELLANEOUS) ×3 IMPLANT
IRRIGATION STRYKERFLOW (MISCELLANEOUS) IMPLANT
IRRIGATOR STRYKERFLOW (MISCELLANEOUS)
IV NS 1000ML (IV SOLUTION)
IV NS 1000ML BAXH (IV SOLUTION) IMPLANT
KIT TURNOVER KIT A (KITS) ×3 IMPLANT
LABEL OR SOLS (LABEL) ×3 IMPLANT
NDL INSUFFLATION 14GA 120MM (NEEDLE) ×1 IMPLANT
NEEDLE HYPO 22GX1.5 SAFETY (NEEDLE) ×3 IMPLANT
NEEDLE INSUFFLATION 14GA 120MM (NEEDLE) ×3 IMPLANT
NS IRRIG 1000ML POUR BTL (IV SOLUTION) ×3 IMPLANT
PACK LAP CHOLECYSTECTOMY (MISCELLANEOUS) ×3 IMPLANT
POUCH SPECIMEN RETRIEVAL 10MM (ENDOMECHANICALS) ×3 IMPLANT
SCISSORS METZENBAUM CVD 33 (INSTRUMENTS) ×3 IMPLANT
SET TUBE SMOKE EVAC HIGH FLOW (TUBING) ×3 IMPLANT
SLEEVE ENDOPATH XCEL 5M (ENDOMECHANICALS) ×6 IMPLANT
SUT MNCRL AB 4-0 PS2 18 (SUTURE) ×3 IMPLANT
SUT VICRYL 0 UR6 27IN ABS (SUTURE) ×3 IMPLANT
SUT VICRYL AB 3-0 FS1 BRD 27IN (SUTURE) ×3 IMPLANT
TROCAR XCEL 12X100 BLDLESS (ENDOMECHANICALS) ×3 IMPLANT
TROCAR XCEL NON-BLD 5MMX100MML (ENDOMECHANICALS) ×3 IMPLANT

## 2019-02-17 NOTE — Transfer of Care (Signed)
Immediate Anesthesia Transfer of Care Note  Patient: Brenda Hawkins  Procedure(s) Performed: LAPAROSCOPIC CHOLECYSTECTOMY - DIABETIC (N/A )  Patient Location: PACU  Anesthesia Type:General  Level of Consciousness: awake and alert   Airway & Oxygen Therapy: Patient Spontanous Breathing and Patient connected to face mask oxygen  Post-op Assessment: Report given to RN and Post -op Vital signs reviewed and stable  Post vital signs: Reviewed  Last Vitals:  Vitals Value Taken Time  BP 140/66 02/17/2019  8:58 AM  Temp 37.1 C 02/17/2019  8:58 AM  Pulse 89 02/17/2019  9:05 AM  Resp 28 02/17/2019  9:05 AM  SpO2 100 % 02/17/2019  9:05 AM  Vitals shown include unvalidated device data.  Last Pain:  Vitals:   02/17/19 0858  TempSrc:   PainSc: Asleep         Complications: No apparent anesthesia complications

## 2019-02-17 NOTE — Anesthesia Preprocedure Evaluation (Signed)
Anesthesia Evaluation  Patient identified by MRN, date of birth, ID band Patient awake    Reviewed: Allergy & Precautions, H&P , NPO status , Patient's Chart, lab work & pertinent test results  History of Anesthesia Complications Negative for: history of anesthetic complications  Airway Mallampati: II  TM Distance: >3 FB Neck ROM: full    Dental  (+) Chipped   Pulmonary asthma , sleep apnea , Current Smoker,           Cardiovascular Exercise Tolerance: Good hypertension, (-) angina(-) Past MI and (-) DOE      Neuro/Psych PSYCHIATRIC DISORDERS negative neurological ROS  negative psych ROS   GI/Hepatic Neg liver ROS, GERD  Medicated and Controlled,  Endo/Other  diabetes, Type 2  Renal/GU Renal disease     Musculoskeletal   Abdominal   Peds  Hematology negative hematology ROS (+)   Anesthesia Other Findings Past Medical History: No date: Anemia     Comment:  in past No date: Anxiety     Comment:  no longer No date: Asthma     Comment:  seasonal No date: Diabetes mellitus type II     Comment:  no longer No date: GERD (gastroesophageal reflux disease)     Comment:  no longer No date: Obesity No date: Osteopenia No date: Sleep apnea     Comment:  no longer 9/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 No date: Vitamin B12 deficiency     Comment:  since bariatric surgery  Past Surgical History: 11/11/2018: CYSTOSCOPY W/ URETERAL STENT PLACEMENT; Left     Comment:  Procedure: CYSTOSCOPY WITH STENT REMOVAL;  Surgeon:               Riki AltesStoioff, Scott C, MD;  Location: [facility] ORS;  Service:               Urology;  Laterality: Left; 10/24/2018: CYSTOSCOPY WITH HOLMIUM LASER LITHOTRIPSY; Left     Comment:  Procedure: CYSTOSCOPY WITH HOLMIUM LASER LITHOTRIPSy;                Surgeon: Crista ElliotBell, Eugene D III, MD;  Location: [facility] ORS;                Service: Urology;  Laterality: Left; 10/24/2018: CYSTOSCOPY WITH STENT PLACEMENT; Bilateral     Comment:   Procedure: CYSTOSCOPY WITH STENT PLACEMENT;  Surgeon:               Crista ElliotBell, Eugene D III, MD;  Location: [facility] ORS;  Service:               Urology;  Laterality: Bilateral; 11/11/2018: CYSTOSCOPY/URETEROSCOPY/HOLMIUM LASER/STENT PLACEMENT; Right     Comment:  Procedure: CYSTOSCOPY/URETEROSCOPY/HOLMIUM LASER/STENT               EXCHANGE;  Surgeon: Riki AltesStoioff, Scott C, MD;  Location: [facility]              ORS;  Service: Urology;  Laterality: Right; 06/01/2018: ESOPHAGOGASTRODUODENOSCOPY (EGD) WITH PROPOFOL; N/A     Comment:  Procedure: ESOPHAGOGASTRODUODENOSCOPY (EGD) WITH               PROPOFOL;  Surgeon: Toney ReilVanga, Rohini Reddy, MD;  Location:               [facility] ENDOSCOPY;  Service: Gastroenterology;  Laterality:               N/A; 03/17/02: GASTRIC BYPASS 1992: REDUCTION MAMMAPLASTY; Right 10/24/2018: URETEROSCOPY; Left     Comment:  Procedure: URETEROSCOPY;  Surgeon: Modena SlaterBell, Eugene  D III,               MD;  Location: [facility] ORS;  Service: Urology;  Laterality:               Left;     Reproductive/Obstetrics negative OB ROS                             Anesthesia Physical Anesthesia Plan  ASA: III  Anesthesia Plan: General ETT   Post-op Pain Management:    Induction: Intravenous  PONV Risk Score and Plan: Ondansetron, Dexamethasone, Midazolam and Treatment may vary due to age or medical condition  Airway Management Planned: Oral ETT  Additional Equipment:   Intra-op Plan:   Post-operative Plan: Extubation in OR  Informed Consent: I have reviewed the patients History and Physical, chart, labs and discussed the procedure including the risks, benefits and alternatives for the proposed anesthesia with the patient or authorized representative who has indicated his/her understanding and acceptance.     Dental Advisory Given  Plan Discussed with: Anesthesiologist, CRNA and Surgeon  Anesthesia Plan Comments: (Patient consented for risks of anesthesia including but not  limited to:  - adverse reactions to medications - damage to teeth, lips or other oral mucosa - sore throat or hoarseness - Damage to heart, brain, lungs or loss of life  Patient voiced understanding.)        Anesthesia Quick Evaluation

## 2019-02-17 NOTE — Op Note (Signed)
SURGICAL OPERATIVE REPORT   DATE OF PROCEDURE: 02/17/2019  ATTENDING Surgeon(s): Ancil Linseyavis, Tristyn Pharris Evan, MD  ANESTHESIA: GETA  PRE-OPERATIVE DIAGNOSIS: Symptomatic Cholelithiasis (K80.20)  POST-OPERATIVE DIAGNOSIS: Symptomatic Cholelithiasis (K80.20)  PROCEDURE(S): (cpt's: 47562) 1.) Laparoscopic Cholecystectomy  INTRAOPERATIVE FINDINGS: Minimal pericholecystic inflammation with cystic duct and cystic artery clips well-secured, hemostasis at completion of procedure  INTRAOPERATIVE FLUIDS: 800 mL crystalloid   ESTIMATED BLOOD LOSS: Minimal (< 20 mL)   URINE OUTPUT: No foley  SPECIMENS: Gallbladder  IMPLANTS: None  DRAINS: None   COMPLICATIONS: None apparent   CONDITION AT COMPLETION: Hemodynamically stable and extubated  DISPOSITION: PACU   INDICATION(S) FOR PROCEDURE:  Patient is a 53 y.o. female s/p laparoscopic gastric bypass and abdominoplasty who recently presented with post-prandial RUQ > epigastric abdominal pain after eating fatty foods in particular. Ultrasound suggested cholelithiasis without sonographic evidence of cholecystitis. All risks, benefits, and alternatives to above elective procedures were discussed with the patient, who elected to proceed, and informed consent was accordingly obtained at that time.   DETAILS OF PROCEDURE:  Patient was brought to the operating suite and appropriately identified. General anesthesia was administered along with peri-operative prophylactic IV antibiotics, and endotracheal intubation was performed by anesthesiologist. In supine position, operative site was prepped and draped in usual sterile fashion, and following a brief time out, initial 5 mm incision was made in a natural skin crease just above patient's umbilicus. Fascia was then elevated, and a Verress needle was inserted and its proper position confirmed using aspiration and saline meniscus test.  Upon insufflation of the abdominal cavity with carbon dioxide to a  well-tolerated pressure of 12-15 mmHg, 5 mm peri-umbilical port followed by laparoscope were inserted though a clearly identified peri-umbilical omental adhesion and used to inspect the abdominal cavity and its contents with no injuries from insertion of the first trochar noted otherwise. Three additional trocars were inserted, one at the epigastric position (10 mm) and two along the Right costal margin (5 mm). The table was then placed in reverse Trendelenburg position with the Right side up. Filmy adhesions between the gallbladder and omentum/duodenum/transverse colon were lysed using combined blunt dissection and selective electrocautery. The apex/dome of the gallbladder was grasped with an atraumatic grasper passed through the lateral port and retracted apically over the liver. The infundibulum was also grasped and retracted, exposing Calot's triangle. The peritoneum overlying the gallbladder infundibulum was incised and dissected free of surrounding peritoneal attachments, revealing the cystic duct and cystic artery, which were clipped twice on the patient side and once on the gallbladder specimen side close to the gallbladder. The gallbladder was then dissected from its peritoneal attachments to the liver using electrocautery, and the gallbladder was placed into a laparoscopic specimen bag and removed from the abdominal cavity via the epigastric port site. Hemostasis and secure placement of clips were confirmed, and intra-peritoneal cavity was inspected with no additional findings. PMI laparoscopic fascial closure device was then used to re-approximate fascia at the 10 mm epigastric port site.  All ports were then removed under direct visualization, and abdominal cavity was desuflated. All port sites were irrigated/cleaned, additional local anesthetic was injected at each incision, 3-0 Vicryl was used to re-approximate dermis at 10 mm port site(s), and subcuticular 4-0 Monocryl suture was used to  re-approximate skin. Skin was then cleaned, dried, and sterile skin glue was applied. Patient was then safely able to be awakened, extubated, and transferred to PACU for post-operative monitoring and care.   I was present for all aspects of the  above procedure, and no operative complications were apparent.

## 2019-02-17 NOTE — Anesthesia Procedure Notes (Signed)
Procedure Name: Intubation Date/Time: 02/17/2019 7:45 AM Performed by: Allean Found, CRNA Pre-anesthesia Checklist: Patient identified, Patient being monitored, Timeout performed, Emergency Drugs available and Suction available Patient Re-evaluated:Patient Re-evaluated prior to induction Oxygen Delivery Method: Circle system utilized Preoxygenation: Pre-oxygenation with 100% oxygen Induction Type: IV induction Ventilation: Mask ventilation without difficulty Laryngoscope Size: 3 and McGraph Grade View: Grade I Tube type: Oral Tube size: 7.0 mm Number of attempts: 1 Airway Equipment and Method: Stylet Placement Confirmation: ETT inserted through vocal cords under direct vision,  positive ETCO2 and breath sounds checked- equal and bilateral Secured at: 21 cm Tube secured with: Tape Dental Injury: Teeth and Oropharynx as per pre-operative assessment

## 2019-02-17 NOTE — Anesthesia Post-op Follow-up Note (Signed)
Anesthesia QCDR form completed.        

## 2019-02-17 NOTE — OR Nursing (Signed)
Spoke with patient's husband and gave him an updated timeline for today.

## 2019-02-17 NOTE — Discharge Instructions (Signed)
In addition to included general post-operative instructions for Laparoscopic Cholecystectomy,  Diet: Resume home heart healthy diet.   Activity: No heavy lifting >20 pounds (children, pets, laundry, garbage) or strenuous activity until follow-up in 2 weeks, but light activity and walking are encouraged. Do not drive or drink alcohol if taking narcotic pain medications.  Wound care: 2 days after surgery (Friday, 6/12), you may shower/get incision wet with soapy water and pat dry (do not rub incisions), but no baths or submerging incision underwater until follow-up.   Medications: Resume all home medications. For mild to moderate pain: acetaminophen (Tylenol). Combining Tylenol with alcohol can substantially increase your risk of causing liver disease. Narcotic pain medications, if prescribed, can be used for severe pain, though may cause nausea, constipation, and drowsiness. Do not combine Tylenol and Percocet (or similar) within a 6 hour period as Percocet (and similar) contain(s) Tylenol. If you do not need the narcotic pain medication, you do not need to fill the prescription.  Call office [phone]) at any time if any questions, worsening pain, fevers/chills, bleeding, drainage from incision site, or other concerns.

## 2019-02-17 NOTE — Interval H&P Note (Signed)
History and Physical Interval Note:  02/17/2019 7:21 AM  Brenda Hawkins  has presented today for surgery, with the diagnosis of CHOLELITHIASIS.  The various methods of treatment have been discussed with the patient and family. After consideration of risks, benefits and other options for treatment, the patient has consented to  Procedure(s): LAPAROSCOPIC CHOLECYSTECTOMY - DIABETIC (N/A) as a surgical intervention.  The patient's history has been reviewed, patient examined, no change in status, stable for surgery.  I have reviewed the patient's chart and labs.  Questions were answered to the patient's satisfaction.     Vickie Epley

## 2019-02-18 LAB — SURGICAL PATHOLOGY

## 2019-02-18 NOTE — Anesthesia Postprocedure Evaluation (Signed)
Anesthesia Post Note  Patient: Brenda Hawkins  Procedure(s) Performed: LAPAROSCOPIC CHOLECYSTECTOMY - DIABETIC (N/A )  Patient location during evaluation: PACU Anesthesia Type: General Level of consciousness: awake and alert Pain management: pain level controlled Vital Signs Assessment: post-procedure vital signs reviewed and stable Respiratory status: spontaneous breathing, nonlabored ventilation and respiratory function stable Cardiovascular status: blood pressure returned to baseline and stable Postop Assessment: no apparent nausea or vomiting Anesthetic complications: no     Last Vitals:  Vitals:   02/17/19 0945 02/17/19 1005  BP: (!) 164/87 (!) 162/80  Pulse: 84 83  Resp: 16 16  Temp:  (!) 36.3 C  SpO2: 94% 99%    Last Pain:  Vitals:   02/17/19 1005  TempSrc: Temporal  PainSc: [place]

## 2019-02-22 ENCOUNTER — Ambulatory Visit: Payer: 59 | Admitting: Hematology and Oncology

## 2019-02-22 ENCOUNTER — Ambulatory Visit: Payer: 59

## 2019-02-22 ENCOUNTER — Other Ambulatory Visit: Payer: 59

## 2019-03-04 ENCOUNTER — Other Ambulatory Visit: Payer: Self-pay

## 2019-03-04 ENCOUNTER — Encounter: Payer: Self-pay | Admitting: Surgery

## 2019-03-04 ENCOUNTER — Ambulatory Visit (INDEPENDENT_AMBULATORY_CARE_PROVIDER_SITE_OTHER): Payer: 59 | Admitting: Surgery

## 2019-03-04 VITALS — BP 173/120 | HR 106 | Temp 98.1°F | Resp 16 | Ht 60.0 in | Wt 130.2 lb

## 2019-03-04 DIAGNOSIS — K8020 Calculus of gallbladder without cholecystitis without obstruction: Secondary | ICD-10-CM

## 2019-03-04 DIAGNOSIS — Z4889 Encounter for other specified surgical aftercare: Secondary | ICD-10-CM

## 2019-03-04 NOTE — Patient Instructions (Signed)

## 2019-03-04 NOTE — Progress Notes (Signed)
Surgical Clinic Progress/Follow-up Note   HPI:  53 y.o. Female presents to clinic for post-op follow-up 15 Days s/p laparoscopic cholecystectomy Brenda Hawkins, 02/17/2019). Patient states "you are my hero", reports complete resolution of all of her formerly debilitating pre-operative pain and and nausea/vomiting with only occasional minimal residual GERD and has been tolerating regular diet with +flatus and normal BM's (says "everything has normalized"), denies N/V, fever/chills, CP, or SOB.  Review of Systems:  Constitutional: denies fever/chills  Respiratory: denies shortness of breath, wheezing  Cardiovascular: denies chest pain, palpitations  Gastrointestinal: abdominal pain, N/V, and bowel function as per interval history Skin: Denies any other rashes or skin discolorations except post-surgical wounds as per interval history  Vital Signs:  BP (!) 173/120   Pulse (!) 106   Temp 98.1 F (36.7 C) (Temporal)   Resp 16   Ht 5' (1.524 m)   Wt 130 lb 3.2 oz (59.1 kg)   SpO2 97%   BMI 25.43 kg/m    Physical Exam:  Constitutional:  -- Normal body habitus  -- Awake, alert, and oriented x3  Pulmonary:  -- No crackles -- Equal breath sounds bilaterally -- Breathing non-labored at rest Cardiovascular:  -- S1, S2 present  -- No pericardial rubs  Gastrointestinal:  -- Soft and non-distended, non-tender to palpation, no guarding/rebound tenderness -- Post-surgical incisions all well-approximated without any peri-incisional erythema or drainage -- No abdominal masses appreciated, pulsatile or otherwise  Musculoskeletal / Integumentary:  -- Wounds or skin discoloration: None appreciated except post-surgical incisions as described above (GI) -- Extremities: B/L UE and LE FROM, hands and feet warm, no edema   Imaging: No new pertinent imaging available for review   Assessment:  53 y.o. yo Female with a problem list including...  Patient Active Problem List   Diagnosis Date Noted  .  Symptomatic cholelithiasis 02/05/2019  . Right nephrolithiasis 10/24/2018  . Itching 10/15/2018  . Epigastric pain   . Erythrocytosis 11/04/2017  . Breast asymmetry 11/04/2017  . Macrocytosis 11/04/2017  . B12 deficiency 08/28/2016  . Salicylate poisoning 19/37/9024  . Peripheral edema 03/24/2014  . HYPERCHOLESTEROLEMIA 08/11/2008  . Anemia, iron deficiency 08/11/2008  . Diabetes mellitus type II, controlled ([place]) 06/02/2007  . Osteopenia 06/02/2007  . OBESITY 12/19/2006  . ANEMIA, VITAMIN B12 DEFICIENCY 12/19/2006  . ANXIETY 12/19/2006  . 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 Essential hypertension, benign 12/19/2006  . ASTHMA 12/19/2006  . GERD 12/19/2006  . SLEEP APNEA 12/19/2006    presents to clinic for post-op follow-up evaluation, doing very well 15 Days s/p laparoscopic cholecystectomy Brenda Hawkins, 02/17/2019) for severely symptomatic cholelithiasis.  Plan:              - advance diet as tolerated  - once daily omeprazole if GERD persists             - okay to submerge incisions under water (baths, swimming) prn             - gradually resume all activities without restrictions over next 2 weeks             - apply sunblock particularly to incisions with sun exposure to reduce pigmentation of scars             - return to clinic as needed, instructed to call office if any questions or concerns  All of the above recommendations were discussed with the patient, and all of patient's questions were answered to her expressed satisfaction.  -- Marilynne Drivers Brenda Hoes, MD,  RPVI [place]: [facility] General Surgery - Partnering for exceptional care. Office: [phone] 

## 2019-03-05 ENCOUNTER — Encounter: Payer: Self-pay | Admitting: Surgery

## 2019-03-05 ENCOUNTER — Encounter: Payer: Self-pay | Admitting: Hematology and Oncology

## 2019-03-07 NOTE — Progress Notes (Signed)
[facility]  [address], [address] [place], [place] [address] Phone: [phone]  Fax: [phone]   Clinic Day:  03/08/2019  Referring physician: Excell SeltzerBedsole, Amy E, MD  Chief Complaint: Brenda Hawkins is a 53 y.o. female s/p gastric bypass surgery with iron deficiency anemia and B12 deficiency who is seen for 2 month assessment.  HPI: The patient was last seen in the hematology clinic on 12/30/2018 via telemedicine. At that time, she was slowly improving s/p recent kidney stone, stent placement and removal.  Labs on 01/07/2019: WBC 8,500, ANC 6,700, hemoglobin 16.6, hematocrit 46.7, MCV 106.9, platelets 342,000. Erythropoietin 19.3 (2.6-18.5). JAK2 V617F, exons 12-15, CALR, and MPL were negative. Carbon monoxide 11.8% (0-3.6%).   She was seen in urology on 01/04/2019 by Michiel CowboyShannon McGowan, PA for right flank pain, microscopic hematuria, and gall stones. She was advised on 01/06/2019 to go to the ER for persistent pain and low grade fevers, but declined due to fears of COVID-19. She was seen by Dr. Lonna CobbStoioff on 01/12/2019. RUQ ultrasound and general surgery referral were ordered.   RUQ ultrasound on 01/22/2019 showed cholelithiasis without inflammation.There were no other abnormalities.   She was seen by Dr. Satira MccallumJason Davis, general surgery, on 02/04/2019. She underwent a cholecystectomy on 02/17/2019.  She did not qualify for the lung cancer screening program because she is not yet 54yo. Follow-up is planned for next year.   During the interim, she is recovering from her surgery well. Her fatigue and weakness have considerably improved. She has soreness and occasional twinges of pain at her surgical site. She denies any fevers, sweats, or weight loss. She denies any nausea, vomiting or diarrhea. She denies blood in her urine or stools. She is not eating well and notes her decreased appetite has not improved.    Her smoking fluctuates, where some days she doesn't smoke  at all and others she smokes a pack. She has not used her CPAP machine in 20 years and does not want to undergo another sleep study.    Past Medical History:  Diagnosis Date  . Anemia    in past  . Anxiety    no longer  . Asthma    seasonal  . Diabetes mellitus type II    no longer  . GERD (gastroesophageal reflux disease)    no longer  . Obesity   . Osteopenia   . Sleep apnea    no longer  . Symptomatic cholelithiasis 02/05/2019  . 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 Vitamin B12 deficiency    since bariatric surgery    Past Surgical History:  Procedure Laterality Date  . CHOLECYSTECTOMY N/A 02/17/2019   Procedure: LAPAROSCOPIC CHOLECYSTECTOMY - DIABETIC;  Surgeon: Ancil Linseyavis, Jason Evan, MD;  Location: [facility] ORS;  Service: General;  Laterality: N/A;  . CYSTOSCOPY W/ URETERAL STENT PLACEMENT Left 11/11/2018   Procedure: CYSTOSCOPY WITH STENT REMOVAL;  Surgeon: Riki AltesStoioff, Scott C, MD;  Location: [facility] OR[facility];  Service: Urology;  Laterality: Left;  . CYSTOSCOPY WITH HOLMIUM LASER LITHOTRIPSY Left 10/24/2018   Procedure: CYSTOSCOPY WITH HOLMIUM LASER LITHOTRIPSy;  Surgeon: Crista ElliotBell, Eugene D III, MD;  Location: [facility];  Service: Urology;  Laterality: Left;  . CYSTOSCOPY WITH STENT PLACEMENT Bilateral 10/24/2018   Procedure: CYSTOSCOPY WITH STENT PLACEMENT;  Surgeon: Crista ElliotBell, Eugene D III, MD;  Location: [facility];  Service: Urology;  Laterality: Bilateral;  . CYSTOSCOPY/URETEROSCOPY/HOLMIUM LASER/STENT PLACEMENT Right 11/11/2018   Procedure: CYSTOSCOPY/URETEROSCOPY/HOLMIUM LASER/STENT EXCHANGE;  Surgeon: Riki AltesStoioff, Scott C, MD;  Location: [facility];  Service: Urology;  Laterality: Right;  . ESOPHAGOGASTRODUODENOSCOPY (EGD) WITH PROPOFOL N/A 06/01/2018   Procedure: ESOPHAGOGASTRODUODENOSCOPY (EGD) WITH PROPOFOL;  Surgeon: Toney ReilVanga, Rohini Reddy, MD;  [facility];  Service: Gastroenterology;  Laterality: N/A;  . GALLBLADDER SURGERY  02/17/2019  . 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 REDUCTION MAMMAPLASTY Right 1992  .  URETEROSCOPY Left 10/24/2018   Procedure: URETEROSCOPY;  Surgeon: Crista ElliotBell, Eugene D III, MD;  Location: [facility];  Service: Urology;  Laterality: Left;    Family History  Problem Relation Age of Onset  . Heart disease Mother        valve replacement surgery  . Coronary artery disease Father   . Breast cancer Neg Hx     Social History:  reports that she has been smoking. She has been smoking about 1.00 pack per day. She has never used smokeless tobacco. She reports that she does not drink alcohol or use drugs. She quit smoking > 20 years ago, but started smoking again 3-4 years ago. Smoking currently fluctuates between none on some days and a pack on other days. She has a dog named Doctor, general practiceLucy.  [place]. Her husband's name is Tasia CatchingsCraig.   Allergies:  Allergies  Allergen Reactions  . Codeine Sulfate Other (See Comments)    Reaction: "violently ill"  . Penicillins Other (See Comments)    Did it involve swelling of the face/tongue/throat, SOB, or low BP? Unknown Did it involve sudden or severe rash/hives, skin peeling, or any reaction on the inside of your mouth or nose? Unknown Did you need to seek medical attention at a hospital or doctor's office? Unknown When did it last happen?Childhood allergy If all above answers are "NO", may proceed with cephalosporin use.   . Adhesive [Tape] Itching and Rash    PAPER TAPE IS FINE  . Latex Itching and Rash    Current Medications: No current outpatient medications on file.   No current facility-administered medications for this visit.    Facility-Administered Medications Ordered in Other Visits  Medication Dose Route Frequency Provider Last Rate Last Dose  . sodium chloride 0.9 % injection 10 mL  10 mL Intracatheter PRN Jeralyn RuthsFinnegan, Timothy J, MD        Review of Systems  Constitutional: Positive for malaise/fatigue (improved) and weight loss (1lb). Negative for chills, diaphoresis and fever.       Feels "good."  HENT:  Negative.  Negative for congestion, hearing loss, sinus pain and sore throat.   Eyes: Negative.  Negative for blurred vision.  Respiratory: Negative.  Negative for cough, shortness of breath and wheezing.   Cardiovascular: Negative.  Negative for chest pain, claudication, leg swelling and PND.  Gastrointestinal: Positive for abdominal pain (occasional, at surgical site). Negative for blood in stool, constipation, diarrhea, melena, nausea and vomiting.       Decreased appetite. Not eating well.   Genitourinary: Negative for dysuria, frequency, hematuria and urgency.       Kidney stones s/p stent, lithotripsy, and stent removal. No hematuria. Intermittent pain.  Musculoskeletal: Negative.  Negative for back pain, joint pain and myalgias.  Skin: Negative.  Negative for rash.  Neurological: Negative.  Negative for dizziness, tingling, sensory change, weakness and headaches.  Endo/Heme/Allergies: Negative.  Does not bruise/bleed easily.  Psychiatric/Behavioral: Negative.  Negative for depression, memory loss and substance abuse. The patient is not nervous/anxious and does not have insomnia.   All other systems reviewed and are negative.  Performance status (ECOG): 1  Blood pressure Marland Kitchen(!)  160/87, pulse 87, temperature 98.6 F (37 C), temperature source Tympanic, resp. rate 18, height 5' (1.524 m), weight 129 lb 4.8 oz (58.7 kg), SpO2 100 %.   Physical Exam  Constitutional: She is oriented to person, place, and time. She appears well-developed and well-nourished. No distress.  HENT:  Head: Normocephalic and atraumatic.  Mouth/Throat: Oropharynx is clear and moist. No oropharyngeal exudate.  Shoulder length curly gray hair.  Mask.  Eyes: Pupils are equal, round, and reactive to light. Conjunctivae and EOM are normal. No scleral icterus.  Glasses.  Blue eyes.  Neck: Normal range of motion. Neck supple.  Cardiovascular: Normal rate, regular rhythm and normal heart sounds.  No murmur heard.  Pulmonary/Chest: Effort normal and breath sounds normal. No respiratory distress. She has no wheezes.  Abdominal: Soft. Bowel sounds are normal. She exhibits no distension. There is abdominal tenderness (slight) in the right upper quadrant. There is no rebound and no guarding.  S/p laparoscopic cholecystectomy   Musculoskeletal: Normal range of motion.        General: No edema.  Lymphadenopathy:    She has no cervical adenopathy.    She has no axillary adenopathy.       Right: No supraclavicular adenopathy present.       Left: No supraclavicular adenopathy present.  Neurological: She is alert and oriented to person, place, and time.  Skin: Skin is warm and dry. She is not diaphoretic. No erythema.  Psychiatric: She has a normal mood and affect. Her behavior is normal. Judgment and thought content normal.  Nursing note and vitals reviewed.   Appointment on 03/08/2019  Component Date Value Ref Range Status  . WBC 03/08/2019 5.2  4.0 - 10.5 K/uL Final  . RBC 03/08/2019 3.93  3.87 - 5.11 MIL/uL Final  . Hemoglobin 03/08/2019 14.9  12.0 - 15.0 g/dL Final  . HCT 03/08/2019 43.8  36.0 - 46.0 % Final  . MCV 03/08/2019 111.5* 80.0 - 100.0 fL Final  . MCH 03/08/2019 37.9* 26.0 - 34.0 pg Final  . MCHC 03/08/2019 34.0  30.0 - 36.0 g/dL Final  . RDW 03/08/2019 12.9  11.5 - 15.5 % Final  . Platelets 03/08/2019 326  150 - 400 K/uL Final  . nRBC 03/08/2019 0.0  0.0 - 0.2 % Final  . Neutrophils Relative % 03/08/2019 69  % Final  . Neutro Abs 03/08/2019 3.6  1.7 - 7.7 K/uL Final  . Lymphocytes Relative 03/08/2019 20  % Final  . Lymphs Abs 03/08/2019 1.1  0.7 - 4.0 K/uL Final  . Monocytes Relative 03/08/2019 7  % Final  . Monocytes Absolute 03/08/2019 0.4  0.1 - 1.0 K/uL Final  . Eosinophils Relative 03/08/2019 2  % Final  . Eosinophils Absolute 03/08/2019 0.1  0.0 - 0.5 K/uL Final  . Basophils Relative 03/08/2019 1  % Final  . Basophils Absolute 03/08/2019 0.0  0.0 - 0.1 K/uL Final  . Immature  Granulocytes 03/08/2019 1  % Final  . Abs Immature Granulocytes 03/08/2019 0.05  0.00 - 0.07 K/uL Final   Performed at [facility], [address] [place], [place] [address]    Assessment:  Jessyca Sloan is a 53 y.o. female status post gastric bypass surgery in 03/2002 and subsequent development of iron deficiency anemia and B12 deficiency.  She is intolerant of oral iron.  She denies any melena or hematochezia.  Stool guaiacs were negative on 01/02/2015.  Diet is poor.  She denies any melena or hematochezia.  She  has never had a colonoscopy.  Patient refuses colonoscopy.  She has B12 deficiency.  B12 was 172 on 06/10/2014 and 250 on 12/31/2016.  She receives B12 injections (began 01/16/2015; last 12/28/2018).  Folate was 12.8 on 11/02/2018.  She has iron deficiency.  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  She received Venofer x4 (01/16/2015 - 02/07/2015), 10/10/2015, 05/06/2017, x 3 (05/08/2017 - 05/22/2017), and x3 (02/09/2018 - 02/23/2018).   Ferritin has been followed: 1.5 on 09/19/2014, 59 on 02/07/2015, 10 on 03/16/2015, 6 on 06/16/2015, 5 on 10/09/2015, 5 on 01/01/2016, 6 on 04/01/2016, 14 on 07/08/2016, 13 on 08/06/2016, 5 on 05/07/2017, 35 on 08/08/2017, 6 on 11/04/2017, 7 on 02/03/2018, 19 on 05/05/2018, 16 on 08/04/2018, 111 on 11/02/2018, 160 on 11/30/2018, and 97 on 12/28/2018.  EGD on 06/01/2018 revealed patent Roux-en-Y gastrojejunostomy characterized by an intact staple line, visible sutures, congestion, erythema, friable mucosa and ulceration.  There was a large clean based gastrojejunal anastomotic ulcer.  There was a normal duodenal bulb and second portion of the duodenum. There was bleeding erosive gastropathy treated with bipolar cautery.  There was normal gastroesophageal junction and esophagus.  Biopsies were negative for malignancy.  There was no H pylori. She is on Nexium 40 mg BID.  Abdomen and pelvic CT on 10/23/2018 revealed moderate to  severe right hydronephrosis due to 10 mm calculus at the right UPJ. There was moderate left hydronephrosis due to 4 mm calculus in distal left ureter and 3 mm calculus at left UVJ.  There was tiny bilateral intrarenal calculi.  There was cholelithiasis and colonic diverticulosis. She underwent bilateral stent placement on 10/24/2018.  She underwent cystoscopy, right ureteroscopy and stone removal, ureteroscopic laser lithotripsy, right ureteral stent placement, left ureteral stent removal, and right retrograde pyelography on 11/11/2018.  She underwent right stent removal on 11/17/2018.   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  She denies any liver disease.  Thyroid studies have been normal in the past.  Labs on 07/08/2016 revealed the following normal labs:  folate (11.4), TSH (1.563), and reticulocyte count (1.7%).  Folate was 6.5 on 08/08/2017.  TSH was normal on 09/08/2017 and 05/05/2018.  She has a history of sleep apnea.  She previously used CPAP when she was heavier.  She is not using CPAP now.   She smokes 1 1/2 packs per day.  She has an increasing hematocrit despite low iron stores suggestive of secondary erythrocytosis due to smoking, sleep apnea, or a myeloproliferative disorder.  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  Work-up on 01/07/2019 revealed carbon monoxide 11.8% (0-3.6%), erythropoietin 19.3 (2.6-18.5). JAK2 V617F, exons 12-15, CALR, and MPL were negative.  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  She was taking over 50 BC powders a week for chronic headaches.  She had tinnitus.  Salicylate level was 39.6 (2.8-30).  She was hydrated in the ER on 07/09/2016.  Follow-up salicylate level was 16.3.  Bilateral mammogram on 05/29/2017 revealed calcifications and possible asymmetry in the right breast.  Left breast was unremarkable.  Diagnostic mammogram and ultrasound of the right breast were ordered, but not performed.  Patient  refuses further breast imaging.  She underwent a cholecystectomy on 02/17/2019.  Symptomatically, she is feeling better.  Her weakness has improved.  Exam is unremarkable.  Plan: 1.  Labs today: CBC w/ diff, ferritin.  2.   Iron deficiency             Hematocrit 43.8.  Hemoglobin 14.9.  MCV 111.5.             Ferritin 155.             No Venofer needed. 3.   B12 deficiency             Patient last received B12 on 12/28/2018.             B12 today and monthly x6. 4.   Macrocytic RBC indices             Etiology remains unclear.             She has no known liver disease or myelodysplastic syndrome (MDS).             B12 deficiency has been treated.             Folate was 12.8 on 11/02/2018.             Plan follow-up TSH follow-up (last checked 05/05/2018).             Consider bone marrow if counts decline (r/o MDS). 5.   Nephrolithiasis and chlolelithiasis             Patient s/p laser lithotripsy, stent placement and removal.  Patient s/p cholecystectomy on 02/17/2019. 6.   Erythrocytosis             Hemoglobin today is 14.9.  Hemoglobin in the past has ranged between 16.4-17.2.             Work-up on 01/07/2019 revealed    Eerythropoietin 19.3 (2.6-18.5).    JAK2 V617F, exons 12-15, CALR, and MPL were negative.    Carbon monoxide 11.8% (0-3.6%).   Etiology felt secondary to smoking (continues) and sleep apnea (prior use of CPAP).              Encourage smoking cessation. 7.   Health maintenance             Patient does not yet qualify for the low-dose chest CT program.             She will be eligible next year when she turns 54. 8.   RTC in 3 months for MD assessment, labs (CBC with diff, ferritin-day before), B12 and +/- Venofer.  I discussed the assessment and treatment plan with the patient.  The patient was provided an opportunity to ask questions and all were answered.  The patient agreed with the plan and demonstrated an understanding of the instructions.  The patient  was advised to call back if the symptoms worsen or if the condition fails to improve as anticipated.  I provided 15 minutes of face-to-face time during this this encounter and > 50% was spent counseling as documented under my assessment and plan.    Rosey BathMelissa C Corcoran, MD, PhD    03/08/2019, 10:42 AM  I, Molly Dorshimer, am acting as Neurosurgeonscribe for General MotorsMelissa C. Merlene Pullingorcoran, MD, PhD.  I, Melissa C. Merlene Pullingorcoran, MD, have reviewed the above documentation for accuracy and completeness, and I agree with the above.

## 2019-03-08 ENCOUNTER — Other Ambulatory Visit: Payer: Self-pay

## 2019-03-08 ENCOUNTER — Encounter: Payer: Self-pay | Admitting: Hematology and Oncology

## 2019-03-08 ENCOUNTER — Inpatient Hospital Stay: Payer: 59

## 2019-03-08 ENCOUNTER — Inpatient Hospital Stay (HOSPITAL_BASED_OUTPATIENT_CLINIC_OR_DEPARTMENT_OTHER): Payer: 59 | Admitting: Hematology and Oncology

## 2019-03-08 ENCOUNTER — Inpatient Hospital Stay: Payer: 59 | Attending: Hematology and Oncology

## 2019-03-08 VITALS — BP 160/87 | HR 87 | Temp 98.6°F | Resp 18 | Ht 60.0 in | Wt 129.3 lb

## 2019-03-08 DIAGNOSIS — D508 Other iron deficiency anemias: Secondary | ICD-10-CM

## 2019-03-08 DIAGNOSIS — D7589 Other specified diseases of blood and blood-forming organs: Secondary | ICD-10-CM

## 2019-03-08 DIAGNOSIS — E538 Deficiency of other specified B group vitamins: Secondary | ICD-10-CM

## 2019-03-08 DIAGNOSIS — D500 Iron deficiency anemia secondary to blood loss (chronic): Secondary | ICD-10-CM

## 2019-03-08 DIAGNOSIS — D751 Secondary polycythemia: Secondary | ICD-10-CM

## 2019-03-08 LAB — CBC WITH DIFFERENTIAL/PLATELET
Abs Immature Granulocytes: 0.05 10*3/uL (ref 0.00–0.07)
Basophils Absolute: 0 10*3/uL (ref 0.0–0.1)
Basophils Relative: 1 %
Eosinophils Absolute: 0.1 10*3/uL (ref 0.0–0.5)
Eosinophils Relative: 2 %
HCT: 43.8 % (ref 36.0–46.0)
Hemoglobin: 14.9 g/dL (ref 12.0–15.0)
Immature Granulocytes: 1 %
Lymphocytes Relative: 20 %
Lymphs Abs: 1.1 10*3/uL (ref 0.7–4.0)
MCH: 37.9 pg — ABNORMAL HIGH (ref 26.0–34.0)
MCHC: 34 g/dL (ref 30.0–36.0)
MCV: 111.5 fL — ABNORMAL HIGH (ref 80.0–100.0)
Monocytes Absolute: 0.4 10*3/uL (ref 0.1–1.0)
Monocytes Relative: 7 %
Neutro Abs: 3.6 10*3/uL (ref 1.7–7.7)
Neutrophils Relative %: 69 %
Platelets: 326 10*3/uL (ref 150–400)
RBC: 3.93 MIL/uL (ref 3.87–5.11)
RDW: 12.9 % (ref 11.5–15.5)
WBC: 5.2 10*3/uL (ref 4.0–10.5)
nRBC: 0 % (ref 0.0–0.2)

## 2019-03-08 LAB — FERRITIN: Ferritin: 155 ng/mL (ref 11–307)

## 2019-03-08 MED ORDER — CYANOCOBALAMIN 1000 MCG/ML IJ SOLN
1000.0000 ug | Freq: Once | INTRAMUSCULAR | Status: AC
  Administered 2019-03-08: 1000 ug via INTRAMUSCULAR

## 2019-03-08 NOTE — Progress Notes (Signed)
No new changes noted today 

## 2019-03-08 NOTE — Patient Instructions (Signed)

## 2019-03-22 ENCOUNTER — Ambulatory Visit: Payer: 59

## 2019-04-02 ENCOUNTER — Other Ambulatory Visit: Payer: Self-pay

## 2019-04-05 ENCOUNTER — Inpatient Hospital Stay: Payer: 59 | Attending: Hematology and Oncology

## 2019-04-05 ENCOUNTER — Other Ambulatory Visit: Payer: Self-pay

## 2019-04-05 VITALS — BP 148/91 | HR 89 | Temp 99.2°F | Resp 18

## 2019-04-05 DIAGNOSIS — D508 Other iron deficiency anemias: Secondary | ICD-10-CM

## 2019-04-05 DIAGNOSIS — E538 Deficiency of other specified B group vitamins: Secondary | ICD-10-CM | POA: Diagnosis present

## 2019-04-05 MED ORDER — CYANOCOBALAMIN 1000 MCG/ML IJ SOLN
1000.0000 ug | Freq: Once | INTRAMUSCULAR | Status: AC
  Administered 2019-04-05: 1000 ug via INTRAMUSCULAR
  Filled 2019-04-05: qty 1

## 2019-04-19 ENCOUNTER — Ambulatory Visit: Payer: 59

## 2019-04-30 ENCOUNTER — Other Ambulatory Visit: Payer: Self-pay

## 2019-05-03 ENCOUNTER — Inpatient Hospital Stay: Payer: 59

## 2019-05-04 ENCOUNTER — Inpatient Hospital Stay: Payer: 59 | Attending: Hematology and Oncology

## 2019-05-04 ENCOUNTER — Other Ambulatory Visit: Payer: Self-pay

## 2019-05-04 VITALS — BP 159/90 | HR 89 | Temp 98.5°F | Resp 20

## 2019-05-04 DIAGNOSIS — E538 Deficiency of other specified B group vitamins: Secondary | ICD-10-CM | POA: Diagnosis present

## 2019-05-04 DIAGNOSIS — D508 Other iron deficiency anemias: Secondary | ICD-10-CM

## 2019-05-04 MED ORDER — CYANOCOBALAMIN 1000 MCG/ML IJ SOLN
1000.0000 ug | Freq: Once | INTRAMUSCULAR | Status: AC
  Administered 2019-05-04: 14:00:00 1000 ug via INTRAMUSCULAR

## 2019-05-18 ENCOUNTER — Ambulatory Visit: Payer: 59

## 2019-05-28 ENCOUNTER — Inpatient Hospital Stay: Payer: 59

## 2019-05-31 ENCOUNTER — Ambulatory Visit: Payer: 59

## 2019-05-31 ENCOUNTER — Ambulatory Visit: Payer: 59 | Admitting: Nurse Practitioner

## 2019-05-31 ENCOUNTER — Inpatient Hospital Stay: Payer: 59

## 2019-06-14 ENCOUNTER — Ambulatory Visit: Payer: 59

## 2019-06-25 ENCOUNTER — Other Ambulatory Visit: Payer: Self-pay

## 2019-06-28 ENCOUNTER — Other Ambulatory Visit: Payer: Self-pay | Admitting: Hematology and Oncology

## 2019-06-28 ENCOUNTER — Inpatient Hospital Stay: Payer: 59

## 2019-06-28 ENCOUNTER — Inpatient Hospital Stay: Payer: 59 | Attending: Hematology and Oncology

## 2019-06-28 ENCOUNTER — Other Ambulatory Visit: Payer: Self-pay

## 2019-06-28 DIAGNOSIS — E538 Deficiency of other specified B group vitamins: Secondary | ICD-10-CM | POA: Diagnosis not present

## 2019-06-28 LAB — BASIC METABOLIC PANEL
Anion gap: 8 (ref 5–15)
BUN: 23 mg/dL — ABNORMAL HIGH (ref 6–20)
CO2: 24 mmol/L (ref 22–32)
Calcium: 9.1 mg/dL (ref 8.9–10.3)
Chloride: 106 mmol/L (ref 98–111)
Creatinine, Ser: 0.63 mg/dL (ref 0.44–1.00)
GFR calc Af Amer: >60 mL/min (ref >60)
GFR calc non Af Amer: >60 mL/min (ref >60)
Glucose, Bld: 128 mg/dL — ABNORMAL HIGH (ref 70–99)
Potassium: 3.8 mmol/L (ref 3.5–5.1)
Sodium: 138 mmol/L (ref 135–145)

## 2019-06-28 LAB — CBC WITH DIFFERENTIAL/PLATELET
Abs Immature Granulocytes: 0.04 10*3/uL (ref 0.00–0.07)
Basophils Absolute: 0.1 10*3/uL (ref 0.0–0.1)
Basophils Relative: 1 %
Eosinophils Absolute: 0.3 10*3/uL (ref 0.0–0.5)
Eosinophils Relative: 4 %
HCT: 47.7 % — ABNORMAL HIGH (ref 36.0–46.0)
Hemoglobin: 16.5 g/dL — ABNORMAL HIGH (ref 12.0–15.0)
Immature Granulocytes: 1 %
Lymphocytes Relative: 20 %
Lymphs Abs: 1.4 10*3/uL (ref 0.7–4.0)
MCH: 36.6 pg — ABNORMAL HIGH (ref 26.0–34.0)
MCHC: 34.6 g/dL (ref 30.0–36.0)
MCV: 105.8 fL — ABNORMAL HIGH (ref 80.0–100.0)
Monocytes Absolute: 0.5 10*3/uL (ref 0.1–1.0)
Monocytes Relative: 8 %
Neutro Abs: 4.8 10*3/uL (ref 1.7–7.7)
Neutrophils Relative %: 66 %
Platelets: 235 10*3/uL (ref 150–400)
RBC: 4.51 MIL/uL (ref 3.87–5.11)
RDW: 12 % (ref 11.5–15.5)
WBC: 7.1 10*3/uL (ref 4.0–10.5)
nRBC: 0 % (ref 0.0–0.2)

## 2019-06-28 LAB — FERRITIN: Ferritin: 14 ng/mL (ref 11–307)

## 2019-06-28 MED ORDER — CYANOCOBALAMIN 1000 MCG/ML IJ SOLN
1000.0000 ug | Freq: Once | INTRAMUSCULAR | Status: AC
  Administered 2019-06-28: 15:00:00 1000 ug via INTRAMUSCULAR

## 2019-06-28 NOTE — Patient Instructions (Signed)

## 2019-06-30 ENCOUNTER — Encounter: Payer: Self-pay | Admitting: Hematology and Oncology

## 2019-06-30 NOTE — Progress Notes (Signed)
No new changes noted today. The patient name and DOB has been verified by phone. 

## 2019-06-30 NOTE — Progress Notes (Signed)
[facility]  [address], [address] [place], [place] [address] Phone: [phone]  Fax: [phone]   Clinic Day:  07/01/2019  Referring physician: Jinny Sanders, MD  Chief Complaint: Brenda Hawkins is a 53 y.o. female with s/p gastric bypass surgery with iron deficiency anemia and B12 deficiency who is seen for 4 month assessment.  HPI: The patient was last seen in the hematology clinic on 03/08/2019. At that time, she was feeling better. Her weakness had improved. Exam was unremarkable. Hematocrit 43.8, hemoglobin 14.9, MCV 111.5. Ferritin was 155. No Venofer was needed. She received a B-12 injection.   Patient received B-12 injections on 04/05/2019, 05/04/2019, 06/28/2019.   Labs on 06/28/2019 revealed a hematocrit 47.7, hemoglobin 16.5, MCV 105.8, platelets 235,000, WBC 7,100. Ferritin was 14.   During the interim, she has felt "nervous about getting IV iron". Her BP is 166/95 in the clinic today. She notes missing her B-12 injections because she was taking care of her mother-in-law. Her mother-in-law died 4 days. She notes being under a lot of stress.  She has cut back on smoking. She is smoking a little over a pack a day. She wants to start [place] in 07/2019. She does not drink alcohol. She has some tingling and numbness in her fingers.  She is maintaining a good diet and eating well. She has mild discomfort in her abdomen at the surgical site. She has no kidney stone issues. She denies any blood in her stool and melena. She denies any sleep apnea.   She is still thinking about the low dose chest CT program. She will qualify in 12/2019.    Past Medical History:  Diagnosis Date  . Anemia    in past  . Anxiety    no longer  . Asthma    seasonal  . Diabetes mellitus type II    no longer  . GERD (gastroesophageal reflux disease)    no longer  . Obesity   . Osteopenia   . Sleep apnea    no longer  . Symptomatic cholelithiasis 02/05/2019  .  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 Vitamin B12 deficiency    since bariatric surgery    Past Surgical History:  Procedure Laterality Date  . CHOLECYSTECTOMY N/A 02/17/2019   Procedure: LAPAROSCOPIC CHOLECYSTECTOMY - DIABETIC;  Surgeon: Vickie Epley, MD;  Location: [facility] ORS;  Service: General;  Laterality: N/A;  . CYSTOSCOPY W/ URETERAL STENT PLACEMENT Left 11/11/2018   Procedure: CYSTOSCOPY WITH STENT REMOVAL;  Surgeon: Abbie Sons, MD;  Location: [facility] ORS;  Service: Urology;  Laterality: Left;  . CYSTOSCOPY WITH HOLMIUM LASER LITHOTRIPSY Left 10/24/2018   Procedure: CYSTOSCOPY WITH HOLMIUM LASER LITHOTRIPSy;  Surgeon: Lucas Mallow, MD;  Location: [facility] ORS;  Service: Urology;  Laterality: Left;  . CYSTOSCOPY WITH STENT PLACEMENT Bilateral 10/24/2018   Procedure: CYSTOSCOPY WITH STENT PLACEMENT;  Surgeon: Lucas Mallow, MD;  Location: [facility] ORS;  Service: Urology;  Laterality: Bilateral;  . CYSTOSCOPY/URETEROSCOPY/HOLMIUM LASER/STENT PLACEMENT Right 11/11/2018   Procedure: CYSTOSCOPY/URETEROSCOPY/HOLMIUM LASER/STENT EXCHANGE;  Surgeon: Abbie Sons, MD;  Location: [facility] ORS;  Service: Urology;  Laterality: Right;  . ESOPHAGOGASTRODUODENOSCOPY (EGD) WITH PROPOFOL N/A 06/01/2018   Procedure: ESOPHAGOGASTRODUODENOSCOPY (EGD) WITH PROPOFOL;  Surgeon: Lin Landsman, MD;  Location: [facility] ENDOSCOPY;  Service: Gastroenterology;  Laterality: N/A;  . GALLBLADDER SURGERY  02/17/2019  . 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 REDUCTION MAMMAPLASTY Right 1992  . URETEROSCOPY Left 10/24/2018   Procedure: URETEROSCOPY;  Surgeon:  Crista Elliot, MD;  Location: [facility] ORS;  Service: Urology;  Laterality: Left;    Family History  Problem Relation Age of Onset  . Heart disease Mother        valve replacement surgery  . Coronary artery disease Father   . Breast cancer Neg Hx     Social History:  reports that she has been smoking. She has been smoking about 1.00 pack per day. She has never used smokeless  tobacco. She reports that she does not drink alcohol or use drugs. She quit smoking >20 years ago, but started smoking again 3-4 years ago. Smoking currently a little over a pack a day. She would like to try and quit smoking this year (2020). She has a dog named Doctor, general practice.The patient lives in [place].Herhusband's name isCraig. The patient is alone today.  Allergies:  Allergies  Allergen Reactions  . Codeine Sulfate Other (See Comments)    Reaction: "violently ill"  . Penicillins Other (See Comments)    Did it involve swelling of the face/tongue/throat, SOB, or low BP? Unknown Did it involve sudden or severe rash/hives, skin peeling, or any reaction on the inside of your mouth or nose? Unknown Did you need to seek medical attention at a hospital or doctor's office? Unknown When did it last happen?Childhood allergy If all above answers are "NO", may proceed with cephalosporin use.   . Adhesive [Tape] Itching and Rash    PAPER TAPE IS FINE  . Latex Itching and Rash    Current Medications: No current outpatient medications on file.   No current facility-administered medications for this visit.    Facility-Administered Medications Ordered in Other Visits  Medication Dose Route Frequency Provider Last Rate Last Dose  . sodium chloride 0.9 % injection 10 mL  10 mL Intracatheter PRN Jeralyn Ruths, MD        Review of Systems  Constitutional: Negative for chills, diaphoresis, fever, malaise/fatigue and weight loss (stable).       Feels "nervous about getting IV iron".  HENT: Negative.  Negative for congestion, ear pain, hearing loss, nosebleeds, sinus pain and sore throat.   Eyes: Negative.  Negative for blurred vision and double vision.  Respiratory: Negative.  Negative for cough, shortness of breath and wheezing.   Cardiovascular: Negative.  Negative for chest pain, claudication, leg swelling and PND.  Gastrointestinal: Positive for abdominal pain (mild discomfort, at  surgical site). Negative for blood in stool, constipation, diarrhea, melena, nausea and vomiting.       Eating well.  Genitourinary: Negative for dysuria, frequency, hematuria and urgency.       Kidney stones s/p stent, lithotripsy, and stent removal. No hematuria. Intermittent pain.  Musculoskeletal: Negative.  Negative for back pain, joint pain and myalgias.  Skin: Negative.  Negative for rash.  Neurological: Positive for tingling (numbness, fingers). Negative for dizziness, sensory change, focal weakness, weakness and headaches.  Endo/Heme/Allergies: Negative.  Does not bruise/bleed easily.  Psychiatric/Behavioral: Negative for depression, memory loss and substance abuse. The patient is nervous/anxious (fear of getting IV). The patient does not have insomnia.        Stress.  All other systems reviewed and are negative.  Performance status (ECOG): 1  Vitals Blood pressure (!) 166/95, pulse 85, temperature 98.4 F (36.9 C), temperature source Oral, resp. rate 16, weight 129 lb 11.9 oz (58.8 kg), SpO2 99 %.  Physical Exam  Constitutional: She is oriented to person, place, and time. She appears well-developed and well-nourished. No distress.  HENT:  Head: Normocephalic and atraumatic.  Mouth/Throat: Oropharynx is clear and moist. No oropharyngeal exudate.  Short gray hair.  Mask.  Eyes: Pupils are equal, round, and reactive to light. Conjunctivae and EOM are normal. No scleral icterus.  Glasses.  Blue eyes.  Neck: Normal range of motion. Neck supple.  Cardiovascular: Normal rate, regular rhythm and normal heart sounds.  No murmur heard. Pulmonary/Chest: Effort normal and breath sounds normal. No respiratory distress. She has no wheezes. She has no rales.  Abdominal: Soft. Bowel sounds are normal. She exhibits no distension and no mass. There is no abdominal tenderness. There is no rebound and no guarding.  S/p laparoscopic cholecystectomy.  Musculoskeletal: Normal range of motion.         General: No edema.  Lymphadenopathy:    She has no cervical adenopathy.    She has no axillary adenopathy.       Right: No inguinal and no supraclavicular adenopathy present.       Left: No inguinal and no supraclavicular adenopathy present.  Neurological: She is alert and oriented to person, place, and time.  Skin: Skin is warm and dry. No rash noted. She is not diaphoretic. No erythema. No pallor.  Psychiatric: She has a normal mood and affect. Her behavior is normal. Judgment and thought content normal.  Nursing note and vitals reviewed.   No visits with results within 3 Day(s) from this visit.  Latest known visit with results is:  Appointment on 06/28/2019  Component Date Value Ref Range Status  . Ferritin 06/28/2019 14  11 - 307 ng/mL Final   Performed at [facility], [address] [place] [place], [place] [address]  . WBC 06/28/2019 7.1  4.0 - 10.5 K/uL Final  . RBC 06/28/2019 4.51  3.87 - 5.11 MIL/uL Final  . Hemoglobin 06/28/2019 16.5* 12.0 - 15.0 g/dL Final  . HCT 60/45/4098 47.7* 36.0 - 46.0 % Final  . MCV 06/28/2019 105.8* 80.0 - 100.0 fL Final  . MCH 06/28/2019 36.6* 26.0 - 34.0 pg Final  . MCHC 06/28/2019 34.6  30.0 - 36.0 g/dL Final  . RDW 11/91/4782 12.0  11.5 - 15.5 % Final  . Platelets 06/28/2019 235  150 - 400 K/uL Final  . nRBC 06/28/2019 0.0  0.0 - 0.2 % Final  . Neutrophils Relative % 06/28/2019 66  % Final  . Neutro Abs 06/28/2019 4.8  1.7 - 7.7 K/uL Final  . Lymphocytes Relative 06/28/2019 20  % Final  . Lymphs Abs 06/28/2019 1.4  0.7 - 4.0 K/uL Final  . Monocytes Relative 06/28/2019 8  % Final  . Monocytes Absolute 06/28/2019 0.5  0.1 - 1.0 K/uL Final  . Eosinophils Relative 06/28/2019 4  % Final  . Eosinophils Absolute 06/28/2019 0.3  0.0 - 0.5 K/uL Final  . Basophils Relative 06/28/2019 1  % Final  . Basophils Absolute 06/28/2019 0.1  0.0 - 0.1 K/uL Final  . Immature Granulocytes 06/28/2019 1  % Final  . Abs Immature Granulocytes 06/28/2019  0.04  0.00 - 0.07 K/uL Final   Performed at [facility], [address], [place], [place] [address]  . Sodium 06/28/2019 138  135 - 145 mmol/L Final  . Potassium 06/28/2019 3.8  3.5 - 5.1 mmol/L Final  . Chloride 06/28/2019 106  98 - 111 mmol/L Final  . CO2 06/28/2019 24  22 - 32 mmol/L Final  . Glucose, Bld 06/28/2019 128* 70 - 99 mg/dL Final  . BUN 30/86/5784 23* 6 - 20  mg/dL Final  . Creatinine, Ser 06/28/2019 0.63  0.44 - 1.00 mg/dL Final  . Calcium 46/96/295210/19/2020 9.1  8.9 - 10.3 mg/dL Final  . GFR calc non Af Amer 06/28/2019 >60  >60 mL/min Final  . GFR calc Af Amer 06/28/2019 >60  >60 mL/min Final  . Anion gap 06/28/2019 8  5 - 15 Final   Performed at [facility], [address] [place], [place] [id number]    Assessment:  Brenda Hawkins is a 53 y.o. female with status post gastric bypass surgeryin 03/2002 and subsequent development of iron deficiency anemiaand B12 deficiency. She is intolerant of oral iron. She denies any melena or hematochezia. Stool guaiacs were negative on 01/02/2015. Dietis poor. She denies any melena or hematochezia. She has never had a colonoscopy. Patient refuses colonoscopy.  She has B12 deficiency. B12 was 172 on 06/10/2014 and 250 on 12/31/2016. She receives B12 injections (began 01/16/2015; last 06/28/2019). Folate was 12.8 on 11/02/2018.  She has iron deficiency. 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 She received Venoferx4 (01/16/2015 - 02/07/2015), 10/10/2015, 05/06/2017, x 3 (05/08/2017 - 05/22/2017), and x3 (02/09/2018 - 02/23/2018).   Ferritinhas been followed: 1.5 on 09/19/2014, 59 on 02/07/2015, 10 on 03/16/2015, 6 on 06/16/2015, 5 on 10/09/2015, 5 on 01/01/2016, 6 on 04/01/2016, 14 on 07/08/2016, 13 on 08/06/2016, 5 on 05/07/2017, 35 on 08/08/2017, 6 on 11/04/2017, 7 on 02/03/2018, 19 on 05/05/2018, 16 on 08/04/2018, 111 on 11/02/2018, 160 on 11/30/2018, 97 on 12/28/2018, 155 on 03/08/2019 and 14  on 06/28/2019.  EGDon 06/01/2018 revealed patent Roux-en-Y gastrojejunostomy characterized by an intact staple line, visible sutures, congestion, erythema, friable mucosa and ulceration. There was a large clean based gastrojejunal anastomotic ulcer. There was a normal duodenal bulb and second portion of the duodenum. There was bleeding erosive gastropathytreated with bipolar cautery. There was normal gastroesophageal junction and esophagus. Biopsies were negative for malignancy. There was no H pylori. She is on Nexium40 mg BID.  Abdomen and pelvic CTon 10/23/2018 revealed moderate to severe right hydronephrosisdue to 10 mm calculus at the right UPJ. There was moderateleft hydronephrosisdue to 4 mm calculus in distal left ureter and 3 mm calculus at left UVJ. There was tiny bilateral intrarenal calculi. There was cholelithiasis and colonic diverticulosis. She underwent bilateral stentplacement on 10/24/2018.  She underwent cystoscopy, right ureteroscopy and stone removal, ureteroscopic laser lithotripsy, right ureteral stent placement, left ureteral stent removal, and right retrograde pyelography on 11/11/2018. She underwent right stent removal on 11/17/2018.   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 She denies any liver disease. Thyroid studies have been normal in the past. Labs on 10/30/2017revealed the following normal labs: folate (11.4), TSH (1.563), and reticulocyte count (1.7%). Folate was 6.5 on 08/08/2017. TSH was normal on 09/08/2017 and 05/05/2018.  She has a history of sleep apnea. She previously used CPAP when she was heavier. She is not using CPAP now.   She smokes 1 1/2 packs per day. She has an increasing hematocrit despite low iron stores suggestive of secondary erythrocytosisdue to smoking, sleep apnea, or a myeloproliferative disorder.  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  Work-up on 01/07/2019 revealed carbon  monoxide 11.8% (0-3.6%), erythropoietin 19.3 (2.6-18.5). JAK2 V617F, exons 12-15, CALR, and MPL were negative.  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 She was taking over 50 BC powders a week for chronic headaches. She had tinnitus. Salicylate level was 39.6 (2.8-30). She was hydrated in the ER on 07/09/2016. Follow-up salicylate  level was 16.3.  Bilateral mammogramon 05/29/2017 revealed calcifications and possible asymmetry in the right breast. Left breast was unremarkable. Diagnostic mammogram and ultrasound of the right breast were ordered, but not performed. Patient refuses further breast imaging.  She underwent a cholecystectomy on 02/17/2019.  Symptomatically, she has been under a lot of stress.  She would like to stop smoking.  Exam is stable.  Plan: 1.   Review labs from 06/28/2019. 2.Iron deficiency Hematocrit 47.7. Hemoglobin 16.5. MCV 105.8. Ferritin 14.  Discuss no plan for IV iron given polycythemia and likely need for phlebotomy if receives IV iron. 3. B12 deficiency Patient last received B12 on 06/28/2019. Continue monthly B12 x 6. 4. Macrocytic RBC indices Etiology remains unclear. She has no known liver disease or myelodysplastic syndrome (MDS). B12 deficiency has been treated. Folate was 12.8 on 11/02/2018. Anticipate follow-up TSH follow-up (last checked 05/05/2018). Consider bone marrow if counts decline (r/o MDS). 5.Nephrolithiasis and chlolelithiasis Patient s/p laser lithotripsy, stent placement and removal.             Patient s/p cholecystectomy on 02/17/2019.  Continue to monitor. 6.Erythrocytosis Hemoglobin is 16.5 today.             Hemoglobin in the past has ranged between 16.4-17.2. Work-up on 01/07/2019 revealed                          Erythropoietin  19.3 (2.6-18.5).                          JAK2 V617F, exons 12-15, CALR, and MPL were negative.                          Carbon monoxide 11.8% (0-3.6%).              Etiology is secondary to smoking and sleep apnea.   Patient interested in smoking cessation.   Discuss consideration of overnight oximetry. 7. Health maintenance Patient will be eligible for low dose chest CT program when she turns 54. 8.RTC in 3 months for MD assessment (on day B12 due), labs (CBC with diff, ferritin- day before), B12, and +/- Venofer.  I discussed the assessment and treatment plan with the patient.  The patient was provided an opportunity to ask questions and all were answered.  The patient agreed with the plan and demonstrated an understanding of the instructions.  The patient was advised to call back if the symptoms worsen or if the condition fails to improve as anticipated.  I provided 16 minutes of face-to-face time during this this encounter and > 50% was spent counseling as documented under my assessment and plan.    Rosey Bath, MD, PhD    07/01/2019, 10:19 AM  I, Theador Hawthorne, am acting as scribe for [employer]. Merlene Pulling, MD, PhD.  I, Melissa C. Merlene Pulling, MD, have reviewed the above documentation for accuracy and completeness, and I agree with the above.

## 2019-07-01 ENCOUNTER — Inpatient Hospital Stay (HOSPITAL_BASED_OUTPATIENT_CLINIC_OR_DEPARTMENT_OTHER): Payer: 59 | Admitting: Hematology and Oncology

## 2019-07-01 ENCOUNTER — Telehealth: Payer: Self-pay | Admitting: *Deleted

## 2019-07-01 ENCOUNTER — Other Ambulatory Visit: Payer: Self-pay

## 2019-07-01 ENCOUNTER — Inpatient Hospital Stay: Payer: 59

## 2019-07-01 ENCOUNTER — Telehealth: Payer: Self-pay

## 2019-07-01 ENCOUNTER — Encounter: Payer: Self-pay | Admitting: Hematology and Oncology

## 2019-07-01 VITALS — BP 162/100 | HR 85 | Temp 98.4°F | Resp 16 | Wt 129.7 lb

## 2019-07-01 DIAGNOSIS — D509 Iron deficiency anemia, unspecified: Secondary | ICD-10-CM | POA: Diagnosis not present

## 2019-07-01 DIAGNOSIS — D7589 Other specified diseases of blood and blood-forming organs: Secondary | ICD-10-CM

## 2019-07-01 DIAGNOSIS — D751 Secondary polycythemia: Secondary | ICD-10-CM

## 2019-07-01 DIAGNOSIS — E538 Deficiency of other specified B group vitamins: Secondary | ICD-10-CM | POA: Diagnosis not present

## 2019-07-01 DIAGNOSIS — D508 Other iron deficiency anemias: Secondary | ICD-10-CM

## 2019-07-01 NOTE — Progress Notes (Signed)
Patient here for follow up. Denies any concerns.  

## 2019-07-01 NOTE — Telephone Encounter (Signed)
In basket sent to patient's PCP, Dr. Diona Browner d/t phone issues with their office. Informed Dr. Diona Browner of elevated BP reading while in office today.

## 2019-07-01 NOTE — Telephone Encounter (Signed)
-----   Message from Jinny Sanders, MD sent at 07/01/2019 12:37 PM EDT ----- Regarding: RE: HTN Thank you for the update!   Butch Penny, please call pt and have her follow BPs at home for the next 1-2 weeks and call or Mychart measurements.   Eliezer Lofts, MD [place] [facility] ----- Message ----- From: Arlan Organ, RN Sent: 07/01/2019  11:26 AM EDT To: Jinny Sanders, MD Subject: HTN                                            Good Morning,  I attempted to contact the office but it states there are currently issues with the system. I just wanted to make you aware of the patient's BP readings while in the office today. 171/100, 166/95, and 162/100. Patient was extremely nervous during visit as she was anticipating having to have an IV for Iron infusion. She denied any s/s.   Thanks Cindee Lame, RN

## 2019-07-01 NOTE — Telephone Encounter (Signed)
Brenda Hawkins notified as instructed by telephone.  Patient states understanding.

## 2019-07-26 ENCOUNTER — Inpatient Hospital Stay: Payer: 59 | Attending: Hematology and Oncology

## 2019-07-26 ENCOUNTER — Other Ambulatory Visit: Payer: Self-pay

## 2019-07-26 VITALS — BP 177/104 | HR 88 | Temp 98.3°F | Resp 18

## 2019-07-26 DIAGNOSIS — K9589 Other complications of other bariatric procedure: Secondary | ICD-10-CM

## 2019-07-26 DIAGNOSIS — E538 Deficiency of other specified B group vitamins: Secondary | ICD-10-CM | POA: Insufficient documentation

## 2019-07-26 DIAGNOSIS — D509 Iron deficiency anemia, unspecified: Secondary | ICD-10-CM

## 2019-07-26 MED ORDER — CYANOCOBALAMIN 1000 MCG/ML IJ SOLN
1000.0000 ug | Freq: Once | INTRAMUSCULAR | Status: AC
  Administered 2019-07-26: 1000 ug via INTRAMUSCULAR
  Filled 2019-07-26: qty 1

## 2019-07-26 NOTE — Progress Notes (Signed)
Patient educated about HTN. BP written down for patient to provide to PCP as she has been tracking BP's for her PCP. Patient advised if she has chest pain. SOB, etc. To go to ER. Patient verbalizes understanding and denies any further questions or concerns.

## 2019-07-26 NOTE — Patient Instructions (Signed)

## 2019-08-03 ENCOUNTER — Ambulatory Visit: Payer: Self-pay

## 2019-08-03 NOTE — Telephone Encounter (Signed)
pts temp now is 99.1; spoke with Gentry Fitz NP and offered pt an appt today but pt said she could not come today unless late appt. No appt available after pt wanted to come. Pt said she took Tylenol and pain level now is 4. Pt scheduled 30' appt in office to see Dr Darnell Level on 08/04/19 at 10:30. ED precautions given and pt voiced understanding. Pt said she has had symptoms for 1 yr but is getting worse and does not want to wait until next wk to be seen.FYI to Dr Darnell Level.

## 2019-08-03 NOTE — Telephone Encounter (Signed)
I spoke with pt;pt does not want to wait until 08/12/19. Pt has covid symptoms of abd pain, vomiting, weakness,pt feels hot but has not taken temp; no travel and no known exposure to + covid. Pt does not have faith in [facility] or [facility] ED and pt is not sure why she needs to go to ED. Pt expects her PCP to get testing ordered. I explained why pt cannot come into office but pt said her PCP could still order the test. When she was in ED before no one examined her there. Pt request cb. Dr Diona Browner is out of office. Pt apologized and said she is not trying to be difficult to get along with but she does not have faith in ED's. FYI to Dr Darnell Level.

## 2019-08-03 NOTE — Telephone Encounter (Signed)
Incoming call from Patient with a complaint of back pain and right flank pain.  State that she vomited 6x yesterday.  States that she radiates to her underarms.  Pain comes and goes.  States Pain is rated moderate to severe.  Has taken Nexium for pain  Helps some.  Reviewed protocol with Patient.  Scheduled appoint scheduled  Appt. 08/12/19. Voiced understanding.          Answer Assessment - Initial Assessment Questions 1. LOCATION: "Where does it hurt?"      In the middle 2. RADIATION: "Does the pain shoot anywhere else?" (e.g., chest, back)     Go to back  underarms 3. ONSET: "When did the pain begin?" (e.g., minutes, hours or days ago)     Dealing with for about about a year.   4. SUDDEN: "Gradual or sudden onset?"     *No Answer* 5. PATTERN "Does the pain come and go, or is it constant?"    - If constant: "Is it getting better, staying the same, or worsening?"      (Note: Constant means the pain never goes away completely; most serious pain is constant and it progresses)     - If intermittent: "How long does it last?" "Do you have pain now?"     (Note: Intermittent means the pain goes away completely between bouts)    Comes and goes 6. SEVERITY: "How bad is the pain?"  (e.g., Scale 1-10; mild, moderate, or severe)   - MILD (1-3): doesn't interfere with normal activities, abdomen soft and not tender to touch    - MODERATE (4-7): interferes with normal activities or awakens from sleep, tender to touch    - SEVERE (8-10): excruciating pain, doubled over, unable to do any normal activities     Moderate to severe 7. RECURRENT SYMPTOM: "Have you ever had this type of abdominal pain before?" If so, ask: "When was the last time?" and "What happened that time?"       8. CAUSE: "What do you think is causing the abdominal pain?"     *No Answer* 9. RELIEVING/AGGRAVATING FACTORS: "What makes it better or worse?" (e.g., movement, antacids, bowel movement)     Denies nexium 10. OTHER SYMPTOMS:  "Has there been any vomiting, diarrhea, constipation, or urine problems?"    denies 11. PREGNANCY: "Is there any chance you are pregnant?" "When was your last menstrual period?"      na  Protocols used: ABDOMINAL PAIN - [facility]

## 2019-08-03 NOTE — Telephone Encounter (Addendum)
She should check temperature. If no fever, I think would be reasonable to start in office with abd and flank pain and vomiting. Don't think we need to order Covid test without seeing her. Would route to provider she ends up seeing to ensure ok by them. If severe pain, do recommend ER eval.

## 2019-08-04 ENCOUNTER — Other Ambulatory Visit: Payer: Self-pay

## 2019-08-04 ENCOUNTER — Ambulatory Visit: Payer: 59 | Admitting: Family Medicine

## 2019-08-04 ENCOUNTER — Encounter: Payer: Self-pay | Admitting: Family Medicine

## 2019-08-04 VITALS — BP 140/88 | HR 100 | Temp 97.7°F | Ht 60.0 in | Wt 133.1 lb

## 2019-08-04 DIAGNOSIS — K2940 Chronic atrophic gastritis without bleeding: Secondary | ICD-10-CM | POA: Diagnosis not present

## 2019-08-04 DIAGNOSIS — R1013 Epigastric pain: Secondary | ICD-10-CM

## 2019-08-04 DIAGNOSIS — K279 Peptic ulcer, site unspecified, unspecified as acute or chronic, without hemorrhage or perforation: Secondary | ICD-10-CM

## 2019-08-04 LAB — COMPREHENSIVE METABOLIC PANEL
ALT: 9 U/L (ref 0–35)
AST: 12 U/L (ref 0–37)
Albumin: 4 g/dL (ref 3.5–5.2)
Alkaline Phosphatase: 86 U/L (ref 39–117)
BUN: 27 mg/dL — ABNORMAL HIGH (ref 6–23)
CO2: 28 mEq/L (ref 19–32)
Calcium: 10 mg/dL (ref 8.4–10.5)
Chloride: 102 mEq/L (ref 96–112)
Creatinine, Ser: 0.7 mg/dL (ref 0.40–1.20)
GFR: 86.96 mL/min (ref >60.00)
Glucose, Bld: 115 mg/dL — ABNORMAL HIGH (ref 70–99)
Potassium: 4.1 mEq/L (ref 3.5–5.1)
Sodium: 140 mEq/L (ref 135–145)
Total Bilirubin: 0.2 mg/dL (ref 0.2–1.2)
Total Protein: 7.8 g/dL (ref 6.0–8.3)

## 2019-08-04 LAB — POC URINALSYSI DIPSTICK (AUTOMATED)
Blood, UA: NEGATIVE
Glucose, UA: NEGATIVE
Leukocytes, UA: NEGATIVE
Nitrite, UA: NEGATIVE
Protein, UA: POSITIVE — AB
Spec Grav, UA: >=1.03 — AB (ref 1.010–1.025)
Urobilinogen, UA: 1 E.U./dL
pH, UA: 6 (ref 5.0–8.0)

## 2019-08-04 LAB — CBC WITH DIFFERENTIAL/PLATELET
Basophils Absolute: 0 10*3/uL (ref 0.0–0.1)
Basophils Relative: 0.6 % (ref 0.0–3.0)
Eosinophils Absolute: 0.1 10*3/uL (ref 0.0–0.7)
Eosinophils Relative: 1.8 % (ref 0.0–5.0)
HCT: 47.2 % — ABNORMAL HIGH (ref 36.0–46.0)
Hemoglobin: 16.1 g/dL — ABNORMAL HIGH (ref 12.0–15.0)
Lymphocytes Relative: 16.9 % (ref 12.0–46.0)
Lymphs Abs: 1.2 10*3/uL (ref 0.7–4.0)
MCHC: 34.1 g/dL (ref 30.0–36.0)
MCV: 108.3 fl — ABNORMAL HIGH (ref 78.0–100.0)
Monocytes Absolute: 0.5 10*3/uL (ref 0.1–1.0)
Monocytes Relative: 7.4 % (ref 3.0–12.0)
Neutro Abs: 5.4 10*3/uL (ref 1.4–7.7)
Neutrophils Relative %: 73.3 % (ref 43.0–77.0)
Platelets: 294 10*3/uL (ref 150.0–400.0)
RBC: 4.36 Mil/uL (ref 3.87–5.11)
RDW: 13.7 % (ref 11.5–15.5)
WBC: 7.4 10*3/uL (ref 4.0–10.5)

## 2019-08-04 LAB — LIPASE: Lipase: 37 U/L (ref 11.0–59.0)

## 2019-08-04 MED ORDER — ONDANSETRON HCL 4 MG PO TABS: 4.0000 mg | ORAL_TABLET | Freq: Three times a day (TID) | ORAL | 0 refills | Status: DC | PRN

## 2019-08-04 MED ORDER — ESOMEPRAZOLE MAGNESIUM 40 MG PO CPDR: 40.0000 mg | DELAYED_RELEASE_CAPSULE | Freq: Two times a day (BID) | ORAL | 6 refills | Status: DC

## 2019-08-04 NOTE — Assessment & Plan Note (Signed)
Chronic epigastric abd pain radiating to R abdomen and to thoracic and lower back associated with n/v in h/o known erosive gastritis and PUD by EGD 05/2018. S/p gastric bypass remotely. Has been off nexium for months. If GI f/u needed, requests different GI doc.  UA today not consistent with infection or nephrolithiasis (in h/o same) but concentrated - advised increase water intake.  Check further labwork to help r/o pancreatitis, choledocholithiasis, diverticulitis, or other intra abdominal process.  Discussed tylenol use - currently overusing.  Treat with restarting nexium 40mg  BID and zofran PRN nausea. Update if not improving with this.  Encouraged return to PCP for preventative care as overdue.  Pt agrees with plan.

## 2019-08-04 NOTE — Patient Instructions (Addendum)
Urine was concentrated today - increase water intake.  labwork today I think this is from untreated gastritis. Restart nexium 40mg  twice daily - sent to pharmacy.  Zofran for nausea as needed.  Let us know if not improving with above.  I would suggest schedule physical with PCP

## 2019-08-04 NOTE — Progress Notes (Signed)
This visit was conducted in person.  BP 140/88 (BP Location: Left Arm, Patient Position: Sitting, Cuff Size: Normal)   Pulse 100   Temp 97.7 F (36.5 C) (Temporal)   Ht 5' (1.524 m)   Wt 133 lb 2 oz (60.4 kg)   SpO2 97%   BMI 26.00 kg/m    CC: abd pain Subjective:    Patient ID: Brenda Hawkins, female    DOB: December 30, 1965, 53 y.o.   MRN: [id number]  HPI: Brenda Hawkins is a 53 y.o. female presenting on 08/04/2019 for Abdominal Pain (C/o epigastric region and RUQ abd pain. Also, c/o nausea and vomiting. Has had gallbladder removed and h/o kidney stones.  Sxs started about 9 mo ago. )   Last saw PCP 2016.   See recent phone note for details.  9 mo h/o epigastric and R sided abdominal pain associated with band like lower back pain as well as midline mid thoracic back pain, nausea/vomiting. NBNB emesis. Treating pain with tylenol 1500mg  several times a day. No appetite. Symptoms feel like prior bleeding ulcer. + GERD symptoms at night time after a bad day with pain. Occasional RLQ discomfort.   No fevers/chills, bowel changes, blood in stool, dysuria, frequency, urgency. No vaginal bleeding or discharge.  LMP at age 53yo - s/p early menopause.  Current 1 ppd smoker. Precontemplative.  Avoids NSAIDs No alcohol. 1-2 cups caffeine in am.   S/p roux en y gastric bypass 2003. H/o iron and b12 deficiency followed by Dr Merlene Pullingorcoran.   Diagnosed with bleeding ulcer 05/2018 by EGD s/p cauterization (Vanga) treated with nexium 40mg  daily. She has been off nexium since ~10/2018 - states this was never refilled. Per GI note 06/2018 recommendation was to take nexium 40mg  BID x at least 6 months. Current pain feels similar.   She was diagnosed with kidney stones this spring 2020 (s/p lithotripsy x2 and ureteral stents x4 by Dr Alvester MorinBell). Current pain feels different.   She was diagnosed with gallstones s/p cholecystectomy 02/2019.   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 MIL passed away 06/2019.   Has been on pain meds for almost 3 months for her abdominal pain.      Relevant past medical, surgical, family and social history reviewed and updated as indicated. Interim medical history since our last visit reviewed. Allergies and medications reviewed and updated. No outpatient medications prior to visit.   Facility-Administered Medications Prior to Visit  Medication Dose Route Frequency Provider Last Rate Last Dose  . sodium chloride 0.9 % injection 10 mL  10 mL Intracatheter PRN Jeralyn RuthsFinnegan, Timothy J, MD         Per HPI unless specifically indicated in ROS section below Review of Systems Objective:    BP 140/88 (BP Location: Left Arm, Patient Position: Sitting, Cuff Size: Normal)   Pulse 100   Temp 97.7 F (36.5 C) (Temporal)   Ht 5' (1.524 m)   Wt 133 lb 2 oz (60.4 kg)   SpO2 97%   BMI 26.00 kg/m   Wt Readings from Last 3 Encounters:  08/04/19 133 lb 2 oz (60.4 kg)  06/30/19 129 lb 11.9 oz (58.8 kg)  03/08/19 129 lb 4.8 oz (58.7 kg)    Physical Exam Vitals signs and nursing note reviewed.  Constitutional:      General: She is not in acute distress.    Appearance: Normal appearance. She is not ill-appearing.  HENT:  Mouth/Throat:     Mouth: Mucous membranes are moist.     Pharynx: Oropharynx is clear. No posterior oropharyngeal erythema.  Cardiovascular:     Rate and Rhythm: Normal rate and regular rhythm.     Pulses: Normal pulses.     Heart sounds: Normal heart sounds. No murmur.  Pulmonary:     Effort: Pulmonary effort is normal. No respiratory distress.     Breath sounds: Normal breath sounds. No wheezing, rhonchi or rales.  Abdominal:     General: Abdomen is flat. Bowel sounds are normal. There is no distension.     Palpations: Abdomen is soft. There is no mass.     Tenderness: There is abdominal tenderness (mild-mod) in the right lower quadrant and epigastric area. There is no right CVA tenderness, left CVA  tenderness, guarding or rebound. Negative signs include Murphy's sign.     Hernia: No hernia is present.  Musculoskeletal:     Right lower leg: No edema.     Left lower leg: No edema.  Skin:    General: Skin is warm and dry.     Findings: No rash.  Neurological:     Mental Status: She is alert.  Psychiatric:        Mood and Affect: Mood normal.        Behavior: Behavior normal.       Results for orders placed or performed in visit on 08/04/19  POCT Urinalysis Dipstick (Automated)  Result Value Ref Range   Color, UA yellow    Clarity, UA clear    Glucose, UA Negative Negative   Bilirubin, UA 2+    Ketones, UA +/-    Spec Grav, UA >=1.030 (A) 1.010 - 1.025   Blood, UA negative    pH, UA 6.0 5.0 - 8.0   Protein, UA Positive (A) Negative   Urobilinogen, UA 1.0 0.2 or 1.0 E.U./dL   Nitrite, UA negative    Leukocytes, UA Negative Negative   Lab Results  Component Value Date   HGBA1C 6.6 (H) 12/09/2014    Assessment & Plan:  This visit occurred during the SARS-CoV-2 public health emergency.  Safety protocols were in place, including screening questions prior to the visit, additional usage of staff PPE, and extensive cleaning of exam room while observing appropriate contact time as indicated for disinfecting solutions.   Problem List Items Addressed This Visit    PUD (peptic ulcer disease)   Epigastric pain - Primary    Chronic epigastric abd pain radiating to R abdomen and to thoracic and lower back associated with n/v in h/o known erosive gastritis and PUD by EGD 05/2018. S/p gastric bypass remotely. Has been off nexium for months. If GI f/u needed, requests different GI doc.  UA today not consistent with infection or nephrolithiasis (in h/o same) but concentrated - advised increase water intake.  Check further labwork to help r/o pancreatitis, choledocholithiasis, diverticulitis, or other intra abdominal process.  Discussed tylenol use - currently overusing.  Treat with  restarting nexium 40mg  BID and zofran PRN nausea. Update if not improving with this.  Encouraged return to PCP for preventative care as overdue.  Pt agrees with plan.       Relevant Orders   POCT Urinalysis Dipstick (Automated) (Completed)   Comprehensive metabolic panel   CBC with Differential   Lipase   Chronic erosive gastritis       Meds ordered this encounter  Medications  . esomeprazole (NEXIUM) 40 MG capsule  Sig: Take 1 capsule (40 mg total) by mouth 2 (two) times daily before a meal.    Dispense:  60 capsule    Refill:  6  . ondansetron (ZOFRAN) 4 MG tablet    Sig: Take 1 tablet (4 mg total) by mouth every 8 (eight) hours as needed for nausea or vomiting.    Dispense:  20 tablet    Refill:  0   Orders Placed This Encounter  Procedures  . Comprehensive metabolic panel  . CBC with Differential  . Lipase  . POCT Urinalysis Dipstick (Automated)    Patient Instructions  Urine was concentrated today - increase water intake.  labwork today I think this is from untreated gastritis. Restart nexium 40mg  twice daily - sent to pharmacy.  Zofran for nausea as needed.  Let know if not improving with above.  I would suggest schedule physical with PCP    Follow up plan: Return if symptoms worsen or fail to improve.  [place], MD

## 2019-08-12 ENCOUNTER — Ambulatory Visit: Payer: 59 | Admitting: Family Medicine

## 2019-08-16 LAB — HM DIABETES FOOT EXAM

## 2019-08-20 ENCOUNTER — Other Ambulatory Visit: Payer: Self-pay

## 2019-08-23 ENCOUNTER — Other Ambulatory Visit: Payer: Self-pay

## 2019-08-23 ENCOUNTER — Inpatient Hospital Stay: Payer: 59 | Attending: Hematology and Oncology

## 2019-08-23 VITALS — BP 177/114 | HR 78

## 2019-08-23 DIAGNOSIS — E538 Deficiency of other specified B group vitamins: Secondary | ICD-10-CM | POA: Diagnosis present

## 2019-08-23 DIAGNOSIS — D508 Other iron deficiency anemias: Secondary | ICD-10-CM

## 2019-08-23 DIAGNOSIS — D509 Iron deficiency anemia, unspecified: Secondary | ICD-10-CM

## 2019-08-23 MED ORDER — CYANOCOBALAMIN 1000 MCG/ML IJ SOLN
1000.0000 ug | Freq: Once | INTRAMUSCULAR | Status: AC
  Administered 2019-08-23: 1000 ug via INTRAMUSCULAR

## 2019-08-23 NOTE — Patient Instructions (Signed)

## 2019-09-09 ENCOUNTER — Ambulatory Visit (INDEPENDENT_AMBULATORY_CARE_PROVIDER_SITE_OTHER): Payer: 59 | Admitting: Family Medicine

## 2019-09-09 ENCOUNTER — Encounter: Payer: Self-pay | Admitting: Family Medicine

## 2019-09-09 ENCOUNTER — Other Ambulatory Visit: Payer: Self-pay

## 2019-09-09 VITALS — BP 160/90 | HR 77 | Temp 99.2°F | Ht 60.5 in | Wt 139.5 lb

## 2019-09-09 DIAGNOSIS — Z0000 Encounter for general adult medical examination without abnormal findings: Secondary | ICD-10-CM | POA: Diagnosis not present

## 2019-09-09 DIAGNOSIS — Z1211 Encounter for screening for malignant neoplasm of colon: Secondary | ICD-10-CM

## 2019-09-09 DIAGNOSIS — D751 Secondary polycythemia: Secondary | ICD-10-CM

## 2019-09-09 DIAGNOSIS — E119 Type 2 diabetes mellitus without complications: Secondary | ICD-10-CM | POA: Diagnosis not present

## 2019-09-09 DIAGNOSIS — I10 Essential (primary) hypertension: Secondary | ICD-10-CM | POA: Diagnosis not present

## 2019-09-09 DIAGNOSIS — E7800 Pure hypercholesterolemia, unspecified: Secondary | ICD-10-CM

## 2019-09-09 DIAGNOSIS — E538 Deficiency of other specified B group vitamins: Secondary | ICD-10-CM

## 2019-09-09 DIAGNOSIS — K9589 Other complications of other bariatric procedure: Secondary | ICD-10-CM

## 2019-09-09 DIAGNOSIS — D509 Iron deficiency anemia, unspecified: Secondary | ICD-10-CM

## 2019-09-09 DIAGNOSIS — Z720 Tobacco use: Secondary | ICD-10-CM

## 2019-09-09 LAB — COMPREHENSIVE METABOLIC PANEL
ALT: 16 U/L (ref 0–35)
AST: 17 U/L (ref 0–37)
Albumin: 4.2 g/dL (ref 3.5–5.2)
Alkaline Phosphatase: 79 U/L (ref 39–117)
BUN: 22 mg/dL (ref 6–23)
CO2: 24 mEq/L (ref 19–32)
Calcium: 9.4 mg/dL (ref 8.4–10.5)
Chloride: 106 mEq/L (ref 96–112)
Creatinine, Ser: 0.68 mg/dL (ref 0.40–1.20)
GFR: 89.89 mL/min (ref >60.00)
Glucose, Bld: 87 mg/dL (ref 70–99)
Potassium: 4.1 mEq/L (ref 3.5–5.1)
Sodium: 140 mEq/L (ref 135–145)
Total Bilirubin: 0.2 mg/dL (ref 0.2–1.2)
Total Protein: 7 g/dL (ref 6.0–8.3)

## 2019-09-09 LAB — LDL CHOLESTEROL, DIRECT: Direct LDL: 160 mg/dL

## 2019-09-09 LAB — LIPID PANEL
Cholesterol: 243 mg/dL — ABNORMAL HIGH (ref 0–200)
HDL: 42.4 mg/dL (ref >39.00)
NonHDL: 200.6
Total CHOL/HDL Ratio: 6
Triglycerides: 203 mg/dL — ABNORMAL HIGH (ref 0.0–149.0)
VLDL: 40.6 mg/dL — ABNORMAL HIGH (ref 0.0–40.0)

## 2019-09-09 LAB — MICROALBUMIN / CREATININE URINE RATIO
Creatinine,U: 86.8 mg/dL
Microalb Creat Ratio: 4.8 mg/g (ref 0.0–30.0)
Microalb, Ur: 4.2 mg/dL — ABNORMAL HIGH (ref 0.0–1.9)

## 2019-09-09 LAB — TSH: TSH: 3.03 u[IU]/mL (ref 0.35–4.50)

## 2019-09-09 LAB — HEMOGLOBIN A1C: Hgb A1c MFr Bld: 6.6 % — ABNORMAL HIGH (ref 4.6–6.5)

## 2019-09-09 NOTE — Assessment & Plan Note (Signed)
Due for re-eval. Statin indicated given diabetes, pt will consider.

## 2019-09-09 NOTE — Assessment & Plan Note (Signed)
Counseled on smoking cessation. Options discussed. She will start nictoine transdermal for replacement.

## 2019-09-09 NOTE — Progress Notes (Signed)
Chief Complaint  Patient presents with  . Annual Exam    History of Present Illness: HPI   The patient is here for annual wellness exam and preventative care.    Last seen by myself  In 2016  Diagnosed with bleeding ulcer 05/2018 by EGD s/p cauterization (Vanga) treated with nexium 40mg  daily. She had been off nexium since ~10/2018 - states this was never refilled. Per GI note 06/2018 recommendation was to take nexium 40mg  BID x at least 6 months.   Gastritis, PUD: treated in 07/2019 with restarting nexium by Dr. Darnell Level.  Symptom have nbow resolved.  She was diagnosed with kidney stones this spring 2020 (s/p lithotripsy x2 and ureteral stents x4 by Dr Gloriann Loan).   She was diagnosed with gallstones s/p cholecystectomy 02/2019.    S/p roux en y gastric bypass 2003. H/o iron and b12 deficiency followed by Dr Mike Gip.  Body mass index is 26.8 kg/m.  Wt Readings from Last 3 Encounters:  09/09/19 139 lb 8 oz (63.3 kg)  08/04/19 133 lb 2 oz (60.4 kg)  06/30/19 129 lb 11.9 oz (58.8 kg)   Diabetes:  Due for re-eval Using medications without difficulties: Hypoglycemic episodes: Hyperglycemic episodes: Feet problems: no ulcers Blood Sugars averaging: not checking eye exam within last year: due  Hypertension:   She is not currently on any medication for this.Marland Kitchen in past year BP has been higher. Under more stress. BP Readings from Last 3 Encounters:  09/09/19 (!) 160/90  08/23/19 (!) 177/114  08/04/19 140/88  Using medication without problems or lightheadedness: none Chest pain with exertion: Edema:none Short of breath:none Average home BPs:  170s/80 Other issues:  Elevated Cholesterol: Due for re-eval Using medications without problems: Muscle aches:  Diet compliance: Exercise: Other complaints:   No snoring, feels well rested, but  Hg up and BP up... Dr. Patsy Baltimore wants [place] to consider sleep apnea test.     This visit occurred during the SARS-CoV-2 public health emergency.  Safety  protocols were in place, including screening questions prior to the visit, additional usage of staff PPE, and extensive cleaning of exam room while observing appropriate contact time as indicated for disinfecting solutions.   COVID 19 screen:  No recent travel or known exposure to COVID19 The patient denies respiratory symptoms of COVID 19 at this time. The importance of social distancing was discussed today.     Review of Systems  Constitutional: Negative for chills and fever.  HENT: Negative for congestion and ear pain.   Eyes: Negative for pain and redness.  Respiratory: Negative for cough and shortness of breath.   Cardiovascular: Negative for chest pain, palpitations and leg swelling.  Gastrointestinal: Negative for abdominal pain, blood in stool, constipation, diarrhea, nausea and vomiting.  Genitourinary: Negative for dysuria.  Musculoskeletal: Negative for falls and myalgias.  Skin: Negative for rash.  Neurological: Negative for dizziness.  Psychiatric/Behavioral: Negative for depression. The patient is not nervous/anxious.       Past Medical History:  Diagnosis Date  . Anemia    in past  . Anxiety    no longer  . Asthma    seasonal  . Diabetes mellitus type II    no longer  . GERD (gastroesophageal reflux disease)    no longer  . Obesity   . Osteopenia   . Sleep apnea    no longer  . Symptomatic cholelithiasis 02/05/2019  . 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 Vitamin B12 deficiency    since bariatric surgery  reports that she has been smoking. She has been smoking about 1.00 pack per day. She has never used smokeless tobacco. She reports that she does not drink alcohol or use drugs.   Current Outpatient Medications:  .  esomeprazole (NEXIUM) 40 MG capsule, Take 1 capsule (40 mg total) by mouth 2 (two) times daily before a meal., Disp: 60 capsule, Rfl: 6 No current facility-administered medications for this visit.  Facility-Administered Medications Ordered in Other Visits:   .  sodium chloride 0.9 % injection 10 mL, 10 mL, Intracatheter, PRN, Orlie Dakin, Tollie Pizza, MD   Observations/Objective: Blood pressure (!) 160/90, pulse 77, temperature 99.2 F (37.3 C), temperature source Temporal, height 5' 0.5" (1.537 m), weight 139 lb 8 oz (63.3 kg), SpO2 97 %.  Physical Exam Constitutional:      General: She is not in acute distress.    Appearance: Normal appearance. She is well-developed. She is not ill-appearing or toxic-appearing.     Comments: Central obesity  HENT:     Head: Normocephalic.     Right Ear: Hearing, tympanic membrane, ear canal and external ear normal.     Left Ear: Hearing, tympanic membrane, ear canal and external ear normal.     Nose: Nose normal.  Eyes:     General: Lids are normal. Lids are everted, no foreign bodies appreciated.     Conjunctiva/sclera: Conjunctivae normal.     Pupils: Pupils are equal, round, and reactive to light.  Neck:     Thyroid: No thyroid mass or thyromegaly.     Vascular: No carotid bruit.     Trachea: Trachea normal.  Cardiovascular:     Rate and Rhythm: Normal rate and regular rhythm.     Heart sounds: Normal heart sounds, S1 normal and S2 normal. No murmur. No gallop.   Pulmonary:     Effort: Pulmonary effort is normal. No respiratory distress.     Breath sounds: Normal breath sounds. No wheezing, rhonchi or rales.  Abdominal:     General: Bowel sounds are normal. There is no distension or abdominal bruit.     Palpations: Abdomen is soft. There is no fluid wave or mass.     Tenderness: There is no abdominal tenderness. There is no guarding or rebound.     Hernia: No hernia is present.  Musculoskeletal:     Cervical back: Normal range of motion and neck supple.  Lymphadenopathy:     Cervical: No cervical adenopathy.  Skin:    General: Skin is warm and dry.     Findings: No rash.  Neurological:     Mental Status: She is alert.     Cranial Nerves: No cranial nerve deficit.     Sensory: No sensory  deficit.  Psychiatric:        Mood and Affect: Mood is not anxious or depressed.        Speech: Speech normal.        Behavior: Behavior normal. Behavior is cooperative.        Judgment: Judgment normal.      Diabetic foot exam: Normal inspection No skin breakdown No calluses  Normal DP pulses Normal sensation to light touch and monofilament Nails normal  Assessment and Plan  The patient's preventative maintenance and recommended screening tests for an annual wellness exam were reviewed in full today. Brought up to date unless services declined.  Counselled on the importance of diet, exercise, and its role in overall health and mortality. The patient's FH and SH was  reviewed, including their home life, tobacco status, and drug and alcohol status.   Vaccines: due for pnuemonia vaccine, flu and tetanus.. refused despite need, voiced understanding of risk of not performing prevention. Pap/DVE: Due since 2018.. refused despite need, voiced understanding of risk of not performing prevention. Mammo:   Due.. info provided.. will set up Colon:   Complete ifob Smoking Status: contemplative for smoking, 2 packs per day ETOH/ drug use:  HIV screen:   neg  Diabetes mellitus type II, controlled  Due for re-eval. Encouraged exercise, weight loss, healthy eating habits.   B12 deficiency  Follwoed by hemeotology  Iron deficiency anemia following bariatric surgery  Resolved. Now Hg high.. likely due ot smoking, but may need sleep study.  Tobacco abuse disorder Counseled on smoking cessation. Options discussed. She will start nictoine transdermal for replacement.  Essential hypertension, benign Eval with labs.  Quit smoking.  If remaining high will need to start ACEI/ARB.  HYPERCHOLESTEROLEMIA  Due for re-eval. Statin indicated given diabetes, pt will consider.    Kerby NoraAmy Lavaris Sexson, MD

## 2019-09-09 NOTE — Assessment & Plan Note (Signed)
Follwoed by hemeotology

## 2019-09-09 NOTE — Assessment & Plan Note (Signed)
Due for re-eval. Encouraged exercise, weight loss, healthy eating habits.  

## 2019-09-09 NOTE — Patient Instructions (Addendum)
Please stop at the lab to have labs drawn.  Set up yearly eye exam.  Quit smoking.  Go to pharmacy start on 4-6 weeks of 21 mg/ day nicotine patch.  Follow BP at home...  Goal BP < 140/90. Call to set up mammogram on your own.   Complete stool test for colon cancer.

## 2019-09-09 NOTE — Assessment & Plan Note (Signed)
Eval with labs.  Quit smoking.  If remaining high will need to start ACEI/ARB.

## 2019-09-09 NOTE — Assessment & Plan Note (Signed)
Resolved. Now Hg high.. likely due ot smoking, but may need sleep study.

## 2019-09-16 ENCOUNTER — Other Ambulatory Visit: Payer: Self-pay

## 2019-09-17 ENCOUNTER — Inpatient Hospital Stay: Payer: 59

## 2019-09-17 ENCOUNTER — Inpatient Hospital Stay: Payer: 59 | Attending: Hematology and Oncology

## 2019-09-17 DIAGNOSIS — D508 Other iron deficiency anemias: Secondary | ICD-10-CM

## 2019-09-17 DIAGNOSIS — D509 Iron deficiency anemia, unspecified: Secondary | ICD-10-CM

## 2019-09-17 DIAGNOSIS — E538 Deficiency of other specified B group vitamins: Secondary | ICD-10-CM | POA: Diagnosis present

## 2019-09-17 LAB — CBC WITH DIFFERENTIAL/PLATELET
Abs Immature Granulocytes: 0.08 10*3/uL — ABNORMAL HIGH (ref 0.00–0.07)
Basophils Absolute: 0.1 10*3/uL (ref 0.0–0.1)
Basophils Relative: 1 %
Eosinophils Absolute: 0.2 10*3/uL (ref 0.0–0.5)
Eosinophils Relative: 3 %
HCT: 47.5 % — ABNORMAL HIGH (ref 36.0–46.0)
Hemoglobin: 16 g/dL — ABNORMAL HIGH (ref 12.0–15.0)
Immature Granulocytes: 1 %
Lymphocytes Relative: 24 %
Lymphs Abs: 1.5 10*3/uL (ref 0.7–4.0)
MCH: 36.2 pg — ABNORMAL HIGH (ref 26.0–34.0)
MCHC: 33.7 g/dL (ref 30.0–36.0)
MCV: 107.5 fL — ABNORMAL HIGH (ref 80.0–100.0)
Monocytes Absolute: 0.5 10*3/uL (ref 0.1–1.0)
Monocytes Relative: 7 %
Neutro Abs: 4 10*3/uL (ref 1.7–7.7)
Neutrophils Relative %: 64 %
Platelets: 267 10*3/uL (ref 150–400)
RBC: 4.42 MIL/uL (ref 3.87–5.11)
RDW: 13.1 % (ref 11.5–15.5)
WBC: 6.3 10*3/uL (ref 4.0–10.5)
nRBC: 0 % (ref 0.0–0.2)

## 2019-09-17 LAB — FERRITIN: Ferritin: 13 ng/mL (ref 11–307)

## 2019-09-17 MED ORDER — CYANOCOBALAMIN 1000 MCG/ML IJ SOLN
1000.0000 ug | Freq: Once | INTRAMUSCULAR | Status: AC
  Administered 2019-09-17: 1000 ug via INTRAMUSCULAR

## 2019-09-17 NOTE — Progress Notes (Signed)
Confirmed Name and DOB. Denies any concerns.  

## 2019-09-18 NOTE — Progress Notes (Signed)
[facility]  [address] [place], [place] [address] Phone: [phone]  Fax: [phone]   Telemedicine Office Visit:  10/10/2019  Referring physician: Excell Seltzer, MD  I connected with Brenda Hawkins on 10/10/19 at 2:25 PM EDT by videoconferenceing and verified that I was speaking with the correct person using 2 identifiers.  The patient was at home.  I discussed the limitations, risk, security and privacy concerns of performing an evaluation and management service by videoconferencing and the availability of in person appointments.  I also discussed with the patient that there may be a patient responsible charge related to this service.  The patient expressed understanding and agreed to proceed.   Chief Complaint: Brenda Hawkins is a 54 y.o. female with s/p gastric bypass surgery with iron deficiency anemia and B12 deficiency who is seen for 4 month assessment   HPI: The patient was last seen in the hematology clinic on 07/01/2019. At that time, she had been under a lot of stress.  She would like to stop smoking.  Exam was stable. Hematocrit 47.7, hemoglobin 16.5, MCV 105.8, platelets 235,000, WBC 7100, ANC 4800. Ferritin was 14.  She was seen for RUQ abdominal pain by Dr. Sharen Hones on 08/04/2019. She had nausea and vomiting.  She was given Nexium and Zofran .  She received B12 injections monthly (07/26/2019 - 09/17/2019).  CBC followed  08/04/2019: Hematocrit 47.2, hemoglobin 16.1, MCV 108.3, platelets 294,000, WBC 7400, ANC 4800.  09/17/2019: Hematocrit 47.5, hemoglobin 16.0, MCV 107.5, platelets 267,000, WBC 6300, ANC 4000. Ferritin 13.   During the interim, she has done well.  She states that she is taking steps to stop smoking.  She saw Dr. Ermalene Searing last week.  She plans to cut her smoking down to a 1/2 pack a day then to stop.  He is now smoking 1-1/2 packs a day (previously 2-1/2 to 3 packs a day).  She states that her ulcer was  acting up on 08/04/2019.  Symptoms are now better.  Nexium is helping.  He denies any nausea, vomiting or diarrhea.  She denies any melena or hematochezia.   Past Medical History:  Diagnosis Date  . Anemia    in past  . Anxiety    no longer  . Asthma    seasonal  . Diabetes mellitus type II    no longer  . GERD (gastroesophageal reflux disease)    no longer  . Obesity   . Osteopenia   . Sleep apnea    no longer  . Symptomatic cholelithiasis 02/05/2019  . 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 Vitamin B12 deficiency    since bariatric surgery    Past Surgical History:  Procedure Laterality Date  . CHOLECYSTECTOMY N/A 02/17/2019   Procedure: LAPAROSCOPIC CHOLECYSTECTOMY - DIABETIC;  Surgeon: Ancil Linsey, MD;  Location: [facility] ORS;  Service: General;  Laterality: N/A;  . CYSTOSCOPY W/ URETERAL STENT PLACEMENT Left 11/11/2018   Procedure: CYSTOSCOPY WITH STENT REMOVAL;  Surgeon: Riki Altes, MD;  Location: [facility] ORS;  Service: Urology;  Laterality: Left;  . CYSTOSCOPY WITH HOLMIUM LASER LITHOTRIPSY Left 10/24/2018   Procedure: CYSTOSCOPY WITH HOLMIUM LASER LITHOTRIPSy;  Surgeon: Crista Elliot, MD;  Location: [facility] OR[facility];  Service: Urology;  Laterality: Left;  . CYSTOSCOPY WITH STENT PLACEMENT Bilateral 10/24/2018   Procedure: CYSTOSCOPY WITH STENT PLACEMENT;  Surgeon: Crista Elliot, MD;  Location: [facility];  Service: Urology;  Laterality: Bilateral;  . CYSTOSCOPY/URETEROSCOPY/HOLMIUM LASER/STENT PLACEMENT  Right 11/11/2018   Procedure: CYSTOSCOPY/URETEROSCOPY/HOLMIUM LASER/STENT EXCHANGE;  Surgeon: Riki Altes, MD;  Location: [facility];  Service: Urology;  Laterality: Right;  . ESOPHAGOGASTRODUODENOSCOPY (EGD) WITH PROPOFOL N/A 06/01/2018   Procedure: ESOPHAGOGASTRODUODENOSCOPY (EGD) WITH PROPOFOL;  Surgeon: Toney Reil, MD;  Location: [facility] ENDOSCOPY;  Service: Gastroenterology;  Laterality: N/A;  . GALLBLADDER SURGERY  02/17/2019  . 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 REDUCTION MAMMAPLASTY  Right 1992  . URETEROSCOPY Left 10/24/2018   Procedure: URETEROSCOPY;  Surgeon: Crista Elliot, MD;  Location: [facility] OR[facility];  Service: Urology;  Laterality: Left;    Family History  Problem Relation Age of Onset  . Heart disease Mother        valve replacement surgery  . Coronary artery disease Father   . Breast cancer Neg Hx     Social History:  reports that she has been smoking. She has been smoking about 1.00 pack per day. She has never used smokeless tobacco. She reports that she does not drink alcohol or use drugs. She quit smoking >20 years ago, but started smoking again 3-4 years ago.Smoking currently a little over a pack a day. She would like to try and quit smoking this year (2020).She has a dog named Doctor, general practice.The patient lives in [place].Herhusband's name isCraig.  She has a 24-month-old old Shitzu puppy named Flossie.  The patient is alone today.  Participants in the patient's visit and their role in the encounter included the patient and Delano Metz, RN, today.  The intake visit was provided by Delano Metz, RN.    Allergies:  Allergies  Allergen Reactions  . Codeine Sulfate Other (See Comments)    Reaction: "violently ill"  . Penicillins Other (See Comments)    Did it involve swelling of the face/tongue/throat, SOB, or low BP? Unknown Did it involve sudden or severe rash/hives, skin peeling, or any reaction on the inside of your mouth or nose? Unknown Did you need to seek medical attention at a hospital or doctor's office? Unknown When did it last happen?Childhood allergy If all above answers are "NO", may proceed with cephalosporin use.   . Adhesive [Tape] Itching and Rash    PAPER TAPE IS FINE  . Latex Itching and Rash    Current Medications: Current Outpatient Medications  Medication Sig Dispense Refill  . esomeprazole (NEXIUM) 40 MG capsule Take 1 capsule (40 mg total) by mouth 2 (two) times daily before a meal. 60 capsule 6   No  current facility-administered medications for this visit.   Facility-Administered Medications Ordered in Other Visits  Medication Dose Route Frequency Provider Last Rate Last Admin  . sodium chloride 0.9 % injection 10 mL  10 mL Intracatheter PRN Jeralyn Ruths, MD        Review of Systems  Constitutional: Negative for chills, diaphoresis, fever, malaise/fatigue and weight loss (138 pounds the other day).       Feels "good".  HENT: Negative.  Negative for congestion, ear pain, hearing loss, nosebleeds, sinus pain and sore throat.   Eyes: Negative.  Negative for blurred vision and double vision.  Respiratory: Negative.  Negative for cough, shortness of breath and wheezing.        Cutting back on smoking.  Cardiovascular: Negative for chest pain, claudication, leg swelling and PND.       White coat hypertension.  Gastrointestinal: Negative.  Negative for abdominal pain, blood in stool, constipation, diarrhea, melena, nausea and vomiting.       Eating well.  No  abdominal issues.  Genitourinary: Negative.  Negative for dysuria, frequency, hematuria and urgency.       Kidney stones s/p stent, lithotripsy, and stent removal. No hematuria.  Musculoskeletal: Negative.  Negative for back pain, joint pain and myalgias.  Skin: Negative.  Negative for rash.  Neurological: Positive for tingling (fingers and feet). Negative for dizziness, sensory change, focal weakness, weakness and headaches.  Endo/Heme/Allergies: Negative.  Does not bruise/bleed easily.  Psychiatric/Behavioral: Negative for depression and memory loss. The patient is not nervous/anxious and does not have insomnia.   All other systems reviewed and are negative.  Performance status (ECOG):  0  Vitals There were no vitals taken for this visit.   Physical Exam  Constitutional: She is oriented to person, place, and time. She appears well-developed and well-nourished. No distress.  HENT:  Head: Normocephalic and atraumatic.    Short blonde hair.  Eyes: Conjunctivae and EOM are normal. No scleral icterus.  Glasses.  Blue eyes.  Neurological: She is alert and oriented to person, place, and time.  Skin: She is not diaphoretic.  Psychiatric: She has a normal mood and affect. Her behavior is normal. Judgment and thought content normal.  Nursing note reviewed.   No visits with results within 3 Day(s) from this visit.  Latest known visit with results is:  Appointment on 09/17/2019  Component Date Value Ref Range Status  . Ferritin 09/17/2019 13  11 - 307 ng/mL Final   Performed at [facility], [address] [place], [place] [address]  . WBC 09/17/2019 6.3  4.0 - 10.5 K/uL Final  . RBC 09/17/2019 4.42  3.87 - 5.11 MIL/uL Final  . Hemoglobin 09/17/2019 16.0* 12.0 - 15.0 g/dL Final  . HCT 21/30/865701/04/2020 47.5* 36.0 - 46.0 % Final  . MCV 09/17/2019 107.5* 80.0 - 100.0 fL Final  . MCH 09/17/2019 36.2* 26.0 - 34.0 pg Final  . MCHC 09/17/2019 33.7  30.0 - 36.0 g/dL Final  . RDW 84/69/629501/04/2020 13.1  11.5 - 15.5 % Final  . Platelets 09/17/2019 267  150 - 400 K/uL Final  . nRBC 09/17/2019 0.0  0.0 - 0.2 % Final  . Neutrophils Relative % 09/17/2019 64  % Final  . Neutro Abs 09/17/2019 4.0  1.7 - 7.7 K/uL Final  . Lymphocytes Relative 09/17/2019 24  % Final  . Lymphs Abs 09/17/2019 1.5  0.7 - 4.0 K/uL Final  . Monocytes Relative 09/17/2019 7  % Final  . Monocytes Absolute 09/17/2019 0.5  0.1 - 1.0 K/uL Final  . Eosinophils Relative 09/17/2019 3  % Final  . Eosinophils Absolute 09/17/2019 0.2  0.0 - 0.5 K/uL Final  . Basophils Relative 09/17/2019 1  % Final  . Basophils Absolute 09/17/2019 0.1  0.0 - 0.1 K/uL Final  . Immature Granulocytes 09/17/2019 1  % Final  . Abs Immature Granulocytes 09/17/2019 0.08* 0.00 - 0.07 K/uL Final   Performed at [facility], [address] [place], [place] [address]    Assessment:  Brenda Blockeresa Carol Blackson is a 54 y.o. female with status post gastric bypass surgeryin  03/2002 and subsequent development of iron deficiency anemiaand B12 deficiency. She is intolerant of oral iron. She denies any melena or hematochezia. Stool guaiacs were negative on 01/02/2015. Dietis poor. She denies any melena or hematochezia. She has never had a colonoscopy. Patient refuses colonoscopy.  She has B12 deficiency. B12 was 172 on 06/10/2014 and 250 on 12/31/2016. She receives B12 injections (began 01/16/2015; last 09/17/2019). Folate was 12.8 on  11/02/2018.  She has iron deficiency. 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 She received Venoferx4 (01/16/2015 - 02/07/2015), 10/10/2015, 05/06/2017, x 3 (05/08/2017 - 05/22/2017), and x3 (02/09/2018 - 02/23/2018).   Ferritinhas been followed: 1.5 on 09/19/2014, 59 on 02/07/2015, 10 on 03/16/2015, 6 on 06/16/2015, 5 on 10/09/2015, 5 on 01/01/2016, 6 on 04/01/2016, 14 on 07/08/2016, 13 on 08/06/2016, 5 on 05/07/2017, 35 on 08/08/2017, 6 on 11/04/2017, 7 on 02/03/2018, 19 on 05/05/2018, 16 on 08/04/2018, 111 on 11/02/2018, 160 on 11/30/2018, 97 on 12/28/2018, 155 on 03/08/2019, 14 on 06/28/2019, and 13 on 09/17/2019.  EGDon 06/01/2018 revealed patent Roux-en-Y gastrojejunostomy characterized by an intact staple line, visible sutures, congestion, erythema, friable mucosa and ulceration. There was a large clean based gastrojejunal anastomotic ulcer. There was a normal duodenal bulb and second portion of the duodenum. There was bleeding erosive gastropathytreated with bipolar cautery. There was normal gastroesophageal junction and esophagus. Biopsies were negative for malignancy. There was no H pylori. She is on Nexium40 mg BID.  Abdomen and pelvic CTon 10/23/2018 revealed moderate to severe right hydronephrosisdue to 10 mm calculus at the right UPJ. There was moderateleft hydronephrosisdue to 4 mm calculus in distal left ureter and 3 mm calculus at left UVJ. There was tiny bilateral intrarenal calculi. There  was cholelithiasis and colonic diverticulosis. She underwent bilateral stentplacement on 10/24/2018.  She underwent cystoscopy, right ureteroscopy and stone removal, ureteroscopic laser lithotripsy, right ureteral stent placement, left ureteral stent removal, and right retrograde pyelography on 11/11/2018. She underwent right stent removal on 11/17/2018.   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 She denies any liver disease. Thyroid studies have been normal in the past. Labs on 10/30/2017revealed the following normal labs: folate (11.4), TSH (1.563), and reticulocyte count (1.7%). Folate was 6.5 on 08/08/2017. TSH was normal on 09/08/2017 and 05/05/2018.  She has a history of sleep apnea. She previously used CPAP when she was heavier. She is not using CPAP now.   She smokes 1 1/2 packs per day. She has an increasing hematocrit despite low iron stores suggestive of secondary erythrocytosisdue to smoking, sleep apnea, or a myeloproliferative disorder.  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 Work-upon 04/30/2020revealed carbon monoxide11.8% (0-3.6%), erythropoietin 19.3(2.6-18.5). [id number], exons 12-15,CALR, and MPL werenegative.  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 She was taking over 50 BC powders a week for chronic headaches. She had tinnitus. Salicylate level was 54.0 (2.8-30). She was hydrated in the ER on 07/09/2016. Follow-up salicylate level was 98.1.  Bilateral mammogramon 05/29/2017 revealed calcifications and possible asymmetry in the right breast. Left breast was unremarkable. Diagnostic mammogram and ultrasound of the right breast were ordered, but not performed. Patient refuses further breast imaging.  She underwent a cholecystectomyon 02/17/2019.  Symptomatically, she is doing well.  She denies any bleeding.  Diet is good.  She is cutting back on smoking.  Plan: 1.   Review labs from  09/17/2019. 2.Iron deficiency Hematocrit47.5. Hemoglobin 16.0. MCV107.5. Ferritin13.             Continue to monitor without supplementation given borderline polycythemia.   Suspect polycythemia will improve with discontinuation of smoking. 3. B12 deficiency Patientreceived B12 on 09/17/2019. Continue monthly B12 x6. 4. Macrocytic RBC indices Etiology remains unclear. She has no known liver disease or myelodysplastic syndrome(MDS). B12 deficiency has been treated. Folate was 12.8 on 11/02/2018. TSH was normal on 09/09/2019. Consider bone marrow if counts decline to rule out MDS. 5.Erythrocytosis Hemoglobin is 16.0 today. Hemoglobinin the past  has rangedbetween 16.4-17.2. Work-up on 01/07/2019 revealed  Erythropoietin 19.3(2.6-18.5).  JAK2 V617F, exons 12-15,CALR, and MPL werenegative.  Carbon monoxide11.8% (0-3.6%). Etiology is secondary to smoking and sleep apnea.                         Patient actively cutting back on smoking.                         Consider overnight oximetry to rule out sleep apnea. 6. Health maintenance Discuss low-dose chest CT program for which she will be eligible on 11/06/2009 when she turns 54.  Patient is interested.  Contact Glenna Fellows, RN. 7.RTC in 3 months (on day B12 due) for labs (CBC with diff, ferritin) and B12.   8.   RTC in 6 months for MD assessment (on day B12 due), labs (CBC with diff, ferritin -day before), B12 and +/- Venofer.    I discussed the assessment and treatment plan with the patient.  The patient was provided an opportunity to ask questions and all were answered.  The patient agreed with the plan and demonstrated an understanding of the instructions.   The patient was advised to call back if the symptoms worsen or if the condition fails to improve as anticipated.  I provided 21 minutes (1:21 PM - 1:42 PM) of face-to-face time during this this encounter and > 50% was spent counseling as documented under my assessment and plan.    Rosey Bath, MD, PhD    09/20/2019, 1:21 PM

## 2019-09-20 ENCOUNTER — Encounter: Payer: Self-pay | Admitting: Hematology and Oncology

## 2019-09-20 ENCOUNTER — Inpatient Hospital Stay: Payer: 59

## 2019-09-20 ENCOUNTER — Inpatient Hospital Stay (HOSPITAL_BASED_OUTPATIENT_CLINIC_OR_DEPARTMENT_OTHER): Payer: 59 | Admitting: Hematology and Oncology

## 2019-09-20 DIAGNOSIS — D509 Iron deficiency anemia, unspecified: Secondary | ICD-10-CM

## 2019-09-20 DIAGNOSIS — D7589 Other specified diseases of blood and blood-forming organs: Secondary | ICD-10-CM

## 2019-09-20 DIAGNOSIS — E538 Deficiency of other specified B group vitamins: Secondary | ICD-10-CM | POA: Diagnosis not present

## 2019-09-20 DIAGNOSIS — D751 Secondary polycythemia: Secondary | ICD-10-CM

## 2019-09-20 DIAGNOSIS — D508 Other iron deficiency anemias: Secondary | ICD-10-CM

## 2019-09-20 DIAGNOSIS — K9589 Other complications of other bariatric procedure: Secondary | ICD-10-CM

## 2019-10-18 ENCOUNTER — Other Ambulatory Visit: Payer: Self-pay

## 2019-10-18 ENCOUNTER — Inpatient Hospital Stay: Payer: 59 | Attending: Hematology and Oncology

## 2019-10-18 VITALS — BP 170/90 | HR 82 | Temp 99.0°F | Resp 19

## 2019-10-18 DIAGNOSIS — E538 Deficiency of other specified B group vitamins: Secondary | ICD-10-CM | POA: Diagnosis present

## 2019-10-18 DIAGNOSIS — D509 Iron deficiency anemia, unspecified: Secondary | ICD-10-CM

## 2019-10-18 DIAGNOSIS — K9589 Other complications of other bariatric procedure: Secondary | ICD-10-CM

## 2019-10-18 MED ORDER — CYANOCOBALAMIN 1000 MCG/ML IJ SOLN
INTRAMUSCULAR | Status: AC
  Filled 2019-10-18: qty 1

## 2019-10-18 MED ORDER — CYANOCOBALAMIN 1000 MCG/ML IJ SOLN
1000.0000 ug | Freq: Once | INTRAMUSCULAR | Status: AC
  Administered 2019-10-18: 15:00:00 1000 ug via INTRAMUSCULAR

## 2019-11-15 ENCOUNTER — Inpatient Hospital Stay: Payer: 59

## 2019-12-13 ENCOUNTER — Inpatient Hospital Stay: Payer: 59 | Attending: Hematology and Oncology

## 2019-12-13 ENCOUNTER — Inpatient Hospital Stay: Payer: 59

## 2019-12-13 DIAGNOSIS — D509 Iron deficiency anemia, unspecified: Secondary | ICD-10-CM | POA: Diagnosis present

## 2019-12-13 DIAGNOSIS — E538 Deficiency of other specified B group vitamins: Secondary | ICD-10-CM

## 2019-12-13 DIAGNOSIS — D508 Other iron deficiency anemias: Secondary | ICD-10-CM

## 2019-12-13 DIAGNOSIS — D7589 Other specified diseases of blood and blood-forming organs: Secondary | ICD-10-CM

## 2019-12-13 DIAGNOSIS — K9589 Other complications of other bariatric procedure: Secondary | ICD-10-CM

## 2019-12-13 DIAGNOSIS — D751 Secondary polycythemia: Secondary | ICD-10-CM

## 2019-12-13 LAB — CBC WITH DIFFERENTIAL/PLATELET
Abs Immature Granulocytes: 0.05 10*3/uL (ref 0.00–0.07)
Basophils Absolute: 0.1 10*3/uL (ref 0.0–0.1)
Basophils Relative: 1 %
Eosinophils Absolute: 0.2 10*3/uL (ref 0.0–0.5)
Eosinophils Relative: 3 %
HCT: 45.4 % (ref 36.0–46.0)
Hemoglobin: 15.4 g/dL — ABNORMAL HIGH (ref 12.0–15.0)
Immature Granulocytes: 1 %
Lymphocytes Relative: 26 %
Lymphs Abs: 1.5 10*3/uL (ref 0.7–4.0)
MCH: 35.6 pg — ABNORMAL HIGH (ref 26.0–34.0)
MCHC: 33.9 g/dL (ref 30.0–36.0)
MCV: 105.1 fL — ABNORMAL HIGH (ref 80.0–100.0)
Monocytes Absolute: 0.5 10*3/uL (ref 0.1–1.0)
Monocytes Relative: 8 %
Neutro Abs: 3.6 10*3/uL (ref 1.7–7.7)
Neutrophils Relative %: 61 %
Platelets: 240 10*3/uL (ref 150–400)
RBC: 4.32 MIL/uL (ref 3.87–5.11)
RDW: 13.4 % (ref 11.5–15.5)
WBC: 5.9 10*3/uL (ref 4.0–10.5)
nRBC: 0 % (ref 0.0–0.2)

## 2019-12-13 LAB — FERRITIN: Ferritin: 9 ng/mL — ABNORMAL LOW (ref 11–307)

## 2019-12-13 MED ORDER — CYANOCOBALAMIN 1000 MCG/ML IJ SOLN
1000.0000 ug | Freq: Once | INTRAMUSCULAR | Status: AC
  Administered 2019-12-13: 1000 ug via INTRAMUSCULAR

## 2019-12-13 NOTE — Patient Instructions (Signed)

## 2019-12-16 ENCOUNTER — Telehealth: Payer: Self-pay | Admitting: *Deleted

## 2019-12-16 ENCOUNTER — Telehealth: Payer: Self-pay

## 2019-12-16 NOTE — Telephone Encounter (Signed)
Received referral for low dose lung cancer screening CT scan. Message left at phone number listed in EMR for patient to call me back to facilitate scheduling scan.  

## 2019-12-16 NOTE — Telephone Encounter (Signed)
Spoke with the patient to inform her that per Dr Merlene Pulling she is not anemia and her ferritin levels are dectreased to 9. The patient was understanding.

## 2019-12-17 ENCOUNTER — Encounter: Payer: Self-pay | Admitting: *Deleted

## 2019-12-17 ENCOUNTER — Telehealth: Payer: Self-pay | Admitting: *Deleted

## 2019-12-17 DIAGNOSIS — Z87891 Personal history of nicotine dependence: Secondary | ICD-10-CM

## 2019-12-17 NOTE — Telephone Encounter (Signed)
Received referral for initial lung cancer screening scan. Contacted patient and obtained smoking history,(current, 55.5. pack year) as well as answering questions related to screening process. Patient denies signs of lung cancer such as weight loss or hemoptysis. Patient denies comorbidity that would prevent curative treatment if lung cancer were found. Patient is scheduled for shared decision making visit and CT scan on 12/29/19 at 10am.

## 2019-12-29 ENCOUNTER — Inpatient Hospital Stay: Payer: 59 | Admitting: Hospice and Palliative Medicine

## 2019-12-29 ENCOUNTER — Ambulatory Visit: Payer: 59

## 2020-01-04 ENCOUNTER — Other Ambulatory Visit: Payer: Self-pay

## 2020-01-04 ENCOUNTER — Ambulatory Visit
Admission: RE | Admit: 2020-01-04 | Discharge: 2020-01-04 | Disposition: A | Discharge status: Home / Self Care | Payer: 59 | Source: Ambulatory Visit | Attending: Oncology | Admitting: Oncology

## 2020-01-04 ENCOUNTER — Inpatient Hospital Stay (HOSPITAL_BASED_OUTPATIENT_CLINIC_OR_DEPARTMENT_OTHER): Payer: 59 | Admitting: Hospice and Palliative Medicine

## 2020-01-04 DIAGNOSIS — Z87891 Personal history of nicotine dependence: Secondary | ICD-10-CM

## 2020-01-04 DIAGNOSIS — F17210 Nicotine dependence, cigarettes, uncomplicated: Secondary | ICD-10-CM | POA: Diagnosis not present

## 2020-01-04 DIAGNOSIS — Z720 Tobacco use: Secondary | ICD-10-CM

## 2020-01-04 NOTE — Progress Notes (Signed)
In accordance with CMS guidelines, patient has met eligibility criteria including age, absence of signs or symptoms of lung cancer.  Social History   Tobacco Use  . Smoking status: Current Every Day Smoker    Packs/day: 1.50    Years: 37.00    Pack years: 55.50  . Smokeless tobacco: Never Used  Substance Use Topics  . Alcohol use: No  . Drug use: No      A shared decision-making session was conducted prior to the performance of CT scan. This includes one or more decision aids, includes benefits and harms of screening, follow-up diagnostic testing, over-diagnosis, false positive rate, and total radiation exposure.   Counseling on the importance of adherence to annual lung cancer LDCT screening, impact of co-morbidities, and ability or willingness to undergo diagnosis and treatment is imperative for compliance of the program.   Counseling on the importance of continued smoking cessation for former smokers; the importance of smoking cessation for current smokers, and information about tobacco cessation interventions have been given to patient including [place] and [place] quit [place] programs.   Written order for lung cancer screening with LDCT has been given to the patient and any and all questions have been answered to the best of my abilities.    Yearly follow up will be coordinated by Burgess Estelle, Thoracic Navigator.  Time Total: 15 minutes  Visit consisted of counseling and education dealing with complex health screening. Greater than 50%  of this time was spent counseling and coordinating care related to the above assessment and plan.  Signed by: Altha Harm, PhD, NP-C [phone] (Work Cell)

## 2020-01-05 ENCOUNTER — Telehealth: Payer: 59 | Admitting: Oncology

## 2020-01-07 ENCOUNTER — Telehealth: Payer: Self-pay | Admitting: *Deleted

## 2020-01-07 ENCOUNTER — Encounter: Payer: Self-pay | Admitting: *Deleted

## 2020-01-07 ENCOUNTER — Encounter: Payer: Self-pay | Admitting: Family Medicine

## 2020-01-07 DIAGNOSIS — I2510 Atherosclerotic heart disease of native coronary artery without angina pectoris: Secondary | ICD-10-CM | POA: Insufficient documentation

## 2020-01-07 DIAGNOSIS — I700 Atherosclerosis of aorta: Secondary | ICD-10-CM | POA: Insufficient documentation

## 2020-01-07 MED ORDER — ATORVASTATIN CALCIUM 10 MG PO TABS: 10.0000 mg | ORAL_TABLET | Freq: Every day | ORAL | 3 refills | Status: DC

## 2020-01-07 NOTE — Telephone Encounter (Signed)
The 10-year ASCVD risk score Denman George DC Montez Hageman., et al., 2013) is: 23.2%   Values used to calculate the score:     Age: 54 years     Sex: Female     Is Non-Hispanic African American: No     Diabetic: Yes     Tobacco smoker: Yes     Systolic Blood Pressure: 170 mmHg     Is BP treated: No     HDL Cholesterol: 42.4 mg/dL     Total Cholesterol: 243 mg/dL

## 2020-01-07 NOTE — Telephone Encounter (Signed)
Brenda Hawkins notified as instructed by telephone.  She denies any CP, SOB or fatigue.  She is willing to start a cholesterol medication.  Pharmacy: [facility].  She does have a 6 month follow up appointment scheduled for 03/10/20 and states they can discuss this further at that appointment.

## 2020-01-07 NOTE — Telephone Encounter (Signed)
-----   Message from Excell Seltzer, MD sent at 01/07/2020  9:28 AM EDT ----- Regarding: CT results  Let pt know that I have reviewed the results oif the lung cancer screening CT. As she is aware.. the is no lung issues, but incidentally there is aortic atheroscleroisi ( cholesterol plaque)  and in three coronary ( heart ) vessels. This test does not tell us how much is there but it can lead to blockage and heart attack. I would recommend that she stop smoking and start on a cholesterol lowering medication like atorvastatin. Any  CP , SOB, fatigue? If so she should have cardiology referral for stress test.   If she prefers she can make an appt to talk about this with me.  ----- Message ----- From: Jonne Ply, RN Sent: 01/07/2020   8:41 AM EDT To: Excell Seltzer, MD, Rosey Bath, MD  We will send notification of these results to Ms. Keitt. We will also contact her and schedule the annual lung screening scan close to the time it is due next year. Thanks, [employer]

## 2020-01-10 ENCOUNTER — Other Ambulatory Visit: Payer: Self-pay

## 2020-01-10 ENCOUNTER — Inpatient Hospital Stay: Payer: 59 | Attending: Hematology and Oncology

## 2020-01-10 VITALS — BP 173/88 | HR 85 | Resp 18

## 2020-01-10 DIAGNOSIS — E538 Deficiency of other specified B group vitamins: Secondary | ICD-10-CM | POA: Diagnosis present

## 2020-01-10 DIAGNOSIS — K9589 Other complications of other bariatric procedure: Secondary | ICD-10-CM

## 2020-01-10 DIAGNOSIS — D509 Iron deficiency anemia, unspecified: Secondary | ICD-10-CM

## 2020-01-10 MED ORDER — CYANOCOBALAMIN 1000 MCG/ML IJ SOLN
1000.0000 ug | Freq: Once | INTRAMUSCULAR | Status: AC
  Administered 2020-01-10: 1000 ug via INTRAMUSCULAR

## 2020-01-10 NOTE — Patient Instructions (Signed)

## 2020-02-14 ENCOUNTER — Inpatient Hospital Stay: Payer: 59 | Attending: Hematology and Oncology

## 2020-02-14 ENCOUNTER — Other Ambulatory Visit: Payer: Self-pay

## 2020-02-14 VITALS — BP 167/94 | HR 89 | Temp 98.4°F | Resp 18

## 2020-02-14 DIAGNOSIS — D508 Other iron deficiency anemias: Secondary | ICD-10-CM

## 2020-02-14 DIAGNOSIS — E538 Deficiency of other specified B group vitamins: Secondary | ICD-10-CM | POA: Insufficient documentation

## 2020-02-14 MED ORDER — CYANOCOBALAMIN 1000 MCG/ML IJ SOLN
1000.0000 ug | Freq: Once | INTRAMUSCULAR | Status: AC
  Administered 2020-02-14: 1000 ug via INTRAMUSCULAR

## 2020-02-14 MED ORDER — CYANOCOBALAMIN 1000 MCG/ML IJ SOLN
INTRAMUSCULAR | Status: AC
  Filled 2020-02-14: qty 1

## 2020-03-10 ENCOUNTER — Ambulatory Visit: Payer: 59 | Admitting: Family Medicine

## 2020-03-10 ENCOUNTER — Other Ambulatory Visit: Payer: Self-pay

## 2020-03-10 ENCOUNTER — Encounter: Payer: Self-pay | Admitting: Family Medicine

## 2020-03-10 VITALS — BP 142/98 | HR 77 | Ht 60.0 in | Wt 153.0 lb

## 2020-03-10 DIAGNOSIS — J4540 Moderate persistent asthma, uncomplicated: Secondary | ICD-10-CM

## 2020-03-10 DIAGNOSIS — I10 Essential (primary) hypertension: Secondary | ICD-10-CM | POA: Diagnosis not present

## 2020-03-10 DIAGNOSIS — E119 Type 2 diabetes mellitus without complications: Secondary | ICD-10-CM | POA: Diagnosis not present

## 2020-03-10 DIAGNOSIS — I700 Atherosclerosis of aorta: Secondary | ICD-10-CM

## 2020-03-10 DIAGNOSIS — E7800 Pure hypercholesterolemia, unspecified: Secondary | ICD-10-CM

## 2020-03-10 LAB — POCT GLYCOSYLATED HEMOGLOBIN (HGB A1C): Hemoglobin A1C: 5.8 % — AB (ref 4.0–5.6)

## 2020-03-10 LAB — HM DIABETES FOOT EXAM

## 2020-03-10 MED ORDER — ESOMEPRAZOLE MAGNESIUM 40 MG PO CPDR: 40.0000 mg | DELAYED_RELEASE_CAPSULE | Freq: Two times a day (BID) | ORAL | 6 refills | Status: DC

## 2020-03-10 MED ORDER — ALBUTEROL SULFATE HFA 108 (90 BASE) MCG/ACT IN AERS
2.0000 | INHALATION_SPRAY | Freq: Four times a day (QID) | RESPIRATORY_TRACT | 0 refills | Status: DC | PRN
Start: 2020-03-10 — End: 2022-04-12

## 2020-03-10 NOTE — Patient Instructions (Addendum)
Quit smoking. Start Zyrtec at night for asthma/allergies.  Can use albuterol as needed.  Work on Electrical engineer and healthy low cholesterol and carbohydrate diet.

## 2020-03-10 NOTE — Progress Notes (Signed)
Chief Complaint  Patient presents with  . Diabetes    History of Present Illness: HPI  54 year old female presents for DM  Diabetes:   Well controlled  Lab Results  Component Value Date   HGBA1C 5.8 (A) 03/10/2020  Using medications without difficulties: Hypoglycemic episodes: Hyperglycemic episodes: Feet problems: no ulcers Blood Sugars averaging: not checking eye exam within last year:   She has gained weight from anxiety. Wt Readings from Last 3 Encounters:  03/10/20 153 lb (69.4 kg)  01/04/20 140 lb (63.5 kg)  09/09/19 139 lb 8 oz (63.3 kg)    Diet: poor   No CP,no edema. She denies exertional SOB... noted SOB when outside at pool sitting.Marland Kitchen allergy related. Lung Cancer screen CT: aortic athersclerosis and 3 vessel Coronary atherosclerosis She is on atorvastatin 10 mg daily... no SE.Marland KitchenStarted 1 month ago. The 10-year ASCVD risk score Denman George DC Montez Hageman., et al., 2013) is: 16.9%   Values used to calculate the score:     Age: 3 years     Sex: Female     Is Non-Hispanic African American: No     Diabetic: Yes     Tobacco smoker: Yes     Systolic Blood Pressure: 142 mmHg     Is BP treated: No     HDL Cholesterol: 42.4 mg/dL     Total Cholesterol: 243 mg/dL   She has had some recent increase in occ chest tightness and cough.. feels like her asthma is acting up. No wheeze.   In past using albuterol.  She continued to smoke.      Exercise: walking as much as she can at work.      This visit occurred during the SARS-CoV-2 public health emergency.  Safety protocols were in place, including screening questions prior to the visit, additional usage of staff PPE, and extensive cleaning of exam room while observing appropriate contact time as indicated for disinfecting solutions.   COVID 19 screen:  No recent travel or known exposure to COVID19 The patient denies respiratory symptoms of COVID 19 at this time. The importance of social distancing was discussed today.      Review of Systems  Constitutional: Negative for chills and fever.  HENT: Negative for congestion and ear pain.   Eyes: Negative for pain and redness.  Respiratory: Negative for cough and shortness of breath.   Cardiovascular: Negative for chest pain, palpitations and leg swelling.  Gastrointestinal: Negative for abdominal pain, blood in stool, constipation, diarrhea, nausea and vomiting.  Genitourinary: Negative for dysuria.  Musculoskeletal: Negative for falls and myalgias.  Skin: Negative for rash.  Neurological: Negative for dizziness.  Psychiatric/Behavioral: Negative for depression. The patient is not nervous/anxious.       Past Medical History:  Diagnosis Date  . Anemia    in past  . Anxiety    no longer  . Asthma    seasonal  . Diabetes mellitus type II    no longer  . GERD (gastroesophageal reflux disease)    no longer  . Obesity   . Osteopenia   . Sleep apnea    no longer  . Symptomatic cholelithiasis 02/05/2019  . 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 Vitamin B12 deficiency    since bariatric surgery    reports that she has been smoking. She has a 55.50 pack-year smoking history. She has never used smokeless tobacco. She reports that she does not drink alcohol and does not use drugs.   Current Outpatient Medications:  .  atorvastatin (LIPITOR) 10 MG tablet, Take 1 tablet (10 mg total) by mouth daily., Disp: 90 tablet, Rfl: 3 .  esomeprazole (NEXIUM) 40 MG capsule, Take 1 capsule (40 mg total) by mouth 2 (two) times daily before a meal., Disp: 60 capsule, Rfl: 6 No current facility-administered medications for this visit.  Facility-Administered Medications Ordered in Other Visits:  .  sodium chloride 0.9 % injection 10 mL, 10 mL, Intracatheter, PRN, Orlie Dakin, Tollie Pizza, MD   Observations/Objective: Blood pressure (!) 142/98, pulse 77, height 5' (1.524 m), weight 153 lb (69.4 kg), SpO2 98 %.  Physical Exam Constitutional:      General: She is not in acute distress.     Appearance: Normal appearance. She is well-developed. She is obese. She is not ill-appearing or toxic-appearing.  HENT:     Head: Normocephalic.     Right Ear: Hearing, tympanic membrane, ear canal and external ear normal. Tympanic membrane is not erythematous, retracted or bulging.     Left Ear: Hearing, tympanic membrane, ear canal and external ear normal. Tympanic membrane is not erythematous, retracted or bulging.     Nose: No mucosal edema or rhinorrhea.     Right Sinus: No maxillary sinus tenderness or frontal sinus tenderness.     Left Sinus: No maxillary sinus tenderness or frontal sinus tenderness.     Mouth/Throat:     Pharynx: Uvula midline.  Eyes:     General: Lids are normal. Lids are everted, no foreign bodies appreciated.     Conjunctiva/sclera: Conjunctivae normal.     Pupils: Pupils are equal, round, and reactive to light.  Neck:     Thyroid: No thyroid mass or thyromegaly.     Vascular: No carotid bruit.     Trachea: Trachea normal.  Cardiovascular:     Rate and Rhythm: Normal rate and regular rhythm.     Pulses: Normal pulses.     Heart sounds: Normal heart sounds, S1 normal and S2 normal. No murmur heard.  No friction rub. No gallop.   Pulmonary:     Effort: Pulmonary effort is normal. No tachypnea or respiratory distress.     Breath sounds: Normal breath sounds. No decreased breath sounds, wheezing, rhonchi or rales.  Abdominal:     General: Bowel sounds are normal.     Palpations: Abdomen is soft.     Tenderness: There is no abdominal tenderness.  Musculoskeletal:     Cervical back: Normal range of motion and neck supple.  Skin:    General: Skin is warm and dry.     Findings: No rash.  Neurological:     Mental Status: She is alert.  Psychiatric:        Mood and Affect: Mood is not anxious or depressed.        Speech: Speech normal.        Behavior: Behavior normal. Behavior is cooperative.        Thought Content: Thought content normal.        Judgment:  Judgment normal.     Diabetic foot exam: Normal inspection No skin breakdown No calluses  Normal DP pulses Normal sensation to light touch and monofilament Nails normal   Assessment and Plan Diabetes mellitus type II, controlled  Good control with diet. Encouraged exercise, weight loss, healthy eating habits.   Essential hypertension, benign Well controlled. Continue current medication.   HYPERCHOLESTEROLEMIA No SE to statin after starting 1 month ago given aortic atherosclerosis noted on lung Cancer CT incidentally.  Asthma Quit  smoking. Start Zyrtec at night for asthma/allergies.  Can use albuterol as needed.   Aortic atherosclerosis (HCC) Now on statin.       Kerby Nora, MD

## 2020-03-14 ENCOUNTER — Encounter: Payer: Self-pay | Admitting: Hematology and Oncology

## 2020-03-14 ENCOUNTER — Inpatient Hospital Stay: Payer: 59 | Attending: Hematology and Oncology

## 2020-03-14 ENCOUNTER — Other Ambulatory Visit: Payer: Self-pay

## 2020-03-14 DIAGNOSIS — D751 Secondary polycythemia: Secondary | ICD-10-CM | POA: Diagnosis not present

## 2020-03-14 DIAGNOSIS — J45909 Unspecified asthma, uncomplicated: Secondary | ICD-10-CM | POA: Insufficient documentation

## 2020-03-14 DIAGNOSIS — E669 Obesity, unspecified: Secondary | ICD-10-CM | POA: Insufficient documentation

## 2020-03-14 DIAGNOSIS — E119 Type 2 diabetes mellitus without complications: Secondary | ICD-10-CM | POA: Diagnosis not present

## 2020-03-14 DIAGNOSIS — E538 Deficiency of other specified B group vitamins: Secondary | ICD-10-CM | POA: Diagnosis not present

## 2020-03-14 DIAGNOSIS — E611 Iron deficiency: Secondary | ICD-10-CM | POA: Diagnosis not present

## 2020-03-14 DIAGNOSIS — G4730 Sleep apnea, unspecified: Secondary | ICD-10-CM | POA: Insufficient documentation

## 2020-03-14 DIAGNOSIS — Z8249 Family history of ischemic heart disease and other diseases of the circulatory system: Secondary | ICD-10-CM | POA: Insufficient documentation

## 2020-03-14 DIAGNOSIS — K219 Gastro-esophageal reflux disease without esophagitis: Secondary | ICD-10-CM | POA: Diagnosis not present

## 2020-03-14 DIAGNOSIS — Z9884 Bariatric surgery status: Secondary | ICD-10-CM | POA: Insufficient documentation

## 2020-03-14 DIAGNOSIS — F17210 Nicotine dependence, cigarettes, uncomplicated: Secondary | ICD-10-CM | POA: Diagnosis not present

## 2020-03-14 DIAGNOSIS — Z79899 Other long term (current) drug therapy: Secondary | ICD-10-CM | POA: Insufficient documentation

## 2020-03-14 DIAGNOSIS — I2510 Atherosclerotic heart disease of native coronary artery without angina pectoris: Secondary | ICD-10-CM | POA: Insufficient documentation

## 2020-03-14 DIAGNOSIS — D508 Other iron deficiency anemias: Secondary | ICD-10-CM

## 2020-03-14 DIAGNOSIS — M8580 Other specified disorders of bone density and structure, unspecified site: Secondary | ICD-10-CM | POA: Diagnosis not present

## 2020-03-14 DIAGNOSIS — D7589 Other specified diseases of blood and blood-forming organs: Secondary | ICD-10-CM

## 2020-03-14 LAB — CBC WITH DIFFERENTIAL/PLATELET
Abs Immature Granulocytes: 0.03 10*3/uL (ref 0.00–0.07)
Basophils Absolute: 0.1 10*3/uL (ref 0.0–0.1)
Basophils Relative: 1 %
Eosinophils Absolute: 0.3 10*3/uL (ref 0.0–0.5)
Eosinophils Relative: 5 %
HCT: 44 % (ref 36.0–46.0)
Hemoglobin: 15 g/dL (ref 12.0–15.0)
Immature Granulocytes: 1 %
Lymphocytes Relative: 26 %
Lymphs Abs: 1.6 10*3/uL (ref 0.7–4.0)
MCH: 35.8 pg — ABNORMAL HIGH (ref 26.0–34.0)
MCHC: 34.1 g/dL (ref 30.0–36.0)
MCV: 105 fL — ABNORMAL HIGH (ref 80.0–100.0)
Monocytes Absolute: 0.4 10*3/uL (ref 0.1–1.0)
Monocytes Relative: 7 %
Neutro Abs: 3.8 10*3/uL (ref 1.7–7.7)
Neutrophils Relative %: 60 %
Platelets: 234 10*3/uL (ref 150–400)
RBC: 4.19 MIL/uL (ref 3.87–5.11)
RDW: 14.7 % (ref 11.5–15.5)
WBC: 6.3 10*3/uL (ref 4.0–10.5)
nRBC: 0 % (ref 0.0–0.2)

## 2020-03-14 LAB — FERRITIN: Ferritin: 7 ng/mL — ABNORMAL LOW (ref 11–307)

## 2020-03-14 NOTE — Progress Notes (Signed)
No new changes noted today. The patient Name and DOB has been verified by phone today. 

## 2020-03-14 NOTE — Progress Notes (Signed)
[facility]  [address], [address] [place], [place] [address] Phone: [phone]  Fax: [phone]   Clinic Day:  03/15/2020  Referring physician: Excell Seltzer, MD  Chief Complaint: Brenda Hawkins is a 54 y.o. female with s/p gastric bypass surgery with iron deficiency anemia and B12 deficiency who is seen for 6 month assessment.   HPI: The patient was last seen in the hematology clinic on 09/20/2019 via telemedicine. At that time, she was doing well.  She denies any bleeding.  Diet was good.  She was cutting back on smoking. Hematocrit was 47.5, hemoglobin 16.0, platelets 267,000, WBC 6,300. Ferritin was 13.   Low dose chest CT for lung cancer screening on 01/04/2020 revealed Lung-RADS 1S, negative. There was aortic atherosclerosis, in addition to left main and 3 vessel coronary artery disease.  There was mild diffuse bronchial wall thickening with very mild centrilobular and paraseptal emphysema; imaging findings were suggestive of underlying COPD.  Labs on 12/13/2019 revealed a hematocrit 45.4, hemoglobin 15.4, platelets 240,000, WBC 5,900. Ferritin 9.   The patient received monthly vitamin B12 injections x 4 (10/18/2019 - 02/14/2020).  She missed 11/2019 appointment.  During the interim, she has been good. Her energy and breathing are good, though she notes that she is feeling very lazy. She is actively trying to quit smoking and has gained 20 lbs and is now very anxious. She is currently smoking 2 packs per day, which is decreased. The patient is interested in [place]'s smoking cessation program.  The patient notes that her fingers are numb and that she hears a clicking sound when she bends her fingers. She is eating well. She denies any recent kidney stones.  The patient does not plan to receive the COVID-19 vaccine.   Past Medical History:  Diagnosis Date  . Anemia    in past  . Anxiety    no longer  . Asthma    seasonal  . Diabetes  mellitus type II    no longer  . GERD (gastroesophageal reflux disease)    no longer  . Obesity   . Osteopenia   . Sleep apnea    no longer  . Symptomatic cholelithiasis 02/05/2019  . 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 Vitamin B12 deficiency    since bariatric surgery    Past Surgical History:  Procedure Laterality Date  . CHOLECYSTECTOMY N/A 02/17/2019   Procedure: LAPAROSCOPIC CHOLECYSTECTOMY - DIABETIC;  Surgeon: Ancil Linsey, MD;  Location: [facility] ORS;  Service: General;  Laterality: N/A;  . CYSTOSCOPY W/ URETERAL STENT PLACEMENT Left 11/11/2018   Procedure: CYSTOSCOPY WITH STENT REMOVAL;  Surgeon: Riki Altes, MD;  Location: [facility] ORS;  Service: Urology;  Laterality: Left;  . CYSTOSCOPY WITH HOLMIUM LASER LITHOTRIPSY Left 10/24/2018   Procedure: CYSTOSCOPY WITH HOLMIUM LASER LITHOTRIPSy;  Surgeon: Crista Elliot, MD;  Location: [facility] ORS;  Service: Urology;  Laterality: Left;  . CYSTOSCOPY WITH STENT PLACEMENT Bilateral 10/24/2018   Procedure: CYSTOSCOPY WITH STENT PLACEMENT;  Surgeon: Crista Elliot, MD;  Location: [facility] ORS;  Service: Urology;  Laterality: Bilateral;  . CYSTOSCOPY/URETEROSCOPY/HOLMIUM LASER/STENT PLACEMENT Right 11/11/2018   Procedure: CYSTOSCOPY/URETEROSCOPY/HOLMIUM LASER/STENT EXCHANGE;  Surgeon: Riki Altes, MD;  Location: [facility] ORS;  Service: Urology;  Laterality: Right;  . ESOPHAGOGASTRODUODENOSCOPY (EGD) WITH PROPOFOL N/A 06/01/2018   Procedure: ESOPHAGOGASTRODUODENOSCOPY (EGD) WITH PROPOFOL;  Surgeon: Toney Reil, MD;  Location: [facility] ENDOSCOPY;  Service: Gastroenterology;  Laterality: N/A;  . GALLBLADDER SURGERY  02/17/2019  .  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 REDUCTION MAMMAPLASTY Right 1992  . URETEROSCOPY Left 10/24/2018   Procedure: URETEROSCOPY;  Surgeon: Crista Elliot, MD;  Location: [facility] ORS;  Service: Urology;  Laterality: Left;    Family History  Problem Relation Age of Onset  . Heart disease Mother        valve replacement surgery  .  Coronary artery disease Father   . Breast cancer Neg Hx     Social History:  reports that she has been smoking. She has a 55.50 pack-year smoking history. She has never used smokeless tobacco. She reports that she does not drink alcohol and does not use drugs. She quit smoking >20 years ago, but started smoking again 3-4 years ago.Smoking 2 packs a day, which is decreased. She would like to try and quit smoking this year (2020).She has a dog named Doctor, general practice.The patient lives in [place].Herhusband passed away from pancreatic cancer around 2016.  She has a 24-month-old old Shitzu puppy named Flossie.  The patient is alone today.  Allergies:  Allergies  Allergen Reactions  . Codeine Sulfate Other (See Comments)    Reaction: "violently ill"  . Penicillins Other (See Comments)    Did it involve swelling of the face/tongue/throat, SOB, or low BP? Unknown Did it involve sudden or severe rash/hives, skin peeling, or any reaction on the inside of your mouth or nose? Unknown Did you need to seek medical attention at a hospital or doctor's office? Unknown When did it last happen?Childhood allergy If all above answers are "NO", may proceed with cephalosporin use.   . Adhesive [Tape] Itching and Rash    PAPER TAPE IS FINE  . Latex Itching and Rash    Current Medications: Current Outpatient Medications  Medication Sig Dispense Refill  . albuterol (VENTOLIN HFA) 108 (90 Base) MCG/ACT inhaler Inhale 2 puffs into the lungs every 6 (six) hours as needed for wheezing or shortness of breath. 6.7 g 0  . atorvastatin (LIPITOR) 10 MG tablet Take 1 tablet (10 mg total) by mouth daily. 90 tablet 3  . esomeprazole (NEXIUM) 40 MG capsule Take 1 capsule (40 mg total) by mouth 2 (two) times daily before a meal. 60 capsule 6   No current facility-administered medications for this visit.   Facility-Administered Medications Ordered in Other Visits  Medication Dose Route Frequency Provider Last Rate Last  Admin  . sodium chloride 0.9 % injection 10 mL  10 mL Intracatheter PRN Jeralyn Ruths, MD        Review of Systems  Constitutional: Negative for chills, diaphoresis, fever, malaise/fatigue and weight loss (reports 20 lb weight gain).       Feels "good".  HENT: Negative.  Negative for congestion, ear discharge, ear pain, hearing loss, nosebleeds, sinus pain, sore throat and tinnitus.   Eyes: Negative.  Negative for blurred vision and double vision.  Respiratory: Negative.  Negative for cough, shortness of breath and wheezing.        Smoking decreased to two packs per day  Cardiovascular: Negative for chest pain, claudication, leg swelling and PND.       White coat hypertension.  Gastrointestinal: Negative.  Negative for abdominal pain, blood in stool, constipation, diarrhea, heartburn, melena, nausea and vomiting.       Eating well.  Genitourinary: Negative.  Negative for dysuria, frequency, hematuria and urgency.       Kidney stones s/p stent, lithotripsy, and stent removal.  Musculoskeletal: Negative.  Negative for back pain, joint pain and  myalgias.  Skin: Negative.  Negative for itching and rash.  Neurological: Positive for tingling (fingers). Negative for dizziness, sensory change, focal weakness, weakness and headaches.  Endo/Heme/Allergies: Negative.  Does not bruise/bleed easily.  Psychiatric/Behavioral: Negative for depression and memory loss. The patient is nervous/anxious. The patient does not have insomnia.        Feeling very lazy.  All other systems reviewed and are negative.  Performance status (ECOG):  0  Vitals There were no vitals taken for this visit.   Physical Exam Vitals and nursing note reviewed.  Constitutional:      General: She is not in acute distress.    Appearance: She is well-developed. She is not diaphoretic.  HENT:     Head: Normocephalic and atraumatic.     Mouth/Throat:     Mouth: Mucous membranes are dry.     Pharynx: Oropharynx is clear.   Eyes:     General: No scleral icterus.    Extraocular Movements: Extraocular movements intact.     Conjunctiva/sclera: Conjunctivae normal.     Pupils: Pupils are equal, round, and reactive to light.     Comments: Glasses.  Blue eyes.  Cardiovascular:     Rate and Rhythm: Normal rate and regular rhythm.     Heart sounds: Normal heart sounds.  Pulmonary:     Effort: Pulmonary effort is normal. No respiratory distress.     Breath sounds: Normal breath sounds. No wheezing or rales.  Chest:     Chest wall: No tenderness.  Abdominal:     General: Bowel sounds are normal. There is no distension.     Palpations: Abdomen is soft. There is no mass.     Tenderness: There is no abdominal tenderness. There is no guarding or rebound.  Musculoskeletal:        General: No swelling or tenderness. Normal range of motion.     Cervical back: Normal range of motion and neck supple.  Skin:    General: Skin is warm and dry.  Neurological:     Mental Status: She is alert and oriented to person, place, and time.  Psychiatric:        Behavior: Behavior normal.        Thought Content: Thought content normal.        Judgment: Judgment normal.     No visits with results within 3 Day(s) from this visit.  Latest known visit with results is:  Office Visit on 03/10/2020  Component Date Value Ref Range Status  . Hemoglobin A1C 03/10/2020 5.8* 4.0 - 5.6 % Final  . HM Diabetic Foot Exam 03/10/2020 done   Final    Assessment:  Brenda Hawkins is a 54 y.o. female with status post gastric bypass surgeryin 03/2002 and subsequent development of iron deficiency anemiaand B12 deficiency. She is intolerant of oral iron. She denies any melena or hematochezia. Stool guaiacs were negative on 01/02/2015. Dietis poor. She denies any melena or hematochezia. She has never had a colonoscopy. Patient refuses colonoscopy.  She has B12 deficiency. B12 was 172 on 06/10/2014 and 250 on 12/31/2016. She receives  B12 injections (began 01/16/2015; last 02/14/2020). Folate was 12.8 on 11/02/2018.  She has iron deficiency. 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 She received Venoferx4 (01/16/2015 - 02/07/2015), 10/10/2015, 05/06/2017, x 3 (05/08/2017 - 05/22/2017), and x3 (02/09/2018 - 02/23/2018).   Ferritinhas been followed: 1.5 on 09/19/2014, 59 on 02/07/2015, 10 on 03/16/2015, 6 on 06/16/2015, 5 on 10/09/2015, 5 on 01/01/2016, 6  on 04/01/2016, 14 on 07/08/2016, 13 on 08/06/2016, 5 on 05/07/2017, 35 on 08/08/2017, 6 on 11/04/2017, 7 on 02/03/2018, 19 on 05/05/2018, 16 on 08/04/2018, 111 on 11/02/2018, 160 on 11/30/2018, 97 on 12/28/2018, 155 on 03/08/2019, 14 on 06/28/2019, 13 on 09/17/2019, 9 on 12/13/2019, and 7 on 03/14/2020.  EGDon 06/01/2018 revealed patent Roux-en-Y gastrojejunostomy characterized by an intact staple line, visible sutures, congestion, erythema, friable mucosa and ulceration. There was a large clean based gastrojejunal anastomotic ulcer. There was a normal duodenal bulb and second portion of the duodenum. There was bleeding erosive gastropathytreated with bipolar cautery. There was normal gastroesophageal junction and esophagus. Biopsies were negative for malignancy. There was no H pylori. She is on Nexium40 mg BID.  Abdomen and pelvic CTon 10/23/2018 revealed moderate to severe right hydronephrosisdue to 10 mm calculus at the right UPJ. There was moderateleft hydronephrosisdue to 4 mm calculus in distal left ureter and 3 mm calculus at left UVJ. There was tiny bilateral intrarenal calculi. There was cholelithiasis and colonic diverticulosis. She underwent bilateral stentplacement on 10/24/2018.  She underwent cystoscopy, right ureteroscopy and stone removal, ureteroscopic laser lithotripsy, right ureteral stent placement, left ureteral stent removal, and right retrograde pyelography on 11/11/2018. She underwent right stent removal on 11/17/2018.   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 She denies any liver disease. Thyroid studies have been normal in the past. Labs on 10/30/2017revealed the following normal labs: folate (11.4), TSH (1.563), and reticulocyte count (1.7%). Folate was 6.5 on 08/08/2017. TSH was normal on 09/08/2017 and 05/05/2018.  She has a history of sleep apnea. She previously used CPAP when she was heavier. She is not using CPAP now.   She smokes 1 1/2 packs per day. She has an increasing hematocrit despite low iron stores suggestive of secondary erythrocytosisdue to smoking, sleep apnea, or a myeloproliferative disorder.  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 Work-upon 04/30/2020revealed carbon monoxide11.8% (0-3.6%), erythropoietin 19.3(2.6-18.5). [id number], exons 12-15,CALR, and MPL werenegative.  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 She was taking over 50 BC powders a week for chronic headaches. She had tinnitus. Salicylate level was 39.6 (2.8-30). She was hydrated in the ER on 07/09/2016. Follow-up salicylate level was 16.3.  Bilateral mammogramon 05/29/2017 revealed calcifications and possible asymmetry in the right breast. Left breast was unremarkable. Diagnostic mammogram and ultrasound of the right breast were ordered, but not performed. Patient refuses further breast imaging.  Low dose chest CT for lung cancer screening on 01/04/2020 revealed Lung-RADS 1S, negative.  She underwent a cholecystectomyon 02/17/2019.  The patient does not plan to receive the COVID-19 vaccine.  Symptomatically, she feels "lazy".  He denies any bleeding.  She is trying to quit smoking.  Exam is stable.  Plan: 1.   Review labs from 03/14/2020. 2.Iron deficiency Hematocrit47.5. Hemoglobin 16.0. MCV107.5 on 09/17/2019.  Ferritin13.  Hematocrit 44.0.  Hemoglobin 15.0.  MCV 105.0 on 03/14/2020.   Ferritin 7.              Discuss plan for likely Venofer in the next 1-2 months secondary to low iron stores.  She has a drive to make RBCs secondary to her smoking and secondary erythrocytosis.  Continue to monitor closely. 3. B12 deficiency Patient last received B12 on 02/14/2020.  B12 today and monthly x 6.  Check folate annually. 4. Macrocytic RBC indices Hematocrit 44.0.  Hemoglobin 15.0.  MCV 105 today.  Etiology remains unclear She has no known liver disease or myelodysplastic syndrome(MDS). 12 deficiency has been treated  Folate was 12.8 on 11/02/2018. TSH was normal on 09/09/2019. Consider bone marrow if counts decline to rule out MDS. 5.Erythrocytosis Hematocrit 44.0.  Hemoglobin 15.0 on 03/14/2020. Hemoglobinin the past has rangedbetween 16.4-17.2. Work-up on 01/07/2019 revealed  Erythropoietin 19.3(2.6-18.5).  JAK2 V617F, exons 12-15,CALR, and MPL werenegative.  Carbon monoxide11.8% (0-3.6%). Etiology is secondary to smoking and sleep apnea.                         Patient trying to cut back on smoking; she is smoking 2 packs a day.   Discuss smoking cessation program             Consider overnight oximetry to rule out sleep apnea. 6. Health maintenance Low-dose chest CT on 0/4/27/2021 was discussed with patient.  Continue annual imaging. 7.   No Venofer today. 8.   Smoking cessation consult. 9.   RTC in 2 months for labs (CBC, ferritin, folate). 10.   RTC in 4 months for MD assessment and labs (CBC with diff, ferritin-day before) and +/- Venofer.  I discussed the assessment and treatment plan with the patient.  The patient was provided an opportunity to ask questions and all were answered.  The patient agreed with the plan and demonstrated an understanding of the  instructions.  The patient was advised to call back if the symptoms worsen or if the condition fails to improve as anticipated.  I provided 16 minutes of face-to-face time during this this encounter and > 50% was spent counseling as documented under my assessment and plan. An additional 5 minutes were spent reviewing her chart (Epic and Care Everywhere) including notes, labs, and imaging studies.    Rosey BathMelissa C Katieann Hungate, MD, PhD    03/15/2020, 12:56 PM    I, Pietro CassisEmily Tufford, am acting as a scribe for Dr. Merlene Pullingorcoran.  I, Natalya Domzalski C. Merlene Pullingorcoran, MD, have reviewed the above documentation for accuracy and completeness, and I agree with the above.

## 2020-03-15 ENCOUNTER — Inpatient Hospital Stay (HOSPITAL_BASED_OUTPATIENT_CLINIC_OR_DEPARTMENT_OTHER): Payer: 59 | Admitting: Hematology and Oncology

## 2020-03-15 ENCOUNTER — Encounter: Payer: Self-pay | Admitting: Hematology and Oncology

## 2020-03-15 ENCOUNTER — Inpatient Hospital Stay: Payer: 59

## 2020-03-15 VITALS — BP 161/79 | HR 77 | Temp 97.9°F | Resp 18 | Ht 60.0 in | Wt 153.1 lb

## 2020-03-15 DIAGNOSIS — D509 Iron deficiency anemia, unspecified: Secondary | ICD-10-CM

## 2020-03-15 DIAGNOSIS — D508 Other iron deficiency anemias: Secondary | ICD-10-CM

## 2020-03-15 DIAGNOSIS — F17200 Nicotine dependence, unspecified, uncomplicated: Secondary | ICD-10-CM | POA: Diagnosis not present

## 2020-03-15 DIAGNOSIS — E538 Deficiency of other specified B group vitamins: Secondary | ICD-10-CM | POA: Diagnosis not present

## 2020-03-15 DIAGNOSIS — D7589 Other specified diseases of blood and blood-forming organs: Secondary | ICD-10-CM

## 2020-03-15 DIAGNOSIS — K9589 Other complications of other bariatric procedure: Secondary | ICD-10-CM

## 2020-03-15 DIAGNOSIS — D751 Secondary polycythemia: Secondary | ICD-10-CM | POA: Diagnosis not present

## 2020-03-21 DIAGNOSIS — F17200 Nicotine dependence, unspecified, uncomplicated: Secondary | ICD-10-CM | POA: Insufficient documentation

## 2020-04-04 ENCOUNTER — Inpatient Hospital Stay: Payer: 59 | Attending: Oncology | Admitting: Oncology

## 2020-04-04 DIAGNOSIS — Z716 Tobacco abuse counseling: Secondary | ICD-10-CM | POA: Diagnosis not present

## 2020-04-04 DIAGNOSIS — F17210 Nicotine dependence, cigarettes, uncomplicated: Secondary | ICD-10-CM | POA: Diagnosis not present

## 2020-04-04 MED ORDER — NICOTINE 21 MG/24HR TD PT24: 21.0000 mg | MEDICATED_PATCH | Freq: Every day | TRANSDERMAL | 0 refills | Status: DC

## 2020-04-04 NOTE — Progress Notes (Addendum)
Smoking Cessation Clinic  [facility]  [phone] [phone]  Patient Care Team: Excell Seltzer, MD as PCP - General Rosey Bath, MD as Consulting Physician (Hematology)   Name of the patient: Brenda Hawkins  [id number]  [birth year]   Date of visit: 04/04/2020    I connected with Brenda Hawkins on 04/04/20 at 10:30 AM EDT by telephone visit and verified that I am speaking with the correct person using two identifiers.   I discussed the limitations, risks, security and privacy concerns of performing an evaluation and management service by telemedicine and the availability of in-person appointments. I also discussed with the patient that there may be a patient responsible charge related to this service. The patient expressed understanding and agreed to proceed.   Patient's location: Home Provider's location: Clinic   Chief complaint/ Reason for visit- Smoking Cessation counseling  PMH-Brenda Hawkins has PMH significant for HTN, aleep apnea, gerd, DM type II, osteopenia, anxiety, and tobacco abuse who is followed by Brenda Hawkins for IDA and B12 deficiency s/p several iron and B12 infusions. She was referred to our clinic today to discuss effective smoking cessation strategies.  The goal of this program is to implement proven effective interventions in a clinical setting to successfully quit smoking.  Interval history-  During our visit today, we will discuss strategies that have worked for Brenda Hawkins in the past when she has quit smoking.  Brenda Hawkins has been successful in the past and remained tobacco free for greater than 20 years without any medication assitance.  Brenda Hawkins states she knows her triggers and how to avoid them.  She has attempted to quit again on several occasional but has been unsuccessful. Has anticipatory panic and anxiety when choosing quit dates.    Smoking history:   Tobacco Use: High Risk  . Smoking Tobacco Use:  Current Every Day Smoker  . Smokeless Tobacco Use: Never Used     Type of tobacco: Brenda Hawkins currently smokes 1 to 1.5 packs of cigarettes daily.  Approximate date of last quit attempt: 6-7 years ago  Longest period of time quit in the past: Quit for about 20 years   What caused the relapse:Stress/panic  Medication used in the past: None-previously quit cold [place] after she found out she was pregnant-has not attempted to use any OTC medications.   Readiness to quit: Very interested in quitting  Other smokers in household: Yes- Brenda Hawkins  Plan: Quit date:TBD  Today, Brenda Hawkins is doing well. States she is very motivated to quit smoking but has been discouraged because she was unable to do it on her own. She has tried to quit recently (within the last 3 months) on multiple occasional and has been unsuccessful secondary to anticipating her quit date and "panicing".  She currently is healthy and denies any respiratory concerns.  Her fatigue has improved since beginning iron and B12 injections with Brenda Hawkins. She has gained weight. She has yearly LDCT scans for lung cancer screening.    ECOG FS:0 - Asymptomatic  Review of systems- Review of Systems  Constitutional: Positive for malaise/fatigue. Negative for chills, fever and weight loss.  HENT: Negative for congestion, ear pain and tinnitus.   Eyes: Negative.  Negative for blurred vision and double vision.  Respiratory: Negative.  Negative for cough, sputum production and shortness of breath.   Cardiovascular: Negative.  Negative for chest pain, palpitations and leg swelling.  Gastrointestinal: Negative.  Negative for abdominal  pain, constipation, diarrhea, nausea and vomiting.  Genitourinary: Negative for dysuria, frequency and urgency.  Musculoskeletal: Negative for back pain and falls.  Skin: Negative.  Negative for rash.  Neurological: Negative.  Negative for weakness and headaches.  Endo/Heme/Allergies: Negative.  Does not  bruise/bleed easily.  Psychiatric/Behavioral: Negative.  Negative for depression. The patient is not nervous/anxious and does not have insomnia.     Allergies  Allergen Reactions  . Codeine Sulfate Other (See Comments)    Reaction: "violently ill"  . Penicillins Other (See Comments)    Did it involve swelling of the face/tongue/throat, SOB, or low BP? Unknown Did it involve sudden or severe rash/hives, skin peeling, or any reaction on the inside of your mouth or nose? Unknown Did you need to seek medical attention at a hospital or doctor's office? Unknown When did it last happen?Childhood allergy If all above answers are "NO", may proceed with cephalosporin use.   . Adhesive [Tape] Itching and Rash    PAPER TAPE IS FINE  . Latex Itching and Rash     Past Medical History:  Diagnosis Date  . Anemia    in past  . Anxiety    no longer  . Asthma    seasonal  . Diabetes mellitus type II    no longer  . GERD (gastroesophageal reflux disease)    no longer  . Obesity   . Osteopenia   . Sleep apnea    no longer  . Symptomatic cholelithiasis 02/05/2019  . 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 Vitamin B12 deficiency    since bariatric surgery     Past Surgical History:  Procedure Laterality Date  . CHOLECYSTECTOMY N/A 02/17/2019   Procedure: LAPAROSCOPIC CHOLECYSTECTOMY - DIABETIC;  Surgeon: Ancil Linsey, MD;  Location: [facility];  Service: General;  Laterality: N/A;  . CYSTOSCOPY W/ URETERAL STENT PLACEMENT Left 11/11/2018   Procedure: CYSTOSCOPY WITH STENT REMOVAL;  Surgeon: Riki Altes, MD;  Location: [facility];  Service: Urology;  Laterality: Left;  . CYSTOSCOPY WITH HOLMIUM LASER LITHOTRIPSY Left 10/24/2018   Procedure: CYSTOSCOPY WITH HOLMIUM LASER LITHOTRIPSy;  Surgeon: Crista Elliot, MD;  Location: [facility];  Service: Urology;  Laterality: Left;  . CYSTOSCOPY WITH STENT PLACEMENT Bilateral 10/24/2018   Procedure: CYSTOSCOPY WITH STENT PLACEMENT;  Surgeon: Crista Elliot, MD;  Location: [facility];  Service: Urology;  Laterality: Bilateral;  . CYSTOSCOPY/URETEROSCOPY/HOLMIUM LASER/STENT PLACEMENT Right 11/11/2018   Procedure: CYSTOSCOPY/URETEROSCOPY/HOLMIUM LASER/STENT EXCHANGE;  Surgeon: Riki Altes, MD;  Location: [facility];  Service: Urology;  Laterality: Right;  . ESOPHAGOGASTRODUODENOSCOPY (EGD) WITH PROPOFOL N/A 06/01/2018   Procedure: ESOPHAGOGASTRODUODENOSCOPY (EGD) WITH PROPOFOL;  Surgeon: Toney Reil, MD;  Location[facility];  Service: Gastroenterology;  Laterality: N/A;  . GALLBLADDER SURGERY  02/17/2019  . 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 REDUCTION MAMMAPLASTY Right 1992  . URETEROSCOPY Left 10/24/2018   Procedure: URETEROSCOPY;  Surgeon: Crista Elliot, MD;  Location: [facility];  Service: Urology;  Laterality: Left;    Social History   Socioeconomic History  . Marital status: Married    Spouse name: Not on file  . Number of children: 1  . Years of education: Not on file  . Highest education level: Not on file  Occupational History  . Occupation: homemaker  Tobacco Use  . Smoking status: Current Every Day Smoker    Packs/day: 1.50    Years: 37.00    Pack years: 55.50  . Smokeless tobacco: Never Used  Vaping  Use  . Vaping Use: Never used  Substance and Sexual Activity  . Alcohol use: No  . Drug use: No  . Sexual activity: Yes    Birth control/protection: None  Other Topics Concern  . Not on file  Social History Narrative   Sporadic with exercise   Social Determinants of Health   Financial Resource Strain:   . Difficulty of Paying Living Expenses:   Food Insecurity:   . Worried About Programme researcher, broadcasting/film/video in the Last Year:   . Barista in the Last Year:   Transportation Needs:   . Freight forwarder (Medical):   Marland Kitchen Lack of Transportation (Non-Medical):   Physical Activity:   . Days of Exercise per Week:   . Minutes of Exercise per Session:   Stress:   . Feeling of Stress :   Social Connections:   .  Frequency of Communication with Friends and Family:   . Frequency of Social Gatherings with Friends and Family:   . Attends Religious Services:   . Active Member of Clubs or Organizations:   . Attends Banker Meetings:   Marland Kitchen Marital Status:   Intimate Partner Violence:   . Fear of Current or Ex-Partner:   . Emotionally Abused:   Marland Kitchen Physically Abused:   . Sexually Abused:     Family History  Problem Relation Age of Onset  . Heart disease Mother        valve replacement surgery  . Coronary artery disease Father   . Breast cancer Neg Hx      Current Outpatient Medications:  .  albuterol (VENTOLIN HFA) 108 (90 Base) MCG/ACT inhaler, Inhale 2 puffs into the lungs every 6 (six) hours as needed for wheezing or shortness of breath. (Patient not taking: Reported on 03/14/2020), Disp: 6.7 g, Rfl: 0 .  atorvastatin (LIPITOR) 10 MG tablet, Take 1 tablet (10 mg total) by mouth daily., Disp: 90 tablet, Rfl: 3 .  esomeprazole (NEXIUM) 40 MG capsule, Take 1 capsule (40 mg total) by mouth 2 (two) times daily before a meal., Disp: 60 capsule, Rfl: 6 .  nicotine (NICODERM CQ) 21 mg/24hr patch, Place 1 patch (21 mg total) onto the skin daily., Disp: 28 patch, Rfl: 0 No current facility-administered medications for this visit.  Facility-Administered Medications Ordered in Other Visits:  .  sodium chloride 0.9 % injection 10 mL, 10 mL, Intracatheter, PRN, Orlie Dakin, Tollie Pizza, MD  Physical exam: There were no vitals filed for this visit. Limited due to telephone visit  CMP Latest Ref Rng & Units 09/09/2019  Glucose 70 - 99 mg/dL 87  BUN 6 - 23 mg/dL 22  Creatinine 2.68 - 3.41 mg/dL 9.62  Sodium 229 - 798 mEq/L 140  Potassium 3.5 - 5.1 mEq/L 4.1  Chloride 96 - 112 mEq/L 106  CO2 19 - 32 mEq/L 24  Calcium 8.4 - 10.5 mg/dL 9.4  Total Protein 6.0 - 8.3 g/dL 7.0  Total Bilirubin 0.2 - 1.2 mg/dL 0.2  Alkaline Phos 39 - 117 U/L 79  AST 0 - 37 U/L 17  ALT 0 - 35 U/L 16   CBC Latest Ref Rng  & Units 03/14/2020  WBC 4.0 - 10.5 K/uL 6.3  Hemoglobin 12.0 - 15.0 g/dL 92.1  Hematocrit 36 - 46 % 44.0  Platelets 150 - 400 K/uL 234    No images are attached to the encounter.  No results found.   Assessment and plan- Patient is a 54 y.o. femalewho  presents to smoking cessation clinic to discuss strategies, medications and interventions needed to quit smoking.  Topics covered: Physical improvements following smoking cessation Change in daily routines Dealing with urges to smoke Getting support Anticipating/avoiding triggers Secondhand smoke Behavioral skills Reinforced benefits  Reviewed pharmacotherapy: OTC medications include:  Nicotine replacement therapy (NRT) gum  NRT lozenge  NRT patch  NRT patch plus combination of gum or lozenge  Medical treatment:  NRT nasal spray: Dosing 1-2 doses per hour (8-40 doses per day); 1 dose equals 1 spray in each nostril; each spray level 0.5 mg of nicotine  NRT oral inhaler: Dosing 6-16 cartridges per day; initially use 1 cartridge every 1-2 hours  Bupropion SR: Dosing: Begin 1 to 2 weeks prior to quit date; 150 mg by mouth every a.m. x3 days, then increase to 150 mg by mouth twice daily.  Contraindications: Head injury, seizures, eating disorders, MAOI inhibitor.  Verenicline: Dosing: Begin 1 week prior to quit date; days 1 through 3 0.5 mg by mouth every a.m.; days 4 through 7; 0.5 mg p.o. twice daily; weeks 2 through 12: 1 mg p.o. twice daily.  Monitor for neuropsychiatric symptoms.  Follow-up plan:  Based on our discussion today, you have decided to initiate 21 mg nicotine patch daily.  Prescription called into pharmacy.  Given patient's anxiety and panic feeling over quitting smoking, will start with nicotine patch only at maximum starting strength for 2 weeks to see if we can decrease the amount of cigarettes she is smoking daily.  Plan is to see her back in approximately 2 weeks to assess toleration and make adjustments as  needed.  Quit date is to be determined.  We discussed side effects of nicotine patch.  She also may use OTC nicotine items such as gum and lozenges.   Disposition:RTC on 04/17/2020 to assess toleration of nicotine patch.  Visit Diagnosis 1. Encounter for smoking cessation counseling   2. Cigarette nicotine dependence without complication     Patient expressed understanding and was in agreement with this plan. She also understands that She can call clinic at any time with any questions, concerns, or complaints.   I provided 22 minutes of non face-to-face telephone visit time during this encounter, and > 50% was spent counseling as documented under my assessment & plan.  Greater than 50% was spent in counseling and coordination of care with this patient including but not limited to discussion of the relevant topics above (See A&P) including, but not limited to diagnosis and management of acute and chronic medical conditions.   Thank you for allowing me to participate in the care of this very pleasant patient.    Mauro KaufmannJennifer E Charlesetta Milliron, NP [facility] Cell - [phone] Pager- [phone] 04/04/2020 3:20 PM

## 2020-04-17 ENCOUNTER — Inpatient Hospital Stay: Payer: 59 | Attending: Hematology and Oncology

## 2020-04-17 ENCOUNTER — Other Ambulatory Visit: Payer: Self-pay

## 2020-04-17 ENCOUNTER — Inpatient Hospital Stay (HOSPITAL_BASED_OUTPATIENT_CLINIC_OR_DEPARTMENT_OTHER): Payer: 59 | Admitting: Oncology

## 2020-04-17 DIAGNOSIS — E538 Deficiency of other specified B group vitamins: Secondary | ICD-10-CM | POA: Diagnosis present

## 2020-04-17 DIAGNOSIS — F17210 Nicotine dependence, cigarettes, uncomplicated: Secondary | ICD-10-CM

## 2020-04-17 DIAGNOSIS — D508 Other iron deficiency anemias: Secondary | ICD-10-CM

## 2020-04-17 MED ORDER — BUPROPION HCL ER (XL) 150 MG PO TB24
150.0000 mg | ORAL_TABLET | Freq: Every day | ORAL | 0 refills | Status: DC
Start: 2020-04-17 — End: 2020-09-12

## 2020-04-17 MED ORDER — CYANOCOBALAMIN 1000 MCG/ML IJ SOLN
1000.0000 ug | Freq: Once | INTRAMUSCULAR | Status: AC
  Administered 2020-04-17: 1000 ug via INTRAMUSCULAR

## 2020-04-17 NOTE — Progress Notes (Signed)
Smoking Cessation Clinic  [facility]  [phone] [phone]  Patient Care Team: Excell Seltzer, MD as PCP - General Rosey Bath, MD as Consulting Physician (Hematology)   Name of the patient: Brenda Hawkins  [id number]  Mar 05, 1955   Date of visit: 04/17/2020    I connected with Elinora Weigand on 05/19/20 at  2:15 PM EDT by telephone visit and verified that I am speaking with the correct person using two identifiers.   I discussed the limitations, risks, security and privacy concerns of performing an evaluation and management service by telemedicine and the availability of in-person appointments. I also discussed with the patient that there may be a patient responsible charge related to this service. The patient expressed understanding and agreed to proceed.   Patient's location: Home Provider's location: Clinic   Chief complaint/ Reason for visit- Smoking Cessation counseling  PMH-Mrs. Harlacher has PMH significant for HTN, aleep apnea, gerd, DM type II, osteopenia, anxiety, and tobacco abuse who is followed by Dr. Merlene Pulling for IDA and B12 deficiency s/p several iron and B12 infusions. She was referred to our clinic today to discuss effective smoking cessation strategies.  The goal of this program is to implement proven effective interventions in a clinical setting to successfully quit smoking.  Interval history-  During our visit today, we will discuss strategies that have worked for Ms. Brockman in the past when she has quit smoking.  Mrs. Kaiser has been successful in the past and remained tobacco free for greater than 20 years without any medication assitance.  Ms. Lenhardt states she knows her triggers and how to avoid them.  She has attempted to quit again on several occasional but has been unsuccessful. Has anticipatory panic and anxiety when choosing quit dates.    Smoking history:   Tobacco Use: High Risk  . Smoking Tobacco Use:  Current Every Day Smoker  . Smokeless Tobacco Use: Never Used     Type of tobacco: Mrs. Arauz currently smokes 1 to 1.5 packs of cigarettes daily.  Approximate date of last quit attempt: 6-7 years ago  Longest period of time quit in the past: Quit for about 20 years   What caused the relapse:Stress/panic  Medication used in the past: None-previously quit cold [place] after she found out she was pregnant-has not attempted to use any OTC medications.   Readiness to quit: Very interested in quitting  Other smokers in household: Yes- Husband  Plan: Quit date:TBD  Today, Mrs. Stukes is doing well. States she is very motivated to quit smoking but has been discouraged because she was unable to do it on her own. She has tried to quit recently (within the last 3 months) on multiple occasional and has been unsuccessful secondary to anticipating her quit date and "panicing".  She currently is healthy and denies any respiratory concerns.  Her fatigue has improved since beginning iron and B12 injections with Dr. Merlene Pulling. She has gained weight. She has yearly LDCT scans for lung cancer screening.    ECOG FS:0 - Asymptomatic  Review of systems- Review of Systems  Constitutional: Positive for malaise/fatigue. Negative for chills, fever and weight loss.  HENT: Negative for congestion, ear pain and tinnitus.   Eyes: Negative.  Negative for blurred vision and double vision.  Respiratory: Negative.  Negative for cough, sputum production and shortness of breath.   Cardiovascular: Negative.  Negative for chest pain, palpitations and leg swelling.  Gastrointestinal: Negative.  Negative for  abdominal pain, constipation, diarrhea, nausea and vomiting.  Genitourinary: Negative for dysuria, frequency and urgency.  Musculoskeletal: Negative for back pain and falls.  Skin: Negative.  Negative for rash.  Neurological: Negative.  Negative for weakness and headaches.  Endo/Heme/Allergies: Negative.  Does not  bruise/bleed easily.  Psychiatric/Behavioral: Negative.  Negative for depression. The patient is not nervous/anxious and does not have insomnia.     Allergies  Allergen Reactions  . Codeine Sulfate Other (See Comments)    Reaction: "violently ill"  . Penicillins Other (See Comments)    Did it involve swelling of the face/tongue/throat, SOB, or low BP? Unknown Did it involve sudden or severe rash/hives, skin peeling, or any reaction on the inside of your mouth or nose? Unknown Did you need to seek medical attention at a hospital or doctor's office? Unknown When did it last happen?Childhood allergy If all above answers are "NO", may proceed with cephalosporin use.   . Adhesive [Tape] Itching and Rash    PAPER TAPE IS FINE  . Latex Itching and Rash     Past Medical History:  Diagnosis Date  . Anemia    in past  . Anxiety    no longer  . Asthma    seasonal  . Diabetes mellitus type II    no longer  . GERD (gastroesophageal reflux disease)    no longer  . Obesity   . Osteopenia   . Sleep apnea    no longer  . Symptomatic cholelithiasis 02/05/2019  . 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 Vitamin B12 deficiency    since bariatric surgery     Past Surgical History:  Procedure Laterality Date  . CHOLECYSTECTOMY N/A 02/17/2019   Procedure: LAPAROSCOPIC CHOLECYSTECTOMY - DIABETIC;  Surgeon: Ancil Linsey, MD;  Location: [facility] ORS;  Service: General;  Laterality: N/A;  . CYSTOSCOPY W/ URETERAL STENT PLACEMENT Left 11/11/2018   Procedure: CYSTOSCOPY WITH STENT REMOVAL;  Surgeon: Riki Altes, MD;  Location: [facility];  Service: Urology;  Laterality: Left;  . CYSTOSCOPY WITH HOLMIUM LASER LITHOTRIPSY Left 10/24/2018   Procedure: CYSTOSCOPY WITH HOLMIUM LASER LITHOTRIPSy;  Surgeon: Crista Elliot, MD;  Location: [facility];  Service: Urology;  Laterality: Left;  . CYSTOSCOPY WITH STENT PLACEMENT Bilateral 10/24/2018   Procedure: CYSTOSCOPY WITH STENT PLACEMENT;  Surgeon: Crista Elliot, MD;  Location: [facility];  Service: Urology;  Laterality: Bilateral;  . CYSTOSCOPY/URETEROSCOPY/HOLMIUM LASER/STENT PLACEMENT Right 11/11/2018   Procedure: CYSTOSCOPY/URETEROSCOPY/HOLMIUM LASER/STENT EXCHANGE;  Surgeon: Riki Altes, MD;  Location: [facility];  Service: Urology;  Laterality: Right;  . ESOPHAGOGASTRODUODENOSCOPY (EGD) WITH PROPOFOL N/A 06/01/2018   Procedure: ESOPHAGOGASTRODUODENOSCOPY (EGD) WITH PROPOFOL;  Surgeon: Toney Reil, MD;  Location: [facility] ENDOSCOPY;  Service: Gastroenterology;  Laterality: N/A;  . GALLBLADDER SURGERY  02/17/2019  . 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 REDUCTION MAMMAPLASTY Right 1992  . URETEROSCOPY Left 10/24/2018   Procedure: URETEROSCOPY;  Surgeon: Crista Elliot, MD;  Location: [facility] ORS;  Service: Urology;  Laterality: Left;    Social History   Socioeconomic History  . Marital status: Married    Spouse name: Not on file  . Number of children: 1  . Years of education: Not on file  . Highest education level: Not on file  Occupational History  . Occupation: homemaker  Tobacco Use  . Smoking status: Current Every Day Smoker    Packs/day: 1.50    Years: 37.00    Pack years: 55.50  . Smokeless tobacco: Never Used  Vaping Use  . Vaping Use: Never used  Substance and Sexual Activity  . Alcohol use: No  . Drug use: No  . Sexual activity: Yes    Birth control/protection: None  Other Topics Concern  . Not on file  Social History Narrative   Sporadic with exercise   Social Determinants of Health   Financial Resource Strain:   . Difficulty of Paying Living Expenses: Not on file  Food Insecurity:   . Worried About Programme researcher, broadcasting/film/videounning Out of Food in the Last Year: Not on file  . Ran Out of Food in the Last Year: Not on file  Transportation Needs:   . Lack of Transportation (Medical): Not on file  . Lack of Transportation (Non-Medical): Not on file  Physical Activity:   . Days of Exercise per Week: Not on file  . Minutes of Exercise per  Session: Not on file  Stress:   . Feeling of Stress : Not on file  Social Connections:   . Frequency of Communication with Friends and Family: Not on file  . Frequency of Social Gatherings with Friends and Family: Not on file  . Attends Religious Services: Not on file  . Active Member of Clubs or Organizations: Not on file  . Attends BankerClub or Organization Meetings: Not on file  . Marital Status: Not on file  Intimate Partner Violence:   . Fear of Current or Ex-Partner: Not on file  . Emotionally Abused: Not on file  . Physically Abused: Not on file  . Sexually Abused: Not on file    Family History  Problem Relation Age of Onset  . Heart disease Mother        valve replacement surgery  . Coronary artery disease Father   . Breast cancer Neg Hx      Current Outpatient Medications:  .  albuterol (VENTOLIN HFA) 108 (90 Base) MCG/ACT inhaler, Inhale 2 puffs into the lungs every 6 (six) hours as needed for wheezing or shortness of breath. (Patient not taking: Reported on 03/14/2020), Disp: 6.7 g, Rfl: 0 .  atorvastatin (LIPITOR) 10 MG tablet, Take 1 tablet (10 mg total) by mouth daily., Disp: 90 tablet, Rfl: 3 .  buPROPion (WELLBUTRIN XL) 150 MG 24 hr tablet, Take 1 tablet (150 mg total) by mouth daily., Disp: 60 tablet, Rfl: 0 .  esomeprazole (NEXIUM) 40 MG capsule, Take 1 capsule (40 mg total) by mouth 2 (two) times daily before a meal., Disp: 60 capsule, Rfl: 6 .  nicotine (NICODERM CQ) 21 mg/24hr patch, Place 1 patch (21 mg total) onto the skin daily., Disp: 28 patch, Rfl: 0 No current facility-administered medications for this visit.  Facility-Administered Medications Ordered in Other Visits:  .  sodium chloride 0.9 % injection 10 mL, 10 mL, Intracatheter, PRN, Orlie DakinFinnegan, Tollie Pizzaimothy J, MD  Physical exam: There were no vitals filed for this visit. Limited due to telephone visit  CMP Latest Ref Rng & Units 09/09/2019  Glucose 70 - 99 mg/dL 87  BUN 6 - 23 mg/dL 22  Creatinine 1.610.40 -  1.20 mg/dL 0.960.68  Sodium 045135 - 409145 mEq/L 140  Potassium 3.5 - 5.1 mEq/L 4.1  Chloride 96 - 112 mEq/L 106  CO2 19 - 32 mEq/L 24  Calcium 8.4 - 10.5 mg/dL 9.4  Total Protein 6.0 - 8.3 g/dL 7.0  Total Bilirubin 0.2 - 1.2 mg/dL 0.2  Alkaline Phos 39 - 117 U/L 79  AST 0 - 37 U/L 17  ALT 0 - 35 U/L 16  CBC Latest Ref Rng & Units 03/14/2020  WBC 4.0 - 10.5 K/uL 6.3  Hemoglobin 12.0 - 15.0 g/dL 81.0  Hematocrit 36 - 46 % 44.0  Platelets 150 - 400 K/uL 234    No images are attached to the encounter.  No results found.   Assessment and plan- Patient is a 54 y.o. femalewho presents to smoking cessation clinic to discuss strategies, medications and interventions needed to quit smoking.  Topics covered: Physical improvements following smoking cessation Change in daily routines Dealing with urges to smoke Getting support Anticipating/avoiding triggers Secondhand smoke Behavioral skills Reinforced benefits  Reviewed pharmacotherapy: OTC medications include:  Nicotine replacement therapy (NRT) gum  NRT lozenge  NRT patch  NRT patch plus combination of gum or lozenge  Medical treatment:  NRT nasal spray: Dosing 1-2 doses per hour (8-40 doses per day); 1 dose equals 1 spray in each nostril; each spray level 0.5 mg of nicotine  NRT oral inhaler: Dosing 6-16 cartridges per day; initially use 1 cartridge every 1-2 hours  Bupropion SR: Dosing: Begin 1 to 2 weeks prior to quit date; 150 mg by mouth every a.m. x3 days, then increase to 150 mg by mouth twice daily.  Contraindications: Head injury, seizures, eating disorders, MAOI inhibitor.  Verenicline: Dosing: Begin 1 week prior to quit date; days 1 through 3 0.5 mg by mouth every a.m.; days 4 through 7; 0.5 mg p.o. twice daily; weeks 2 through 12: 1 mg p.o. twice daily.  Monitor for neuropsychiatric symptoms.  Follow-up plan:  Based on our discussion today, you have decided to initiate 21 mg nicotine patch daily.  Prescription called into  pharmacy.  Given patient's anxiety and panic feeling over quitting smoking, will start with nicotine patch only at maximum starting strength for 2 weeks to see if we can decrease the amount of cigarettes she is smoking daily.  Plan is to see her back in approximately 2 weeks to assess toleration and make adjustments as needed.  Plan:  Rx wellbutrin 150 mg daily Stop Nicotine patch   Quit date is to be determined.  We discussed side effects of nicotine patch.  She also may use OTC nicotine items such as gum and lozenges.   Disposition: RTC 05/19/2020 to assess tolerance of medication.   Visit Diagnosis 1. Cigarette nicotine dependence without complication     Patient expressed understanding and was in agreement with this plan. She also understands that She can call clinic at any time with any questions, concerns, or complaints.   I provided 22 minutes of non face-to-face telephone visit time during this encounter, and > 50% was spent counseling as documented under my assessment & plan.  Greater than 50% was spent in counseling and coordination of care with this patient including but not limited to discussion of the relevant topics above (See A&P) including, but not limited to diagnosis and management of acute and chronic medical conditions.   Thank you for allowing me to participate in the care of this very pleasant patient.    Mauro Kaufmann, NP [facility] at [facility] Cell - [phone] Pager- [phone] 05/19/2020 10:20 AM

## 2020-04-19 ENCOUNTER — Other Ambulatory Visit (HOSPITAL_COMMUNITY): Payer: Self-pay | Admitting: Oncology

## 2020-05-10 NOTE — Assessment & Plan Note (Signed)
Now on statin.

## 2020-05-10 NOTE — Assessment & Plan Note (Signed)
Well controlled. Continue current medication.  

## 2020-05-10 NOTE — Assessment & Plan Note (Signed)
No SE to statin after starting 1 month ago given aortic atherosclerosis noted on lung Cancer CT incidentally.

## 2020-05-10 NOTE — Assessment & Plan Note (Signed)
Good control with diet. Encouraged exercise, weight loss, healthy eating habits.  

## 2020-05-10 NOTE — Assessment & Plan Note (Signed)
Quit smoking. Start Zyrtec at night for asthma/allergies.  Can use albuterol as needed.

## 2020-05-19 ENCOUNTER — Other Ambulatory Visit (HOSPITAL_BASED_OUTPATIENT_CLINIC_OR_DEPARTMENT_OTHER): Payer: 59 | Admitting: Oncology

## 2020-05-19 DIAGNOSIS — Z716 Tobacco abuse counseling: Secondary | ICD-10-CM | POA: Diagnosis not present

## 2020-05-19 DIAGNOSIS — F17210 Nicotine dependence, cigarettes, uncomplicated: Secondary | ICD-10-CM

## 2020-05-19 NOTE — Progress Notes (Signed)
Virtual Visit via Telephone Note  Smoking cessation follow-up  I connected with [name] on 05/19/20 at  by telephone and verified that I am speaking with the correct person using two identifiers.  Location: Patient: Home Provider: Clinic    I discussed the limitations, risks, security and privacy concerns of performing an evaluation and management service by telephone and the availability of in person appointments. I also discussed with the patient that there may be a patient responsible charge related to this service. The patient expressed understanding and agreed to proceed.   History of Present Illness:Brenda Hawkins has PMH significant for HTN, aleep apnea, gerd, DM type II, osteopenia, anxiety, and tobacco abuse who is followed by Dr. Merlene Pulling for IDA and B12 deficiency s/p several iron and B12 infusions. She was referred to our clinic today to discuss effective smoking cessation strategies.  The goal of this program is to implement proven effective interventions in a clinical setting to successfully quit smoking.  Smoking cessation course- 04/04/2020-initial contact.  Started nicotine patch 21 mg daily. 04/17/2020-follow-up visit.  No significant decrease in smoking with nicotine patch.  Stop nicotine patch start Wellbutrin 150 mg daily.  Observations/Objective: Spoke to [name] for follow-up and to assess tolerance of new prescription Wellbutrin 150 mg daily.  She states she is doing "really well".  She was able to decrease her cigarette smoking from 2 packs/day down to half a pack per day.  She is very pleased with herself.  States she feels much better and feels her respiratory symptoms have improved especially coughing at night.  Assessment and Plan:  Review of Systems  Constitutional: Negative.  Negative for chills, fever, malaise/fatigue and weight loss.  HENT: Negative for congestion, ear pain and tinnitus.   Eyes: Negative.  Negative for blurred vision and double vision.   Respiratory: Negative.  Negative for cough, sputum production and shortness of breath.   Cardiovascular: Negative.  Negative for chest pain, palpitations and leg swelling.  Gastrointestinal: Negative.  Negative for abdominal pain, constipation, diarrhea, nausea and vomiting.  Genitourinary: Negative for dysuria, frequency and urgency.  Musculoskeletal: Negative for back pain and falls.  Skin: Negative.  Negative for rash.  Neurological: Negative.  Negative for weakness and headaches.  Endo/Heme/Allergies: Negative.  Does not bruise/bleed easily.  Psychiatric/Behavioral: Negative.  Negative for depression. The patient is not nervous/anxious and does not have insomnia.    Follow Up Instructions:  Plan-continue Wellbutrin 150 mg daily.  Refill sent.   Disposition-follow-up in 1 month.  I discussed the assessment and treatment plan with the patient. The patient was provided an opportunity to ask questions and all were answered. The patient agreed with the plan and demonstrated an understanding of the instructions.   The patient was advised to call back or seek an in-person evaluation if the symptoms worsen or if the condition fails to improve as anticipated.  I provided 10 minutes of non-face-to-face time during this encounter.   Mauro Kaufmann, NP

## 2020-05-22 ENCOUNTER — Inpatient Hospital Stay: Payer: 59

## 2020-05-22 ENCOUNTER — Other Ambulatory Visit: Payer: Self-pay

## 2020-05-22 ENCOUNTER — Inpatient Hospital Stay: Payer: 59 | Attending: Oncology

## 2020-05-22 DIAGNOSIS — K9589 Other complications of other bariatric procedure: Secondary | ICD-10-CM

## 2020-05-22 DIAGNOSIS — E538 Deficiency of other specified B group vitamins: Secondary | ICD-10-CM

## 2020-05-22 DIAGNOSIS — F17200 Nicotine dependence, unspecified, uncomplicated: Secondary | ICD-10-CM

## 2020-05-22 DIAGNOSIS — D7589 Other specified diseases of blood and blood-forming organs: Secondary | ICD-10-CM

## 2020-05-22 LAB — FOLATE: Folate: 7.9 ng/mL (ref >5.9)

## 2020-05-22 LAB — CBC WITH DIFFERENTIAL/PLATELET
Abs Immature Granulocytes: 0.03 10*3/uL (ref 0.00–0.07)
Basophils Absolute: 0 10*3/uL (ref 0.0–0.1)
Basophils Relative: 1 %
Eosinophils Absolute: 0.2 10*3/uL (ref 0.0–0.5)
Eosinophils Relative: 3 %
HCT: 48.4 % — ABNORMAL HIGH (ref 36.0–46.0)
Hemoglobin: 16.4 g/dL — ABNORMAL HIGH (ref 12.0–15.0)
Immature Granulocytes: 1 %
Lymphocytes Relative: 26 %
Lymphs Abs: 1.6 10*3/uL (ref 0.7–4.0)
MCH: 35.2 pg — ABNORMAL HIGH (ref 26.0–34.0)
MCHC: 33.9 g/dL (ref 30.0–36.0)
MCV: 103.9 fL — ABNORMAL HIGH (ref 80.0–100.0)
Monocytes Absolute: 0.6 10*3/uL (ref 0.1–1.0)
Monocytes Relative: 10 %
Neutro Abs: 3.7 10*3/uL (ref 1.7–7.7)
Neutrophils Relative %: 59 %
Platelets: 241 10*3/uL (ref 150–400)
RBC: 4.66 MIL/uL (ref 3.87–5.11)
RDW: 13.4 % (ref 11.5–15.5)
WBC: 6.1 10*3/uL (ref 4.0–10.5)
nRBC: 0 % (ref 0.0–0.2)

## 2020-05-22 LAB — FERRITIN: Ferritin: 12 ng/mL (ref 11–307)

## 2020-05-22 MED ORDER — CYANOCOBALAMIN 1000 MCG/ML IJ SOLN
1000.0000 ug | Freq: Once | INTRAMUSCULAR | Status: AC
  Administered 2020-05-22: 1000 ug via INTRAMUSCULAR

## 2020-05-22 MED ORDER — CYANOCOBALAMIN 1000 MCG/ML IJ SOLN
INTRAMUSCULAR | Status: AC
  Filled 2020-05-22: qty 1

## 2020-06-16 ENCOUNTER — Inpatient Hospital Stay: Payer: 59 | Admitting: Oncology

## 2020-06-16 ENCOUNTER — Other Ambulatory Visit: Payer: Self-pay

## 2020-06-19 ENCOUNTER — Inpatient Hospital Stay: Payer: 59 | Attending: Oncology

## 2020-06-19 ENCOUNTER — Other Ambulatory Visit: Payer: Self-pay

## 2020-06-19 DIAGNOSIS — E538 Deficiency of other specified B group vitamins: Secondary | ICD-10-CM | POA: Diagnosis not present

## 2020-06-19 DIAGNOSIS — K9589 Other complications of other bariatric procedure: Secondary | ICD-10-CM

## 2020-06-19 MED ORDER — CYANOCOBALAMIN 1000 MCG/ML IJ SOLN
1000.0000 ug | Freq: Once | INTRAMUSCULAR | Status: AC
  Administered 2020-06-19: 1000 ug via INTRAMUSCULAR
  Filled 2020-06-19: qty 1

## 2020-06-29 IMAGING — CT CT ABD-PELV W/O
2 of 4 series · 16 of 46 positions shown, 18 images · non-contrast
Comparison: None.

CLINICAL DATA: Right flank pain for 1 week. Microscopic hematuria.
Stone disease suspected.

EXAM:
CT ABDOMEN AND PELVIS WITHOUT CONTRAST
TECHNIQUE: Multidetector CT imaging of the abdomen and pelvis was performed
following the standard protocol without IV contrast.

[Series 2: renal stone · axial · 0.76mm/px · z∈[-1890,-1470]mm · 13 of 92 slices shown, 15 images (1 of 2)]
[im 4/92  soft-tissue]
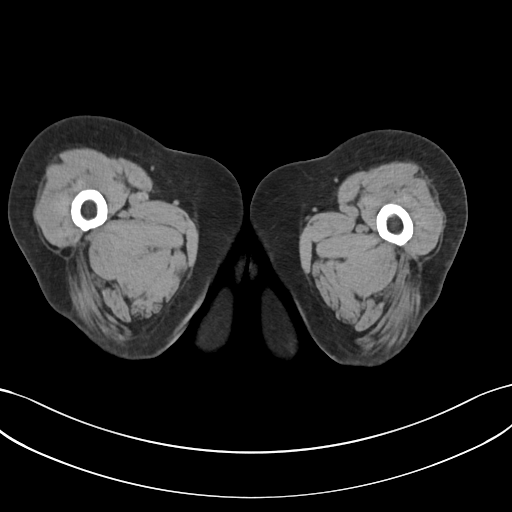
[im 4/92  bone]
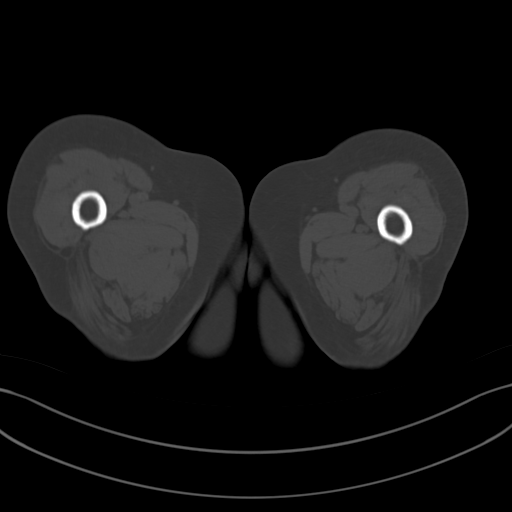
[im 12/92  soft-tissue]
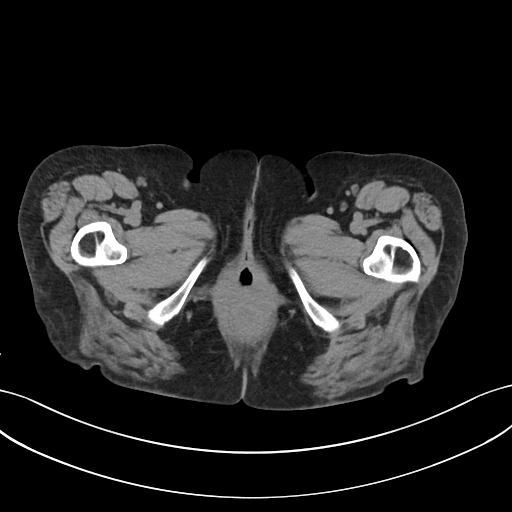
[im 19/92  soft-tissue]
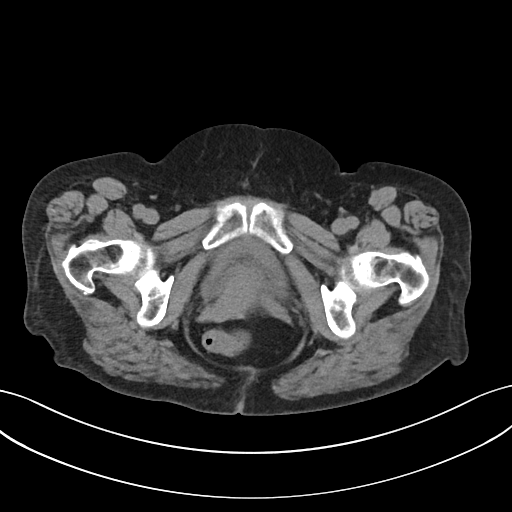
[im 27/92  soft-tissue]
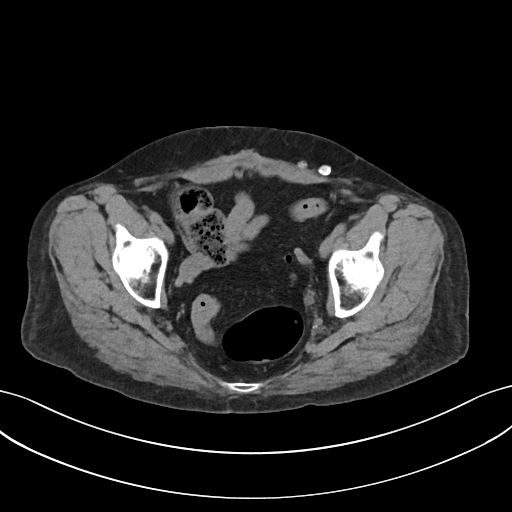
[im 31/92  soft-tissue]
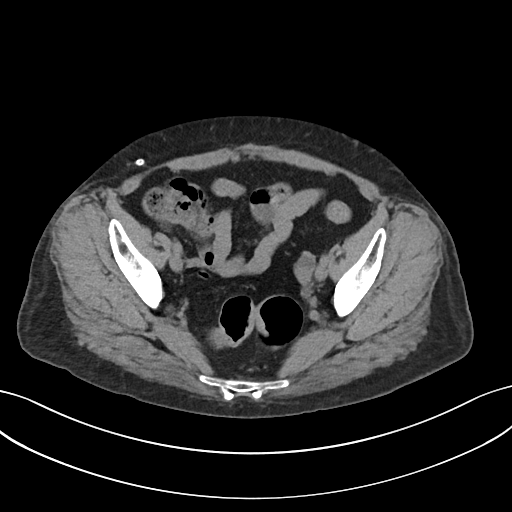
[im 38/92  soft-tissue]
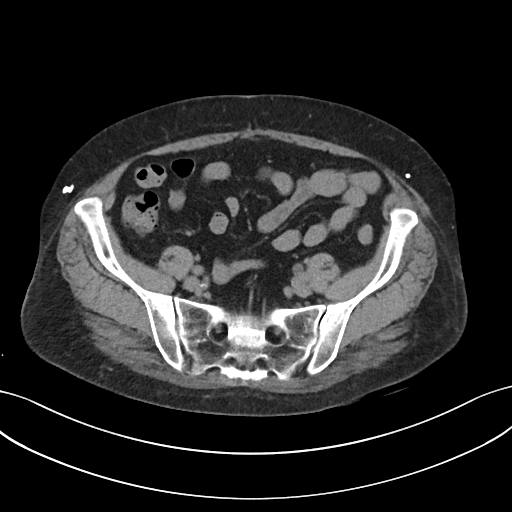
[im 46/92  soft-tissue]
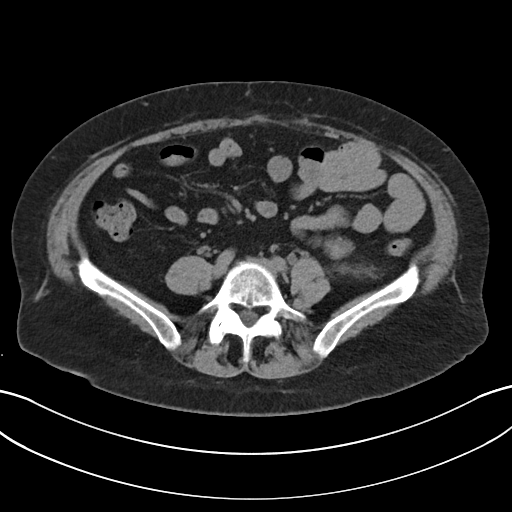
[im 54/92  soft-tissue]
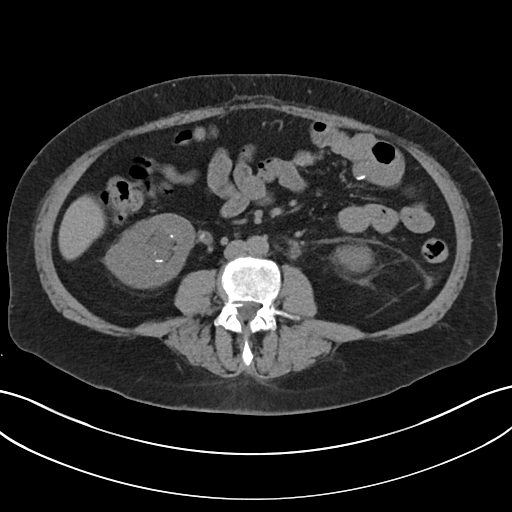
[im 61/92  soft-tissue]
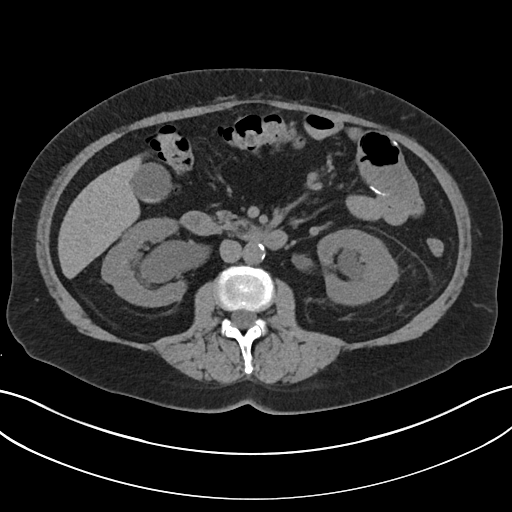
[im 61/92  bone]
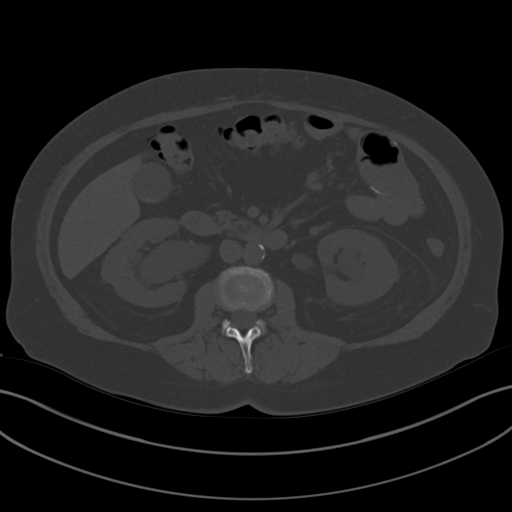
[im 65/92  soft-tissue]
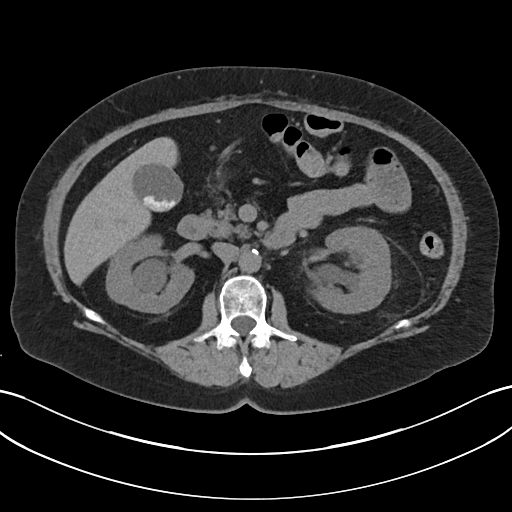
[im 73/92  soft-tissue]
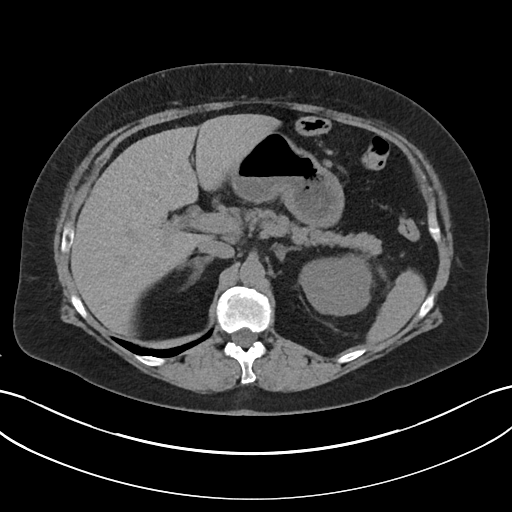
[im 80/92  soft-tissue]
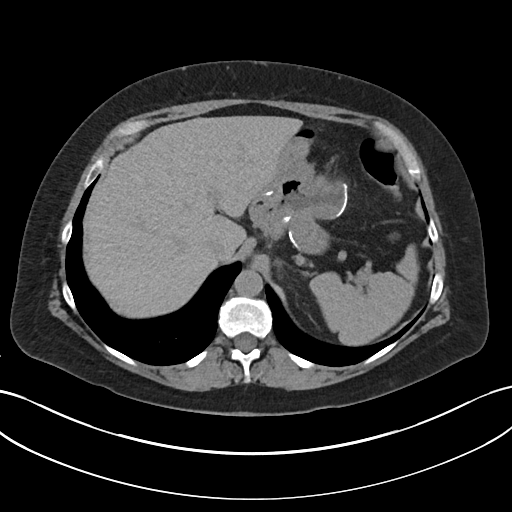
[im 88/92  soft-tissue]
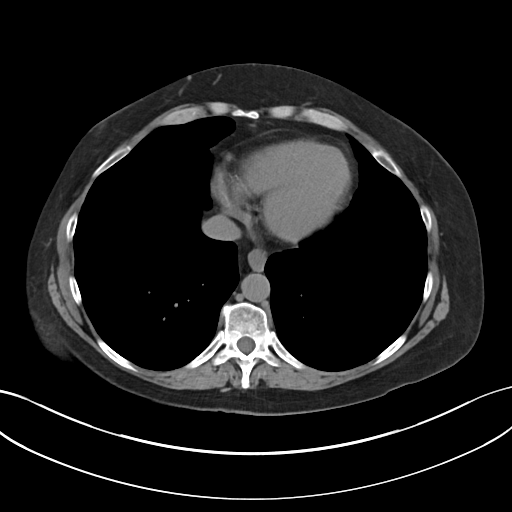

[Series 4: renal stone · coronal · 0.74mm/px · 3 of 144 slices shown (2 of 2)]
[im 48/144  soft-tissue]
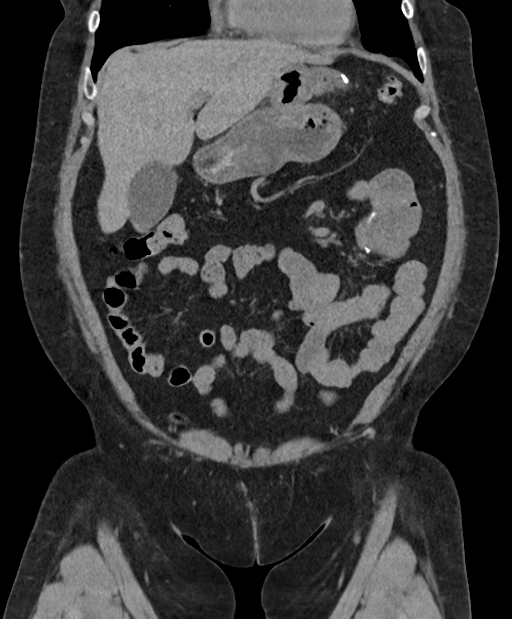
[im 64/144  soft-tissue]
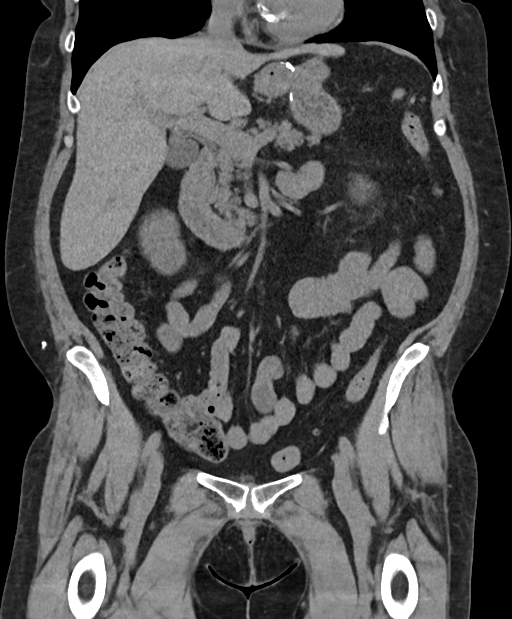
[im 80/144  soft-tissue]
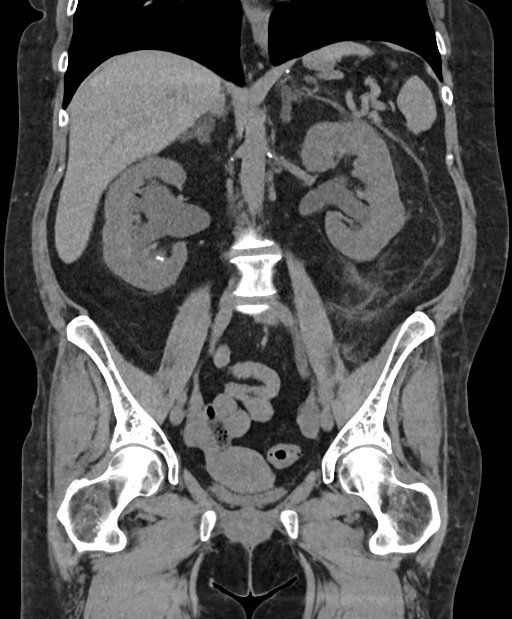

[16 of 46 positions shown; findings below may reference images not displayed]

FINDINGS: Lower chest: No acute findings.

Hepatobiliary: No mass visualized on this unenhanced exam. Multiple
tiny gallstones are seen, however there is no evidence of
cholecystitis or biliary ductal dilatation.

Pancreas: No mass or inflammatory process visualized on this
unenhanced exam.

Spleen:  Within normal limits in size.

Adrenals/Urinary tract: Normal adrenal glands. Several tiny less
than 5 mm renal calculi are seen bilaterally. Moderate to severe
right hydronephrosis is seen due to a 10 mm calculus at the right
UPJ. Moderate left hydronephrosis is seen due to a 4 mm calculus in
the distal left ureter and a 3 mm calculus at the left UVJ.

Stomach/Bowel: Previous gastric bypass surgery noted. Diverticulosis
is seen mainly involving the sigmoid colon, however there is no
evidence of diverticulitis. No evidence of obstruction, inflammatory
process, or abnormal fluid collections.

Vascular/Lymphatic: No pathologically enlarged lymph nodes
identified. No evidence of abdominal aortic aneurysm. Aortic
atherosclerosis.

Reproductive:  No mass or other significant abnormality.

Other:  None.

Musculoskeletal:  No suspicious bone lesions identified.
IMPRESSION: 1. Moderate to severe right hydronephrosis due to 10 mm calculus at
the right UPJ.
2. Moderate left hydronephrosis due to 4 mm calculus in distal left
ureter and 3 mm calculus at left UVJ.
3. Tiny bilateral intrarenal calculi.
4. Cholelithiasis. No radiographic evidence of cholecystitis.
5. Colonic diverticulosis, without radiographic evidence of
diverticulitis.

## 2020-07-17 ENCOUNTER — Inpatient Hospital Stay: Payer: 59

## 2020-07-17 ENCOUNTER — Inpatient Hospital Stay: Payer: 59 | Attending: Hematology and Oncology

## 2020-07-17 ENCOUNTER — Other Ambulatory Visit: Payer: Self-pay

## 2020-07-17 DIAGNOSIS — E538 Deficiency of other specified B group vitamins: Secondary | ICD-10-CM | POA: Insufficient documentation

## 2020-07-17 DIAGNOSIS — D7589 Other specified diseases of blood and blood-forming organs: Secondary | ICD-10-CM

## 2020-07-17 DIAGNOSIS — D508 Other iron deficiency anemias: Secondary | ICD-10-CM | POA: Diagnosis not present

## 2020-07-17 DIAGNOSIS — K9589 Other complications of other bariatric procedure: Secondary | ICD-10-CM

## 2020-07-17 LAB — CBC WITH DIFFERENTIAL/PLATELET
Abs Immature Granulocytes: 0.03 10*3/uL (ref 0.00–0.07)
Basophils Absolute: 0 10*3/uL (ref 0.0–0.1)
Basophils Relative: 1 %
Eosinophils Absolute: 0.2 10*3/uL (ref 0.0–0.5)
Eosinophils Relative: 4 %
HCT: 46.1 % — ABNORMAL HIGH (ref 36.0–46.0)
Hemoglobin: 15.5 g/dL — ABNORMAL HIGH (ref 12.0–15.0)
Immature Granulocytes: 1 %
Lymphocytes Relative: 29 %
Lymphs Abs: 1.4 10*3/uL (ref 0.7–4.0)
MCH: 35.4 pg — ABNORMAL HIGH (ref 26.0–34.0)
MCHC: 33.6 g/dL (ref 30.0–36.0)
MCV: 105.3 fL — ABNORMAL HIGH (ref 80.0–100.0)
Monocytes Absolute: 0.4 10*3/uL (ref 0.1–1.0)
Monocytes Relative: 8 %
Neutro Abs: 3 10*3/uL (ref 1.7–7.7)
Neutrophils Relative %: 57 %
Platelets: 276 10*3/uL (ref 150–400)
RBC: 4.38 MIL/uL (ref 3.87–5.11)
RDW: 13.2 % (ref 11.5–15.5)
WBC: 5.1 10*3/uL (ref 4.0–10.5)
nRBC: 0 % (ref 0.0–0.2)

## 2020-07-17 LAB — FERRITIN: Ferritin: 8 ng/mL — ABNORMAL LOW (ref 11–307)

## 2020-07-17 NOTE — Progress Notes (Signed)
[facility]  [address] [place], [place] [address] Phone: [phone]  Fax: [phone]   Clinic Day: 07/18/20  Referring physician: Excell Seltzer, MD  Chief Complaint: Brenda Hawkins is a 54 y.o. female with s/p gastric bypass surgery with iron deficiency anemia and B12 deficiency who is seen for 3 month assessment.   HPI: Brenda Hawkins was last seen in Brenda hematology clinic on 03/15/2020. At that time, she felt "lazy". She denied any bleeding.  She was trying to quit smoking. Exam was stable. Hematocrit was 44.0, hemoglobin 15.0, platelets 234,000, WBC 6,300. Ferritin was 7.  Decision was made not to supplement iron secondary to her history of erythrocytosis (secondary to smoking and sleep apnea).  She received vitamin B12 injections monthly x 3 (04/17/2020 - 06/19/2020).  Brenda Hawkins saw Durenda Hurt, NP on 04/04/2020 and 04/17/2020 in Brenda smoking cessation clinic.  Labs on 05/22/2020 revealed a hematocrit of 48.4, hemoglobin 16.4, platelets 241,000, WBC 6,100. Ferritin was 12. Folate was 7.9.  During Brenda interim, she has been "here." She is still feeling lazy. She denies bleeding of any kind. Brenda tingling in her fingers is stable. She has not had any problems with kidney stones.  She states that she got down to less than half a pack of cigarettes daily. However, she missed an appointment with Durenda Hurt, NP recently and has gotten back up to almost a full pack. She would like Durenda Hurt, NP to reach out to her again.  She did not undergo sleep apnea testing. She does not feel rested in Brenda mornings but states that she never feels rested.   Past Medical History:  Diagnosis Date  . Anemia    in past  . Anxiety    no longer  . Asthma    seasonal  . Diabetes mellitus type II    no longer  . GERD (gastroesophageal reflux disease)    no longer  . Obesity   . Osteopenia   . Sleep apnea    no longer  . Symptomatic  cholelithiasis 02/05/2019  . 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 Vitamin B12 deficiency    since bariatric surgery    Past Surgical History:  Procedure Laterality Date  . CHOLECYSTECTOMY N/A 02/17/2019   Procedure: LAPAROSCOPIC CHOLECYSTECTOMY - DIABETIC;  Surgeon: Ancil Linsey, MD;  Location: [facility] ORS;  Service: General;  Laterality: N/A;  . CYSTOSCOPY W/ URETERAL STENT PLACEMENT Left 11/11/2018   Procedure: CYSTOSCOPY WITH STENT REMOVAL;  Surgeon: Riki Altes, MD;  Location: [facility] ORS;  Service: Urology;  Laterality: Left;  . CYSTOSCOPY WITH HOLMIUM LASER LITHOTRIPSY Left 10/24/2018   Procedure: CYSTOSCOPY WITH HOLMIUM LASER LITHOTRIPSy;  Surgeon: Crista Elliot, MD;  Location: [facility] ORS;  Service: Urology;  Laterality: Left;  . CYSTOSCOPY WITH STENT PLACEMENT Bilateral 10/24/2018   Procedure: CYSTOSCOPY WITH STENT PLACEMENT;  Surgeon: Crista Elliot, MD;  Location: [facility] OR[facility];  Service: Urology;  Laterality: Bilateral;  . CYSTOSCOPY/URETEROSCOPY/HOLMIUM LASER/STENT PLACEMENT Right 11/11/2018   Procedure: CYSTOSCOPY/URETEROSCOPY/HOLMIUM LASER/STENT EXCHANGE;  Surgeon: Riki Altes, MD;  Location: [facility] OR[facility];  Service: Urology;  Laterality: Right;  . ESOPHAGOGASTRODUODENOSCOPY (EGD) WITH PROPOFOL N/A 06/01/2018   Procedure: ESOPHAGOGASTRODUODENOSCOPY (EGD) WITH PROPOFOL;  Surgeon: Toney Reil, MD;  Location: [facility]OSCOPY;  Service: Gastroenterology;  Laterality: N/A;  . GALLBLADDER SURGERY  02/17/2019  . 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 REDUCTION MAMMAPLASTY Right 1992  . URETEROSCOPY Left 10/24/2018   Procedure: URETEROSCOPY;  Surgeon:  Crista Elliot, MD;  Location: [facility] ORS;  Service: Urology;  Laterality: Left;    Family History  Problem Relation Age of Onset  . Heart disease Mother        valve replacement surgery  . Coronary artery disease Father   . Breast cancer Neg Hx     Social History:  reports that she has been smoking. She has a 55.50 pack-year smoking history. She has  never used smokeless tobacco. She reports that she does not drink alcohol and does not use drugs. She quit smoking >20 years ago, but started smoking again 3-4 years ago.Smoking 2 packs a day, which is decreased. She would like to try and quit smoking this year (2020).She has a dog named Doctor, general practice.Brenda Hawkins lives in [place].Herhusband passed away from pancreatic cancer around 2016.  She has a 57-month-old old Shitzu puppy named Flossie.  Brenda Hawkins is alone today.  Allergies:  Allergies  Allergen Reactions  . Codeine Sulfate Other (See Comments)    Reaction: "violently ill"  . Penicillins Other (See Comments)    Did it involve swelling of Brenda face/tongue/throat, SOB, or low BP? Unknown Did it involve sudden or severe rash/hives, skin peeling, or any reaction on Brenda inside of your mouth or nose? Unknown Did you need to seek medical attention at a hospital or doctor's office? Unknown When did it last happen?Childhood allergy If all above answers are "NO", may proceed with cephalosporin use.   . Adhesive [Tape] Itching and Rash    PAPER TAPE IS FINE  . Latex Itching and Rash    Current Medications: Current Outpatient Medications  Medication Sig Dispense Refill  . atorvastatin (LIPITOR) 10 MG tablet Take 1 tablet (10 mg total) by mouth daily. 90 tablet 3  . esomeprazole (NEXIUM) 40 MG capsule Take 1 capsule (40 mg total) by mouth 2 (two) times daily before a meal. 60 capsule 6  . albuterol (VENTOLIN HFA) 108 (90 Base) MCG/ACT inhaler Inhale 2 puffs into Brenda lungs every 6 (six) hours as needed for wheezing or shortness of breath. (Hawkins not taking: Reported on 03/14/2020) 6.7 g 0  . buPROPion (WELLBUTRIN XL) 150 MG 24 hr tablet Take 1 tablet (150 mg total) by mouth daily. (Hawkins not taking: Reported on 07/18/2020) 60 tablet 0  . nicotine (NICODERM CQ) 21 mg/24hr patch Place 1 patch (21 mg total) onto Brenda skin daily. (Hawkins not taking: Reported on 07/18/2020) 28 patch 0   No  current facility-administered medications for this visit.   Facility-Administered Medications Ordered in Other Visits  Medication Dose Route Frequency Provider Last Rate Last Admin  . sodium chloride 0.9 % injection 10 mL  10 mL Intracatheter PRN Jeralyn Ruths, MD        Review of Systems  Constitutional: Negative for chills, diaphoresis, fever, malaise/fatigue and weight loss (up 2 lbs).       "I am here".  HENT: Negative.  Negative for congestion, ear discharge, ear pain, hearing loss, nosebleeds, sinus pain, sore throat and tinnitus.   Eyes: Negative.  Negative for blurred vision and double vision.  Respiratory: Negative.  Negative for cough, shortness of breath and wheezing.   Cardiovascular: Negative for chest pain, claudication, leg swelling and PND.       White coat hypertension.  Gastrointestinal: Negative.  Negative for abdominal pain, blood in stool, constipation, diarrhea, heartburn, melena, nausea and vomiting.  Genitourinary: Negative.  Negative for dysuria, frequency, hematuria and urgency.       Kidney  stones s/p stent, lithotripsy, and stent removal.  Musculoskeletal: Negative.  Negative for back pain, joint pain and myalgias.  Skin: Negative.  Negative for itching and rash.  Neurological: Positive for tingling (fingers). Negative for dizziness, sensory change, focal weakness, weakness and headaches.  Endo/Heme/Allergies: Negative.  Does not bruise/bleed easily.  Psychiatric/Behavioral: Negative for depression and memory loss. Brenda Hawkins is nervous/anxious. Brenda Hawkins does not have insomnia.        Feeling very lazy.  All other systems reviewed and are negative.  Performance status (ECOG):  1  Vitals Blood pressure (!) 159/90, pulse 80, temperature 99.5 F (37.5 C), temperature source Tympanic, resp. rate 18, height 5' (1.524 m), weight 155 lb 8.6 oz (70.5 kg), SpO2 100 %.   Physical Exam Vitals and nursing note reviewed.  Constitutional:      General: She  is not in acute distress.    Appearance: She is well-developed. She is not diaphoretic.  HENT:     Head: Normocephalic and atraumatic.     Mouth/Throat:     Mouth: Mucous membranes are moist.     Pharynx: Oropharynx is clear.  Eyes:     General: No scleral icterus.    Extraocular Movements: Extraocular movements intact.     Conjunctiva/sclera: Conjunctivae normal.     Pupils: Pupils are equal, round, and reactive to light.     Comments: Glasses.  Blue eyes.  Cardiovascular:     Rate and Rhythm: Normal rate and regular rhythm.     Heart sounds: Normal heart sounds.  Pulmonary:     Effort: Pulmonary effort is normal. No respiratory distress.     Breath sounds: Normal breath sounds. No wheezing or rales.  Chest:     Chest wall: No tenderness.  Abdominal:     General: Bowel sounds are normal. There is no distension.     Palpations: Abdomen is soft. There is no mass.     Tenderness: There is no abdominal tenderness. There is no guarding or rebound.  Musculoskeletal:        General: No swelling or tenderness. Normal range of motion.     Cervical back: Normal range of motion and neck supple.  Lymphadenopathy:     Head:     Right side of head: No preauricular, posterior auricular or occipital adenopathy.     Left side of head: No preauricular, posterior auricular or occipital adenopathy.     Cervical: No cervical adenopathy.     Upper Body:     Right upper body: No supraclavicular or axillary adenopathy.     Left upper body: No supraclavicular adenopathy.     Lower Body: No right inguinal adenopathy. No left inguinal adenopathy.  Skin:    General: Skin is warm and dry.  Neurological:     Mental Status: She is alert and oriented to person, place, and time.  Psychiatric:        Behavior: Behavior normal.        Thought Content: Thought content normal.        Judgment: Judgment normal.    Appointment on 07/17/2020  Component Date Value Ref Range Status  . Ferritin 07/17/2020 8*  11 - 307 ng/mL Final   Performed at [facility], [address] [place], [place] [address]  . WBC 07/17/2020 5.1  4.0 - 10.5 K/uL Final  . RBC 07/17/2020 4.38  3.87 - 5.11 MIL/uL Final  . Hemoglobin 07/17/2020 15.5* 12.0 - 15.0 g/dL Final  . HCT 60/45/409811/04/2020 46.1*  36 - 46 % Final  . MCV 07/17/2020 105.3* 80.0 - 100.0 fL Final  . MCH 07/17/2020 35.4* 26.0 - 34.0 pg Final  . MCHC 07/17/2020 33.6  30.0 - 36.0 g/dL Final  . RDW 40/04/6760 13.2  11.5 - 15.5 % Final  . Platelets 07/17/2020 276  150 - 400 K/uL Final  . nRBC 07/17/2020 0.0  0.0 - 0.2 % Final  . Neutrophils Relative % 07/17/2020 57  % Final  . Neutro Abs 07/17/2020 3.0  1.7 - 7.7 K/uL Final  . Lymphocytes Relative 07/17/2020 29  % Final  . Lymphs Abs 07/17/2020 1.4  0.7 - 4.0 K/uL Final  . Monocytes Relative 07/17/2020 8  % Final  . Monocytes Absolute 07/17/2020 0.4  0.1 - 1.0 K/uL Final  . Eosinophils Relative 07/17/2020 4  % Final  . Eosinophils Absolute 07/17/2020 0.2  0.0 - 0.5 K/uL Final  . Basophils Relative 07/17/2020 1  % Final  . Basophils Absolute 07/17/2020 0.0  0.0 - 0.1 K/uL Final  . Immature Granulocytes 07/17/2020 1  % Final  . Abs Immature Granulocytes 07/17/2020 0.03  0.00 - 0.07 K/uL Final   Performed at [facility], [address] [place], [place] [address]    Assessment:  Jacie Tristan is a 54 y.o. female with s/p gastric bypass surgeryin 03/2002 and subsequent development of iron deficiency anemiaand B12 deficiency. She is intolerant of oral iron. She denies any melena or hematochezia. Stool guaiacs were negative on 01/02/2015. Dietis poor. She denies any melena or hematochezia. She has never had a colonoscopy. Hawkins refuses colonoscopy.  She has B12 deficiency. B12 was 172 on 06/10/2014 and 250 on 12/31/2016. She receives B12 injections (began 01/16/2015; last 06/19/2020). Folate was 7.9 on 05/22/2020.  She has iron deficiency. She has symptoms of ice pica and  restless legs. She received Venoferx4 (01/16/2015 - 02/07/2015), 10/10/2015, 05/06/2017, x 3 (05/08/2017 - 05/22/2017), and x3 (02/09/2018 - 02/23/2018).   Ferritinhas been followed: 1.5 on 09/19/2014, 59 on 02/07/2015, 10 on 03/16/2015, 6 on 06/16/2015, 5 on 10/09/2015, 5 on 01/01/2016, 6 on 04/01/2016, 14 on 07/08/2016, 13 on 08/06/2016, 5 on 05/07/2017, 35 on 08/08/2017, 6 on 11/04/2017, 7 on 02/03/2018, 19 on 05/05/2018, 16 on 08/04/2018, 111 on 11/02/2018, 160 on 11/30/2018, 97 on 12/28/2018, 155 on 03/08/2019, 14 on 06/28/2019, 13 on 09/17/2019, 9 on 12/13/2019, 7 on 03/14/2020, 12 on 05/22/2020, and 8 on 07/17/2020.  EGDon 06/01/2018 revealed patent Roux-en-Y gastrojejunostomy characterized by an intact staple line, visible sutures, congestion, erythema, friable mucosa and ulceration. There was a large clean based gastrojejunal anastomotic ulcer. There was a normal duodenal bulb and second portion of Brenda duodenum. There was bleeding erosive gastropathytreated with bipolar cautery. There was normal gastroesophageal junction and esophagus. Biopsies were negative for malignancy. There was no H pylori. She is on Nexium40 mg BID.  Abdomen and pelvic CTon 10/23/2018 revealed moderate to severe right hydronephrosisdue to 10 mm calculus at Brenda right UPJ. There was moderateleft hydronephrosisdue to 4 mm calculus in distal left ureter and 3 mm calculus at left UVJ. There was tiny bilateral intrarenal calculi. There was cholelithiasis and colonic diverticulosis. She underwent bilateral stentplacement on 10/24/2018.  She underwent cystoscopy, right ureteroscopy and stone removal, ureteroscopic laser lithotripsy, right ureteral stent placement, left ureteral stent removal, and right retrograde pyelography on 11/11/2018. She underwent right stent removal on 11/17/2018.   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 She denies any liver disease. Thyroid studies have  been  normal in Brenda past. Labs on 10/30/2017revealed Brenda following normal labs: folate (11.4), TSH (1.563), and reticulocyte count (1.7%). Folate was 6.5 on 08/08/2017. TSH was normal on 09/08/2017 and 05/05/2018.  She has a history of sleep apnea. She previously used CPAP when she was heavier. She is not using CPAP now.   She smokes 1 1/2 packs per day. She has an increasing hematocrit despite low iron stores suggestive of secondary erythrocytosisdue to smoking, sleep apnea, or a myeloproliferative disorder.  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 Work-upon 04/30/2020revealed carbon monoxide11.8% (0-3.6%), erythropoietin 19.3(2.6-18.5). [id number], exons 12-15,CALR, and MPL werenegative.  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 She was taking over 50 BC powders a week for chronic headaches. She had tinnitus. Salicylate level was 39.6 (2.8-30). She was hydrated in Brenda ER on 07/09/2016. Follow-up salicylate level was 16.3.  Bilateral mammogramon 05/29/2017 revealed calcifications and possible asymmetry in Brenda right breast. Left breast was unremarkable. Diagnostic mammogram and ultrasound of Brenda right breast were ordered, but not performed. Hawkins refuses further breast imaging.  Low dose chest CT for lung cancer screening on 01/04/2020 revealed Lung-RADS 1S, negative.  She underwent a cholecystectomyon 02/17/2019.  Brenda Hawkins does not plan to receive Brenda COVID-19 vaccine.  Symptomatically, she never feels rested. She denies any bleeding.  She is smoking 1 ppd.  She did not undergo sleep apnea testing. Exam is stable.  Plan: 1.   Review labs from 07/17/2020. 2.Iron deficiency s/p gastric bypass Hematocrit 44.0.  Hemoglobin 15.0.  MCV 105.0 on 03/14/2020.   Ferritin 7.  Hematocrit46.1. Hemoglobin 15.5. MCV105.3 on 07/17/2020.  Ferritin8.  She has a drive to make RBCs secondary to her smoking and  secondary erythrocytosis.  As she is not anemic, postpone Venofer.    Continue to monitor closely. 3. B12 deficiency Hawkins last received B12 on 06/19/2020.  B12 today and monthly x6.  Check folate annually (due 05/2021). 4. Macrocytic RBC indices Hematocrit 46.1.  Hemoglobin 15.5.  MCV 105.3 today.  Etiology unclear. She has no known liver disease or myelodysplastic syndrome(MDS). B12 deficiency treated.  Folate normal. TSH was normal on 09/09/2019. Discussed plan to pursue bone marrow if counts decline to rule out MDS. 5.Erythrocytosis Hematocrit 46.1.  Hemoglobin 15.5 today. Hemoglobinin Brenda past has rangedbetween 16.4-17.2. Work-up on 01/07/2019 revealed Brenda following labs: Erythropoietin 19.3(2.6-18.5).  JAK2 V617F, exons 12-15,CALR, and MPL werenegative.  Carbon monoxide11.8% (0-3.6%). Etiology is secondary to smoking and sleep apnea.                         Hawkins smoking 1 pack a day.   Encourage smoking cessation and follow-up in Brenda smoking cessation clinic.             Hawkins not interested in sleep apnea testing. 6. Health maintenance Low-dose chest CT on 01/04/2020 was negative.  Continue annual imaging. 7.   Contact [place] re: smoking cessation clinic. 8.   B12 today and monthly x 6.  9.   RTC in 3 months for labs (CBC, ferritin, folate) and B12. 10.   RTC in 6 months for MD assess and labs (CBC with diff, ferritin-day before), B12 and +/- Venofer.  I discussed Brenda assessment and treatment plan with Brenda Hawkins.  Brenda Hawkins was provided an opportunity to ask questions and all were answered.  Brenda Hawkins agreed with Brenda plan and demonstrated an understanding of Brenda instructions.  Brenda Hawkins was advised to call back if Brenda symptoms worsen or  if  Brenda condition fails to improve as anticipated.  I provided 16 minutes of face-to-face time during this this encounter and > 50% was spent counseling as documented under my assessment and plan.  Rosey Bath, MD, PhD 07/18/2020, 2:21 PM    I, Pietro Cassis, am acting as a scribe for Dr. Merlene Pulling.  I, Luvern Mcisaac C. Merlene Pulling, MD, have reviewed Brenda above documentation for accuracy and completeness, and I agree with Brenda above.

## 2020-07-18 ENCOUNTER — Inpatient Hospital Stay (HOSPITAL_BASED_OUTPATIENT_CLINIC_OR_DEPARTMENT_OTHER): Payer: 59 | Admitting: Hematology and Oncology

## 2020-07-18 ENCOUNTER — Inpatient Hospital Stay: Payer: 59

## 2020-07-18 ENCOUNTER — Encounter: Payer: Self-pay | Admitting: Hematology and Oncology

## 2020-07-18 ENCOUNTER — Other Ambulatory Visit: Payer: 59

## 2020-07-18 VITALS — BP 159/90 | HR 80 | Temp 99.5°F | Resp 18 | Ht 60.0 in | Wt 155.5 lb

## 2020-07-18 DIAGNOSIS — K9589 Other complications of other bariatric procedure: Secondary | ICD-10-CM | POA: Diagnosis not present

## 2020-07-18 DIAGNOSIS — D7589 Other specified diseases of blood and blood-forming organs: Secondary | ICD-10-CM | POA: Diagnosis not present

## 2020-07-18 DIAGNOSIS — E538 Deficiency of other specified B group vitamins: Secondary | ICD-10-CM

## 2020-07-18 DIAGNOSIS — D508 Other iron deficiency anemias: Secondary | ICD-10-CM

## 2020-07-18 MED ORDER — CYANOCOBALAMIN 1000 MCG/ML IJ SOLN
1000.0000 ug | Freq: Once | INTRAMUSCULAR | Status: AC
  Administered 2020-07-18: 1000 ug via INTRAMUSCULAR

## 2020-07-18 MED ORDER — CYANOCOBALAMIN 1000 MCG/ML IJ SOLN
INTRAMUSCULAR | Status: AC
  Filled 2020-07-18: qty 1

## 2020-07-18 NOTE — Progress Notes (Signed)
No new changes noted today 

## 2020-07-31 ENCOUNTER — Inpatient Hospital Stay (HOSPITAL_BASED_OUTPATIENT_CLINIC_OR_DEPARTMENT_OTHER): Payer: 59 | Admitting: Oncology

## 2020-07-31 DIAGNOSIS — F17210 Nicotine dependence, cigarettes, uncomplicated: Secondary | ICD-10-CM

## 2020-07-31 NOTE — Progress Notes (Signed)
Re: smoking cessation  Called patient to touch base on smoking cessation.  Unable to reach at this time.  Have left a voicemail for return phone call.  Smoking cessation course- 04/04/2020-initial contact.  Started nicotine patch 21 mg daily. 04/17/2020-follow-up visit.  No significant decrease in smoking with nicotine patch.  Stop nicotine patch start Wellbutrin 150 mg daily.  A new prescription for Wellbutrin was sent in on 05/19/2020.  Durenda Hurt, NP 07/31/2020 10:19 AM

## 2020-08-21 ENCOUNTER — Inpatient Hospital Stay: Payer: 59 | Attending: Oncology

## 2020-09-10 IMAGING — CR ABDOMEN - 1 VIEW
1 series · 2 of 2 positions shown · non-contrast
Comparison: CT 11/02/2018 and CT from 11/05/2018

CLINICAL DATA: History of nephrolithiasis.

EXAM:
ABDOMEN - 1 VIEW

[Series 1: dg abd 1 view · 0.14mm/px · 2 of 2 slices shown]
[im 1/2]
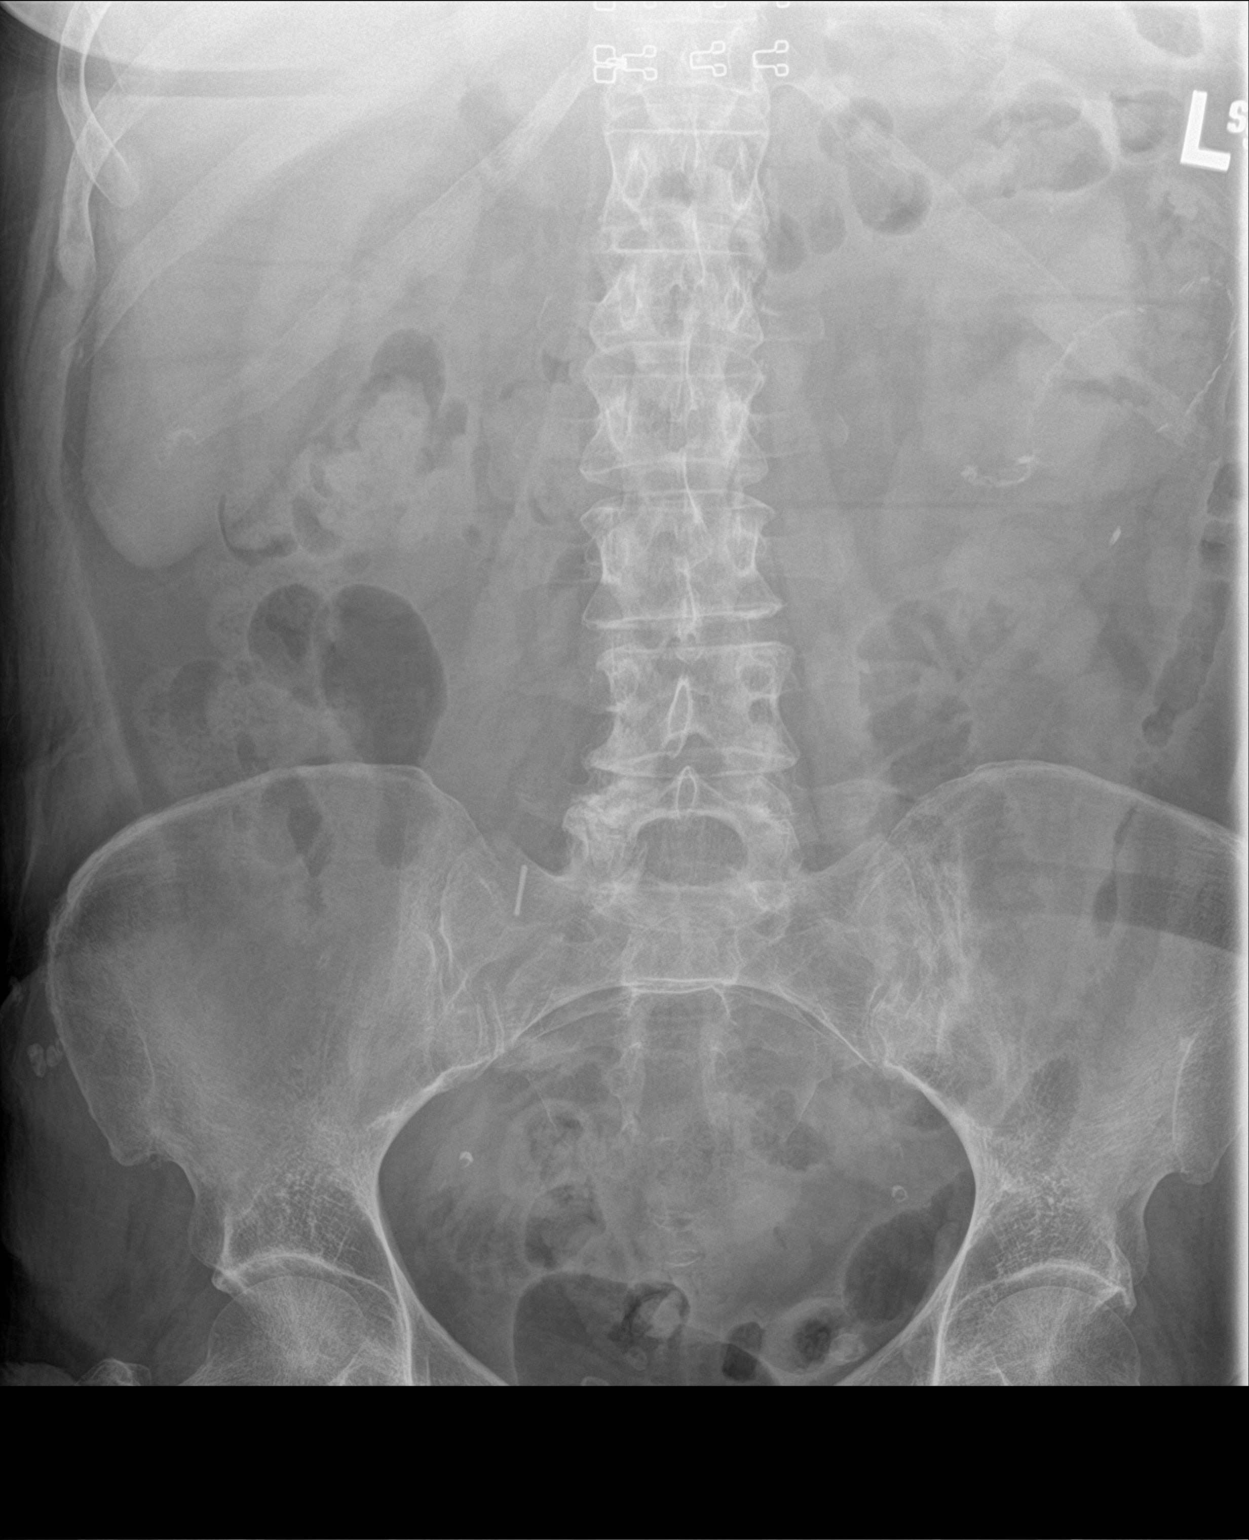
[im 2/2]
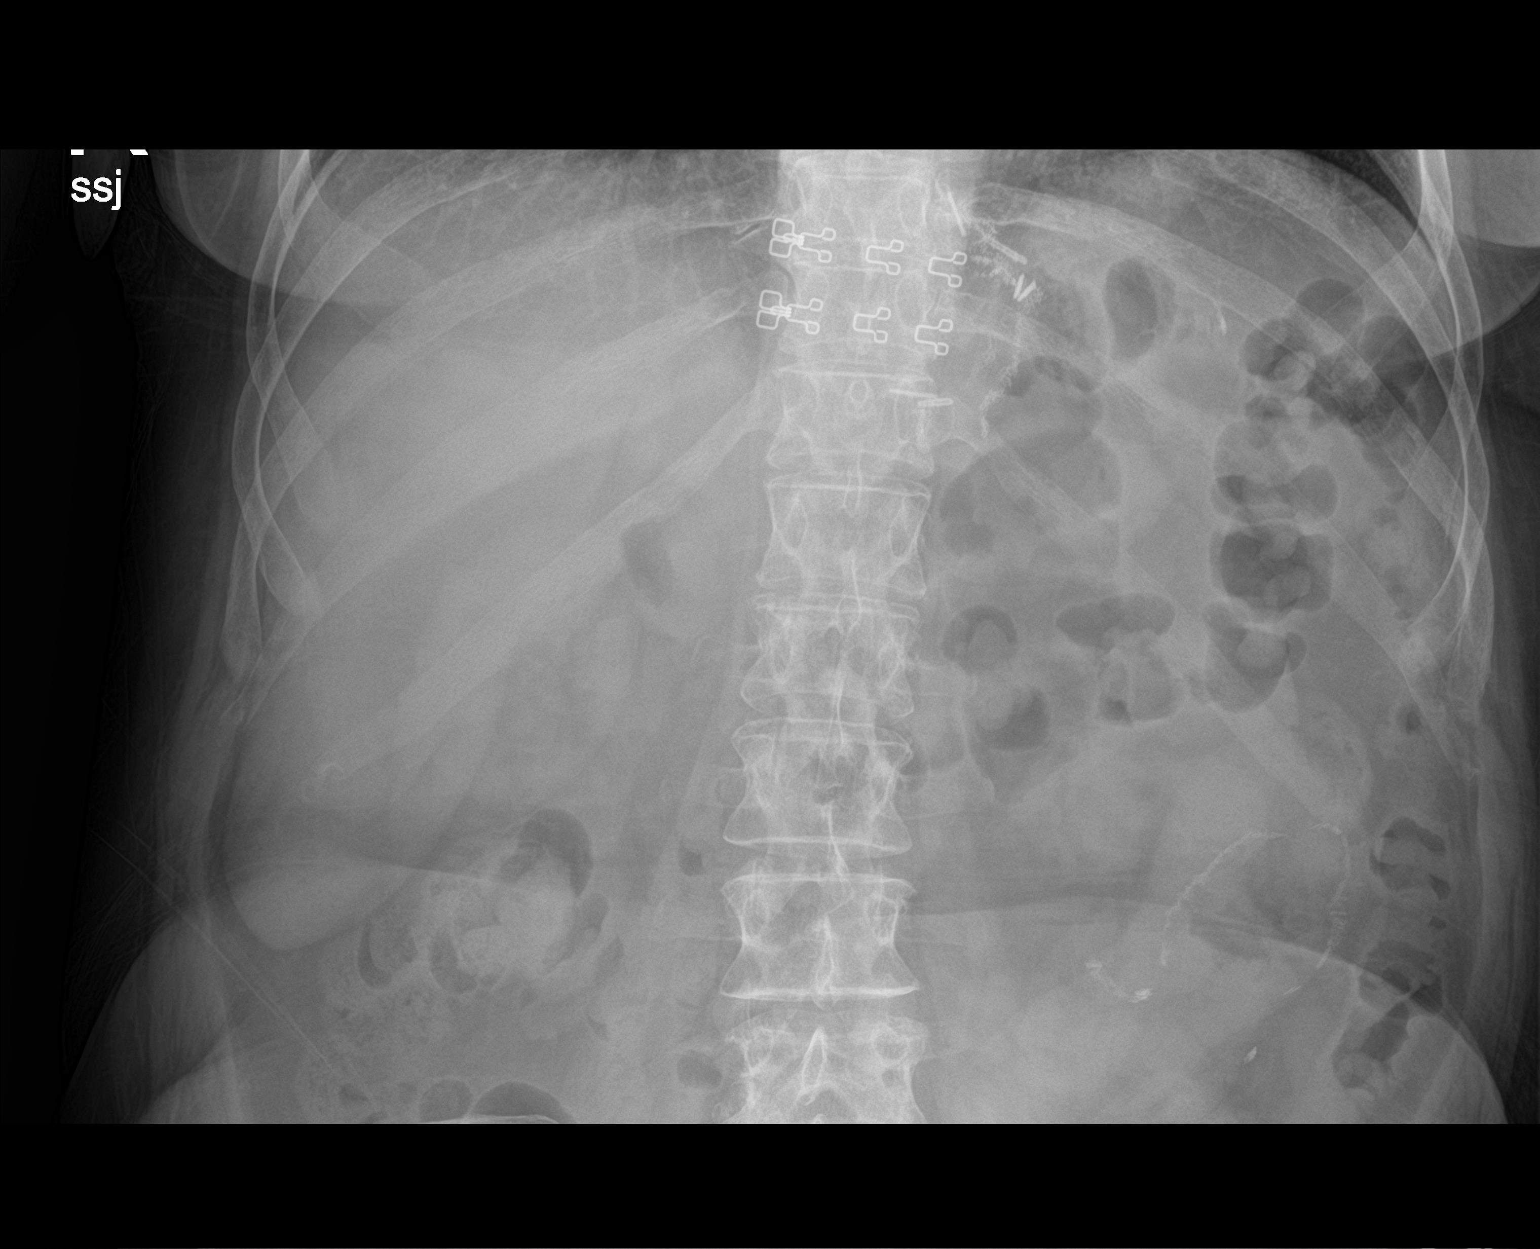

[2 of 2 positions shown; findings below may reference images not displayed]

FINDINGS: Patient has a known small stone in the proximal right ureter. This
small ureter stone is not confidently identified on the radiographs.
No definite calculi seen in the kidneys. Surgical bowel clips in the
left abdomen. Subcutaneous calcifications in the pelvic region.
Nonobstructive bowel gas pattern.
IMPRESSION: Known right ureter stone is not confidently identified on
radiography.

## 2020-09-12 ENCOUNTER — Ambulatory Visit (INDEPENDENT_AMBULATORY_CARE_PROVIDER_SITE_OTHER)
Admission: RE | Admit: 2020-09-12 | Discharge: 2020-09-12 | Disposition: A | Discharge status: Home / Self Care | Payer: 59 | Source: Ambulatory Visit | Attending: Family Medicine | Admitting: Family Medicine

## 2020-09-12 ENCOUNTER — Encounter: Payer: Self-pay | Admitting: Family Medicine

## 2020-09-12 ENCOUNTER — Ambulatory Visit (INDEPENDENT_AMBULATORY_CARE_PROVIDER_SITE_OTHER): Payer: 59 | Admitting: Family Medicine

## 2020-09-12 VITALS — BP 178/90 | HR 97 | Temp 97.8°F | Ht 60.3 in | Wt 154.2 lb

## 2020-09-12 DIAGNOSIS — Z1159 Encounter for screening for other viral diseases: Secondary | ICD-10-CM

## 2020-09-12 DIAGNOSIS — I10 Essential (primary) hypertension: Secondary | ICD-10-CM | POA: Diagnosis not present

## 2020-09-12 DIAGNOSIS — L02211 Cutaneous abscess of abdominal wall: Secondary | ICD-10-CM | POA: Diagnosis not present

## 2020-09-12 DIAGNOSIS — J4540 Moderate persistent asthma, uncomplicated: Secondary | ICD-10-CM

## 2020-09-12 DIAGNOSIS — Z9884 Bariatric surgery status: Secondary | ICD-10-CM

## 2020-09-12 DIAGNOSIS — K2100 Gastro-esophageal reflux disease with esophagitis, without bleeding: Secondary | ICD-10-CM

## 2020-09-12 DIAGNOSIS — E7800 Pure hypercholesterolemia, unspecified: Secondary | ICD-10-CM

## 2020-09-12 DIAGNOSIS — M5442 Lumbago with sciatica, left side: Secondary | ICD-10-CM | POA: Diagnosis not present

## 2020-09-12 DIAGNOSIS — R7303 Prediabetes: Secondary | ICD-10-CM

## 2020-09-12 DIAGNOSIS — D508 Other iron deficiency anemias: Secondary | ICD-10-CM

## 2020-09-12 DIAGNOSIS — J4520 Mild intermittent asthma, uncomplicated: Secondary | ICD-10-CM

## 2020-09-12 DIAGNOSIS — K9589 Other complications of other bariatric procedure: Secondary | ICD-10-CM

## 2020-09-12 DIAGNOSIS — Z0000 Encounter for general adult medical examination without abnormal findings: Secondary | ICD-10-CM | POA: Diagnosis not present

## 2020-09-12 DIAGNOSIS — I700 Atherosclerosis of aorta: Secondary | ICD-10-CM

## 2020-09-12 LAB — COMPREHENSIVE METABOLIC PANEL
ALT: 12 U/L (ref 0–35)
AST: 15 U/L (ref 0–37)
Albumin: 4.3 g/dL (ref 3.5–5.2)
Alkaline Phosphatase: 64 U/L (ref 39–117)
BUN: 20 mg/dL (ref 6–23)
CO2: 26 mEq/L (ref 19–32)
Calcium: 9.1 mg/dL (ref 8.4–10.5)
Chloride: 106 mEq/L (ref 96–112)
Creatinine, Ser: 0.67 mg/dL (ref 0.40–1.20)
GFR: 98.1 mL/min (ref >60.00)
Glucose, Bld: 83 mg/dL (ref 70–99)
Potassium: 3.9 mEq/L (ref 3.5–5.1)
Sodium: 141 mEq/L (ref 135–145)
Total Bilirubin: 0.2 mg/dL (ref 0.2–1.2)
Total Protein: 7.1 g/dL (ref 6.0–8.3)

## 2020-09-12 LAB — VITAMIN D 25 HYDROXY (VIT D DEFICIENCY, FRACTURES): VITD: 10.69 ng/mL — ABNORMAL LOW (ref 30.00–100.00)

## 2020-09-12 LAB — LDL CHOLESTEROL, DIRECT: Direct LDL: 164 mg/dL

## 2020-09-12 LAB — LIPID PANEL
Cholesterol: 246 mg/dL — ABNORMAL HIGH (ref 0–200)
HDL: 36.4 mg/dL — ABNORMAL LOW (ref >39.00)
NonHDL: 209.15
Total CHOL/HDL Ratio: 7
Triglycerides: 231 mg/dL — ABNORMAL HIGH (ref 0.0–149.0)
VLDL: 46.2 mg/dL — ABNORMAL HIGH (ref 0.0–40.0)

## 2020-09-12 LAB — HEMOGLOBIN A1C: Hgb A1c MFr Bld: 6.9 % — ABNORMAL HIGH (ref 4.6–6.5)

## 2020-09-12 MED ORDER — MUPIROCIN 2 % EX OINT: 1.0000 "application " | TOPICAL_OINTMENT | Freq: Two times a day (BID) | CUTANEOUS | 0 refills | Status: AC

## 2020-09-12 MED ORDER — PREDNISONE 20 MG PO TABS: ORAL_TABLET | ORAL | 0 refills | Status: DC

## 2020-09-12 NOTE — Patient Instructions (Addendum)
We will call with X-ray results.  Start low back exercise 2-3 times daily.  Complete 10 day course of prednisone. Can use tylenol as well for pain after course completed.  Please stop at the lab to have labs drawn. Apply topical antibiotic to healing areas in lower abdomen. Call if you change your mind about setting up preventative services: vaccines ( tetanus, COVID  And Flu vaccines), pap smear, colonoscopy or mammogram. Check blood pressure daily.Marland Kitchen goal is  < 140/90.  Bring in Bp measurements to next OV in 2 weeks.

## 2020-09-12 NOTE — Progress Notes (Addendum)
Patient ID: Brenda Hawkins, female    DOB: 01-29-1966, 55 y.o.   MRN: [id number]  This visit was conducted in person.  There were no vitals taken for this visit.   CC: annual physical with additional new concerns Subjective:   HPI: Brenda Hawkins is a 55 y.o. female presenting on 09/12/2020 The patient presents for annual complete physical and review of chronic health problems. He/She also has the following acute concerns today:  1. New onset x 3-4 months left buttock pain radiating to left knee and ankle. No proceeding fall but had [place] the beach for an extended period flat right before pain started. She tried CBD oil which helped a little with the left low back pain but she still has left leg pain. Worse with bending.  Her left knee is numb to the foot and she has noted weakness of left toes, feels like dragging foot some.   She has no history of back issues or back surgeries.  2. New onset 1 month ago lesion under pannus.. raised red pustule, drained but then had second area appear.  She washed area and treated with peroxide.  Now lesions are  No longer tender, still dark red  And intermittently oozing yellow substance  3. Blood pressure elevated today in office... not on any medication. She thought the cholesterol med was for blood pressure   review of chronic health issues:  1. Elevated Cholesterol: Due for re-eval  Hx of AA.Marland Kitchen on statin Using medications without problems: no SE Muscle aches: none Diet compliance: moderate Exercise: minimal given back issue Other complaints:  2. Iron deficiency anemia and polycythemia followed by Dr. Rogue Bussing, last OV note reviewed, next OV next week. Scheduled for monthly B12 injections. Has upcoming cbc and B12 check.  3. GERD, S/P gastric bypass, hx/o PUD: Stable on nexium, has tried to stop but cannot without return of symptoms. NSAIDs contraindicated. Poor absorption.. needs B12 and vit d to be followed.  4. Asthma, mild  intermittent: stable control  Using albuterol prn.  Current smoker, precontemplative to quit smoking.  5. Prediabetes: due for re-eval of A1C  6. Hx of HTN: B{P elevated at last severeal OV.  She feels it is white coat HTN but not measuring regularly at home.  Was on lisinopril HCTZ  In past.. not sure why stopped. BP Readings from Last 3 Encounters:  09/12/20 (!) 178/90  07/18/20 (!) 159/90  03/15/20 (!) 161/79       Relevant past medical, surgical, family and social history reviewed and updated as indicated. Interim medical history since our last visit reviewed. Allergies and medications reviewed and updated. Outpatient Medications Prior to Visit  Medication Sig Dispense Refill  . albuterol (VENTOLIN HFA) 108 (90 Base) MCG/ACT inhaler Inhale 2 puffs into the lungs every 6 (six) hours as needed for wheezing or shortness of breath. (Patient not taking: Reported on 03/14/2020) 6.7 g 0  . atorvastatin (LIPITOR) 10 MG tablet Take 1 tablet (10 mg total) by mouth daily. 90 tablet 3  . buPROPion (WELLBUTRIN XL) 150 MG 24 hr tablet Take 1 tablet (150 mg total) by mouth daily. (Patient not taking: Reported on 07/18/2020) 60 tablet 0  . esomeprazole (NEXIUM) 40 MG capsule Take 1 capsule (40 mg total) by mouth 2 (two) times daily before a meal. 60 capsule 6  . nicotine (NICODERM CQ) 21 mg/24hr patch Place 1 patch (21 mg total) onto the skin daily. (Patient not taking: Reported on 07/18/2020) 28 patch  0   Facility-Administered Medications Prior to Visit  Medication Dose Route Frequency Provider Last Rate Last Admin  . sodium chloride 0.9 % injection 10 mL  10 mL Intracatheter PRN Lloyd Huger, MD         Per HPI unless specifically indicated in ROS section below Review of Systems  Constitutional: Negative for fatigue and fever.  HENT: Negative for congestion.   Eyes: Negative for pain.  Respiratory: Negative for cough and shortness of breath.   Cardiovascular: Negative for chest pain,  palpitations and leg swelling.  Gastrointestinal: Negative for abdominal pain.  Genitourinary: Negative for dysuria and vaginal bleeding.  Musculoskeletal: Positive for back pain.  Skin: Positive for rash.  Neurological: Negative for syncope, light-headedness and headaches.  Psychiatric/Behavioral: Negative for dysphoric mood.   Objective:  There were no vitals taken for this visit.  Wt Readings from Last 3 Encounters:  07/18/20 155 lb 8.6 oz (70.5 kg)  03/15/20 153 lb 1.8 oz (69.4 kg)  03/10/20 153 lb (69.4 kg)      Physical Exam Constitutional:      General: She is not in acute distress.Vital signs are normal.     Appearance: Normal appearance. She is well-developed and well-nourished. She is not ill-appearing or toxic-appearing.  HENT:     Head: Normocephalic.     Right Ear: Hearing, tympanic membrane, ear canal and external ear normal.     Left Ear: Hearing, tympanic membrane, ear canal and external ear normal.     Nose: Nose normal.  Eyes:     General: Lids are normal. Lids are everted, no foreign bodies appreciated.     Extraocular Movements: EOM normal.     Conjunctiva/sclera: Conjunctivae normal.     Pupils: Pupils are equal, round, and reactive to light.  Neck:     Thyroid: No thyroid mass or thyromegaly.     Vascular: No carotid bruit.     Trachea: Trachea normal.  Cardiovascular:     Rate and Rhythm: Normal rate and regular rhythm.     Pulses: Intact distal pulses.     Heart sounds: Normal heart sounds, S1 normal and S2 normal. No murmur heard. No gallop.   Pulmonary:     Effort: Pulmonary effort is normal. No respiratory distress.     Breath sounds: Normal breath sounds. No wheezing, rhonchi or rales.  Abdominal:     General: Bowel sounds are normal. There is no distension or abdominal bruit.     Palpations: Abdomen is soft. There is no fluid wave, hepatosplenomegaly or mass.     Tenderness: There is no abdominal tenderness. There is no CVA tenderness,  guarding or rebound.     Hernia: No hernia is present.  Musculoskeletal:     Cervical back: Normal, normal range of motion and neck supple.     Thoracic back: Normal.     Lumbar back: Tenderness present. No bony tenderness. Decreased range of motion. Positive right straight leg raise test. Negative left straight leg raise test.  Lymphadenopathy:     Cervical: No cervical adenopathy.     Upper Body:  No axillary adenopathy present. Skin:    General: Skin is warm, dry and intact.     Findings: No rash.     Comments: 2 erythematous lesions under pannus, no fluctuance, no pustule, no drainage. Areas appear to be drying out. No pain and no heat  Neurological:     Mental Status: She is alert.     Cranial Nerves: No cranial  nerve deficit.     Sensory: No sensory deficit.     Deep Tendon Reflexes: Strength normal.  Psychiatric:        Mood and Affect: Mood is not anxious or depressed.        Speech: Speech normal.        Behavior: Behavior normal. Behavior is cooperative.        Cognition and Memory: Cognition and memory normal.        Judgment: Judgment normal.       Results for orders placed or performed in visit on 07/17/20  Ferritin  Result Value Ref Range   Ferritin 8 (L) 11 - 307 ng/mL  CBC with Differential  Result Value Ref Range   WBC 5.1 4.0 - 10.5 K/uL   RBC 4.38 3.87 - 5.11 MIL/uL   Hemoglobin 15.5 (H) 12.0 - 15.0 g/dL   HCT 17.5 (H) 10.2 - 58.5 %   MCV 105.3 (H) 80.0 - 100.0 fL   MCH 35.4 (H) 26.0 - 34.0 pg   MCHC 33.6 30.0 - 36.0 g/dL   RDW 27.7 82.4 - 23.5 %   Platelets 276 150 - 400 K/uL   nRBC 0.0 0.0 - 0.2 %   Neutrophils Relative % 57 %   Neutro Abs 3.0 1.7 - 7.7 K/uL   Lymphocytes Relative 29 %   Lymphs Abs 1.4 0.7 - 4.0 K/uL   Monocytes Relative 8 %   Monocytes Absolute 0.4 0.1 - 1.0 K/uL   Eosinophils Relative 4 %   Eosinophils Absolute 0.2 0.0 - 0.5 K/uL   Basophils Relative 1 %   Basophils Absolute 0.0 0.0 - 0.1 K/uL   Immature Granulocytes 1 %    Abs Immature Granulocytes 0.03 0.00 - 0.07 K/uL    This visit occurred during the SARS-CoV-2 public health emergency.  Safety protocols were in place, including screening questions prior to the visit, additional usage of staff PPE, and extensive cleaning of exam room while observing appropriate contact time as indicated for disinfecting solutions.   COVID 19 screen:  No recent travel or known exposure to COVID19 The patient denies respiratory symptoms of COVID 19 at this time. The importance of social distancing was discussed today.   Assessment and Plan The patient's preventative maintenance and recommended screening tests for an annual wellness exam were reviewed in full today. Brought up to date unless services declined.  Counselled on the importance of diet, exercise, and its role in overall health and mortality. The patient's FH and SH was reviewed, including their home life, tobacco status, and drug and alcohol status.   Patient refused all below noted preventative recommendations despite need. This was risk and benefits reviewed in detail with patient. She voiced understanding. Vaccines: refused flu, PNA, TDap and COVID vaccine despite need.. discussed in detail. Pap/DVE:  refused Mammo: refused Bone Density:refused Colon: refused  Smoking Status: current, refused discussion of cessation ETOH/ drug use: none/none  Hep C:  Will check with labs.   Problem List Items Addressed This Visit    Abdominal wall abscess     New, improving but not resolved.  Start topical mupirocin on lesions, keep area clean and dry.  Will follow up  At next OV in 2 weeks, call sooner if worsening.      Acute left-sided low back pain with left-sided sciatica    New,  eval with X-ray.  Likely DD, herniated disc as etiology. No weakness on exam today but subjective numbness and weakness in right  leg. NSAIDs contraindicated. Treat with 10 day course of prednsione.  Start home PT.. info packet  provided.  follow up in 2 weeks.      Relevant Medications   predniSONE (DELTASONE) 20 MG tablet   Other Relevant Orders   DG Lumbar Spine Complete (Completed)   Aortic atherosclerosis (HCC)    Continue statin for risk factor control. Encouraged smoking cessation.. pt refused despite risk.      Essential hypertension, benign     Poor control . She feels there is a white coat HTN element. She will measure ont trustworthy arm cuff at home and if elevated we will discuss restarting lisinopril/HCTZ at next OV.      Relevant Orders   Hemoglobin A1c (Completed)   GERD    Stable, chronic.  Continue current medication.  Nexium 40 mg twice daily  Hx of PUD  and gastritis      HYPERCHOLESTEROLEMIA    Due for re-eval on statin.  Continue lipitor 10 mg daily      Relevant Orders   Lipid panel (Completed)   Comprehensive metabolic panel (Completed)   Iron deficiency anemia following bariatric surgery     Followed by Dr. Rogue Bussing. Last OV reviewed. Upcoming repeat B12 and cbc pending.      Mild intermittent asthma    Stable, chronic.  Continue current medication.   Albuterol 2 puffs prn wheeze.      Relevant Medications   predniSONE (DELTASONE) 20 MG tablet   Prediabetes    Due for re-eval.       Other Visit Diagnoses    Routine general medical examination at a health care facility    -  Primary   Moderate persistent asthma without complication       Relevant Medications   predniSONE (DELTASONE) 20 MG tablet   History of gastric bypass       Relevant Orders   VITAMIN D 25 Hydroxy (Vit-D Deficiency, Fractures) (Completed)   Need for hepatitis C screening test       Relevant Orders   Hepatitis C antibody     Meds ordered this encounter  Medications  . mupirocin ointment (BACTROBAN) 2 %    Sig: Apply 1 application topically 2 (two) times daily for 7 days.    Dispense:  14 g    Refill:  0  . predniSONE (DELTASONE) 20 MG tablet    Sig: 3 tabs by mouth daily x 5  days, then 2 tabs by mouth daily x 3 days then 1 tab by mouth daily x 2 days    Dispense:  23 tablet    Refill:  0        Kerby Nora, MD

## 2020-09-13 DIAGNOSIS — L02211 Cutaneous abscess of abdominal wall: Secondary | ICD-10-CM | POA: Insufficient documentation

## 2020-09-13 DIAGNOSIS — G8929 Other chronic pain: Secondary | ICD-10-CM | POA: Insufficient documentation

## 2020-09-13 DIAGNOSIS — M5442 Lumbago with sciatica, left side: Secondary | ICD-10-CM | POA: Insufficient documentation

## 2020-09-13 LAB — HEPATITIS C ANTIBODY
Hepatitis C Ab: NONREACTIVE
SIGNAL TO CUT-OFF: 0.01 (ref <1.00)

## 2020-09-13 NOTE — Assessment & Plan Note (Addendum)
New,  eval with X-ray.  Likely DD, herniated disc as etiology. No weakness on exam today but subjective numbness and weakness in right leg. NSAIDs contraindicated. Treat with 10 day course of prednsione.  Start home PT.. info packet provided.  follow up in 2 weeks.

## 2020-09-13 NOTE — Assessment & Plan Note (Signed)
Continue statin for risk factor control. Encouraged smoking cessation.. pt refused despite risk.

## 2020-09-13 NOTE — Assessment & Plan Note (Signed)
Stable, chronic.  Continue current medication.  Nexium 40 mg twice daily  Hx of PUD  and gastritis

## 2020-09-13 NOTE — Assessment & Plan Note (Signed)
Followed by Dr. Rogue Bussing. Last OV reviewed. Upcoming repeat B12 and cbc pending.

## 2020-09-13 NOTE — Assessment & Plan Note (Signed)
Poor control . She feels there is a white coat HTN element. She will measure ont trustworthy arm cuff at home and if elevated we will discuss restarting lisinopril/HCTZ at next OV.

## 2020-09-13 NOTE — Assessment & Plan Note (Addendum)
New, improving but not resolved.  Start topical mupirocin on lesions, keep area clean and dry.  Will follow up  At next OV in 2 weeks, call sooner if worsening.

## 2020-09-13 NOTE — Assessment & Plan Note (Signed)
Stable, chronic.  Continue current medication.   Albuterol 2 puffs prn wheeze.

## 2020-09-13 NOTE — Assessment & Plan Note (Signed)
Due for re-eval on statin.  Continue lipitor 10 mg daily

## 2020-09-13 NOTE — Assessment & Plan Note (Signed)
Due for re-eval. 

## 2020-09-18 ENCOUNTER — Other Ambulatory Visit: Payer: Self-pay

## 2020-09-18 ENCOUNTER — Inpatient Hospital Stay: Payer: 59 | Attending: Oncology

## 2020-09-18 DIAGNOSIS — D508 Other iron deficiency anemias: Secondary | ICD-10-CM

## 2020-09-18 DIAGNOSIS — Z79899 Other long term (current) drug therapy: Secondary | ICD-10-CM | POA: Insufficient documentation

## 2020-09-18 DIAGNOSIS — E538 Deficiency of other specified B group vitamins: Secondary | ICD-10-CM | POA: Insufficient documentation

## 2020-09-18 MED ORDER — CYANOCOBALAMIN 1000 MCG/ML IJ SOLN
INTRAMUSCULAR | Status: AC
  Filled 2020-09-18: qty 1

## 2020-09-18 MED ORDER — CYANOCOBALAMIN 1000 MCG/ML IJ SOLN
1000.0000 ug | Freq: Once | INTRAMUSCULAR | Status: AC
  Administered 2020-09-18: 1000 ug via INTRAMUSCULAR

## 2020-10-03 ENCOUNTER — Other Ambulatory Visit: Payer: Self-pay

## 2020-10-03 ENCOUNTER — Ambulatory Visit: Payer: 59 | Admitting: Family Medicine

## 2020-10-03 ENCOUNTER — Encounter: Payer: Self-pay | Admitting: Family Medicine

## 2020-10-03 VITALS — Ht 60.0 in

## 2020-10-03 DIAGNOSIS — Z0000 Encounter for general adult medical examination without abnormal findings: Secondary | ICD-10-CM

## 2020-10-05 NOTE — Progress Notes (Signed)
Cancelled.  

## 2020-10-06 ENCOUNTER — Ambulatory Visit: Payer: 59 | Admitting: Family Medicine

## 2020-10-12 ENCOUNTER — Other Ambulatory Visit: Payer: Self-pay

## 2020-10-12 DIAGNOSIS — D508 Other iron deficiency anemias: Secondary | ICD-10-CM

## 2020-10-12 DIAGNOSIS — K9589 Other complications of other bariatric procedure: Secondary | ICD-10-CM

## 2020-10-16 ENCOUNTER — Inpatient Hospital Stay: Payer: 59 | Attending: Hematology and Oncology

## 2020-10-16 ENCOUNTER — Inpatient Hospital Stay: Payer: 59

## 2020-10-16 ENCOUNTER — Other Ambulatory Visit: Payer: Self-pay

## 2020-10-16 DIAGNOSIS — E538 Deficiency of other specified B group vitamins: Secondary | ICD-10-CM | POA: Insufficient documentation

## 2020-10-16 DIAGNOSIS — K9589 Other complications of other bariatric procedure: Secondary | ICD-10-CM

## 2020-10-16 DIAGNOSIS — D508 Other iron deficiency anemias: Secondary | ICD-10-CM

## 2020-10-16 LAB — CBC WITH DIFFERENTIAL/PLATELET
Abs Immature Granulocytes: 0.06 10*3/uL (ref 0.00–0.07)
Basophils Absolute: 0 10*3/uL (ref 0.0–0.1)
Basophils Relative: 1 %
Eosinophils Absolute: 0 10*3/uL (ref 0.0–0.5)
Eosinophils Relative: 1 %
HCT: 45 % (ref 36.0–46.0)
Hemoglobin: 14.8 g/dL (ref 12.0–15.0)
Immature Granulocytes: 1 %
Lymphocytes Relative: 19 %
Lymphs Abs: 1.2 10*3/uL (ref 0.7–4.0)
MCH: 33.3 pg (ref 26.0–34.0)
MCHC: 32.9 g/dL (ref 30.0–36.0)
MCV: 101.1 fL — ABNORMAL HIGH (ref 80.0–100.0)
Monocytes Absolute: 0.5 10*3/uL (ref 0.1–1.0)
Monocytes Relative: 7 %
Neutro Abs: 4.8 10*3/uL (ref 1.7–7.7)
Neutrophils Relative %: 71 %
Platelets: 264 10*3/uL (ref 150–400)
RBC: 4.45 MIL/uL (ref 3.87–5.11)
RDW: 14.9 % (ref 11.5–15.5)
WBC: 6.6 10*3/uL (ref 4.0–10.5)
nRBC: 0 % (ref 0.0–0.2)

## 2020-10-16 LAB — FERRITIN: Ferritin: 17 ng/mL (ref 11–307)

## 2020-10-16 LAB — FOLATE: Folate: 9.4 ng/mL (ref >5.9)

## 2020-10-16 MED ORDER — CYANOCOBALAMIN 1000 MCG/ML IJ SOLN
1000.0000 ug | Freq: Once | INTRAMUSCULAR | Status: AC
  Administered 2020-10-16: 1000 ug via INTRAMUSCULAR
  Filled 2020-10-16: qty 1

## 2020-10-17 ENCOUNTER — Encounter: Payer: Self-pay | Admitting: Family Medicine

## 2020-10-17 ENCOUNTER — Other Ambulatory Visit: Payer: Self-pay

## 2020-10-17 ENCOUNTER — Ambulatory Visit (INDEPENDENT_AMBULATORY_CARE_PROVIDER_SITE_OTHER): Payer: 59 | Admitting: Family Medicine

## 2020-10-17 VITALS — BP 140/84 | HR 88 | Temp 97.7°F | Ht 60.0 in

## 2020-10-17 DIAGNOSIS — L02211 Cutaneous abscess of abdominal wall: Secondary | ICD-10-CM | POA: Diagnosis not present

## 2020-10-17 DIAGNOSIS — M5442 Lumbago with sciatica, left side: Secondary | ICD-10-CM

## 2020-10-17 DIAGNOSIS — M21372 Foot drop, left foot: Secondary | ICD-10-CM

## 2020-10-17 DIAGNOSIS — I10 Essential (primary) hypertension: Secondary | ICD-10-CM

## 2020-10-17 DIAGNOSIS — M62562 Muscle wasting and atrophy, not elsewhere classified, left lower leg: Secondary | ICD-10-CM | POA: Diagnosis not present

## 2020-10-17 MED ORDER — PREDNISONE 20 MG PO TABS: ORAL_TABLET | ORAL | 0 refills | Status: DC

## 2020-10-17 NOTE — Assessment & Plan Note (Signed)
Improved control with pain controlled. Continue to follow at home.. will call if remaining persistently > 140/90.

## 2020-10-17 NOTE — Patient Instructions (Addendum)
Repeat prednisone taper. Continue home PT. If weakness not improving at home.. call for follow up and consideration of MRI to evaluate left foot weakness and atrophy.  Apply Tegaderm to lower abdominal wound.

## 2020-10-17 NOTE — Assessment & Plan Note (Signed)
Pain improved but continue weakness  In left lower leg.  Will repeat couse of prednisone and continue home PT as pt feel stretgth is improving.  Offered formal PT referral.. pt declined at this time.  Given left leg atrophy and weakness  In toe flexiona nd extension.Marland Kitchen offered MRI to eval for structural issu causing issue.. but she would like to hold off on for now  She will contact me if weakness and pain not continuing to improve.

## 2020-10-17 NOTE — Progress Notes (Signed)
Patient ID: Brenda Hawkins, female    DOB: 11/24/1965, 55 y.o.   MRN: [id number]  This visit was conducted in person.  BP 140/84   Pulse 88   Temp 97.7 F (36.5 C) (Temporal)   Ht 5' (1.524 m)   SpO2 97%   BMI 30.12 kg/m    CC:  Chief Complaint  Patient presents with  . Follow-up    Infected Pannus, Sciatica and HTN    Subjective:   HPI: Brenda Hawkins is a 55 y.o. female presenting on 10/17/2020 for Follow-up (Infected Pannus, Sciatica and HTN)  Hypertension:   At last OV elevated.. felt possible white coat HTN. Followed at home: 130-140/80.Marland Kitchen better when not in as much pain.  BP Readings from Last 3 Encounters:  10/17/20 140/84  09/12/20 (!) 178/90  07/18/20 (!) 159/90  Using medication without problems or lightheadedness:  none Chest pain with exertion: none Edema:none  Short of breath:none Average home BPs:  Other issues:    Sciatica left sided:S/P home PT and prednisone course.  She reports improvement of pain in left leg and buttock... occ pain in left buttocks, but much less frequent and less severe.. Now walking without an limp.Marland Kitchen if she pushes it she still feels weakness and left ankle feels weak. Still issue with  strength in toe flexion and extension.  Feels rock under left ball of foot. Not needing anything for pain.  Lumbar X-ray: Moderate degenerative disc disease is noted at L3-4 and L4-5.   No procedures or surgeries on back in past.    Abdominal wall abscess:  Was improving at last OV 1 month ago, but not resolved. Treated with topical mupirocin.  She notes that it seems to be better one day and worse later.. still intermittent drainage.. white and crusty.  No fever. No abdominal pain      Relevant past medical, surgical, family and social history reviewed and updated as indicated. Interim medical history since our last visit reviewed. Allergies and medications reviewed and updated. Outpatient Medications Prior to Visit  Medication Sig  Dispense Refill  . albuterol (VENTOLIN HFA) 108 (90 Base) MCG/ACT inhaler Inhale 2 puffs into the lungs every 6 (six) hours as needed for wheezing or shortness of breath. 6.7 g 0  . atorvastatin (LIPITOR) 10 MG tablet Take 1 tablet (10 mg total) by mouth daily. 90 tablet 3  . esomeprazole (NEXIUM) 40 MG capsule Take 1 capsule (40 mg total) by mouth 2 (two) times daily before a meal. 60 capsule 6  . predniSONE (DELTASONE) 20 MG tablet 3 tabs by mouth daily x 5 days, then 2 tabs by mouth daily x 3 days then 1 tab by mouth daily x 2 days 23 tablet 0   Facility-Administered Medications Prior to Visit  Medication Dose Route Frequency Provider Last Rate Last Admin  . sodium chloride 0.9 % injection 10 mL  10 mL Intracatheter PRN Jeralyn Ruths, MD         Per HPI unless specifically indicated in ROS section below Review of Systems  Constitutional: Negative for fatigue and fever.  HENT: Negative for congestion.   Eyes: Negative for pain.  Respiratory: Negative for cough and shortness of breath.   Cardiovascular: Negative for chest pain, palpitations and leg swelling.  Gastrointestinal: Negative for abdominal pain.  Genitourinary: Negative for dysuria and vaginal bleeding.  Musculoskeletal: Positive for back pain.  Skin: Positive for wound.  Neurological: Positive for weakness. Negative for syncope, light-headedness and headaches.  Psychiatric/Behavioral: Negative for dysphoric mood.   Objective:  BP 140/84   Pulse 88   Temp 97.7 F (36.5 C) (Temporal)   Ht 5' (1.524 m)   SpO2 97%   BMI 30.12 kg/m   Wt Readings from Last 3 Encounters:  09/12/20 154 lb 4 oz (70 kg)  07/18/20 155 lb 8.6 oz (70.5 kg)  03/15/20 153 lb 1.8 oz (69.4 kg)      Physical Exam Constitutional:      General: She is not in acute distress.Vital signs are normal.     Appearance: Normal appearance. She is well-developed and well-nourished. She is not ill-appearing or toxic-appearing.  HENT:     Head:  Normocephalic.     Right Ear: Hearing, tympanic membrane, ear canal and external ear normal. Tympanic membrane is not erythematous, retracted or bulging.     Left Ear: Hearing, tympanic membrane, ear canal and external ear normal. Tympanic membrane is not erythematous, retracted or bulging.     Nose: No mucosal edema or rhinorrhea.     Right Sinus: No maxillary sinus tenderness or frontal sinus tenderness.     Left Sinus: No maxillary sinus tenderness or frontal sinus tenderness.     Mouth/Throat:     Mouth: Oropharynx is clear and moist and mucous membranes are normal.     Pharynx: Uvula midline.  Eyes:     General: Lids are normal. Lids are everted, no foreign bodies appreciated.     Extraocular Movements: EOM normal.     Conjunctiva/sclera: Conjunctivae normal.     Pupils: Pupils are equal, round, and reactive to light.  Neck:     Thyroid: No thyroid mass or thyromegaly.     Vascular: No carotid bruit.     Trachea: Trachea normal.  Cardiovascular:     Rate and Rhythm: Normal rate and regular rhythm.     Pulses: Normal pulses and intact distal pulses.     Heart sounds: Normal heart sounds, S1 normal and S2 normal. No murmur heard. No friction rub. No gallop.   Pulmonary:     Effort: Pulmonary effort is normal. No tachypnea or respiratory distress.     Breath sounds: Normal breath sounds. No decreased breath sounds, wheezing, rhonchi or rales.  Abdominal:     General: Bowel sounds are normal.     Palpations: Abdomen is soft.     Tenderness: There is no abdominal tenderness.  Musculoskeletal:     Cervical back: Normal, normal range of motion and neck supple.     Thoracic back: Normal.     Lumbar back: Tenderness present. No bony tenderness. Normal range of motion. Negative right straight leg raise test and negative left straight leg raise test.     Comments: ttp in left buttock at sciatic nothc  Skin:    General: Skin is warm, dry and intact.     Findings: No rash.   Neurological:     Mental Status: She is alert.     Sensory: Sensory deficit present.     Motor: Weakness present.     Comments: Left calf atrophy Numbness in toes in left foot 0/5 toe flexion and extension  5/5 ankle  Dorsi flexion and extension strength  5/5 otherwise in left extremity  5/5 in right ext throughout   Psychiatric:        Mood and Affect: Mood is not anxious or depressed.        Speech: Speech normal.        Behavior: Behavior  normal. Behavior is cooperative.        Thought Content: Thought content normal.        Cognition and Memory: Cognition and memory normal.        Judgment: Judgment normal.       Results for orders placed or performed in visit on 10/16/20  Folate  Result Value Ref Range   Folate 9.4 >5.9 ng/mL  CBC with Differential  Result Value Ref Range   WBC 6.6 4.0 - 10.5 K/uL   RBC 4.45 3.87 - 5.11 MIL/uL   Hemoglobin 14.8 12.0 - 15.0 g/dL   HCT 27.7 41.2 - 87.8 %   MCV 101.1 (H) 80.0 - 100.0 fL   MCH 33.3 26.0 - 34.0 pg   MCHC 32.9 30.0 - 36.0 g/dL   RDW 67.6 72.0 - 94.7 %   Platelets 264 150 - 400 K/uL   nRBC 0.0 0.0 - 0.2 %   Neutrophils Relative % 71 %   Neutro Abs 4.8 1.7 - 7.7 K/uL   Lymphocytes Relative 19 %   Lymphs Abs 1.2 0.7 - 4.0 K/uL   Monocytes Relative 7 %   Monocytes Absolute 0.5 0.1 - 1.0 K/uL   Eosinophils Relative 1 %   Eosinophils Absolute 0.0 0.0 - 0.5 K/uL   Basophils Relative 1 %   Basophils Absolute 0.0 0.0 - 0.1 K/uL   Immature Granulocytes 1 %   Abs Immature Granulocytes 0.06 0.00 - 0.07 K/uL  Ferritin  Result Value Ref Range   Ferritin 17 11 - 307 ng/mL    This visit occurred during the SARS-CoV-2 public health emergency.  Safety protocols were in place, including screening questions prior to the visit, additional usage of staff PPE, and extensive cleaning of exam room while observing appropriate contact time as indicated for disinfecting solutions.   COVID 19 screen:  No recent travel or known exposure to  COVID19 The patient denies respiratory symptoms of COVID 19 at this time. The importance of social distancing was discussed today.   Assessment and Plan Problem List Items Addressed This Visit    Abdominal wall abscess    No current infection.. just poor healing wound under pannus.. now ulcer with small amount of granulation tissue. Apply tegaderm every few days to protect lesion.      Acute left-sided low back pain with left-sided sciatica    Pain improved but continue weakness  In left lower leg.  Will repeat couse of prednisone and continue home PT as pt feel stretgth is improving.  Offered formal PT referral.. pt declined at this time.  Given left leg atrophy and weakness  In toe flexiona nd extension.Marland Kitchen offered MRI to eval for structural issu causing issue.. but she would like to hold off on for now  She will contact me if weakness and pain not continuing to improve.      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 Continue to follow at home.. will call if remaining persistently > 140/90.      Left foot drop     Meds ordered this encounter  Medications  . predniSONE (DELTASONE) 20 MG tablet    Sig: 3 tabs by mouth daily x 3 days, then 2 tabs by mouth daily x 2 days then 1 tab by mouth daily x 2 days    Dispense:  15 tablet  Refill:  0       Kerby Nora, MD

## 2020-10-17 NOTE — Assessment & Plan Note (Signed)
No current infection.. just poor healing wound under pannus.. now ulcer with small amount of granulation tissue. Apply tegaderm every few days to protect lesion.

## 2020-11-13 ENCOUNTER — Inpatient Hospital Stay: Payer: 59 | Attending: Hematology and Oncology

## 2020-11-13 ENCOUNTER — Other Ambulatory Visit: Payer: Self-pay

## 2020-11-13 DIAGNOSIS — E538 Deficiency of other specified B group vitamins: Secondary | ICD-10-CM | POA: Diagnosis not present

## 2020-11-13 DIAGNOSIS — K9589 Other complications of other bariatric procedure: Secondary | ICD-10-CM

## 2020-11-13 MED ORDER — CYANOCOBALAMIN 1000 MCG/ML IJ SOLN
1000.0000 ug | Freq: Once | INTRAMUSCULAR | Status: AC
  Administered 2020-11-13: 1000 ug via INTRAMUSCULAR
  Filled 2020-11-13: qty 1

## 2020-12-08 ENCOUNTER — Telehealth: Payer: Self-pay | Admitting: Licensed Clinical Social Worker

## 2020-12-08 NOTE — Telephone Encounter (Signed)
Left message for patient to call [phone] ask for Shawn to schedule lung screening.  

## 2020-12-11 ENCOUNTER — Other Ambulatory Visit: Payer: Self-pay

## 2020-12-11 ENCOUNTER — Inpatient Hospital Stay: Payer: 59 | Attending: Hematology and Oncology

## 2020-12-11 DIAGNOSIS — D508 Other iron deficiency anemias: Secondary | ICD-10-CM

## 2020-12-11 DIAGNOSIS — E538 Deficiency of other specified B group vitamins: Secondary | ICD-10-CM | POA: Insufficient documentation

## 2020-12-11 MED ORDER — CYANOCOBALAMIN 1000 MCG/ML IJ SOLN
1000.0000 ug | Freq: Once | INTRAMUSCULAR | Status: AC
  Administered 2020-12-11: 1000 ug via INTRAMUSCULAR
  Filled 2020-12-11: qty 1

## 2020-12-11 NOTE — Progress Notes (Signed)
B12 injection administered to left arm, site benign.  Pt tolerated injection well and left the infusion suite stable and ambulatory

## 2020-12-25 ENCOUNTER — Telehealth: Payer: Self-pay | Admitting: *Deleted

## 2020-12-25 NOTE — Telephone Encounter (Signed)
Attempted to call patient to schedule annual lung CT screening.  No answer, voicemail left to return call to [phone] to schedule.    

## 2020-12-28 ENCOUNTER — Telehealth: Payer: Self-pay | Admitting: *Deleted

## 2020-12-28 NOTE — Telephone Encounter (Signed)
Attempted to reach patient via telephone re: scheduling annual lung screening scan. Left voicemail message for patient to call me back. 

## 2021-01-01 ENCOUNTER — Encounter: Payer: Self-pay | Admitting: *Deleted

## 2021-01-03 ENCOUNTER — Other Ambulatory Visit: Payer: Self-pay

## 2021-01-03 DIAGNOSIS — D7589 Other specified diseases of blood and blood-forming organs: Secondary | ICD-10-CM

## 2021-01-03 DIAGNOSIS — K9589 Other complications of other bariatric procedure: Secondary | ICD-10-CM

## 2021-01-04 LAB — HM DIABETES EYE EXAM

## 2021-01-08 ENCOUNTER — Inpatient Hospital Stay: Payer: 59 | Attending: Oncology

## 2021-01-08 DIAGNOSIS — E538 Deficiency of other specified B group vitamins: Secondary | ICD-10-CM | POA: Insufficient documentation

## 2021-01-08 DIAGNOSIS — D7589 Other specified diseases of blood and blood-forming organs: Secondary | ICD-10-CM

## 2021-01-08 DIAGNOSIS — K9589 Other complications of other bariatric procedure: Secondary | ICD-10-CM

## 2021-01-08 LAB — CBC WITH DIFFERENTIAL/PLATELET
Abs Immature Granulocytes: 0.03 10*3/uL (ref 0.00–0.07)
Basophils Absolute: 0.1 10*3/uL (ref 0.0–0.1)
Basophils Relative: 1 %
Eosinophils Absolute: 0.2 10*3/uL (ref 0.0–0.5)
Eosinophils Relative: 4 %
HCT: 43 % (ref 36.0–46.0)
Hemoglobin: 14.4 g/dL (ref 12.0–15.0)
Immature Granulocytes: 1 %
Lymphocytes Relative: 26 %
Lymphs Abs: 1.6 10*3/uL (ref 0.7–4.0)
MCH: 33.7 pg (ref 26.0–34.0)
MCHC: 33.5 g/dL (ref 30.0–36.0)
MCV: 100.7 fL — ABNORMAL HIGH (ref 80.0–100.0)
Monocytes Absolute: 0.4 10*3/uL (ref 0.1–1.0)
Monocytes Relative: 7 %
Neutro Abs: 3.8 10*3/uL (ref 1.7–7.7)
Neutrophils Relative %: 61 %
Platelets: 247 10*3/uL (ref 150–400)
RBC: 4.27 MIL/uL (ref 3.87–5.11)
RDW: 15 % (ref 11.5–15.5)
WBC: 6.1 10*3/uL (ref 4.0–10.5)
nRBC: 0 % (ref 0.0–0.2)

## 2021-01-08 LAB — FERRITIN: Ferritin: 7 ng/mL — ABNORMAL LOW (ref 11–307)

## 2021-01-09 ENCOUNTER — Other Ambulatory Visit: Payer: Self-pay

## 2021-01-09 ENCOUNTER — Inpatient Hospital Stay (HOSPITAL_BASED_OUTPATIENT_CLINIC_OR_DEPARTMENT_OTHER): Payer: 59 | Admitting: Nurse Practitioner

## 2021-01-09 ENCOUNTER — Inpatient Hospital Stay: Payer: 59

## 2021-01-09 ENCOUNTER — Encounter: Payer: Self-pay | Admitting: Nurse Practitioner

## 2021-01-09 VITALS — BP 158/61 | HR 52 | Temp 97.1°F | Resp 16 | Wt 151.2 lb

## 2021-01-09 DIAGNOSIS — E538 Deficiency of other specified B group vitamins: Secondary | ICD-10-CM | POA: Diagnosis not present

## 2021-01-09 DIAGNOSIS — K9589 Other complications of other bariatric procedure: Secondary | ICD-10-CM | POA: Diagnosis not present

## 2021-01-09 DIAGNOSIS — D508 Other iron deficiency anemias: Secondary | ICD-10-CM | POA: Diagnosis not present

## 2021-01-09 MED ORDER — CYANOCOBALAMIN 1000 MCG/ML IJ SOLN
1000.0000 ug | Freq: Once | INTRAMUSCULAR | Status: AC
  Administered 2021-01-09: 1000 ug via INTRAMUSCULAR
  Filled 2021-01-09: qty 1

## 2021-01-09 NOTE — Progress Notes (Signed)
[facility]  [address] [place], [place] [address] Phone: [phone]  Fax: [phone]   Clinic Day: 01/09/21  Referring physician: Excell Seltzer, MD  Chief Complaint: Brenda Hawkins is a 55 y.o. female with s/p gastric bypass surgery with iron deficiency anemia and B12 deficiency who is seen for 3 month assessment.   Hematology History:  Brenda Hawkins is a 55 y.o. female with s/p gastric bypass surgeryin 03/2002 and subsequent development of iron deficiency anemiaand B12 deficiency. She is intolerant of oral iron. She denies any melena or hematochezia. Stool guaiacs were negative on 01/02/2015. Dietis poor. She denies any melena or hematochezia. She has never had a colonoscopy. Patient refuses colonoscopy.  She has B12 deficiency. B12 was 172 on 06/10/2014 and 250 on 12/31/2016. She receives B12 injections (began 01/16/2015; last 06/19/2020). Folate was 7.9 on 05/22/2020.  She has iron deficiency. 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 She received Venoferx4 (01/16/2015 - 02/07/2015), 10/10/2015, 05/06/2017, x 3 (05/08/2017 - 05/22/2017), and x3 (02/09/2018 - 02/23/2018).   Ferritinhas been followed: 1.5 on 09/19/2014, 59 on 02/07/2015, 10 on 03/16/2015, 6 on 06/16/2015, 5 on 10/09/2015, 5 on 01/01/2016, 6 on 04/01/2016, 14 on 07/08/2016, 13 on 08/06/2016, 5 on 05/07/2017, 35 on 08/08/2017, 6 on 11/04/2017, 7 on 02/03/2018, 19 on 05/05/2018, 16 on 08/04/2018, 111 on 11/02/2018, 160 on 11/30/2018, 97 on 12/28/2018, 155 on 03/08/2019, 14 on 06/28/2019, 13 on 09/17/2019, 9 on 12/13/2019, 7 on 03/14/2020, 12 on 05/22/2020, and 8 on 07/17/2020.  EGDon 06/01/2018 revealed patent Roux-en-Y gastrojejunostomy characterized by an intact staple line, visible sutures, congestion, erythema, friable mucosa and ulceration. There was a large clean based gastrojejunal anastomotic ulcer. There was a normal duodenal bulb and second  portion of the duodenum. There was bleeding erosive gastropathytreated with bipolar cautery. There was normal gastroesophageal junction and esophagus. Biopsies were negative for malignancy. There was no H pylori. She is on Nexium40 mg BID.  Abdomen and pelvic CTon 10/23/2018 revealed moderate to severe right hydronephrosisdue to 10 mm calculus at the right UPJ. There was moderateleft hydronephrosisdue to 4 mm calculus in distal left ureter and 3 mm calculus at left UVJ. There was tiny bilateral intrarenal calculi. There was cholelithiasis and colonic diverticulosis. She underwent bilateral stentplacement on 10/24/2018.  She underwent cystoscopy, right ureteroscopy and stone removal, ureteroscopic laser lithotripsy, right ureteral stent placement, left ureteral stent removal, and right retrograde pyelography on 11/11/2018. She underwent right stent removal on 11/17/2018.   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 She denies any liver disease. Thyroid studies have been normal in the past. Labs on 10/30/2017revealed the following normal labs: folate (11.4), TSH (1.563), and reticulocyte count (1.7%). Folate was 6.5 on 08/08/2017. TSH was normal on 09/08/2017 and 05/05/2018.  She has a history of sleep apnea. She previously used CPAP when she was heavier. She is not using CPAP now.   She smokes 1 1/2 packs per day. She has an increasing hematocrit despite low iron stores suggestive of secondary erythrocytosisdue to smoking, sleep apnea, or a myeloproliferative disorder.  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 Work-upon 04/30/2020revealed carbon monoxide11.8% (0-3.6%), erythropoietin 19.3(2.6-18.5). JAK2V617F, exons 12-15,CALR, and MPL werenegative. Decision was made not to supplement iron secondary to her history of erythrocytosis (secondary to smoking and sleep apnea).  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 She  was taking over 50 BC powders a week for chronic headaches. She had tinnitus. Salicylate level was 39.6 (2.8-30). She  was hydrated in the ER on 07/09/2016. Follow-up salicylate level was 16.3.  Bilateral mammogramon 05/29/2017 revealed calcifications and possible asymmetry in the right breast. Left breast was unremarkable. Diagnostic mammogram and ultrasound of the right breast were ordered, but not performed. Patient refuses further breast imaging.  Low dose chest CT for lung cancer screening on 01/04/2020 revealed Lung-RADS 1S, negative.  She underwent a cholecystectomyon 02/17/2019.  The patient does not plan to receive the COVID-19 vaccine.  HPI: Patient is 55 year old female who returns to clinic for labs and re-evaluation of iron deficiency and b12 anemia secondary to malabsorption s/p gastric bypass surgery. Today she feels at baseline. Has cut back on smoking significantly. No dizziness or falls. No chest pain or shortness of breath. No nausea, vomiting, constipation, or diarrhea. No pica. No leg pain. No urinary complaints. No melena or hematochezia. No further specific complaints today.    Past Medical History:  Diagnosis Date  . Anemia    in past  . Anxiety    no longer  . Asthma    seasonal  . Diabetes mellitus type II    no longer  . GERD (gastroesophageal reflux disease)    no longer  . Obesity   . Osteopenia   . Sleep apnea    no longer  . Symptomatic cholelithiasis 02/05/2019  . 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 Vitamin B12 deficiency    since bariatric surgery    Past Surgical History:  Procedure Laterality Date  . CHOLECYSTECTOMY N/A 02/17/2019   Procedure: LAPAROSCOPIC CHOLECYSTECTOMY - DIABETIC;  Surgeon: Ancil Linsey, MD;  Location: [facility] ORS;  Service: General;  Laterality: N/A;  . CYSTOSCOPY W/ URETERAL STENT PLACEMENT Left 11/11/2018   Procedure: CYSTOSCOPY WITH STENT REMOVAL;  Surgeon: Riki Altes, MD;  Location: [facility] ORS;  Service: Urology;   Laterality: Left;  . CYSTOSCOPY WITH HOLMIUM LASER LITHOTRIPSY Left 10/24/2018   Procedure: CYSTOSCOPY WITH HOLMIUM LASER LITHOTRIPSy;  Surgeon: Crista Elliot, MD;  Location: [facility] ORS;  Service: Urology;  Laterality: Left;  . CYSTOSCOPY WITH STENT PLACEMENT Bilateral 10/24/2018   Procedure: CYSTOSCOPY WITH STENT PLACEMENT;  Surgeon: Crista Elliot, MD;  Location: [facility] ORS;  Service: Urology;  Laterality: Bilateral;  . CYSTOSCOPY/URETEROSCOPY/HOLMIUM LASER/STENT PLACEMENT Right 11/11/2018   Procedure: CYSTOSCOPY/URETEROSCOPY/HOLMIUM LASER/STENT EXCHANGE;  Surgeon: Riki Altes, MD;  Location: [facility] ORS;  Service: Urology;  Laterality: Right;  . ESOPHAGOGASTRODUODENOSCOPY (EGD) WITH PROPOFOL N/A 06/01/2018   Procedure: ESOPHAGOGASTRODUODENOSCOPY (EGD) WITH PROPOFOL;  Surgeon: Toney Reil, MD;  Location: [facility] ENDOSCOPY;  Service: Gastroenterology;  Laterality: N/A;  . GALLBLADDER SURGERY  02/17/2019  . 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 REDUCTION MAMMAPLASTY Right 1992  . URETEROSCOPY Left 10/24/2018   Procedure: URETEROSCOPY;  Surgeon: Crista Elliot, MD;  Location: [facility] ORS;  Service: Urology;  Laterality: Left;    Family History  Problem Relation Age of Onset  . Heart disease Mother        valve replacement surgery  . Coronary artery disease Father   . Breast cancer Neg Hx     Social History:  reports that she has been smoking. She has a 55.50 pack-year smoking history. She has never used smokeless tobacco. She reports that she does not drink alcohol and does not use drugs. She quit smoking >20 years ago, but started smoking again 3-4 years ago.Smoking 2 packs a day, which is decreased. She would like to try and quit smoking this year (2020).She has a dog named Doctor, general practice.The patient lives  in [place].Herhusband passed away from pancreatic cancer around 2016.  She has a 57-month-old old Shitzu puppy named Flossie.    Allergies:  Allergies  Allergen Reactions  . Codeine Sulfate  Other (See Comments)    Reaction: "violently ill"  . Penicillins Other (See Comments)    Did it involve swelling of the face/tongue/throat, SOB, or low BP? Unknown Did it involve sudden or severe rash/hives, skin peeling, or any reaction on the inside of your mouth or nose? Unknown Did you need to seek medical attention at a hospital or doctor's office? Unknown When did it last happen?Childhood allergy If all above answers are "NO", may proceed with cephalosporin use.   . Adhesive [Tape] Itching and Rash    PAPER TAPE IS FINE  . Latex Itching and Rash    Current Medications: Current Outpatient Medications  Medication Sig Dispense Refill  . albuterol (VENTOLIN HFA) 108 (90 Base) MCG/ACT inhaler Inhale 2 puffs into the lungs every 6 (six) hours as needed for wheezing or shortness of breath. 6.7 g 0  . atorvastatin (LIPITOR) 10 MG tablet Take 1 tablet (10 mg total) by mouth daily. 90 tablet 3  . esomeprazole (NEXIUM) 40 MG capsule Take 1 capsule (40 mg total) by mouth 2 (two) times daily before a meal. 60 capsule 6  . predniSONE (DELTASONE) 20 MG tablet 3 tabs by mouth daily x 3 days, then 2 tabs by mouth daily x 2 days then 1 tab by mouth daily x 2 days (Patient not taking: Reported on 01/09/2021) 15 tablet 0   No current facility-administered medications for this visit.   Facility-Administered Medications Ordered in Other Visits  Medication Dose Route Frequency Provider Last Rate Last Admin  . sodium chloride 0.9 % injection 10 mL  10 mL Intracatheter PRN Jeralyn Ruths, MD        Review of Systems   Performance status (ECOG):  1  Vitals Blood pressure (!) 158/61, pulse (!) 52, temperature (!) 97.1 F (36.2 C), resp. rate 16, weight 151 lb 3.8 oz (68.6 kg), SpO2 95 %.   General: Well-developed, well-nourished, no acute distress. Eyes: Pink conjunctiva, anicteric sclera. Lungs: Clear to auscultation bilaterally.  No audible wheezing or coughing Heart: Regular rate and  rhythm.  Abdomen: Soft, nontender, nondistended.  Musculoskeletal: No edema, cyanosis, or clubbing. Neuro: Alert, answering all questions appropriately. Cranial nerves grossly intact. Skin: No rashes or petechiae noted. Psych: Normal affect.   Appointment on 01/08/2021  Component Date Value Ref Range Status  . Ferritin 01/08/2021 7* 11 - 307 ng/mL Final   Performed at [facility], [address] [place] [place], [place] [address]  . WBC 01/08/2021 6.1  4.0 - 10.5 K/uL Final  . RBC 01/08/2021 4.27  3.87 - 5.11 MIL/uL Final  . Hemoglobin 01/08/2021 14.4  12.0 - 15.0 g/dL Final  . HCT 78/24/2353 43.0  36.0 - 46.0 % Final  . MCV 01/08/2021 100.7* 80.0 - 100.0 fL Final  . MCH 01/08/2021 33.7  26.0 - 34.0 pg Final  . MCHC 01/08/2021 33.5  30.0 - 36.0 g/dL Final  . RDW 61/44/3154 15.0  11.5 - 15.5 % Final  . Platelets 01/08/2021 247  150 - 400 K/uL Final  . nRBC 01/08/2021 0.0  0.0 - 0.2 % Final  . Neutrophils Relative % 01/08/2021 61  % Final  . Neutro Abs 01/08/2021 3.8  1.7 - 7.7 K/uL Final  . Lymphocytes Relative 01/08/2021 26  % Final  . Lymphs Abs 01/08/2021 1.6  0.7 - 4.0 K/uL Final  . Monocytes Relative 01/08/2021 7  % Final  . Monocytes Absolute 01/08/2021 0.4  0.1 - 1.0 K/uL Final  . Eosinophils Relative 01/08/2021 4  % Final  . Eosinophils Absolute 01/08/2021 0.2  0.0 - 0.5 K/uL Final  . Basophils Relative 01/08/2021 1  % Final  . Basophils Absolute 01/08/2021 0.1  0.0 - 0.1 K/uL Final  . Immature Granulocytes 01/08/2021 1  % Final  . Abs Immature Granulocytes 01/08/2021 0.03  0.00 - 0.07 K/uL Final   Performed at [facility], [address] [place], [place] [id number]    Assessment: Patient is 55 year old female status post gastric bypass surgery 2003 with subsequent development of iron deficiency and B12 anemia who returns to clinic for reevaluation and consideration of iron and B12.  1.  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 Not anemic today. Hemoglobin 14.4.  No venofer today.   2.  B12 deficiency- secondary to malabsorption. Continue monthly b12. Will check b12 and folate at next visit.   3.  Macrocytosis- Etiology unclear. No known liver disease or MDS. B12 deficiency and folate deficiency has been treatment. Previously, bone marrow to rule out MDS if counts decline. Continue to monitor.   4. Erythrocytosis- likely secondary to cigarette smoking and sleep apnea. Patient is cutting back on cigarettes and is not interested in sleep apnea testing. Can discuss with PCP. Work up previously revealed slightly elevated EPO, JAK2, V617F, exons 12-15, CALR, and MPL were negative. Carbon monoxide 11.8% (elevated). Continue to monitor.   5. Health maintenance- continue cancer screening studies such as low dose ct, skin exam, mammograms, pap testing, and LDCT as indicated and coordinated through PCP. LDCT due 01/03/21.   Plan:  RTC for b12 monthly x 6 RTC in 4 months for labs (cbc, cmp, ferritin, iron studies, folate, b12), See NP day later for evaluation and consideration of venofer +/- b12.   I discussed the assessment and treatment plan with the patient. The patient was provided an opportunity to ask questions and all were answered. The patient agreed with the plan and demonstrated an understanding of the instructions.   The patient was advised to call back or seek an in-person evaluation if the symptoms worsen or if the condition fails to improve as anticipated.   Consuello MasseLauren Acire Tang, DNP, AGNP-C [facility] [phone] (clinic)

## 2021-01-12 ENCOUNTER — Other Ambulatory Visit: Payer: Self-pay

## 2021-01-12 DIAGNOSIS — D508 Other iron deficiency anemias: Secondary | ICD-10-CM

## 2021-01-23 ENCOUNTER — Telehealth: Payer: Self-pay | Admitting: Family Medicine

## 2021-01-23 DIAGNOSIS — E7800 Pure hypercholesterolemia, unspecified: Secondary | ICD-10-CM

## 2021-01-23 DIAGNOSIS — E119 Type 2 diabetes mellitus without complications: Secondary | ICD-10-CM | POA: Insufficient documentation

## 2021-01-23 NOTE — Telephone Encounter (Signed)
-----   Message from Aquilla Solian, RT sent at 01/22/2021  2:43 PM EDT ----- Regarding: Lab Orders for Friday 6.3.2022 Please place lab orders for Friday 6.3.2022, appt notes state "labs per Dr Ermalene Searing" Thank you, Jones Bales RT(R)

## 2021-01-27 ENCOUNTER — Other Ambulatory Visit: Payer: Self-pay | Admitting: Family Medicine

## 2021-02-07 ENCOUNTER — Inpatient Hospital Stay: Payer: 59 | Attending: Oncology

## 2021-02-07 ENCOUNTER — Inpatient Hospital Stay: Payer: 59

## 2021-02-07 ENCOUNTER — Ambulatory Visit: Payer: 59

## 2021-02-07 ENCOUNTER — Other Ambulatory Visit: Payer: Self-pay

## 2021-02-07 DIAGNOSIS — K9589 Other complications of other bariatric procedure: Secondary | ICD-10-CM

## 2021-02-07 DIAGNOSIS — E538 Deficiency of other specified B group vitamins: Secondary | ICD-10-CM | POA: Diagnosis present

## 2021-02-07 DIAGNOSIS — D508 Other iron deficiency anemias: Secondary | ICD-10-CM

## 2021-02-07 MED ORDER — CYANOCOBALAMIN 1000 MCG/ML IJ SOLN
1000.0000 ug | Freq: Once | INTRAMUSCULAR | Status: AC
Start: 2021-02-07 — End: 2021-02-07
  Administered 2021-02-07: 1000 ug via INTRAMUSCULAR
  Filled 2021-02-07: qty 1

## 2021-02-09 ENCOUNTER — Other Ambulatory Visit (INDEPENDENT_AMBULATORY_CARE_PROVIDER_SITE_OTHER): Payer: 59

## 2021-02-09 ENCOUNTER — Other Ambulatory Visit: Payer: Self-pay

## 2021-02-09 DIAGNOSIS — E119 Type 2 diabetes mellitus without complications: Secondary | ICD-10-CM

## 2021-02-09 LAB — HEMOGLOBIN A1C: Hgb A1c MFr Bld: 7.1 % — ABNORMAL HIGH (ref 4.6–6.5)

## 2021-02-09 NOTE — Addendum Note (Signed)
Addended by: Aquilla Solian on: 02/09/2021 09:30 AM   Modules accepted: Orders

## 2021-02-12 LAB — LIPID PANEL
Cholesterol: 183 mg/dL (ref 0–200)
HDL: 37.1 mg/dL — ABNORMAL LOW (ref >39.00)
NonHDL: 146.01
Total CHOL/HDL Ratio: 5
Triglycerides: 227 mg/dL — ABNORMAL HIGH (ref 0.0–149.0)
VLDL: 45.4 mg/dL — ABNORMAL HIGH (ref 0.0–40.0)

## 2021-02-12 LAB — COMPREHENSIVE METABOLIC PANEL
ALT: 9 U/L (ref 0–35)
AST: 23 U/L (ref 0–37)
Albumin: 4 g/dL (ref 3.5–5.2)
Alkaline Phosphatase: 57 U/L (ref 39–117)
BUN: 24 mg/dL — ABNORMAL HIGH (ref 6–23)
CO2: 20 mEq/L (ref 19–32)
Calcium: 9.7 mg/dL (ref 8.4–10.5)
Chloride: 108 mEq/L (ref 96–112)
Creatinine, Ser: 0.65 mg/dL (ref 0.40–1.20)
GFR: 98.53 mL/min (ref >60.00)
Glucose, Bld: 68 mg/dL — ABNORMAL LOW (ref 70–99)
Potassium: 4.3 mEq/L (ref 3.5–5.1)
Sodium: 141 mEq/L (ref 135–145)
Total Bilirubin: 0.1 mg/dL — ABNORMAL LOW (ref 0.2–1.2)
Total Protein: 7.2 g/dL (ref 6.0–8.3)

## 2021-02-12 LAB — MICROALBUMIN / CREATININE URINE RATIO
Creatinine,U: 129.3 mg/dL
Microalb Creat Ratio: 3.8 mg/g (ref 0.0–30.0)
Microalb, Ur: 4.9 mg/dL — ABNORMAL HIGH (ref 0.0–1.9)

## 2021-02-12 LAB — LDL CHOLESTEROL, DIRECT: Direct LDL: 102 mg/dL

## 2021-02-12 NOTE — Progress Notes (Signed)
No critical labs need to be addressed urgently. We will discuss labs in detail at upcoming office visit.   

## 2021-02-16 ENCOUNTER — Ambulatory Visit: Payer: 59 | Admitting: Family Medicine

## 2021-02-23 ENCOUNTER — Ambulatory Visit: Payer: 59 | Admitting: Family Medicine

## 2021-02-23 ENCOUNTER — Encounter: Payer: Self-pay | Admitting: Family Medicine

## 2021-02-23 ENCOUNTER — Other Ambulatory Visit: Payer: Self-pay

## 2021-02-23 VITALS — BP 130/80 | HR 74 | Temp 97.8°F | Ht 60.0 in | Wt 148.5 lb

## 2021-02-23 DIAGNOSIS — M5442 Lumbago with sciatica, left side: Secondary | ICD-10-CM

## 2021-02-23 DIAGNOSIS — E785 Hyperlipidemia, unspecified: Secondary | ICD-10-CM

## 2021-02-23 DIAGNOSIS — M21372 Foot drop, left foot: Secondary | ICD-10-CM

## 2021-02-23 DIAGNOSIS — E1159 Type 2 diabetes mellitus with other circulatory complications: Secondary | ICD-10-CM | POA: Diagnosis not present

## 2021-02-23 DIAGNOSIS — I152 Hypertension secondary to endocrine disorders: Secondary | ICD-10-CM

## 2021-02-23 DIAGNOSIS — E1169 Type 2 diabetes mellitus with other specified complication: Secondary | ICD-10-CM

## 2021-02-23 DIAGNOSIS — E119 Type 2 diabetes mellitus without complications: Secondary | ICD-10-CM | POA: Diagnosis not present

## 2021-02-23 DIAGNOSIS — R200 Anesthesia of skin: Secondary | ICD-10-CM

## 2021-02-23 MED ORDER — ESOMEPRAZOLE MAGNESIUM 40 MG PO CPDR: 40.0000 mg | DELAYED_RELEASE_CAPSULE | Freq: Two times a day (BID) | ORAL | 11 refills | Status: DC

## 2021-02-23 MED ORDER — ATORVASTATIN CALCIUM 10 MG PO TABS: 10.0000 mg | ORAL_TABLET | Freq: Every day | ORAL | 11 refills | Status: DC

## 2021-02-23 NOTE — Progress Notes (Addendum)
Patient ID: Brenda Hawkins, female    DOB: 01-08-1966, 55 y.o.   MRN: [id number]  This visit was conducted in person.  BP 130/80   Pulse 74   Temp 97.8 F (36.6 C) (Temporal)   Ht 5' (1.524 m)   Wt 148 lb 8 oz (67.4 kg)   SpO2 97%   BMI 29.00 kg/m    CC:  Chief Complaint  Patient presents with   Diabetes    Subjective:   HPI: Brenda Blockeresa Carol Marion is a 55 y.o. female presenting on 02/23/2021 for Diabetes    Diabetes: Worsened control but did have prednisone taper and decreased mobility in last 3 months. Lab Results  Component Value Date   HGBA1C 7.1 (H) 02/09/2021  Using medications without difficulties: Hypoglycemic episodes: Hyperglycemic episodes: Feet problems: no ulcers Blood Sugars averaging:not chcecking eye exam within last year:yes  She has lost 6 lbs in last 6 months Wt Readings from Last 3 Encounters:  02/23/21 148 lb 8 oz (67.4 kg)  01/09/21 151 lb 3.8 oz (68.6 kg)  09/12/20 154 lb 4 oz (70 kg)  Body mass index is 29 kg/m.    Hypertension:    At goal on no medication. BP Readings from Last 3 Encounters:  02/23/21 130/80  01/09/21 (!) 158/61  10/17/20 140/84  Using medication without problems or lightheadedness: none Chest pain with exertion: none Edema: none Short of breath: none Average home BPs: Other issues:  Elevated Cholesterol:  LDL almost at goal < 70 on atorvastatin 10 mg  daily  Lab Results  Component Value Date   CHOL 183 02/09/2021   HDL 37.10 (L) 02/09/2021   LDLCALC 111 (H) 12/09/2014   LDLDIRECT 102.0 02/09/2021   TRIG 227.0 (H) 02/09/2021   CHOLHDL 5 02/09/2021   Using medications without problems: Muscle aches:  Diet compliance: Exercise: Other complaints:   Seen in 10/2020 with  low back pain, left foot drop and left leg pain. Treated with 2 courses of oral steroids.   She had reported worsening pain and decreasing strength in left foot in last 6-9 months.  Following  steroids in 10/2020 she had improvement in  pain but .. worsening of numbness and continue left foot drop.   The patient has failed 6 weeks of conservative treatment including home PT, hot showers,  ibuprofen, tylenol... all failed to improve issue.  Relevant past medical, surgical, family and social history reviewed and updated as indicated. Interim medical history since our last visit reviewed. Allergies and medications reviewed and updated. Outpatient Medications Prior to Visit  Medication Sig Dispense Refill   albuterol (VENTOLIN HFA) 108 (90 Base) MCG/ACT inhaler Inhale 2 puffs into the lungs every 6 (six) hours as needed for wheezing or shortness of breath. 6.7 g 0   atorvastatin (LIPITOR) 10 MG tablet Take 1 tablet (10 mg total) by mouth daily. 30 tablet 0   esomeprazole (NEXIUM) 40 MG capsule Take 1 capsule (40 mg total) by mouth 2 (two) times daily before a meal. 60 capsule 6   predniSONE (DELTASONE) 20 MG tablet 3 tabs by mouth daily x 3 days, then 2 tabs by mouth daily x 2 days then 1 tab by mouth daily x 2 days 15 tablet 0   Facility-Administered Medications Prior to Visit  Medication Dose Route Frequency Provider Last Rate Last Admin   sodium chloride 0.9 % injection 10 mL  10 mL Intracatheter PRN Jeralyn RuthsFinnegan, Timothy J, MD         Per  HPI unless specifically indicated in ROS section below Review of Systems Objective:  BP 130/80   Pulse 74   Temp 97.8 F (36.6 C) (Temporal)   Ht 5' (1.524 m)   Wt 148 lb 8 oz (67.4 kg)   SpO2 97%   BMI 29.00 kg/m   Wt Readings from Last 3 Encounters:  02/23/21 148 lb 8 oz (67.4 kg)  01/09/21 151 lb 3.8 oz (68.6 kg)  09/12/20 154 lb 4 oz (70 kg)      Physical Exam Constitutional:      General: She is not in acute distress.    Appearance: Normal appearance. She is well-developed. She is not ill-appearing or toxic-appearing.  HENT:     Head: Normocephalic.     Right Ear: Hearing, tympanic membrane, ear canal and external ear normal. Tympanic membrane is not erythematous,  retracted or bulging.     Left Ear: Hearing, tympanic membrane, ear canal and external ear normal. Tympanic membrane is not erythematous, retracted or bulging.     Nose: No mucosal edema or rhinorrhea.     Right Sinus: No maxillary sinus tenderness or frontal sinus tenderness.     Left Sinus: No maxillary sinus tenderness or frontal sinus tenderness.     Mouth/Throat:     Pharynx: Uvula midline.  Eyes:     General: Lids are normal. Lids are everted, no foreign bodies appreciated.     Conjunctiva/sclera: Conjunctivae normal.     Pupils: Pupils are equal, round, and reactive to light.  Neck:     Thyroid: No thyroid mass or thyromegaly.     Vascular: No carotid bruit.     Trachea: Trachea normal.  Cardiovascular:     Rate and Rhythm: Normal rate and regular rhythm.     Pulses: Normal pulses.     Heart sounds: Normal heart sounds, S1 normal and S2 normal. No murmur heard.   No friction rub. No gallop.  Pulmonary:     Effort: Pulmonary effort is normal. No tachypnea or respiratory distress.     Breath sounds: Normal breath sounds. No decreased breath sounds, wheezing, rhonchi or rales.  Abdominal:     General: Bowel sounds are normal.     Palpations: Abdomen is soft.     Tenderness: There is no abdominal tenderness.  Musculoskeletal:     Cervical back: Normal, normal range of motion and neck supple.     Thoracic back: Normal.     Lumbar back: Tenderness present. No bony tenderness. Normal range of motion. Negative right straight leg raise test and negative left straight leg raise test.     Comments: ttp in left buttock at sciatic nothc  Skin:    General: Skin is warm and dry.     Findings: No rash.  Neurological:     Mental Status: She is alert and oriented to person, place, and time.     Sensory: Sensory deficit present.     Motor: Weakness present.     Comments: Left calf atrophy Numbness in toes in left foot 0/5 toe flexion and extension  5/5 ankle  Dorsi flexion and extension  strength  5/5 otherwise in left extremity  5/5 in right ext throughout   Psychiatric:        Mood and Affect: Mood is not anxious or depressed.        Speech: Speech normal.        Behavior: Behavior normal. Behavior is cooperative.        Thought Content:  Thought content normal.        Judgment: Judgment normal.      Results for orders placed or performed in visit on 02/09/21  Comprehensive metabolic panel  Result Value Ref Range   Sodium 141 135 - 145 mEq/L   Potassium 4.3 3.5 - 5.1 mEq/L   Chloride 108 96 - 112 mEq/L   CO2 20 19 - 32 mEq/L   Glucose, Bld 68 (L) 70 - 99 mg/dL   BUN 24 (H) 6 - 23 mg/dL   Creatinine, Ser 8.12 0.40 - 1.20 mg/dL   Total Bilirubin 0.1 (L) 0.2 - 1.2 mg/dL   Alkaline Phosphatase 57 39 - 117 U/L   AST 23 0 - 37 U/L   ALT 9 0 - 35 U/L   Total Protein 7.2 6.0 - 8.3 g/dL   Albumin 4.0 3.5 - 5.2 g/dL   GFR 75.17 >00.17 mL/min   Calcium 9.7 8.4 - 10.5 mg/dL  Lipid panel  Result Value Ref Range   Cholesterol 183 0 - 200 mg/dL   Triglycerides 494.4 (H) 0.0 - 149.0 mg/dL   HDL 96.75 (L) >91.63 mg/dL   VLDL 84.6 (H) 0.0 - 65.9 mg/dL   Total CHOL/HDL Ratio 5    NonHDL 146.01   Hemoglobin A1c  Result Value Ref Range   Hgb A1c MFr Bld 7.1 (H) 4.6 - 6.5 %  Microalbumin / creatinine urine ratio  Result Value Ref Range   Microalb, Ur 4.9 (H) 0.0 - 1.9 mg/dL   Creatinine,U 935.7 mg/dL   Microalb Creat Ratio 3.8 0.0 - 30.0 mg/g  LDL cholesterol, direct  Result Value Ref Range   Direct LDL 102.0 mg/dL    This visit occurred during the SARS-CoV-2 public health emergency.  Safety protocols were in place, including screening questions prior to the visit, additional usage of staff PPE, and extensive cleaning of exam room while observing appropriate contact time as indicated for disinfecting solutions.   COVID 19 screen:  No recent travel or known exposure to COVID19 The patient denies respiratory symptoms of COVID 19 at this time. The importance of social  distancing was discussed today.   Assessment and Plan Problem List Items Addressed This Visit     Acute left-sided low back pain with left-sided sciatica    Seen in 10/2020 with  low back pain, left foot drop and left leg pain. Treated with 2 courses of oral steroids.   She had reported worsening pain and decreasing strength in left foot in last 6-9 months.  Following  steroids in 10/2020 she had improvement in pain but .. worsening of numbness and continue left foot drop.   The patient has failed 6 weeks of conservative treatment including home PT, hot showers,  ibuprofen, tylenol, 2 courses of prednisone... all failed to improve issue.  Worrisome for herniated disc on left causing compression of sciatic nerve resulting in left leg pain, left foot drop and left lower leg numbness.  Will evaluate with MRI lumbar spine. Discussed referral to back specialist and consideration of spinal injections/ surgery once further iformation obtained from MRI.       Relevant Orders   MR Lumbar Spine Wo Contrast   Controlled type 2 diabetes mellitus without complication, without long-term current use of insulin (HCC) - Primary    Worsened control but did have prednisone taper and decreased mobility in last 3 months. Encouraged exercise, weight loss, healthy eating habits.        Relevant Medications   atorvastatin (LIPITOR)  10 MG tablet   Hyperlipidemia associated with type 2 diabetes mellitus (HCC)    LDL almost at goal < 70 on atorvastatin 10 mg  daily        Relevant Medications   atorvastatin (LIPITOR) 10 MG tablet   Hypertension associated with diabetes (HCC)    At goal on no medication, now that pain is controlled.       Relevant Medications   atorvastatin (LIPITOR) 10 MG tablet   Left foot drop   Relevant Orders   MR Lumbar Spine Wo Contrast   Other Visit Diagnoses     Left leg numbness       Relevant Orders   MR Lumbar Spine Wo Contrast          Kerby Nora, MD

## 2021-02-23 NOTE — Patient Instructions (Addendum)
Work on decreasing ice cream, pasta , rice etc. Also low fat low cholesterol diet.  Increase walking as able.  I will move forward with setting up MRI.

## 2021-02-23 NOTE — Assessment & Plan Note (Signed)
LDL almost at goal < 70 on atorvastatin 10 mg  daily

## 2021-02-23 NOTE — Assessment & Plan Note (Signed)
At goal on no medication, now that pain is controlled.

## 2021-02-23 NOTE — Assessment & Plan Note (Signed)
Worsened control but did have prednisone taper and decreased mobility in last 3 months. Encouraged exercise, weight loss, healthy eating habits.

## 2021-02-23 NOTE — Assessment & Plan Note (Signed)
Seen in 10/2020 with  low back pain, left foot drop and left leg pain. Treated with 2 courses of oral steroids.   She had reported worsening pain and decreasing strength in left foot in last 6-9 months.  Following  steroids in 10/2020 she had improvement in pain but .. worsening of numbness and continue left foot drop.   The patient has failed 6 weeks of conservative treatment including home PT, hot showers,  ibuprofen, tylenol, 2 courses of prednisone... all failed to improve issue.  Worrisome for herniated disc on left causing compression of sciatic nerve resulting in left leg pain, left foot drop and left lower leg numbness.  Will evaluate with MRI lumbar spine. Discussed referral to back specialist and consideration of spinal injections/ surgery once further iformation obtained from MRI.

## 2021-03-09 ENCOUNTER — Encounter: Payer: Self-pay | Admitting: Family Medicine

## 2021-03-14 ENCOUNTER — Other Ambulatory Visit: Payer: Self-pay

## 2021-03-14 ENCOUNTER — Inpatient Hospital Stay: Payer: 59

## 2021-03-14 ENCOUNTER — Inpatient Hospital Stay: Payer: 59 | Attending: Oncology

## 2021-03-14 DIAGNOSIS — E538 Deficiency of other specified B group vitamins: Secondary | ICD-10-CM | POA: Diagnosis not present

## 2021-03-14 DIAGNOSIS — D508 Other iron deficiency anemias: Secondary | ICD-10-CM

## 2021-03-14 MED ORDER — CYANOCOBALAMIN 1000 MCG/ML IJ SOLN
1000.0000 ug | Freq: Once | INTRAMUSCULAR | Status: AC
  Administered 2021-03-14: 1000 ug via INTRAMUSCULAR

## 2021-04-11 ENCOUNTER — Inpatient Hospital Stay: Payer: 59

## 2021-04-17 ENCOUNTER — Other Ambulatory Visit: Payer: Self-pay | Admitting: Nurse Practitioner

## 2021-04-17 ENCOUNTER — Other Ambulatory Visit: Payer: Self-pay

## 2021-04-17 ENCOUNTER — Inpatient Hospital Stay: Payer: 59

## 2021-04-17 ENCOUNTER — Inpatient Hospital Stay: Payer: 59 | Attending: Internal Medicine

## 2021-04-17 DIAGNOSIS — D508 Other iron deficiency anemias: Secondary | ICD-10-CM

## 2021-04-17 DIAGNOSIS — K909 Intestinal malabsorption, unspecified: Secondary | ICD-10-CM | POA: Diagnosis present

## 2021-04-17 DIAGNOSIS — K9589 Other complications of other bariatric procedure: Secondary | ICD-10-CM

## 2021-04-17 DIAGNOSIS — E538 Deficiency of other specified B group vitamins: Secondary | ICD-10-CM | POA: Insufficient documentation

## 2021-04-17 LAB — CBC WITH DIFFERENTIAL/PLATELET
Abs Immature Granulocytes: 0.04 10*3/uL (ref 0.00–0.07)
Basophils Absolute: 0 10*3/uL (ref 0.0–0.1)
Basophils Relative: 1 %
Eosinophils Absolute: 0.2 10*3/uL (ref 0.0–0.5)
Eosinophils Relative: 3 %
HCT: 40.8 % (ref 36.0–46.0)
Hemoglobin: 13.8 g/dL (ref 12.0–15.0)
Immature Granulocytes: 1 %
Lymphocytes Relative: 22 %
Lymphs Abs: 1.4 10*3/uL (ref 0.7–4.0)
MCH: 34.2 pg — ABNORMAL HIGH (ref 26.0–34.0)
MCHC: 33.8 g/dL (ref 30.0–36.0)
MCV: 101.2 fL — ABNORMAL HIGH (ref 80.0–100.0)
Monocytes Absolute: 0.5 10*3/uL (ref 0.1–1.0)
Monocytes Relative: 9 %
Neutro Abs: 4 10*3/uL (ref 1.7–7.7)
Neutrophils Relative %: 64 %
Platelets: 286 10*3/uL (ref 150–400)
RBC: 4.03 MIL/uL (ref 3.87–5.11)
RDW: 15.2 % (ref 11.5–15.5)
WBC: 6.2 10*3/uL (ref 4.0–10.5)
nRBC: 0 % (ref 0.0–0.2)

## 2021-04-17 LAB — FOLATE: Folate: 7.7 ng/mL (ref >5.9)

## 2021-04-17 LAB — VITAMIN B12: Vitamin B-12: 519 pg/mL (ref 180–914)

## 2021-04-17 LAB — FERRITIN: Ferritin: 5 ng/mL — ABNORMAL LOW (ref 11–307)

## 2021-04-17 MED ORDER — CYANOCOBALAMIN 1000 MCG/ML IJ SOLN
1000.0000 ug | Freq: Once | INTRAMUSCULAR | Status: AC
  Administered 2021-04-17: 1000 ug via INTRAMUSCULAR

## 2021-04-17 NOTE — Progress Notes (Signed)
Spoke to patient. Ferritin low at 5. Recommend IV iron. She has previously received venofer, tolerated well. Risks, benefits, and alternatives reviewed. Patient wishes to proceed with IV iron. Will send schedule message to get approved and scheduled. Patient scheduled to establish care with Dr. Donneta Romberg in November.

## 2021-04-20 ENCOUNTER — Inpatient Hospital Stay: Payer: 59

## 2021-04-20 VITALS — BP 161/94 | HR 86 | Temp 98.8°F | Resp 18

## 2021-04-20 DIAGNOSIS — D508 Other iron deficiency anemias: Secondary | ICD-10-CM | POA: Diagnosis not present

## 2021-04-20 DIAGNOSIS — K9589 Other complications of other bariatric procedure: Secondary | ICD-10-CM

## 2021-04-20 MED ORDER — IRON SUCROSE 20 MG/ML IV SOLN
200.0000 mg | Freq: Once | INTRAVENOUS | Status: AC
  Administered 2021-04-20: 200 mg via INTRAVENOUS

## 2021-04-20 MED ORDER — SODIUM CHLORIDE 0.9 % IV SOLN
Freq: Once | INTRAVENOUS | Status: AC
  Filled 2021-04-20: qty 250

## 2021-04-20 MED ORDER — SODIUM CHLORIDE 0.9 % IV SOLN: 200.0000 mg | Freq: Once | INTRAVENOUS | Status: DC

## 2021-04-24 ENCOUNTER — Inpatient Hospital Stay: Payer: 59

## 2021-04-24 ENCOUNTER — Other Ambulatory Visit: Payer: Self-pay

## 2021-04-24 VITALS — BP 170/84 | HR 72 | Temp 97.0°F | Resp 18

## 2021-04-24 DIAGNOSIS — K9589 Other complications of other bariatric procedure: Secondary | ICD-10-CM

## 2021-04-24 DIAGNOSIS — D508 Other iron deficiency anemias: Secondary | ICD-10-CM

## 2021-04-24 IMAGING — US ULTRASOUND ABDOMEN LIMITED
1 series · 14 of 25 positions shown · non-contrast
Comparison: CT scan of January 04, 2019.

CLINICAL DATA: Cholelithiasis, abdominal pain for 3 months.

EXAM:
ULTRASOUND ABDOMEN LIMITED RIGHT UPPER QUADRANT

[Series 1: ultrasound abdomen limited · 0.22mm/px · 14 of 54 slices shown]
[im 1/54]
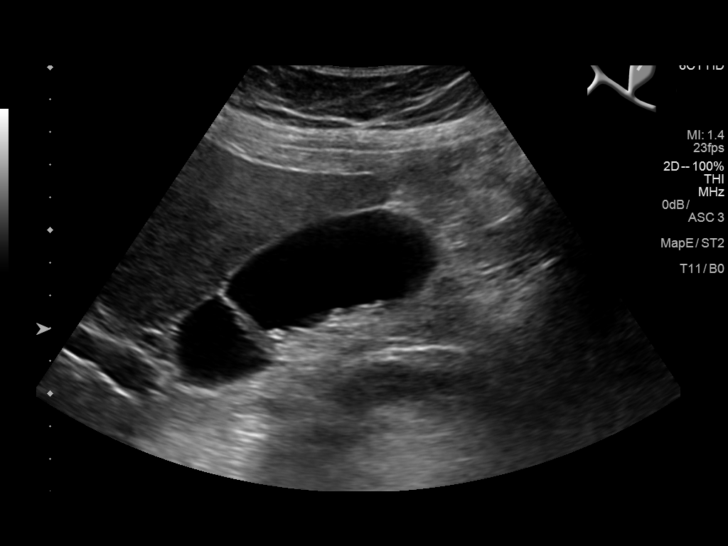
[im 5/54]
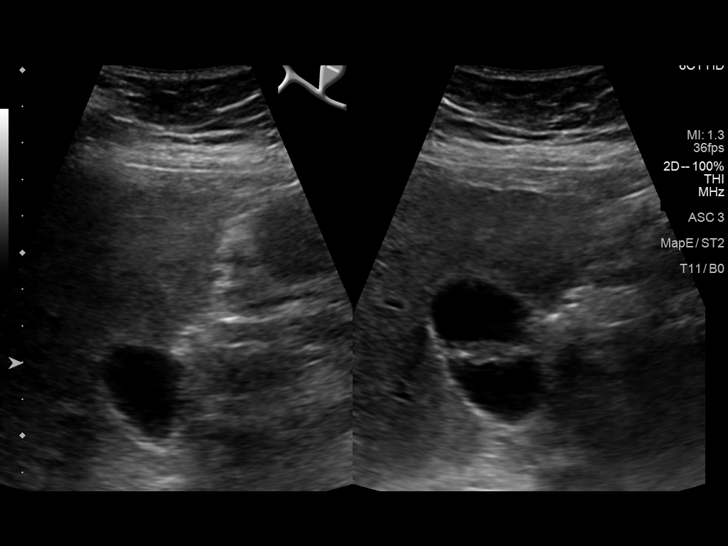
[im 9/54]
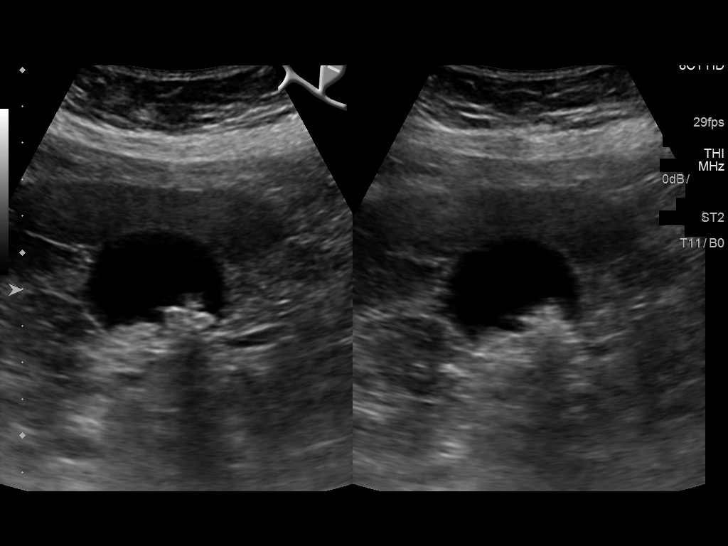
[im 14/54]
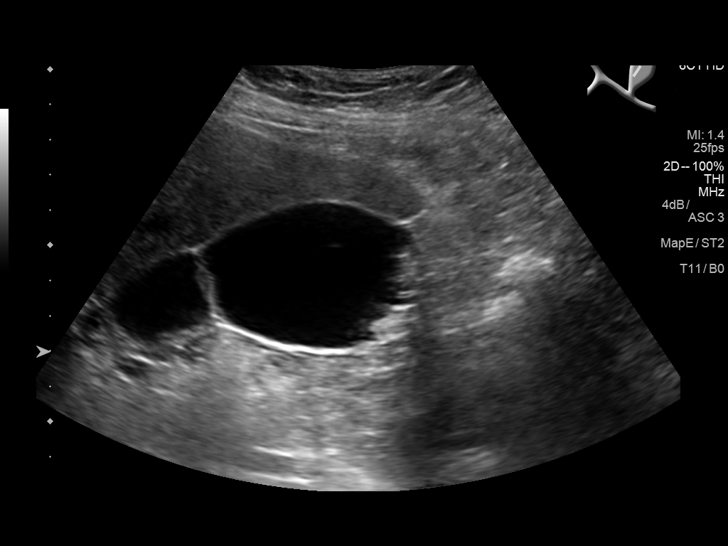
[im 18/54]
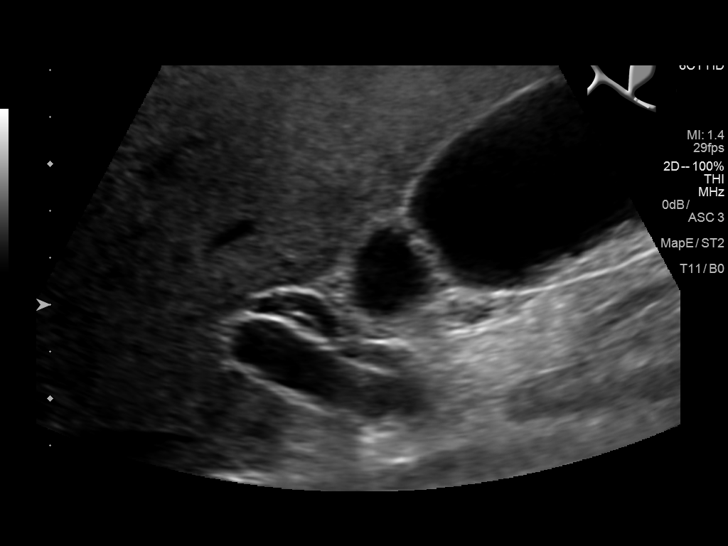
[im 20/54]
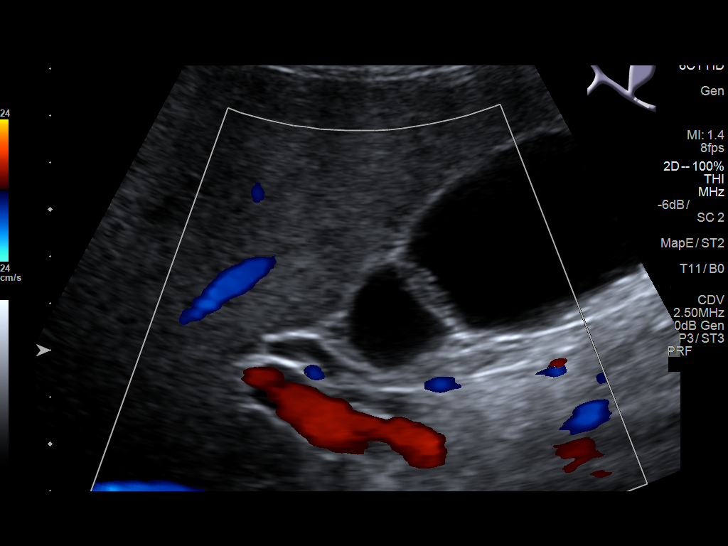
[im 25/54]
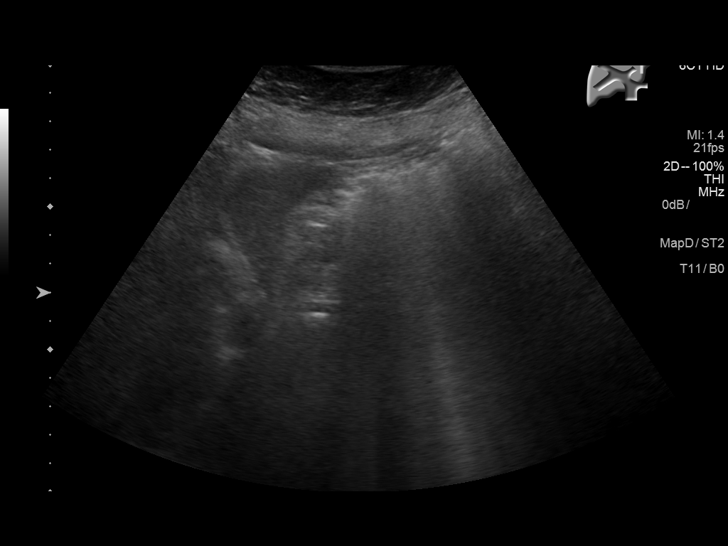
[im 29/54]
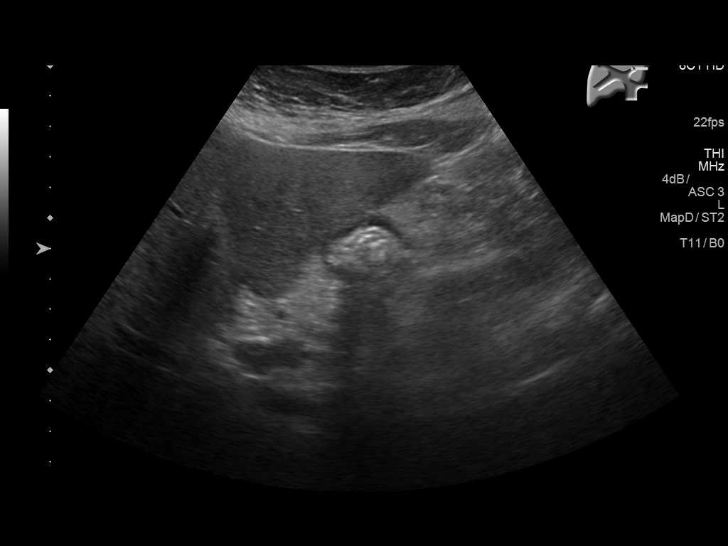
[im 34/54]
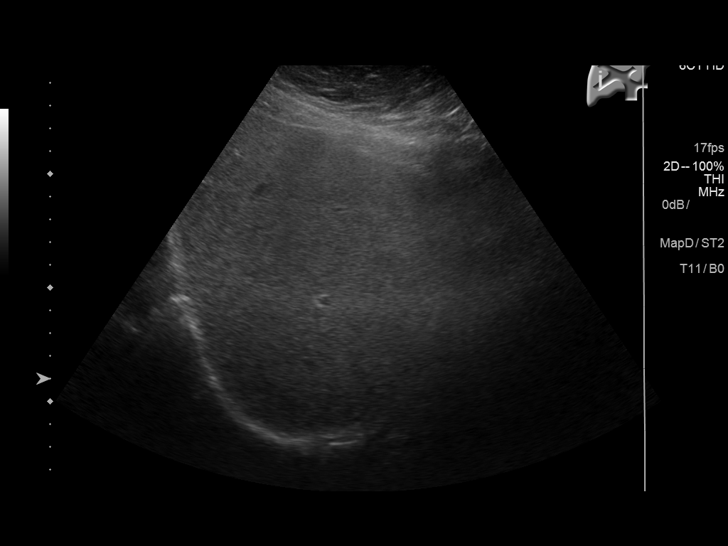
[im 36/54]
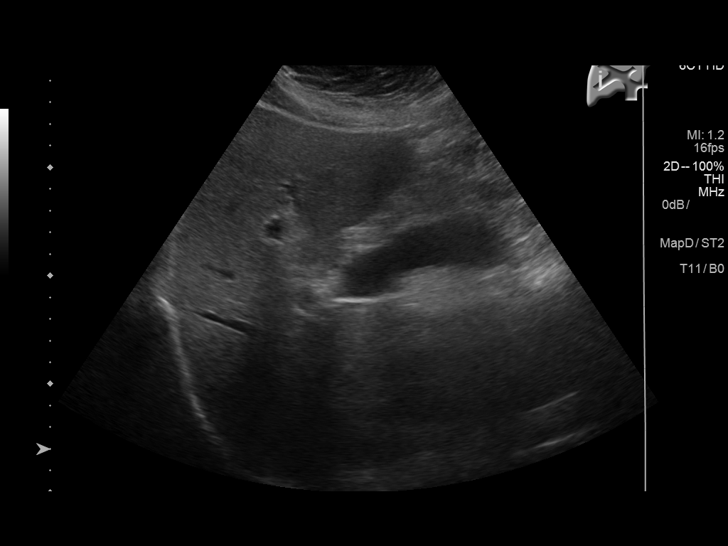
[im 40/54]
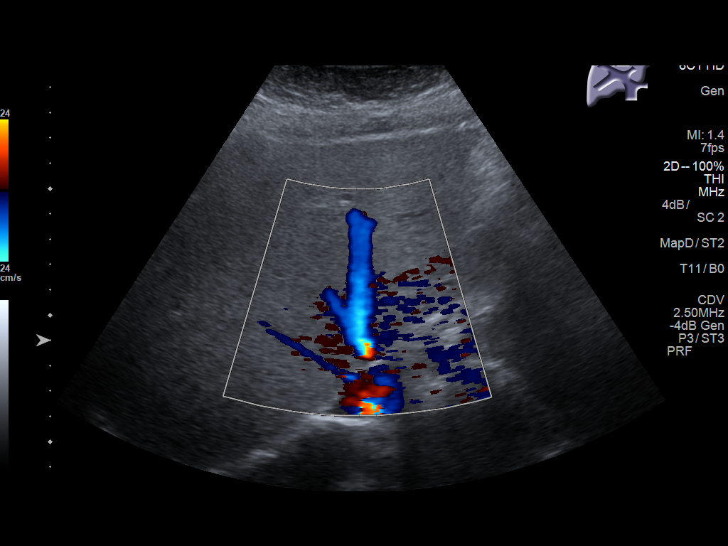
[im 45/54]
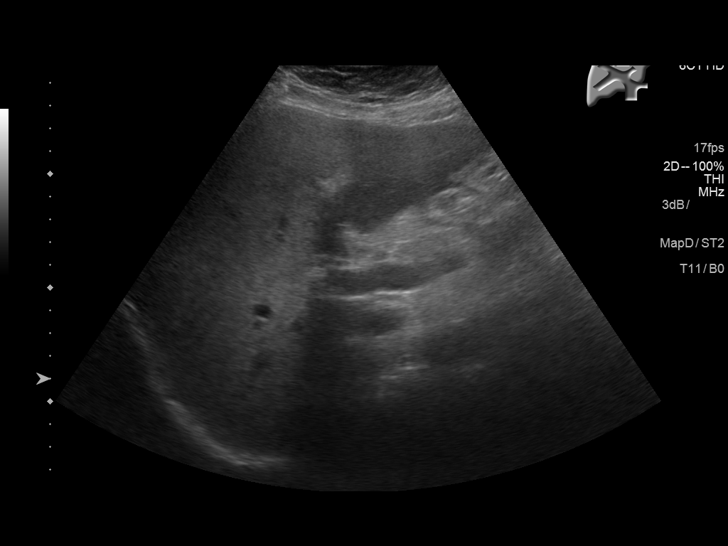
[im 49/54]
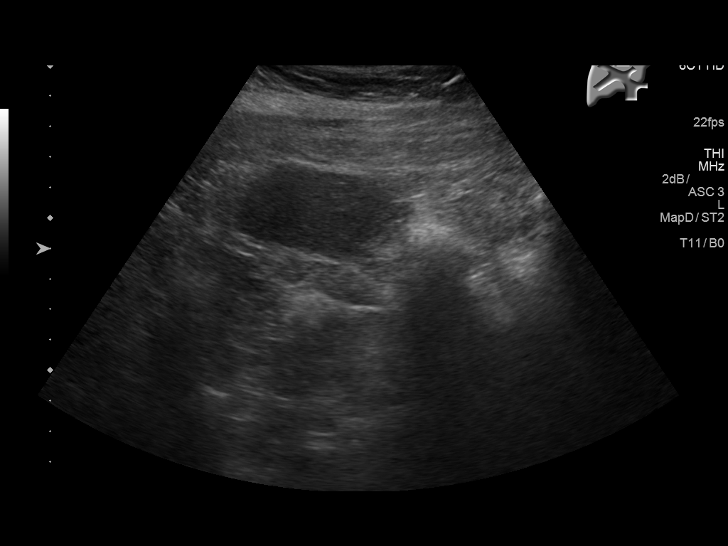
[im 54/54]
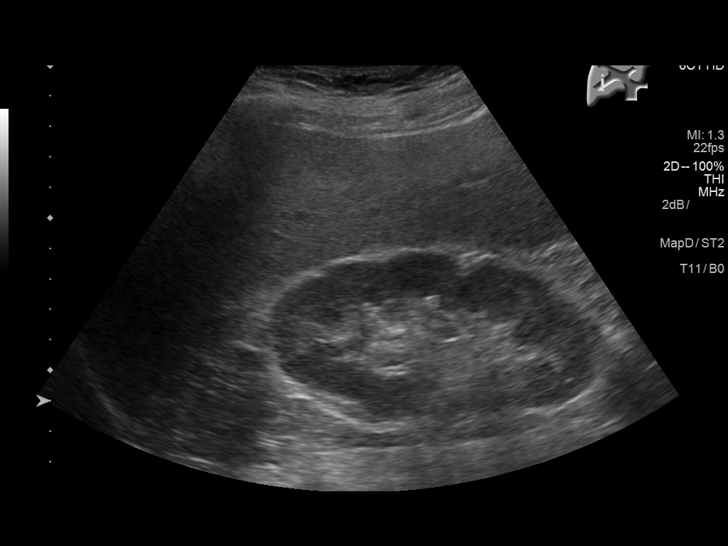

[14 of 25 positions shown; findings below may reference images not displayed]

FINDINGS: Gallbladder:

Cholelithiasis is noted without significant gallbladder wall
thickening or pericholecystic fluid. No sonographic Murphy's sign is
noted. Largest calculus measures 8 mm.

Common bile duct:

Diameter: 5 mm which is within normal limits.

Liver:

No focal lesion identified. Within normal limits in parenchymal
echogenicity. Portal vein is patent on color Doppler imaging with
normal direction of blood flow towards the liver.
IMPRESSION: Cholelithiasis without inflammation. No other abnormality seen in
the right upper quadrant of the abdomen.

## 2021-04-24 MED ORDER — SODIUM CHLORIDE 0.9 % IV SOLN: 200.0000 mg | Freq: Once | INTRAVENOUS | Status: DC

## 2021-04-24 MED ORDER — SODIUM CHLORIDE 0.9 % IV SOLN
Freq: Once | INTRAVENOUS | Status: AC
Start: 2021-04-24 — End: 2021-04-24
  Filled 2021-04-24: qty 250

## 2021-04-24 MED ORDER — IRON SUCROSE 20 MG/ML IV SOLN
200.0000 mg | Freq: Once | INTRAVENOUS | Status: AC
  Administered 2021-04-24: 200 mg via INTRAVENOUS
  Filled 2021-04-24: qty 10

## 2021-04-25 ENCOUNTER — Inpatient Hospital Stay: Payer: 59

## 2021-04-26 ENCOUNTER — Inpatient Hospital Stay: Payer: 59

## 2021-04-26 ENCOUNTER — Other Ambulatory Visit: Payer: Self-pay

## 2021-04-26 VITALS — BP 160/85 | HR 72 | Temp 97.4°F | Resp 18

## 2021-04-26 DIAGNOSIS — D508 Other iron deficiency anemias: Secondary | ICD-10-CM | POA: Diagnosis not present

## 2021-04-26 DIAGNOSIS — K9589 Other complications of other bariatric procedure: Secondary | ICD-10-CM

## 2021-04-26 MED ORDER — SODIUM CHLORIDE 0.9 % IV SOLN
Freq: Once | INTRAVENOUS | Status: AC
  Filled 2021-04-26: qty 250

## 2021-04-26 MED ORDER — IRON SUCROSE 20 MG/ML IV SOLN
200.0000 mg | Freq: Once | INTRAVENOUS | Status: AC
  Administered 2021-04-26: 200 mg via INTRAVENOUS
  Filled 2021-04-26: qty 10

## 2021-04-26 MED ORDER — SODIUM CHLORIDE 0.9 % IV SOLN: 200.0000 mg | Freq: Once | INTRAVENOUS | Status: DC

## 2021-04-26 NOTE — Patient Instructions (Signed)

## 2021-04-27 ENCOUNTER — Inpatient Hospital Stay: Payer: 59

## 2021-05-01 ENCOUNTER — Inpatient Hospital Stay: Payer: 59

## 2021-05-01 ENCOUNTER — Other Ambulatory Visit: Payer: Self-pay

## 2021-05-01 VITALS — BP 157/92 | HR 86 | Temp 97.0°F | Resp 18

## 2021-05-01 DIAGNOSIS — K9589 Other complications of other bariatric procedure: Secondary | ICD-10-CM

## 2021-05-01 DIAGNOSIS — D508 Other iron deficiency anemias: Secondary | ICD-10-CM | POA: Diagnosis not present

## 2021-05-01 MED ORDER — SODIUM CHLORIDE 0.9 % IV SOLN: 200.0000 mg | Freq: Once | INTRAVENOUS | Status: DC

## 2021-05-01 MED ORDER — SODIUM CHLORIDE 0.9 % IV SOLN
Freq: Once | INTRAVENOUS | Status: AC
  Filled 2021-05-01: qty 250

## 2021-05-01 MED ORDER — IRON SUCROSE 20 MG/ML IV SOLN
200.0000 mg | Freq: Once | INTRAVENOUS | Status: AC
  Administered 2021-05-01: 200 mg via INTRAVENOUS
  Filled 2021-05-01: qty 10

## 2021-05-02 ENCOUNTER — Inpatient Hospital Stay: Payer: 59

## 2021-05-03 ENCOUNTER — Other Ambulatory Visit: Payer: Self-pay

## 2021-05-03 ENCOUNTER — Inpatient Hospital Stay: Payer: 59

## 2021-05-03 VITALS — BP 154/90 | HR 80 | Resp 20

## 2021-05-03 DIAGNOSIS — D508 Other iron deficiency anemias: Secondary | ICD-10-CM

## 2021-05-03 MED ORDER — SODIUM CHLORIDE 0.9 % IV SOLN: 200.0000 mg | Freq: Once | INTRAVENOUS | Status: DC

## 2021-05-03 MED ORDER — IRON SUCROSE 20 MG/ML IV SOLN
200.0000 mg | Freq: Once | INTRAVENOUS | Status: AC
  Administered 2021-05-03: 200 mg via INTRAVENOUS

## 2021-05-03 MED ORDER — SODIUM CHLORIDE 0.9 % IV SOLN
Freq: Once | INTRAVENOUS | Status: AC
  Filled 2021-05-03: qty 250

## 2021-05-15 ENCOUNTER — Ambulatory Visit: Payer: 59 | Admitting: Nurse Practitioner

## 2021-05-15 ENCOUNTER — Other Ambulatory Visit: Payer: 59

## 2021-05-16 ENCOUNTER — Ambulatory Visit: Payer: 59 | Admitting: Oncology

## 2021-05-18 ENCOUNTER — Telehealth: Payer: Self-pay | Admitting: Family Medicine

## 2021-05-18 ENCOUNTER — Other Ambulatory Visit: Payer: Self-pay

## 2021-05-18 ENCOUNTER — Inpatient Hospital Stay: Payer: 59 | Attending: Internal Medicine

## 2021-05-18 DIAGNOSIS — K9589 Other complications of other bariatric procedure: Secondary | ICD-10-CM

## 2021-05-18 DIAGNOSIS — E538 Deficiency of other specified B group vitamins: Secondary | ICD-10-CM | POA: Insufficient documentation

## 2021-05-18 DIAGNOSIS — E119 Type 2 diabetes mellitus without complications: Secondary | ICD-10-CM

## 2021-05-18 DIAGNOSIS — D508 Other iron deficiency anemias: Secondary | ICD-10-CM

## 2021-05-18 MED ORDER — CYANOCOBALAMIN 1000 MCG/ML IJ SOLN
1000.0000 ug | Freq: Once | INTRAMUSCULAR | Status: AC
  Administered 2021-05-18: 1000 ug via INTRAMUSCULAR

## 2021-05-18 NOTE — Telephone Encounter (Signed)
-----   Message from Alvina Chou sent at 05/07/2021 10:23 AM EDT ----- Regarding: Lab orders for Thursday, 9.15.22 Lab orders for a 3 month follow up appt.

## 2021-05-24 ENCOUNTER — Other Ambulatory Visit: Payer: 59

## 2021-05-29 ENCOUNTER — Ambulatory Visit: Payer: 59 | Admitting: Family Medicine

## 2021-06-18 ENCOUNTER — Ambulatory Visit: Payer: 59

## 2021-06-19 ENCOUNTER — Other Ambulatory Visit: Payer: Self-pay

## 2021-06-19 ENCOUNTER — Inpatient Hospital Stay: Payer: 59 | Attending: Oncology

## 2021-06-19 DIAGNOSIS — D538 Other specified nutritional anemias: Secondary | ICD-10-CM | POA: Diagnosis not present

## 2021-06-19 DIAGNOSIS — E538 Deficiency of other specified B group vitamins: Secondary | ICD-10-CM | POA: Diagnosis present

## 2021-06-19 DIAGNOSIS — D508 Other iron deficiency anemias: Secondary | ICD-10-CM

## 2021-06-19 DIAGNOSIS — K9589 Other complications of other bariatric procedure: Secondary | ICD-10-CM

## 2021-06-19 MED ORDER — CYANOCOBALAMIN 1000 MCG/ML IJ SOLN
1000.0000 ug | Freq: Once | INTRAMUSCULAR | Status: AC
  Administered 2021-06-19: 1000 ug via INTRAMUSCULAR

## 2021-06-21 ENCOUNTER — Encounter: Payer: Self-pay | Admitting: Oncology

## 2021-07-12 ENCOUNTER — Other Ambulatory Visit: Payer: Self-pay

## 2021-07-12 ENCOUNTER — Inpatient Hospital Stay: Payer: 59 | Attending: Internal Medicine

## 2021-07-12 DIAGNOSIS — K9589 Other complications of other bariatric procedure: Secondary | ICD-10-CM

## 2021-07-12 DIAGNOSIS — Z9884 Bariatric surgery status: Secondary | ICD-10-CM | POA: Diagnosis not present

## 2021-07-12 DIAGNOSIS — E538 Deficiency of other specified B group vitamins: Secondary | ICD-10-CM | POA: Insufficient documentation

## 2021-07-12 DIAGNOSIS — D508 Other iron deficiency anemias: Secondary | ICD-10-CM | POA: Diagnosis not present

## 2021-07-12 LAB — CBC WITH DIFFERENTIAL/PLATELET
Abs Immature Granulocytes: 0.03 10*3/uL (ref 0.00–0.07)
Basophils Absolute: 0.1 10*3/uL (ref 0.0–0.1)
Basophils Relative: 1 %
Eosinophils Absolute: 0.2 10*3/uL (ref 0.0–0.5)
Eosinophils Relative: 3 %
HCT: 47.5 % — ABNORMAL HIGH (ref 36.0–46.0)
Hemoglobin: 16.4 g/dL — ABNORMAL HIGH (ref 12.0–15.0)
Immature Granulocytes: 1 %
Lymphocytes Relative: 23 %
Lymphs Abs: 1.3 10*3/uL (ref 0.7–4.0)
MCH: 36.3 pg — ABNORMAL HIGH (ref 26.0–34.0)
MCHC: 34.5 g/dL (ref 30.0–36.0)
MCV: 105.1 fL — ABNORMAL HIGH (ref 80.0–100.0)
Monocytes Absolute: 0.4 10*3/uL (ref 0.1–1.0)
Monocytes Relative: 7 %
Neutro Abs: 3.6 10*3/uL (ref 1.7–7.7)
Neutrophils Relative %: 65 %
Platelets: 217 10*3/uL (ref 150–400)
RBC: 4.52 MIL/uL (ref 3.87–5.11)
RDW: 15.2 % (ref 11.5–15.5)
WBC: 5.5 10*3/uL (ref 4.0–10.5)
nRBC: 0 % (ref 0.0–0.2)

## 2021-07-12 LAB — IRON AND TIBC
Iron: 139 ug/dL (ref 28–170)
Saturation Ratios: 43 % — ABNORMAL HIGH (ref 10.4–31.8)
TIBC: 322 ug/dL (ref 250–450)
UIBC: 183 ug/dL

## 2021-07-12 LAB — FERRITIN: Ferritin: 56 ng/mL (ref 11–307)

## 2021-07-12 LAB — VITAMIN B12: Vitamin B-12: 995 pg/mL — ABNORMAL HIGH (ref 180–914)

## 2021-07-13 ENCOUNTER — Other Ambulatory Visit: Payer: 59

## 2021-07-17 ENCOUNTER — Other Ambulatory Visit: Payer: 59

## 2021-07-17 ENCOUNTER — Inpatient Hospital Stay: Payer: 59

## 2021-07-17 ENCOUNTER — Inpatient Hospital Stay (HOSPITAL_BASED_OUTPATIENT_CLINIC_OR_DEPARTMENT_OTHER): Payer: 59 | Admitting: Internal Medicine

## 2021-07-17 ENCOUNTER — Other Ambulatory Visit: Payer: Self-pay

## 2021-07-17 DIAGNOSIS — E538 Deficiency of other specified B group vitamins: Secondary | ICD-10-CM | POA: Diagnosis not present

## 2021-07-17 DIAGNOSIS — E611 Iron deficiency: Secondary | ICD-10-CM | POA: Diagnosis not present

## 2021-07-17 DIAGNOSIS — D508 Other iron deficiency anemias: Secondary | ICD-10-CM

## 2021-07-17 DIAGNOSIS — K9589 Other complications of other bariatric procedure: Secondary | ICD-10-CM | POA: Diagnosis not present

## 2021-07-17 MED ORDER — CYANOCOBALAMIN 1000 MCG/ML IJ SOLN
1000.0000 ug | Freq: Once | INTRAMUSCULAR | Status: AC
  Administered 2021-07-17: 1000 ug via INTRAMUSCULAR
  Filled 2021-07-17: qty 1

## 2021-07-17 NOTE — Assessment & Plan Note (Addendum)
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 Not anemic today. Hemoglobin 16.   No venofer today.   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 Continue monthly b12. Will check b12 and folate at next visit.   #  Macrocytosis- Etiology unclear. No known liver disease or MDS. B12 deficiency and folate deficiency has been treatment. Previously, bone marrow to rule out MDS if counts decline. Continue to monitor.  Counseled to quit smoking.  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 Patient is cutting back on cigarettes and is not interested in sleep apnea testing. Can discuss with PCP. Work up previously revealed slightly elevated EPO, JAK2, V617F, exons 12-15, CALR, and MPL were negative. Carbon monoxide 11.8% (elevated). Continue to monitor.    # DISPOSITION: # No venofer; B12 today # b12 injections monthly x 6 # RTC in 6 months for labs [cbc, cmp, ferritin, iron studies, folate, b12], See NP day later for evaluation and consideration of venofer +/- b12- Dr.B

## 2021-07-17 NOTE — Progress Notes (Signed)
[facility] CONSULT NOTE  Patient Care Team: Excell Seltzer, MD as PCP - General  CHIEF COMPLAINTS/PURPOSE OF CONSULTATION: Iron deficiency s/p gastric bypass  #  Iron deficiency s/p gastric bypass/ B12 deficiency-    #  Macrocytosis- mild NO  alcohol/liver disease.    #Secondary erythrocytosis  cigarette smoking and sleep apnea. [ elevated EPO, JAK2, V617F, exons 12-15, CALR, and MPL were negative. Carbon monoxide 11.8% (elevated).].    HISTORY OF PRESENTING ILLNESS: Ambulating independently.  Accompanied by her daughter. Brenda Hawkins 55 y.o.  female patient with a history of gastric bypass; iron deficiency is here for follow-up.  Patient received iron infusions approximately 3 months ago.  Hemoglobin normal however low ferritin at the time.  Did not notice to have any significant improvement of energy levels.   She continues to get B12 injections on a monthly basis.   Review of Systems  Constitutional:  Negative for chills, diaphoresis, fever, malaise/fatigue and weight loss.  HENT:  Negative for nosebleeds and sore throat.   Eyes:  Negative for double vision.  Respiratory:  Negative for cough, hemoptysis, sputum production, shortness of breath and wheezing.   Cardiovascular:  Negative for chest pain, palpitations, orthopnea and leg swelling.  Gastrointestinal:  Negative for abdominal pain, blood in stool, constipation, diarrhea, heartburn, melena, nausea and vomiting.  Genitourinary:  Negative for dysuria, frequency and urgency.  Musculoskeletal:  Negative for back pain and joint pain.  Skin: Negative.  Negative for itching and rash.  Neurological:  Negative for dizziness, tingling, focal weakness, weakness and headaches.  Endo/Heme/Allergies:  Does not bruise/bleed easily.  Psychiatric/Behavioral:  Negative for depression. The patient is not nervous/anxious and does not have insomnia.     MEDICAL HISTORY:  Past Medical History:  Diagnosis Date   Anemia     in past   Anxiety    no longer   Asthma    seasonal   Diabetes mellitus type II    no longer   GERD (gastroesophageal reflux disease)    no longer   Obesity    Osteopenia    Sleep apnea    no longer   Symptomatic cholelithiasis 02/05/2019   Urachal cyst 9/01   Vitamin B12 deficiency    since bariatric surgery    SURGICAL HISTORY: Past Surgical History:  Procedure Laterality Date   CHOLECYSTECTOMY N/A 02/17/2019   Procedure: LAPAROSCOPIC CHOLECYSTECTOMY - DIABETIC;  Surgeon: Ancil Linsey, MD;  Location: [facility] ORS;  Service: General;  Laterality: N/A;   CYSTOSCOPY W/ URETERAL STENT PLACEMENT Left 11/11/2018   Procedure: CYSTOSCOPY WITH STENT REMOVAL;  Surgeon: Riki Altes, MD;  Location: [facility]S;  Service: Urology;  Laterality: Left;   CYSTOSCOPY WITH HOLMIUM LASER LITHOTRIPSY Left 10/24/2018   Procedure: CYSTOSCOPY WITH HOLMIUM LASER LITHOTRIPSy;  Surgeon: Crista Elliot, MD;  Location: [facility];  Service: Urology;  Laterality: Left;   CYSTOSCOPY WITH STENT PLACEMENT Bilateral 10/24/2018   Procedure: CYSTOSCOPY WITH STENT PLACEMENT;  Surgeon: Crista Elliot, MD;  Location: [facility];  Service: Urology;  Laterality: Bilateral;   CYSTOSCOPY/URETEROSCOPY/HOLMIUM LASER/STENT PLACEMENT Right 11/11/2018   Procedure: CYSTOSCOPY/URETEROSCOPY/HOLMIUM LASER/STENT EXCHANGE;  Surgeon: Riki Altes, MD;  Location: [facility];  Service: Urology;  Laterality: Right;   ESOPHAGOGASTRODUODENOSCOPY (EGD) WITH PROPOFOL N/A 06/01/2018   Procedure: ESOPHAGOGASTRODUODENOSCOPY (EGD) WITH PROPOFOL;  Surgeon: Toney Reil, MD;  Location: [facility] ENDOSCOPY;  Service: Gastroenterology;  Laterality: N/A;   GALLBLADDER SURGERY  02/17/2019   GASTRIC BYPASS  03/17/02  REDUCTION MAMMAPLASTY Right 1992   URETEROSCOPY Left 10/24/2018   Procedure: URETEROSCOPY;  Surgeon: Crista Elliot, MD;  Location: [facility] ORS;  Service: Urology;  Laterality: Left;    SOCIAL HISTORY: Social History    Socioeconomic History   Marital status: Married    Spouse name: Not on file   Number of children: 1   Years of education: Not on file   Highest education level: Not on file  Occupational History   Occupation: homemaker  Tobacco Use   Smoking status: Every Day    Packs/day: 1.50    Years: 37.00    Pack years: 55.50    Types: Cigarettes   Smokeless tobacco: Never  Vaping Use   Vaping Use: Never used  Substance and Sexual Activity   Alcohol use: No   Drug use: No   Sexual activity: Yes    Birth control/protection: None  Other Topics Concern   Not on file  Social History Narrative   Sporadic with exercise   Social Determinants of Health   Financial Resource Strain: Not on file  Food Insecurity: Not on file  Transportation Needs: Not on file  Physical Activity: Not on file  Stress: Not on file  Social Connections: Not on file  Intimate Partner Violence: Not on file    FAMILY HISTORY: Family History  Problem Relation Age of Onset   Heart disease Mother        valve replacement surgery   Coronary artery disease Father    Breast cancer Neg Hx     ALLERGIES:  is allergic to codeine sulfate, penicillins, adhesive [tape], and latex.  MEDICATIONS:  Current Outpatient Medications  Medication Sig Dispense Refill   albuterol (VENTOLIN HFA) 108 (90 Base) MCG/ACT inhaler Inhale 2 puffs into the lungs every 6 (six) hours as needed for wheezing or shortness of breath. 6.7 g 0   atorvastatin (LIPITOR) 10 MG tablet Take 1 tablet (10 mg total) by mouth daily. 30 tablet 11   esomeprazole (NEXIUM) 40 MG capsule Take 1 capsule (40 mg total) by mouth 2 (two) times daily before a meal. 60 capsule 11   No current facility-administered medications for this visit.   Facility-Administered Medications Ordered in Other Visits  Medication Dose Route Frequency Provider Last Rate Last Admin   cyanocobalamin ((VITAMIN B-12)) injection 1,000 mcg  1,000 mcg Intramuscular Once Louretta Shorten R, MD       sodium chloride 0.9 % injection 10 mL  10 mL Intracatheter PRN Jeralyn Ruths, MD          .  PHYSICAL EXAMINATION:  Vitals:   07/17/21 1346  BP: (!) 147/80  Pulse: 88  Resp: 18  Temp: 97.7 F (36.5 C)  SpO2: 99%   Filed Weights   07/17/21 1344  Weight: 138 lb (62.6 kg)    Physical Exam Vitals and nursing note reviewed.  HENT:     Head: Normocephalic and atraumatic.     Mouth/Throat:     Pharynx: Oropharynx is clear.  Eyes:     Extraocular Movements: Extraocular movements intact.     Pupils: Pupils are equal, round, and reactive to light.  Cardiovascular:     Rate and Rhythm: Normal rate and regular rhythm.  Pulmonary:     Comments: Decreased breath sounds bilaterally.  Abdominal:     Palpations: Abdomen is soft.  Musculoskeletal:        General: Normal range of motion.     Cervical back: Normal range of motion.  Skin:    General: Skin is warm.  Neurological:     General: No focal deficit present.     Mental Status: She is alert and oriented to person, place, and time.  Psychiatric:        Behavior: Behavior normal.        Judgment: Judgment normal.     LABORATORY DATA:  I have reviewed the data as listed Lab Results  Component Value Date   WBC 5.5 07/12/2021   HGB 16.4 (H) 07/12/2021   HCT 47.5 (H) 07/12/2021   MCV 105.1 (H) 07/12/2021   PLT 217 07/12/2021   Recent Labs    09/12/20 0943 02/09/21 0810  NA 141 141  K 3.9 4.3  CL 106 108  CO2 26 20  GLUCOSE 83 68*  BUN 20 24*  CREATININE 0.67 0.65  CALCIUM 9.1 9.7  PROT 7.1 7.2  ALBUMIN 4.3 4.0  AST 15 23  ALT 12 9  ALKPHOS 64 57  BILITOT 0.2 0.1*    RADIOGRAPHIC STUDIES: I have personally reviewed the radiological images as listed and agreed with the findings in the report. No results found.  Iron deficiency anemia following bariatric surgery #  Iron deficiency s/p gastric bypass- intolerant of oral iron and likely has chronic malabsorption secondary to  gastric bypass. Not anemic today. Hemoglobin 16.   No venofer today.    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 Continue monthly b12. Will check b12 and folate at next visit.    #  Macrocytosis- Etiology unclear. No known liver disease or MDS. B12 deficiency and folate deficiency has been treatment. Previously, bone marrow to rule out MDS if counts decline. Continue to monitor.    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 Patient is cutting back on cigarettes and is not interested in sleep apnea testing. Can discuss with PCP. Work up previously revealed slightly elevated EPO, JAK2, V617F, exons 12-15, CALR, and MPL were negative. Carbon monoxide 11.8% (elevated). Continue to monitor.      # DISPOSITION: # No venofer; B12 today # b12 injections monthly x 6 # RTC in 6 months for labs [cbc, cmp, ferritin, iron studies, folate, b12], See NP day later for evaluation and consideration of venofer +/- b12- Dr.B     All questions were answered. The patient knows to call the clinic with any problems, questions or concerns.    Earna Coder, MD 07/17/2021 2:16 PM

## 2021-08-14 ENCOUNTER — Inpatient Hospital Stay: Payer: 59 | Attending: Nurse Practitioner

## 2021-08-14 ENCOUNTER — Other Ambulatory Visit: Payer: Self-pay

## 2021-08-14 DIAGNOSIS — K9589 Other complications of other bariatric procedure: Secondary | ICD-10-CM

## 2021-08-14 DIAGNOSIS — E538 Deficiency of other specified B group vitamins: Secondary | ICD-10-CM | POA: Insufficient documentation

## 2021-08-14 DIAGNOSIS — D508 Other iron deficiency anemias: Secondary | ICD-10-CM

## 2021-08-14 MED ORDER — CYANOCOBALAMIN 1000 MCG/ML IJ SOLN
1000.0000 ug | Freq: Once | INTRAMUSCULAR | Status: AC
  Administered 2021-08-14: 1000 ug via INTRAMUSCULAR
  Filled 2021-08-14: qty 1

## 2021-09-05 ENCOUNTER — Telehealth: Payer: Self-pay | Admitting: Family Medicine

## 2021-09-05 DIAGNOSIS — E119 Type 2 diabetes mellitus without complications: Secondary | ICD-10-CM

## 2021-09-05 NOTE — Telephone Encounter (Signed)
-----   Message from Alvina Chou sent at 09/04/2021  8:26 AM EST ----- Regarding: Lab orders for Tuesday, 1.10.23 Lab orders for a 3 month follow up appt.

## 2021-09-11 ENCOUNTER — Inpatient Hospital Stay: Payer: 59 | Attending: Nurse Practitioner

## 2021-09-18 ENCOUNTER — Other Ambulatory Visit: Payer: 59

## 2021-09-21 ENCOUNTER — Ambulatory Visit: Payer: 59 | Admitting: Family Medicine

## 2021-10-16 ENCOUNTER — Other Ambulatory Visit: Payer: Self-pay

## 2021-10-16 ENCOUNTER — Inpatient Hospital Stay: Payer: 59 | Attending: Nurse Practitioner

## 2021-10-16 DIAGNOSIS — K9589 Other complications of other bariatric procedure: Secondary | ICD-10-CM

## 2021-10-16 DIAGNOSIS — E538 Deficiency of other specified B group vitamins: Secondary | ICD-10-CM | POA: Insufficient documentation

## 2021-10-16 DIAGNOSIS — D508 Other iron deficiency anemias: Secondary | ICD-10-CM

## 2021-10-16 MED ORDER — CYANOCOBALAMIN 1000 MCG/ML IJ SOLN
1000.0000 ug | Freq: Once | INTRAMUSCULAR | Status: AC
  Administered 2021-10-16: 1000 ug via INTRAMUSCULAR
  Filled 2021-10-16: qty 1

## 2021-11-13 ENCOUNTER — Other Ambulatory Visit: Payer: Self-pay

## 2021-11-13 ENCOUNTER — Inpatient Hospital Stay: Payer: 59 | Attending: Nurse Practitioner

## 2021-11-13 DIAGNOSIS — D508 Other iron deficiency anemias: Secondary | ICD-10-CM

## 2021-11-13 DIAGNOSIS — E538 Deficiency of other specified B group vitamins: Secondary | ICD-10-CM | POA: Insufficient documentation

## 2021-11-13 DIAGNOSIS — K9589 Other complications of other bariatric procedure: Secondary | ICD-10-CM

## 2021-11-13 MED ORDER — CYANOCOBALAMIN 1000 MCG/ML IJ SOLN
1000.0000 ug | Freq: Once | INTRAMUSCULAR | Status: AC
  Administered 2021-11-13: 1000 ug via INTRAMUSCULAR
  Filled 2021-11-13: qty 1

## 2021-12-10 ENCOUNTER — Ambulatory Visit
Admission: RE | Admit: 2021-12-10 | Discharge: 2021-12-10 | Disposition: A | Discharge status: Home / Self Care | Payer: 59 | Source: Ambulatory Visit | Attending: Nurse Practitioner | Admitting: Nurse Practitioner

## 2021-12-10 ENCOUNTER — Telehealth: Payer: Self-pay

## 2021-12-10 ENCOUNTER — Ambulatory Visit: Payer: 59 | Admitting: Nurse Practitioner

## 2021-12-10 ENCOUNTER — Encounter: Payer: Self-pay | Admitting: Nurse Practitioner

## 2021-12-10 ENCOUNTER — Other Ambulatory Visit: Payer: Self-pay | Admitting: Nurse Practitioner

## 2021-12-10 VITALS — BP 150/104 | HR 118 | Temp 97.2°F | Resp 14 | Ht 60.0 in | Wt 127.2 lb

## 2021-12-10 DIAGNOSIS — R112 Nausea with vomiting, unspecified: Secondary | ICD-10-CM

## 2021-12-10 DIAGNOSIS — Z87442 Personal history of urinary calculi: Secondary | ICD-10-CM

## 2021-12-10 DIAGNOSIS — N200 Calculus of kidney: Secondary | ICD-10-CM

## 2021-12-10 DIAGNOSIS — R1013 Epigastric pain: Secondary | ICD-10-CM | POA: Diagnosis not present

## 2021-12-10 DIAGNOSIS — K2100 Gastro-esophageal reflux disease with esophagitis, without bleeding: Secondary | ICD-10-CM

## 2021-12-10 DIAGNOSIS — K253 Acute gastric ulcer without hemorrhage or perforation: Secondary | ICD-10-CM

## 2021-12-10 DIAGNOSIS — R109 Unspecified abdominal pain: Secondary | ICD-10-CM | POA: Insufficient documentation

## 2021-12-10 DIAGNOSIS — I700 Atherosclerosis of aorta: Secondary | ICD-10-CM | POA: Diagnosis not present

## 2021-12-10 DIAGNOSIS — R1084 Generalized abdominal pain: Secondary | ICD-10-CM | POA: Diagnosis not present

## 2021-12-10 DIAGNOSIS — Z760 Encounter for issue of repeat prescription: Secondary | ICD-10-CM

## 2021-12-10 LAB — LIPID PANEL
Cholesterol: 145 mg/dL (ref 0–200)
HDL: 26.4 mg/dL — ABNORMAL LOW (ref >39.00)
NonHDL: 118.36
Total CHOL/HDL Ratio: 5
Triglycerides: 255 mg/dL — ABNORMAL HIGH (ref 0.0–149.0)
VLDL: 51 mg/dL — ABNORMAL HIGH (ref 0.0–40.0)

## 2021-12-10 LAB — POCT URINALYSIS DIPSTICK
Bilirubin, UA: NEGATIVE
Glucose, UA: NEGATIVE
Ketones, UA: POSITIVE
Leukocytes, UA: NEGATIVE
Nitrite, UA: NEGATIVE
Protein, UA: POSITIVE — AB
Spec Grav, UA: 1.02 (ref 1.010–1.025)
Urobilinogen, UA: 1 E.U./dL
pH, UA: 6 (ref 5.0–8.0)

## 2021-12-10 LAB — COMPREHENSIVE METABOLIC PANEL WITH GFR
ALT: 8 U/L (ref 0–35)
AST: 15 U/L (ref 0–37)
Albumin: 4.2 g/dL (ref 3.5–5.2)
Alkaline Phosphatase: 80 U/L (ref 39–117)
BUN: 19 mg/dL (ref 6–23)
CO2: 25 meq/L (ref 19–32)
Calcium: 10.1 mg/dL (ref 8.4–10.5)
Chloride: 99 meq/L (ref 96–112)
Creatinine, Ser: 0.63 mg/dL (ref 0.40–1.20)
GFR: 98.7 mL/min
Glucose, Bld: 122 mg/dL — ABNORMAL HIGH (ref 70–99)
Potassium: 3.4 meq/L — ABNORMAL LOW (ref 3.5–5.1)
Sodium: 137 meq/L (ref 135–145)
Total Bilirubin: 0.4 mg/dL (ref 0.2–1.2)
Total Protein: 7.5 g/dL (ref 6.0–8.3)

## 2021-12-10 LAB — CBC
HCT: 49.7 % — ABNORMAL HIGH (ref 36.0–46.0)
Hemoglobin: 17.2 g/dL — ABNORMAL HIGH (ref 12.0–15.0)
MCHC: 34.7 g/dL (ref 30.0–36.0)
MCV: 108.6 fl — ABNORMAL HIGH (ref 78.0–100.0)
Platelets: 257 10*3/uL (ref 150.0–400.0)
RBC: 4.58 Mil/uL (ref 3.87–5.11)
RDW: 14.1 % (ref 11.5–15.5)
WBC: 9.3 10*3/uL (ref 4.0–10.5)

## 2021-12-10 LAB — POCT URINE PREGNANCY: Preg Test, Ur: NEGATIVE

## 2021-12-10 LAB — LIPASE: Lipase: 20 U/L (ref 11.0–59.0)

## 2021-12-10 LAB — LDL CHOLESTEROL, DIRECT: Direct LDL: 87 mg/dL

## 2021-12-10 MED ORDER — KETOROLAC TROMETHAMINE 30 MG/ML IJ SOLN
30.0000 mg | Freq: Once | INTRAMUSCULAR | Status: AC
  Administered 2021-12-10: 30 mg via INTRAMUSCULAR

## 2021-12-10 MED ORDER — ATORVASTATIN CALCIUM 10 MG PO TABS: 10.0000 mg | ORAL_TABLET | Freq: Every day | ORAL | 2 refills | Status: DC

## 2021-12-10 MED ORDER — OXYCODONE HCL 5 MG PO TABS: 5.0000 mg | ORAL_TABLET | Freq: Four times a day (QID) | ORAL | 0 refills | Status: AC | PRN

## 2021-12-10 MED ORDER — PROMETHAZINE HCL 25 MG/ML IJ SOLN
25.0000 mg | Freq: Once | INTRAMUSCULAR | Status: AC
  Administered 2021-12-10: 25 mg via INTRAMUSCULAR

## 2021-12-10 MED ORDER — PROMETHAZINE HCL 50 MG/ML IJ SOLN: 25.0000 mg | Freq: Once | INTRAMUSCULAR | Status: DC

## 2021-12-10 MED ORDER — ESOMEPRAZOLE MAGNESIUM 40 MG PO CPDR: 40.0000 mg | DELAYED_RELEASE_CAPSULE | Freq: Two times a day (BID) | ORAL | 2 refills | Status: DC

## 2021-12-10 MED ORDER — ONDANSETRON 4 MG PO TBDP: 4.0000 mg | ORAL_TABLET | Freq: Three times a day (TID) | ORAL | 0 refills | Status: DC | PRN

## 2021-12-10 NOTE — Assessment & Plan Note (Signed)
History of PUD and gastric bypass. Out of PPI. Will refill  ?

## 2021-12-10 NOTE — Telephone Encounter (Signed)
Okay; thanks.

## 2021-12-10 NOTE — Assessment & Plan Note (Signed)
History of same last CT scan was 2020 and showed on in [place] and several non obstructing stones noticed ?

## 2021-12-10 NOTE — Telephone Encounter (Signed)
[facility] called to let know that the report for this patient is in and wanted you look at this asap.  ?

## 2021-12-10 NOTE — Assessment & Plan Note (Signed)
Given pain at 5/10 and when intermittent shooting pain 12/10 history of kindey stone and 3+ blood on UA stat CT renal study to r/o nephrolithasis. ?

## 2021-12-10 NOTE — Assessment & Plan Note (Signed)
Secondary to likely nephrolithasis. Promethazine 25mg  IM once given in office. Patient has a driver and sedation precautions given ?

## 2021-12-10 NOTE — Progress Notes (Signed)
? ?Acute Office Visit ? ?Subjective:  ? ? Patient ID: Brenda Hawkins, female    DOB: 1966/02/20, 56 y.o.   MRN: [id number] ? ?Chief Complaint  ?Patient presents with  ? Nausea  ?  Got worse on 12/06/21. Has had spells of nausea off and on for a month. Abdominal pain across top stomach and lower stomach, back pain upper and lower started. Vomiting over the weekend and diarrhea. NO fever.  ? ? ?HPI ?Patient is in today for nausea/ abdominal pain and flank pain ? ?Symptoms started approx 1 month ago intermittent.  ?Thursday it became constant and unbearable. She is having abdominal pain and back pain.  States constant pain 5/10 and then intermittent shooting pain that is across bilateral lower abdomen  ? ?Constant upper epigastric pain. Patient has a history of PUD and gastric surgery. Has been out of her PPI and statin medication for at least a month. Denies using NSAIDs. ? ? Tylenol helped at first and now it is not helping ?Past Medical History:  ?Diagnosis Date  ? Anemia   ? in past  ? Anxiety   ? no longer  ? Asthma   ? seasonal  ? Diabetes mellitus type II   ? no longer  ? GERD (gastroesophageal reflux disease)   ? no longer  ? Obesity   ? Osteopenia   ? Sleep apnea   ? no longer  ? Symptomatic cholelithiasis 02/05/2019  ? Urachal cyst 9/01  ? Vitamin B12 deficiency   ? since bariatric surgery  ? ? ?Past Surgical History:  ?Procedure Laterality Date  ? CHOLECYSTECTOMY N/A 02/17/2019  ? Procedure: LAPAROSCOPIC CHOLECYSTECTOMY - DIABETIC;  Surgeon: Ancil Linseyavis, Jason Evan, MD;  Location: [facility];  Service: General;  Laterality: N/A;  ? CYSTOSCOPY W/ URETERAL STENT PLACEMENT Left 11/11/2018  ? Procedure: CYSTOSCOPY WITH STENT REMOVAL;  Surgeon: Riki AltesStoioff, Scott C, MD;  Location: [facility] OR[facility];  Service: Urology;  Laterality: Left;  ? CYSTOSCOPY WITH HOLMIUM LASER LITHOTRIPSY Left 10/24/2018  ? Procedure: CYSTOSCOPY WITH HOLMIUM LASER LITHOTRIPSy;  Surgeon: Crista ElliotBell, Eugene D III, MD;  Location: [facility] OR[facility];  Service: Urology;  Laterality:  Left;  ? CYSTOSCOPY WITH STENT PLACEMENT Bilateral 10/24/2018  ? Procedure: CYSTOSCOPY WITH STENT PLACEMENT;  Surgeon: Crista ElliotBell, Eugene D III, MD;  Location: [facility] OR[facility];  Service: Urology;  Laterality: Bilateral;  ? CYSTOSCOPY/URETEROSCOPY/HOLMIUM LASER/STENT PLACEMENT Right 11/11/2018  ? Procedure: CYSTOSCOPY/URETEROSCOPY/HOLMIUM LASER/STENT EXCHANGE;  Surgeon: Riki AltesStoioff, Scott C, MD;  Location: [facility];  Service: Urology;  Laterality: Right;  ? ESOPHAGOGASTRODUODENOSCOPY (EGD) WITH PROPOFOL N/A 06/01/2018  ? Procedure: ESOPHAGOGASTRODUODENOSCOPY (EGD) WITH PROPOFOL;  Surgeon: Toney ReilVanga, Rohini Reddy, MD;  [facility];  Service: Gastroenterology;  Laterality: N/A;  ? GALLBLADDER SURGERY  02/17/2019  ? GASTRIC BYPASS  03/17/02  ? REDUCTION MAMMAPLASTY Right 1992  ? URETEROSCOPY Left 10/24/2018  ? Procedure: URETEROSCOPY;  Surgeon: Crista ElliotBell, Eugene D III, MD;  Location: [facility] ORS;  Service: Urology;  Laterality: Left;  ? ? ?Family History  ?Problem Relation Age of Onset  ? Heart disease Mother   ?     valve replacement surgery  ? Coronary artery disease Father   ? Breast cancer Neg Hx   ? ? ?Social History  ? ?Socioeconomic History  ? Marital status: Married  ?  Spouse name: Not on file  ? Number of children: 1  ? Years of education: Not on file  ? Highest education level: Not on file  ?Occupational History  ? Occupation: homemaker  ?Tobacco Use  ?  Smoking status: Every Day  ?  Packs/day: 1.50  ?  Years: 37.00  ?  Pack years: 55.50  ?  Types: Cigarettes  ? Smokeless tobacco: Never  ?Vaping Use  ? Vaping Use: Never used  ?Substance and Sexual Activity  ? Alcohol use: No  ? Drug use: No  ? Sexual activity: Yes  ?  Birth control/protection: None  ?Other Topics Concern  ? Not on file  ?Social History Narrative  ? Sporadic with exercise  ? ?Social Determinants of Health  ? ?Financial Resource Strain: Not on file  ?Food Insecurity: Not on file  ?Transportation Needs: Not on file  ?Physical Activity: Not on file  ?Stress: Not on file   ?Social Connections: Not on file  ?Intimate Partner Violence: Not on file  ? ? ?Outpatient Medications Prior to Visit  ?Medication Sig Dispense Refill  ? albuterol (VENTOLIN HFA) 108 (90 Base) MCG/ACT inhaler Inhale 2 puffs into the lungs every 6 (six) hours as needed for wheezing or shortness of breath. 6.7 g 0  ? atorvastatin (LIPITOR) 10 MG tablet Take 1 tablet (10 mg total) by mouth daily. (Patient not taking: Reported on 12/10/2021) 30 tablet 11  ? esomeprazole (NEXIUM) 40 MG capsule Take 1 capsule (40 mg total) by mouth 2 (two) times daily before a meal. (Patient not taking: Reported on 12/10/2021) 60 capsule 11  ? ?Facility-Administered Medications Prior to Visit  ?Medication Dose Route Frequency Provider Last Rate Last Admin  ? sodium chloride 0.9 % injection 10 mL  10 mL Intracatheter PRN Jeralyn Ruths, MD      ? ? ?Allergies  ?Allergen Reactions  ? Codeine Sulfate Other (See Comments)  ?  Reaction: "violently ill"  ? Penicillins Other (See Comments)  ?  Did it involve swelling of the face/tongue/throat, SOB, or low BP? Unknown ?Did it involve sudden or severe rash/hives, skin peeling, or any reaction on the inside of your mouth or nose? Unknown ?Did you need to seek medical attention at a hospital or doctor's office? Unknown ?When did it last happen?      Childhood allergy ?If all above answers are ?NO?, may proceed with cephalosporin use. ?  ? Adhesive [Tape] Itching and Rash  ?  PAPER TAPE IS FINE  ? Latex Itching and Rash  ? ? ?Review of Systems  ?Constitutional:  Positive for fever (subjective). Negative for chills and fatigue.  ?Respiratory:  Negative for cough and shortness of breath.   ?Gastrointestinal:  Positive for abdominal pain, diarrhea, nausea and vomiting.  ?Genitourinary:  Positive for decreased urine volume.  ?     Frequency.   ?Neurological:  Positive for light-headedness and headaches. Negative for dizziness.  ? ?   ?Objective:  ?  ?Physical Exam ?Vitals and nursing note reviewed.   ?Constitutional:   ?   Appearance: Normal appearance.  ?HENT:  ?   Right Ear: Tympanic membrane, ear canal and external ear normal.  ?   Left Ear: Tympanic membrane, ear canal and external ear normal.  ?   Mouth/Throat:  ?   Mouth: Mucous membranes are moist.  ?   Pharynx: Oropharynx is clear.  ?Eyes:  ?   Pupils: Pupils are equal, round, and reactive to light.  ?Cardiovascular:  ?   Rate and Rhythm: Normal rate and regular rhythm.  ?   Heart sounds: Normal heart sounds.  ?Pulmonary:  ?   Effort: Pulmonary effort is normal.  ?   Breath sounds: Normal breath sounds.  ?Abdominal:  ?  General: Bowel sounds are normal. There is no distension.  ?   Palpations: There is no mass.  ?   Tenderness: There is abdominal tenderness. There is right CVA tenderness and left CVA tenderness.  ?   Hernia: No hernia is present.  ?Neurological:  ?   General: No focal deficit present.  ?   Mental Status: She is alert.  ?   Deep Tendon Reflexes:  ?   Reflex Scores: ?     Bicep reflexes are 2+ on the right side and 2+ on the left side. ?     Patellar reflexes are 2+ on the right side and 2+ on the left side. ?   Comments: Bilateral upper and lower extremity strength 5/5  ? ? ?BP (!) 150/104   Pulse (!) 118   Temp (!) 97.2 ?F (36.2 ?C)   Resp 14   Ht 5' (1.524 m)   Wt 127 lb 3 oz (57.7 kg)   SpO2 98%   BMI 24.84 kg/m?  ?Wt Readings from Last 3 Encounters:  ?12/10/21 127 lb 3 oz (57.7 kg)  ?07/17/21 138 lb (62.6 kg)  ?02/23/21 148 lb 8 oz (67.4 kg)  ? ? ?Health Maintenance Due  ?Topic Date Due  ? COVID-19 Vaccine (1) Never done  ? COLONOSCOPY (Pts 45-14yrs Insurance coverage will need to be confirmed)  Never done  ? Zoster Vaccines- Shingrix (1 of 2) Never done  ? PAP SMEAR-Modifier  02/21/2015  ? TETANUS/TDAP  05/21/2015  ? COLON CANCER SCREENING ANNUAL FOBT  01/02/2016  ? MAMMOGRAM  05/30/2019  ? FOOT EXAM  03/10/2021  ? HEMOGLOBIN A1C  08/11/2021  ? URINE MICROALBUMIN  02/09/2022  ? ? ?There are no preventive care reminders to  display for this patient. ? ? ?Lab Results  ?Component Value Date  ? TSH 3.03 09/09/2019  ? ?Lab Results  ?Component Value Date  ? WBC 5.5 07/12/2021  ? HGB 16.4 (H) 07/12/2021  ? HCT 47.5 (H) 07/12/2021  ? MCV

## 2021-12-10 NOTE — Telephone Encounter (Signed)
I have reviewed the scan and already spoke to the patient about the results. ? ?Thank you ?

## 2021-12-10 NOTE — Assessment & Plan Note (Signed)
Been out of her PPi for 2 months. States she needs a refill ?

## 2021-12-10 NOTE — Patient Instructions (Signed)
Nice to see you today ?I will be in touch with the scan and lab results ?Follow up if no improvement or symptoms worsen ? ?I will send all medications in at the same time once I have the results ?

## 2021-12-11 ENCOUNTER — Inpatient Hospital Stay: Payer: 59 | Attending: Nurse Practitioner

## 2021-12-11 LAB — URINE CULTURE
MICRO NUMBER:: 13214367
Result:: NO GROWTH
SPECIMEN QUALITY:: ADEQUATE

## 2021-12-12 ENCOUNTER — Other Ambulatory Visit
Admission: RE | Admit: 2021-12-12 | Discharge: 2021-12-12 | Disposition: A | Discharge status: Home / Self Care | Payer: 59 | Attending: Urology | Admitting: Urology

## 2021-12-12 ENCOUNTER — Ambulatory Visit (INDEPENDENT_AMBULATORY_CARE_PROVIDER_SITE_OTHER): Payer: 59 | Admitting: Urology

## 2021-12-12 ENCOUNTER — Other Ambulatory Visit: Payer: Self-pay | Admitting: *Deleted

## 2021-12-12 ENCOUNTER — Encounter: Payer: Self-pay | Admitting: Urology

## 2021-12-12 VITALS — BP 162/100 | HR 101 | Ht 60.0 in | Wt 129.0 lb

## 2021-12-12 DIAGNOSIS — Z87442 Personal history of urinary calculi: Secondary | ICD-10-CM | POA: Diagnosis present

## 2021-12-12 DIAGNOSIS — N200 Calculus of kidney: Secondary | ICD-10-CM | POA: Diagnosis not present

## 2021-12-12 LAB — URINALYSIS, COMPLETE (UACMP) WITH MICROSCOPIC
Glucose, UA: NEGATIVE mg/dL
Leukocytes,Ua: NEGATIVE
Nitrite: NEGATIVE
Protein, ur: 100 mg/dL — AB
Specific Gravity, Urine: 1.025 (ref 1.005–1.030)
pH: 6 (ref 5.0–8.0)

## 2021-12-12 MED ORDER — TAMSULOSIN HCL 0.4 MG PO CAPS: 0.4000 mg | ORAL_CAPSULE | Freq: Every day | ORAL | 0 refills | Status: DC

## 2021-12-12 NOTE — Patient Instructions (Signed)
Dietary Guidelines to Help Prevent Kidney Stones Kidney stones are deposits of minerals and salts that form inside your kidneys. Your risk of developing kidney stones may be greater depending on your diet, your lifestyle, the medicines you take, and whether you have certain medical conditions. Most people can lower their chances of developing kidney stones by following the instructions below. Your dietitian may give you more specific instructions depending on your overall health and the type of kidney stones you tend to develop. What are tips for following this plan? Reading food labels  Choose foods with "no salt added" or "low-salt" labels. Limit your salt (sodium) intake to less than 1,500 mg a day. Choose foods with calcium for each meal and snack. Try to eat about 300 mg of calcium at each meal. Foods that contain 200-500 mg of calcium a serving include: 8 oz (237 mL) of milk, calcium-fortifiednon-dairy milk, and calcium-fortifiedfruit juice. Calcium-fortified means that calcium has been added to these drinks. 8 oz (237 mL) of kefir, yogurt, and soy yogurt. 4 oz (114 g) of tofu. 1 oz (28 g) of cheese. 1 cup (150 g) of dried figs. 1 cup (91 g) of cooked broccoli. One 3 oz (85 g) can of sardines or mackerel. Most people need 1,000-1,500 mg of calcium a day. Talk to your dietitian about how much calcium is recommended for you. Shopping Buy plenty of fresh fruits and vegetables. Most people do not need to avoid fruits and vegetables, even if these foods contain nutrients that may contribute to kidney stones. When shopping for convenience foods, choose: Whole pieces of fruit. Pre-made salads with dressing on the side. Low-fat fruit and yogurt smoothies. Avoid buying frozen meals or prepared deli foods. These can be high in sodium. Look for foods with live cultures, such as yogurt and kefir. Choose high-fiber grains, such as whole-wheat breads, oat bran, and wheat cereals. Cooking Do not add  salt to food when cooking. Place a salt shaker on the table and allow each person to add his or her own salt to taste. Use vegetable protein, such as beans, textured vegetable protein (TVP), or tofu, instead of meat in pasta, casseroles, and soups. Meal planning Eat less salt, if told by your dietitian. To do this: Avoid eating processed or pre-made food. Avoid eating fast food. Eat less animal protein, including cheese, meat, poultry, or fish, if told by your dietitian. To do this: Limit the number of times you have meat, poultry, fish, or cheese each week. Eat a diet free of meat at least 2 days a week. Eat only one serving each day of meat, poultry, fish, or seafood. When you prepare animal protein, cut pieces into small portion sizes. For most meat and fish, one serving is about the size of the palm of your hand. Eat at least five servings of fresh fruits and vegetables each day. To do this: Keep fruits and vegetables on hand for snacks. Eat one piece of fruit or a handful of berries with breakfast. Have a salad and fruit at lunch. Have two kinds of vegetables at dinner. Limit foods that are high in a substance called oxalate. These include: Spinach (cooked), rhubarb, beets, sweet potatoes, and Swiss chard. Peanuts. Potato chips, french fries, and baked potatoes with skin on. Nuts and nut products. Chocolate. If you regularly take a diuretic medicine, make sure to eat at least 1 or 2 servings of fruits or vegetables that are high in potassium each day. These include: Avocado. Banana. Orange, prune,   carrot, or tomato juice. Baked potato. Cabbage. Beans and split peas. Lifestyle  Drink enough fluid to keep your urine pale yellow. This is the most important thing you can do. Spread your fluid intake throughout the day. If you drink alcohol: Limit how much you use to: 0-1 drink a day for women who are not pregnant. 0-2 drinks a day for men. Be aware of how much alcohol is in your  drink. In the [place] one drink equals one 12 oz bottle of beer (355 mL), one 5 oz glass of wine (148 mL), or one 1 oz glass of hard liquor (44 mL). Lose weight if told by your health care provider. Work with your dietitian to find an eating plan and weight loss strategies that work best for you. General information Talk to your health care provider and dietitian about taking daily supplements. You may be told the following depending on your health and the cause of your kidney stones: Not to take supplements with vitamin C. To take a calcium supplement. To take a daily probiotic supplement. To take other supplements such as magnesium, fish oil, or vitamin B6. Take over-the-counter and prescription medicines only as told by your health care provider. These include supplements. What foods should I limit? Limit your intake of the following foods, or eat them as told by your dietitian. Vegetables Spinach. Rhubarb. Beets. Canned vegetables. Pickles. Olives. Baked potatoes with skin. Grains Wheat bran. Baked goods. Salted crackers. Cereals high in sugar. Meats and other proteins Nuts. Nut butters. Large portions of meat, poultry, or fish. Salted, precooked, or cured meats, such as sausages, meat loaves, and hot dogs. Dairy Cheese. Beverages Regular soft drinks. Regular vegetable juice. Seasonings and condiments Seasoning blends with salt. Salad dressings. Soy sauce. Ketchup. Barbecue sauce. Other foods Canned soups. Canned pasta sauce. Casseroles. Pizza. Lasagna. Frozen meals. Potato chips. French fries. The items listed above may not be a complete list of foods and beverages you should limit. Contact a dietitian for more information. What foods should I avoid? Talk to your dietitian about specific foods you should avoid based on the type of kidney stones you have and your overall health. Fruits Grapefruit. The item listed above may not be a complete list of foods and beverages you should  avoid. Contact a dietitian for more information. Summary Kidney stones are deposits of minerals and salts that form inside your kidneys. You can lower your risk of kidney stones by making changes to your diet. The most important thing you can do is drink enough fluid. Drink enough fluid to keep your urine pale yellow. Talk to your dietitian about how much calcium you should have each day, and eat less salt and animal protein as told by your dietitian. This information is not intended to replace advice given to you by your health care provider. Make sure you discuss any questions you have with your health care provider. Document Revised: 08/19/2019 Document Reviewed: 08/19/2019 Elsevier Patient Education  2022 Elsevier Inc.  

## 2021-12-12 NOTE — Progress Notes (Signed)
? ?  12/12/2021 ?9:54 AM  ? ?Konrad Felix Delcid ?1966-07-08 ?[id number] ? ?Reason for visit: Nephrolithiasis ? ?HPI: ?56 year old female history of gastric bypass who had been followed previously by Dr. Lonna Cobb for recurrent nephrolithiasis.  Her last surgery was in March 2024 right-sided ureteral stones.  She reports about a week of lower pelvic and lower back pain as well as some epigastric pain.  She had a CT stone protocol dated 12/10/2021 with PCP that showed a possible gastric ulcer, as well as small ureteral distal stones bilaterally with no evidence of hydronephrosis.  I personally viewed and interpreted the CT that shows a likely 3 mm distal left ureteral stone with no hydronephrosis, and a possible 1 mm right distal ureteral stone though difficult to tell if this is a phlebolith, and no hydronephrosis seen.  There are small bilateral nonobstructing renal stones.  Urinalysis today contaminated with 20-50 squamous cells, 11-20 WBCs, 11-20 RBCs, many bacteria, nitrite negative, negative leukocytes.  She reports that her lower back and abdominal pain has resolved since Monday and she has been overall doing well.  She denies any fevers or chills.  She continues to make urine.  She also had a CBC/CMP at that visit and renal function was normal with creatinine of 0.63, and there was no leukocytosis. ? ?We discussed options including bilateral ureteroscopy and laser lithotripsy for her possible bilateral small ureteral stones, versus continuing with a trial of medical expulsive therapy.  Risk and benefits were discussed extensively, as well as return precautions including anuria or fevers/chills/UTI symptoms.  With her lack of hydronephrosis and very small stones bilaterally, she opts for trial of medical expulsive therapy which I think is reasonable. ? ?-Trial of Flomax nightly x2 weeks ?-Offered pain meds, she is currently comfortable with Tylenol and really has not had pain over the last few days ?-Return precautions  discussed extensively ?-RTC 2 weeks symptom check, repeat UA ? ? ? ? ?Sondra Come, MD ? ?[facility] ?[address] ?[place], [place] [address] ?[phone] ? ? ?

## 2021-12-26 ENCOUNTER — Other Ambulatory Visit: Payer: Self-pay | Admitting: *Deleted

## 2021-12-26 ENCOUNTER — Other Ambulatory Visit
Admission: RE | Admit: 2021-12-26 | Discharge: 2021-12-26 | Disposition: A | Discharge status: Home / Self Care | Payer: 59 | Attending: Urology | Admitting: Urology

## 2021-12-26 ENCOUNTER — Encounter: Payer: Self-pay | Admitting: Urology

## 2021-12-26 ENCOUNTER — Ambulatory Visit (INDEPENDENT_AMBULATORY_CARE_PROVIDER_SITE_OTHER): Payer: 59 | Admitting: Urology

## 2021-12-26 VITALS — BP 172/111 | HR 96 | Ht 60.0 in | Wt 129.0 lb

## 2021-12-26 DIAGNOSIS — Z87442 Personal history of urinary calculi: Secondary | ICD-10-CM

## 2021-12-26 DIAGNOSIS — N200 Calculus of kidney: Secondary | ICD-10-CM | POA: Diagnosis present

## 2021-12-26 LAB — URINALYSIS, COMPLETE (UACMP) WITH MICROSCOPIC
Bilirubin Urine: NEGATIVE
Glucose, UA: NEGATIVE mg/dL
Ketones, ur: NEGATIVE mg/dL
Leukocytes,Ua: NEGATIVE
Nitrite: NEGATIVE
Protein, ur: NEGATIVE mg/dL
Specific Gravity, Urine: 1.015 (ref 1.005–1.030)
pH: 6 (ref 5.0–8.0)

## 2021-12-26 NOTE — Progress Notes (Signed)
? ?  12/26/2021 ?10:16 AM  ? ?Brenda Hawkins ?29-Sep-1965 ?[id number] ? ?Reason for visit: Follow up nephrolithiasis ? ?HPI: ?56 year old female with history of gastric bypass who has had recurrent nephrolithiasis.  She had surgery in February and March 2020 for bilateral stones.  I saw her on 12/12/2021 for some lower abdominal and flank pain with CT on 12/10/2021 that showed a 3 mm left distal ureteral stone and a possible 1 mm right distal ureteral stone versus phlebolith, with no hydronephrosis on either side.  Urinalysis was contaminated, and her pain had resolved and labs were benign.  She opted for trial of medical expulsive therapy.  She has done very well over the last few weeks and really denies any complaints.  She has had a few twinges of abdominal pain but nothing that has required Tylenol or any pain medications.  She denies any gross hematuria or urinary symptoms. ? ?Urinalysis today is completely benign ? ?We discussed options including ongoing observation or repeat KUB/CT.  Since she is feeling well she would like to continue with observation.  Return precautions were discussed ? ?We discussed general stone prevention strategies including adequate hydration with goal of producing 2.5 L of urine daily, increasing citric acid intake, increasing calcium intake during high oxalate meals, minimizing animal protein, and decreasing salt intake. Information about dietary recommendations given today.  We reviewed the increased risk of nephrolithiasis with gastric bypass and the importance of avoiding high oxalate foods and consuming a normal amount of calcium, especially with meals. ? ?RTC 1 year KUB for stone surveillance ? ? ?Billey Co, MD ? ?[facility] ?[address] ?[place], [place] [address] ?[phone] ? ? ?

## 2021-12-26 NOTE — Patient Instructions (Signed)
With your history of gastric bypass, it is extremely important to avoid high oxalate foods, and take in plenty of calcium with meals.  Drinking plenty of fluids, and adding citrate to beverages with lemon juice/lemonade/Crystal light lemonade can also be very helpful. ? ?Dietary Guidelines to Help Prevent Kidney Stones ?Kidney stones are deposits of minerals and salts that form inside your kidneys. Your risk of developing kidney stones may be greater depending on your diet, your lifestyle, the medicines you take, and whether you have certain medical conditions. Most people can lower their chances of developing kidney stones by following the instructions below. Your dietitian may give you more specific instructions depending on your overall health and the type of kidney stones you tend to develop. ?What are tips for following this plan? ?Reading food labels ? ?Choose foods with "no salt added" or "low-salt" labels. Limit your salt (sodium) intake to less than 1,500 mg a day. ?Choose foods with calcium for each meal and snack. Try to eat about 300 mg of calcium at each meal. Foods that contain 200-500 mg of calcium a serving include: ?8 oz (237 mL) of milk, calcium-fortifiednon-dairy milk, and calcium-fortifiedfruit juice. Calcium-fortified means that calcium has been added to these drinks. ?8 oz (237 mL) of kefir, yogurt, and soy yogurt. ?4 oz (114 g) of tofu. ?1 oz (28 g) of cheese. ?1 cup (150 g) of dried figs. ?1 cup (91 g) of cooked broccoli. ?One 3 oz (85 g) can of sardines or mackerel. ?Most people need 1,000-1,500 mg of calcium a day. Talk to your dietitian about how much calcium is recommended for you. ?Shopping ?Buy plenty of fresh fruits and vegetables. Most people do not need to avoid fruits and vegetables, even if these foods contain nutrients that may contribute to kidney stones. ?When shopping for convenience foods, choose: ?Whole pieces of fruit. ?Pre-made salads with dressing on the side. ?Low-fat  fruit and yogurt smoothies. ?Avoid buying frozen meals or prepared deli foods. These can be high in sodium. ?Look for foods with live cultures, such as yogurt and kefir. ?Choose high-fiber grains, such as whole-wheat breads, oat bran, and wheat cereals. ?Cooking ?Do not add salt to food when cooking. Place a salt shaker on the table and allow each person to add his or her own salt to taste. ?Use vegetable protein, such as beans, textured vegetable protein (TVP), or tofu, instead of meat in pasta, casseroles, and soups. ?Meal planning ?Eat less salt, if told by your dietitian. To do this: ?Avoid eating processed or pre-made food. ?Avoid eating fast food. ?Eat less animal protein, including cheese, meat, poultry, or fish, if told by your dietitian. To do this: ?Limit the number of times you have meat, poultry, fish, or cheese each week. Eat a diet free of meat at least 2 days a week. ?Eat only one serving each day of meat, poultry, fish, or seafood. ?When you prepare animal protein, cut pieces into small portion sizes. For most meat and fish, one serving is about the size of the palm of your hand. ?Eat at least five servings of fresh fruits and vegetables each day. To do this: ?Keep fruits and vegetables on hand for snacks. ?Eat one piece of fruit or a handful of berries with breakfast. ?Have a salad and fruit at lunch. ?Have two kinds of vegetables at dinner. ?Limit foods that are high in a substance called oxalate. These include: ?Spinach (cooked), rhubarb, beets, sweet potatoes, and Swiss chard. ?Peanuts. ?Potato chips, french fries, and  baked potatoes with skin on. ?Nuts and nut products. ?Chocolate. ?If you regularly take a diuretic medicine, make sure to eat at least 1 or 2 servings of fruits or vegetables that are high in potassium each day. These include: ?Avocado. ?Banana. ?Orange, prune, carrot, or tomato juice. ?Baked potato. ?Cabbage. ?Beans and split peas. ?Lifestyle ? ?Drink enough fluid to keep your  urine pale yellow. This is the most important thing you can do. Spread your fluid intake throughout the day. ?If you drink alcohol: ?Limit how much you use to: ?0-1 drink a day for women who are not pregnant. ?0-2 drinks a day for men. ?Be aware of how much alcohol is in your drink. In the [place] one drink equals one 12 oz bottle of beer (355 mL), one 5 oz glass of wine (148 mL), or one 1? oz glass of hard liquor (44 mL). ?Lose weight if told by your health care provider. Work with your dietitian to find an eating plan and weight loss strategies that work best for you. ?General information ?Talk to your health care provider and dietitian about taking daily supplements. You may be told the following depending on your health and the cause of your kidney stones: ?Not to take supplements with vitamin C. ?To take a calcium supplement. ?To take a daily probiotic supplement. ?To take other supplements such as magnesium, fish oil, or vitamin B6. ?Take over-the-counter and prescription medicines only as told by your health care provider. These include supplements. ?What foods should I limit? ?Limit your intake of the following foods, or eat them as told by your dietitian. ?Vegetables ?Spinach. Rhubarb. Beets. Canned vegetables. [place]. Olives. Baked potatoes with skin. ?Grains ?Wheat bran. Baked goods. Salted crackers. Cereals high in sugar. ?Meats and other proteins ?Nuts. Nut butters. Large portions of meat, poultry, or fish. Salted, precooked, or cured meats, such as sausages, meat loaves, and hot dogs. ?Dairy ?Cheese. ?Beverages ?Regular soft drinks. Regular vegetable juice. ?Seasonings and condiments ?Seasoning blends with salt. Salad dressings. Soy sauce. Ketchup. Barbecue sauce. ?Other foods ?Canned soups. Canned pasta sauce. Casseroles. Pizza. Lasagna. Frozen meals. Potato chips. [place] fries. ?The items listed above may not be a complete list of foods and beverages you should limit. Contact a dietitian for more  information. ?What foods should I avoid? ?Talk to your dietitian about specific foods you should avoid based on the type of kidney stones you have and your overall health. ?Fruits ?Grapefruit. ?The item listed above may not be a complete list of foods and beverages you should avoid. Contact a dietitian for more information. ?Summary ?Kidney stones are deposits of minerals and salts that form inside your kidneys. ?You can lower your risk of kidney stones by making changes to your diet. ?The most important thing you can do is drink enough fluid. Drink enough fluid to keep your urine pale yellow. ?Talk to your dietitian about how much calcium you should have each day, and eat less salt and animal protein as told by your dietitian. ?This information is not intended to replace advice given to you by your health care provider. Make sure you discuss any questions you have with your health care provider. ?Document Revised: 05/07/2021 Document Reviewed: 05/07/2021 ?Elsevier Patient Education ? 2023 [employer]vier Inc. ? ?

## 2022-01-14 ENCOUNTER — Other Ambulatory Visit: Payer: Self-pay

## 2022-01-14 DIAGNOSIS — K9589 Other complications of other bariatric procedure: Secondary | ICD-10-CM

## 2022-01-15 ENCOUNTER — Inpatient Hospital Stay (HOSPITAL_BASED_OUTPATIENT_CLINIC_OR_DEPARTMENT_OTHER): Payer: 59 | Admitting: Nurse Practitioner

## 2022-01-15 ENCOUNTER — Inpatient Hospital Stay: Payer: 59 | Attending: Nurse Practitioner

## 2022-01-15 ENCOUNTER — Inpatient Hospital Stay: Payer: 59

## 2022-01-15 ENCOUNTER — Encounter: Payer: Self-pay | Admitting: Nurse Practitioner

## 2022-01-15 ENCOUNTER — Other Ambulatory Visit: Payer: Self-pay | Admitting: *Deleted

## 2022-01-15 VITALS — BP 172/94 | HR 83 | Temp 98.7°F | Resp 20 | Wt 133.4 lb

## 2022-01-15 DIAGNOSIS — D508 Other iron deficiency anemias: Secondary | ICD-10-CM

## 2022-01-15 DIAGNOSIS — E611 Iron deficiency: Secondary | ICD-10-CM

## 2022-01-15 DIAGNOSIS — K9589 Other complications of other bariatric procedure: Secondary | ICD-10-CM

## 2022-01-15 DIAGNOSIS — E538 Deficiency of other specified B group vitamins: Secondary | ICD-10-CM | POA: Diagnosis not present

## 2022-01-15 DIAGNOSIS — Z87891 Personal history of nicotine dependence: Secondary | ICD-10-CM | POA: Diagnosis not present

## 2022-01-15 DIAGNOSIS — D7589 Other specified diseases of blood and blood-forming organs: Secondary | ICD-10-CM

## 2022-01-15 LAB — COMPREHENSIVE METABOLIC PANEL
ALT: 9 U/L (ref 0–44)
AST: 16 U/L (ref 15–41)
Albumin: 3.9 g/dL (ref 3.5–5.0)
Alkaline Phosphatase: 73 U/L (ref 38–126)
Anion gap: 9 (ref 5–15)
BUN: 23 mg/dL — ABNORMAL HIGH (ref 6–20)
CO2: 21 mmol/L — ABNORMAL LOW (ref 22–32)
Calcium: 8.9 mg/dL (ref 8.9–10.3)
Chloride: 107 mmol/L (ref 98–111)
Creatinine, Ser: 0.56 mg/dL (ref 0.44–1.00)
GFR, Estimated: >60 mL/min (ref >60)
Glucose, Bld: 79 mg/dL (ref 70–99)
Potassium: 3.8 mmol/L (ref 3.5–5.1)
Sodium: 137 mmol/L (ref 135–145)
Total Bilirubin: 0.4 mg/dL (ref 0.3–1.2)
Total Protein: 7.7 g/dL (ref 6.5–8.1)

## 2022-01-15 LAB — CBC WITH DIFFERENTIAL/PLATELET
Abs Immature Granulocytes: 0.05 10*3/uL (ref 0.00–0.07)
Basophils Absolute: 0 10*3/uL (ref 0.0–0.1)
Basophils Relative: 1 %
Eosinophils Absolute: 0.1 10*3/uL (ref 0.0–0.5)
Eosinophils Relative: 1 %
HCT: 46.4 % — ABNORMAL HIGH (ref 36.0–46.0)
Hemoglobin: 15.6 g/dL — ABNORMAL HIGH (ref 12.0–15.0)
Immature Granulocytes: 1 %
Lymphocytes Relative: 21 %
Lymphs Abs: 1.4 10*3/uL (ref 0.7–4.0)
MCH: 36.8 pg — ABNORMAL HIGH (ref 26.0–34.0)
MCHC: 33.6 g/dL (ref 30.0–36.0)
MCV: 109.4 fL — ABNORMAL HIGH (ref 80.0–100.0)
Monocytes Absolute: 0.5 10*3/uL (ref 0.1–1.0)
Monocytes Relative: 8 %
Neutro Abs: 4.5 10*3/uL (ref 1.7–7.7)
Neutrophils Relative %: 68 %
Platelets: 267 10*3/uL (ref 150–400)
RBC: 4.24 MIL/uL (ref 3.87–5.11)
RDW: 13.5 % (ref 11.5–15.5)
WBC: 6.4 10*3/uL (ref 4.0–10.5)
nRBC: 0 % (ref 0.0–0.2)

## 2022-01-15 LAB — IRON AND TIBC
Iron: 156 ug/dL (ref 28–170)
Saturation Ratios: 44 % — ABNORMAL HIGH (ref 10.4–31.8)
TIBC: 353 ug/dL (ref 250–450)
UIBC: 197 ug/dL

## 2022-01-15 LAB — VITAMIN B12: Vitamin B-12: 330 pg/mL (ref 180–914)

## 2022-01-15 LAB — FOLATE: Folate: 5.2 ng/mL — ABNORMAL LOW (ref >5.9)

## 2022-01-15 LAB — FERRITIN: Ferritin: 29 ng/mL (ref 11–307)

## 2022-01-15 MED ORDER — CYANOCOBALAMIN 1000 MCG/ML IJ SOLN
1000.0000 ug | Freq: Once | INTRAMUSCULAR | Status: AC
  Administered 2022-01-15: 1000 ug via INTRAMUSCULAR
  Filled 2022-01-15: qty 1

## 2022-01-15 NOTE — Addendum Note (Signed)
Addended by: Suzan Slick on: 01/15/2022 02:13 PM ? ? Modules accepted: Orders ? ?

## 2022-01-15 NOTE — Progress Notes (Signed)
[facility] ?CONSULT NOTE ? ?Patient Care Team: ?Excell Seltzer, MD as PCP - General ? ?CHIEF COMPLAINTS/PURPOSE OF CONSULTATION: Iron deficiency s/p gastric bypass ? ?#  Iron deficiency s/p gastric bypass/ B12 deficiency-  ?  ?#  Macrocytosis- mild NO  alcohol/liver disease.  ?  ?#Secondary erythrocytosis  cigarette smoking and sleep apnea. [ elevated EPO, JAK2, V617F, exons 12-15, CALR, and MPL were negative. Carbon monoxide 11.8% (elevated).].  ? ? ?HISTORY OF PRESENTING ILLNESS: Ambulating independently.  Accompanied by her daughter. ?Brenda Hawkins 56 y.o.  female patient with a history of gastric bypass; iron deficiency is here for follow-up. Last received IV iron in August 2022. Has been receiving monthly b12.  ? ? ? ? ?Review of Systems  ?Constitutional:  Negative for chills, diaphoresis, fever, malaise/fatigue and weight loss.  ?HENT:  Negative for nosebleeds and sore throat.   ?Eyes:  Negative for double vision.  ?Respiratory:  Negative for cough, hemoptysis, sputum production, shortness of breath and wheezing.   ?Cardiovascular:  Negative for chest pain, palpitations, orthopnea and leg swelling.  ?Gastrointestinal:  Negative for abdominal pain, blood in stool, constipation, diarrhea, heartburn, melena, nausea and vomiting.  ?Genitourinary:  Negative for dysuria, frequency and urgency.  ?Musculoskeletal:  Negative for back pain and joint pain.  ?Skin: Negative.  Negative for itching and rash.  ?Neurological:  Negative for dizziness, tingling, focal weakness, weakness and headaches.  ?Endo/Heme/Allergies:  Does not bruise/bleed easily.  ?Psychiatric/Behavioral:  Negative for depression. The patient is not nervous/anxious and does not have insomnia.    ? ?MEDICAL HISTORY:  ?Past Medical History:  ?Diagnosis Date  ? Anemia   ? in past  ? Anxiety   ? no longer  ? Asthma   ? seasonal  ? Diabetes mellitus type II   ? no longer  ? GERD (gastroesophageal reflux disease)   ? no longer  ? Kidney  stone   ? Obesity   ? Osteopenia   ? Sleep apnea   ? no longer  ? Symptomatic cholelithiasis 02/05/2019  ? Urachal cyst 05/2000  ? Vitamin B12 deficiency   ? since bariatric surgery  ? ? ?SURGICAL HISTORY: ?Past Surgical History:  ?Procedure Laterality Date  ? CHOLECYSTECTOMY N/A 02/17/2019  ? Procedure: LAPAROSCOPIC CHOLECYSTECTOMY - DIABETIC;  Surgeon: Ancil Linsey, MD;  Location: [facility];  Service: General;  Laterality: N/A;  ? CYSTOSCOPY W/ URETERAL STENT PLACEMENT Left 11/11/2018  ? Procedure: CYSTOSCOPY WITH STENT REMOVAL;  Surgeon: Riki Altes, MD;  Location: [facility];  Service: Urology;  Laterality: Left;  ? CYSTOSCOPY WITH HOLMIUM LASER LITHOTRIPSY Left 10/24/2018  ? Procedure: CYSTOSCOPY WITH HOLMIUM LASER LITHOTRIPSy;  Surgeon: Crista Elliot, MD;  Location: [facility];  Service: Urology;  Laterality: Left;  ? CYSTOSCOPY WITH STENT PLACEMENT Bilateral 10/24/2018  ? Procedure: CYSTOSCOPY WITH STENT PLACEMENT;  Surgeon: Crista Elliot, MD;  Location: [facility];  Service: Urology;  Laterality: Bilateral;  ? CYSTOSCOPY/URETEROSCOPY/HOLMIUM LASER/STENT PLACEMENT Right 11/11/2018  ? Procedure: CYSTOSCOPY/URETEROSCOPY/HOLMIUM LASER/STENT EXCHANGE;  Surgeon: Riki Altes, MD;  Location: [facility];  Service: Urology;  Laterality: Right;  ? ESOPHAGOGASTRODUODENOSCOPY (EGD) WITH PROPOFOL N/A 06/01/2018  ? Procedure: ESOPHAGOGASTRODUODENOSCOPY (EGD) WITH PROPOFOL;  Surgeon: Toney Reil, MD;  Location: [facility];  Service: Gastroenterology;  Laterality: N/A;  ? GALLBLADDER SURGERY  02/17/2019  ? GASTRIC BYPASS  03/17/02  ? REDUCTION MAMMAPLASTY Right 1992  ? URETEROSCOPY Left 10/24/2018  ? Procedure: URETEROSCOPY;  Surgeon: Crista Elliot, MD;  Location: [facility];  Service: Urology;  Laterality: Left;  ? ? ?SOCIAL HISTORY: ?Social History  ? ?Socioeconomic History  ? Marital status: Married  ?  Spouse name: Not on file  ? Number of children: 1  ? Years of education: Not on file  ? Highest  education level: Not on file  ?Occupational History  ? Occupation: homemaker  ?Tobacco Use  ? Smoking status: Every Day  ?  Packs/day: 1.50  ?  Years: 37.00  ?  Pack years: 55.50  ?  Types: Cigarettes  ? Smokeless tobacco: Never  ?Vaping Use  ? Vaping Use: Never used  ?Substance and Sexual Activity  ? Alcohol use: No  ? Drug use: No  ? Sexual activity: Yes  ?  Birth control/protection: None, Post-menopausal  ?Other Topics Concern  ? Not on file  ?Social History Narrative  ? Sporadic with exercise  ? ?Social Determinants of Health  ? ?Financial Resource Strain: Not on file  ?Food Insecurity: Not on file  ?Transportation Needs: Not on file  ?Physical Activity: Not on file  ?Stress: Not on file  ?Social Connections: Not on file  ?Intimate Partner Violence: Not on file  ? ? ?FAMILY HISTORY: ?Family History  ?Problem Relation Age of Onset  ? Heart disease Mother   ?     valve replacement surgery  ? Coronary artery disease Father   ? Breast cancer Neg Hx   ? ? ?ALLERGIES:  is allergic to codeine sulfate, penicillins, adhesive [tape], and latex. ? ?MEDICATIONS:  ?Current Outpatient Medications  ?Medication Sig Dispense Refill  ? albuterol (VENTOLIN HFA) 108 (90 Base) MCG/ACT inhaler Inhale 2 puffs into the lungs every 6 (six) hours as needed for wheezing or shortness of breath. 6.7 g 0  ? atorvastatin (LIPITOR) 10 MG tablet Take 1 tablet (10 mg total) by mouth daily. 30 tablet 2  ? esomeprazole (NEXIUM) 40 MG capsule Take 1 capsule (40 mg total) by mouth 2 (two) times daily before a meal. 60 capsule 2  ? ondansetron (ZOFRAN-ODT) 4 MG disintegrating tablet Take 1 tablet (4 mg total) by mouth every 8 (eight) hours as needed for nausea or vomiting. (Patient not taking: Reported on 12/26/2021) 20 tablet 0  ? tamsulosin (FLOMAX) 0.4 MG CAPS capsule Take 1 capsule (0.4 mg total) by mouth daily. 14 capsule 0  ? ?No current facility-administered medications for this visit.  ? ?Facility-Administered Medications Ordered in Other  Visits  ?Medication Dose Route Frequency Provider Last Rate Last Admin  ? sodium chloride 0.9 % injection 10 mL  10 mL Intracatheter PRN Orlie DakinFinnegan, Tollie Pizzaimothy J, MD      ? ? ?  ?. ? ?PHYSICAL EXAMINATION: ? ?There were no vitals filed for this visit. ? ?There were no vitals filed for this visit. ? ?Physical Exam ?Vitals reviewed.  ?Constitutional:   ?   Appearance: She is not ill-appearing.  ?Eyes:  ?   General: No scleral icterus. ?   Conjunctiva/sclera: Conjunctivae normal.  ?Cardiovascular:  ?   Rate and Rhythm: Normal rate and regular rhythm.  ?Abdominal:  ?   General: There is no distension.  ?   Palpations: Abdomen is soft.  ?   Tenderness: There is no abdominal tenderness. There is no guarding.  ?Musculoskeletal:     ?   General: No deformity.  ?   Right lower leg: No edema.  ?   Left lower leg: No edema.  ?Lymphadenopathy:  ?   Cervical: No cervical adenopathy.  ?Skin: ?  General: Skin is warm and dry.  ?Neurological:  ?   Mental Status: She is alert and oriented to person, place, and time. Mental status is at baseline.  ?Psychiatric:     ?   Mood and Affect: Mood normal.     ?   Behavior: Behavior normal.  ? ? ? ?LABORATORY DATA:  ?I have reviewed the data as listed ?Lab Results  ?Component Value Date  ? WBC 9.3 12/10/2021  ? HGB 17.2 (H) 12/10/2021  ? HCT 49.7 (H) 12/10/2021  ? MCV 108.6 (H) 12/10/2021  ? PLT 257.0 12/10/2021  ? ?Recent Labs  ?  02/09/21 ?1497 12/10/21 ?1127  ?NA 141 137  ?K 4.3 3.4*  ?CL 108 99  ?CO2 20 25  ?GLUCOSE 68* 122*  ?BUN 24* 19  ?CREATININE 0.65 0.63  ?CALCIUM 9.7 10.1  ?PROT 7.2 7.5  ?ALBUMIN 4.0 4.2  ?AST 23 15  ?ALT 9 8  ?ALKPHOS 57 80  ?BILITOT 0.1* 0.4  ? ?Iron/TIBC/Ferritin/ %Sat ?   ?Component Value Date/Time  ? IRON 139 07/12/2021 1126  ? IRON 14 (L) 01/02/2015 1507  ? TIBC 322 07/12/2021 1126  ? TIBC 447 01/02/2015 1507  ? FERRITIN 56 07/12/2021 1126  ? FERRITIN 2 (L) 01/02/2015 1507  ? IRONPCTSAT 43 (H) 07/12/2021 1126  ? IRONPCTSAT 3.1 01/02/2015 1507  ? ?Lab Results   ?Component Value Date  ? VITAMINB12 995 (H) 07/12/2021  ? ? ? ?RADIOGRAPHIC STUDIES: ?I have personally reviewed the radiological images as listed and agreed with the findings in the report. ?No results found. ? ?

## 2022-01-31 ENCOUNTER — Telehealth: Payer: Self-pay

## 2022-01-31 NOTE — Telephone Encounter (Signed)
-----   Message from Alinda Dooms, NP sent at 01/29/2022  4:38 PM EDT ----- Please let patient know that her b12 level is low. She can try oral tablet which is available otc or we can consider monthly injections.  ----- Message ----- From: Interface, Lab In [place] Sent: 01/15/2022   1:20 PM EDT To: Alinda Dooms, NP

## 2022-02-05 ENCOUNTER — Inpatient Hospital Stay: Payer: 59

## 2022-02-05 DIAGNOSIS — E538 Deficiency of other specified B group vitamins: Secondary | ICD-10-CM | POA: Diagnosis not present

## 2022-02-05 DIAGNOSIS — K9589 Other complications of other bariatric procedure: Secondary | ICD-10-CM

## 2022-02-05 MED ORDER — CYANOCOBALAMIN 1000 MCG/ML IJ SOLN
1000.0000 ug | Freq: Once | INTRAMUSCULAR | Status: AC
  Administered 2022-02-05: 1000 ug via INTRAMUSCULAR
  Filled 2022-02-05: qty 1

## 2022-02-15 ENCOUNTER — Inpatient Hospital Stay: Payer: 59

## 2022-02-19 ENCOUNTER — Inpatient Hospital Stay: Payer: 59 | Attending: Nurse Practitioner

## 2022-02-19 DIAGNOSIS — K9589 Other complications of other bariatric procedure: Secondary | ICD-10-CM

## 2022-02-19 DIAGNOSIS — E538 Deficiency of other specified B group vitamins: Secondary | ICD-10-CM | POA: Insufficient documentation

## 2022-02-19 MED ORDER — CYANOCOBALAMIN 1000 MCG/ML IJ SOLN
1000.0000 ug | Freq: Once | INTRAMUSCULAR | Status: AC
  Administered 2022-02-19: 1000 ug via INTRAMUSCULAR
  Filled 2022-02-19: qty 1

## 2022-03-05 ENCOUNTER — Telehealth: Payer: Self-pay | Admitting: Acute Care

## 2022-03-05 ENCOUNTER — Inpatient Hospital Stay: Payer: 59

## 2022-03-05 DIAGNOSIS — E538 Deficiency of other specified B group vitamins: Secondary | ICD-10-CM | POA: Diagnosis not present

## 2022-03-05 DIAGNOSIS — K9589 Other complications of other bariatric procedure: Secondary | ICD-10-CM

## 2022-03-05 MED ORDER — CYANOCOBALAMIN 1000 MCG/ML IJ SOLN
1000.0000 ug | Freq: Once | INTRAMUSCULAR | Status: AC
  Administered 2022-03-05: 1000 ug via INTRAMUSCULAR
  Filled 2022-03-05: qty 1

## 2022-03-05 NOTE — Telephone Encounter (Signed)
Left voicemail with call back number for patient to call to schedule annual LDCT  

## 2022-03-18 ENCOUNTER — Inpatient Hospital Stay: Payer: 59

## 2022-03-19 ENCOUNTER — Inpatient Hospital Stay: Payer: 59 | Attending: Nurse Practitioner

## 2022-03-19 DIAGNOSIS — D508 Other iron deficiency anemias: Secondary | ICD-10-CM

## 2022-03-19 DIAGNOSIS — E538 Deficiency of other specified B group vitamins: Secondary | ICD-10-CM | POA: Insufficient documentation

## 2022-03-19 MED ORDER — CYANOCOBALAMIN 1000 MCG/ML IJ SOLN
1000.0000 ug | Freq: Once | INTRAMUSCULAR | Status: AC
  Administered 2022-03-19: 1000 ug via INTRAMUSCULAR
  Filled 2022-03-19: qty 1

## 2022-04-12 ENCOUNTER — Ambulatory Visit: Payer: 59 | Admitting: Family Medicine

## 2022-04-12 ENCOUNTER — Encounter: Payer: Self-pay | Admitting: Family Medicine

## 2022-04-12 VITALS — BP 150/84 | HR 75 | Temp 98.0°F | Ht 60.0 in | Wt 134.0 lb

## 2022-04-12 DIAGNOSIS — I152 Hypertension secondary to endocrine disorders: Secondary | ICD-10-CM

## 2022-04-12 DIAGNOSIS — E1169 Type 2 diabetes mellitus with other specified complication: Secondary | ICD-10-CM | POA: Diagnosis not present

## 2022-04-12 DIAGNOSIS — E785 Hyperlipidemia, unspecified: Secondary | ICD-10-CM

## 2022-04-12 DIAGNOSIS — I700 Atherosclerosis of aorta: Secondary | ICD-10-CM

## 2022-04-12 DIAGNOSIS — Z716 Tobacco abuse counseling: Secondary | ICD-10-CM

## 2022-04-12 DIAGNOSIS — D508 Other iron deficiency anemias: Secondary | ICD-10-CM

## 2022-04-12 DIAGNOSIS — K2100 Gastro-esophageal reflux disease with esophagitis, without bleeding: Secondary | ICD-10-CM

## 2022-04-12 DIAGNOSIS — R030 Elevated blood-pressure reading, without diagnosis of hypertension: Secondary | ICD-10-CM

## 2022-04-12 DIAGNOSIS — E119 Type 2 diabetes mellitus without complications: Secondary | ICD-10-CM | POA: Diagnosis not present

## 2022-04-12 DIAGNOSIS — K253 Acute gastric ulcer without hemorrhage or perforation: Secondary | ICD-10-CM | POA: Diagnosis not present

## 2022-04-12 DIAGNOSIS — K9589 Other complications of other bariatric procedure: Secondary | ICD-10-CM

## 2022-04-12 DIAGNOSIS — R0989 Other specified symptoms and signs involving the circulatory and respiratory systems: Secondary | ICD-10-CM

## 2022-04-12 DIAGNOSIS — J4520 Mild intermittent asthma, uncomplicated: Secondary | ICD-10-CM

## 2022-04-12 DIAGNOSIS — Z720 Tobacco use: Secondary | ICD-10-CM

## 2022-04-12 DIAGNOSIS — E1159 Type 2 diabetes mellitus with other circulatory complications: Secondary | ICD-10-CM

## 2022-04-12 LAB — POCT GLYCOSYLATED HEMOGLOBIN (HGB A1C): Hemoglobin A1C: 5.8 % — AB (ref 4.0–5.6)

## 2022-04-12 LAB — HM DIABETES FOOT EXAM

## 2022-04-12 MED ORDER — ATORVASTATIN CALCIUM 10 MG PO TABS
10.0000 mg | ORAL_TABLET | Freq: Every day | ORAL | 11 refills | Status: DC
Start: 2022-04-12 — End: 2023-10-22

## 2022-04-12 MED ORDER — ALBUTEROL SULFATE HFA 108 (90 BASE) MCG/ACT IN AERS: 2.0000 | INHALATION_SPRAY | Freq: Four times a day (QID) | RESPIRATORY_TRACT | 0 refills | Status: AC | PRN

## 2022-04-12 MED ORDER — ESOMEPRAZOLE MAGNESIUM 40 MG PO CPDR: 40.0000 mg | DELAYED_RELEASE_CAPSULE | Freq: Two times a day (BID) | ORAL | 11 refills | Status: AC

## 2022-04-12 NOTE — Assessment & Plan Note (Signed)
Counseled on the importance of tobacco cessation especially in light of elevated blood pressure and possible peripheral artery disease.  She is precontemplative at this time.  She is not interested in medication treatment to help with prevention

## 2022-04-12 NOTE — Assessment & Plan Note (Signed)
Chronic, at last check in April LDL almost at goal less than 70 given aortic atherosclerosis.  Continue  atorvastatin 10 mg p.o. daily.

## 2022-04-12 NOTE — Assessment & Plan Note (Signed)
Followed by oncology.  Labs to be scheduled in November per patient.

## 2022-04-12 NOTE — Progress Notes (Signed)
Patient ID: Brenda Hawkins, female    DOB: [birth year], 56 y.o.   MRN: [id number]  This visit was conducted in person.  BP (!) 150/84   Pulse 75   Temp 98 F (36.7 C) (Oral)   Ht 5' (1.524 m)   Wt 134 lb (60.8 kg)   SpO2 97%   BMI 26.17 kg/m    CC:  Chief Complaint  Patient presents with   Diabetes    Subjective:   HPI: Brenda Hawkins is a 56 y.o. female presenting on 04/12/2022 for Diabetes She has not been seen in over a year.  Hx of gastric bypass.  Diabetes: Significant improvement in control with diet. Lab Results  Component Value Date   HGBA1C 5.8 (A) 04/12/2022  Using medications without difficulties: Hypoglycemic episodes: Hyperglycemic episodes: Feet problems: no ulcers Blood Sugars averaging: not checking eye exam within last year: yes  Wt Readings from Last 3 Encounters:  04/12/22 134 lb (60.8 kg)  01/15/22 133 lb 6.4 oz (60.5 kg)  12/26/21 129 lb (58.5 kg)   Body mass index is 26.17 kg/m.  Elevated Cholesterol: LDL ALMOST at goal less than 70 with history of aortic atherosclerosis.  On atorvastatin 10 mg daily.  Refill requested. Lab Results  Component Value Date   CHOL 145 12/10/2021   HDL 26.40 (L) 12/10/2021   LDLCALC 111 (H) 12/09/2014   LDLDIRECT 87.0 12/10/2021   TRIG 255.0 (H) 12/10/2021   CHOLHDL 5 12/10/2021  Using medications without problems: none Muscle aches: none Diet compliance:  good Exercise:  as tolerated Other complaints:  Hypertension:  Blood pressure not at goal.  She is not currently on any medication. BP Readings from Last 3 Encounters:  04/12/22 (!) 150/84  01/15/22 (!) 172/94  12/26/21 (!) 172/111  Using medication without problems or lightheadedness: none  Chest pain with exertion: non Edema: none Short of breath: none Average home BPs: 140-143/80 Other issues:   GERD: Well-controlled on Nexium 40 mg  twice daily.  Refill requested History of chronic erosive gastritis, peptic ulcer disease    Asthma, mild intermittent.. need refill of albuterol inhaler.  Infrequent exacerbations.  Smoker.. cold feet bilaterally.. Pain in sole of foot off and on.  Relevant past medical, surgical, family and social history reviewed and updated as indicated. Interim medical history since our last visit reviewed. Allergies and medications reviewed and updated. Outpatient Medications Prior to Visit  Medication Sig Dispense Refill   albuterol (VENTOLIN HFA) 108 (90 Base) MCG/ACT inhaler Inhale 2 puffs into the lungs every 6 (six) hours as needed for wheezing or shortness of breath. 6.7 g 0   atorvastatin (LIPITOR) 10 MG tablet Take 1 tablet (10 mg total) by mouth daily. 30 tablet 2   esomeprazole (NEXIUM) 40 MG capsule Take 1 capsule (40 mg total) by mouth 2 (two) times daily before a meal. 60 capsule 2   ondansetron (ZOFRAN-ODT) 4 MG disintegrating tablet Take 1 tablet (4 mg total) by mouth every 8 (eight) hours as needed for nausea or vomiting. (Patient not taking: Reported on 12/26/2021) 20 tablet 0   tamsulosin (FLOMAX) 0.4 MG CAPS capsule Take 1 capsule (0.4 mg total) by mouth daily. (Patient not taking: Reported on 01/15/2022) 14 capsule 0   Facility-Administered Medications Prior to Visit  Medication Dose Route Frequency Provider Last Rate Last Admin   sodium chloride 0.9 % injection 10 mL  10 mL Intracatheter PRN Jeralyn Ruths, MD         Per  HPI unless specifically indicated in ROS section below Review of Systems Objective:  BP (!) 150/84   Pulse 75   Temp 98 F (36.7 C) (Oral)   Ht 5' (1.524 m)   Wt 134 lb (60.8 kg)   SpO2 97%   BMI 26.17 kg/m   Wt Readings from Last 3 Encounters:  04/12/22 134 lb (60.8 kg)  01/15/22 133 lb 6.4 oz (60.5 kg)  12/26/21 129 lb (58.5 kg)      Physical Exam    Diabetic foot exam: Normal inspection No skin breakdown No calluses  Decreasedl DP pulses Normal sensation to light touch and monofilament Nails normal  Results for orders placed or  performed in visit on 04/12/22  POCT glycosylated hemoglobin (Hb A1C)  Result Value Ref Range   Hemoglobin A1C 5.8 (A) 4.0 - 5.6 %   HbA1c POC (<> result, manual entry)     HbA1c, POC (prediabetic range)     HbA1c, POC (controlled diabetic range)       COVID 19 screen:  No recent travel or known exposure to COVID19 The patient denies respiratory symptoms of COVID 19 at this time. The importance of social distancing was discussed today.   Assessment and Plan    Problem List Items Addressed This Visit     Aortic atherosclerosis (HCC)    LDL goal less than 70.  Currently on statin medication.        Relevant Medications   atorvastatin (LIPITOR) 10 MG tablet   Other Relevant Orders   VAS [place] LOWER EXT ART SEG MULTI (SEGMENTALS & LE RAYNAUDS)   Controlled type 2 diabetes mellitus without complication, without long-term current use of insulin (HCC) - Primary    Chronic, excellent improvement with lifestyle changes including low-carb diet.      Relevant Medications   atorvastatin (LIPITOR) 10 MG tablet   Other Relevant Orders   POCT glycosylated hemoglobin (Hb A1C) (Completed)   Lipid panel   Hemoglobin A1c   Comprehensive metabolic panel   Microalbumin / creatinine urine ratio   VAS [place] LOWER EXT ART SEG MULTI (SEGMENTALS & LE RAYNAUDS)   Decreased pulse    Concerning for possible peripheral artery disease and patient with coronary artery disease, aortic atherosclerosis and tobacco abuse.  Will refer for ABIs bilaterally.      Relevant Orders   VAS [place] LOWER EXT ART SEG MULTI (SEGMENTALS & LE RAYNAUDS)   Elevated blood pressure reading in office without diagnosis of hypertension    She states her blood pressure is better controlled at home.  We discussed in detail that her goal is less than 140/90.  I encouraged her to quit smoking, make lifestyle changes.  We will reevaluate at her next office visit in 3 months.  If blood pressure still elevated would recommend starting  low-dose losartan.      GERD    Chronic, well controlled on PPI twice daily.      Relevant Medications   esomeprazole (NEXIUM) 40 MG capsule   Hyperlipidemia associated with type 2 diabetes mellitus (HCC)    Chronic, at last check in April LDL almost at goal less than 70 given aortic atherosclerosis.  Continue  atorvastatin 10 mg p.o. daily.      Relevant Medications   atorvastatin (LIPITOR) 10 MG tablet   Hypertension associated with diabetes (HCC)   Relevant Medications   atorvastatin (LIPITOR) 10 MG tablet   Iron deficiency anemia following bariatric surgery    Followed by oncology.  Labs  to be scheduled in November per patient.      Mild intermittent asthma    In smoker.  Recommended tobacco cessation.  Refilled albuterol to use as needed.      Relevant Medications   albuterol (VENTOLIN HFA) 108 (90 Base) MCG/ACT inhaler   Tobacco abuse disorder    Counseled on the importance of tobacco cessation especially in light of elevated blood pressure and possible peripheral artery disease.  She is precontemplative at this time.  She is not interested in medication treatment to help with prevention      Other Visit Diagnoses     Acute gastric ulcer without hemorrhage or perforation       Relevant Medications   atorvastatin (LIPITOR) 10 MG tablet   esomeprazole (NEXIUM) 40 MG capsule   Tobacco abuse counseling       Relevant Orders   VAS [place] LOWER EXT ART SEG MULTI (SEGMENTALS & LE RAYNAUDS)        Kerby Nora, MD

## 2022-04-12 NOTE — Assessment & Plan Note (Signed)
Chronic, well controlled on PPI twice daily.

## 2022-04-12 NOTE — Assessment & Plan Note (Signed)
Concerning for possible peripheral artery disease and patient with coronary artery disease, aortic atherosclerosis and tobacco abuse.  Will refer for ABIs bilaterally.

## 2022-04-12 NOTE — Patient Instructions (Addendum)
Quit smoking!  Follow blood pressure at home goal < 140/90.  Work on healthy eating, low salt and regular exercise. I will work on setting up a test to evaluate the blood flow to your feet.

## 2022-04-12 NOTE — Assessment & Plan Note (Signed)
LDL goal less than 70.  Currently on statin medication. 

## 2022-04-12 NOTE — Assessment & Plan Note (Signed)
Chronic, excellent improvement with lifestyle changes including low-carb diet.

## 2022-04-12 NOTE — Assessment & Plan Note (Signed)
She states her blood pressure is better controlled at home.  We discussed in detail that her goal is less than 140/90.  I encouraged her to quit smoking, make lifestyle changes.  We will reevaluate at her next office visit in 3 months.  If blood pressure still elevated would recommend starting low-dose losartan.

## 2022-04-12 NOTE — Assessment & Plan Note (Signed)
In smoker.  Recommended tobacco cessation.  Refilled albuterol to use as needed.

## 2022-04-18 ENCOUNTER — Inpatient Hospital Stay: Payer: 59 | Attending: Nurse Practitioner

## 2022-04-18 DIAGNOSIS — E538 Deficiency of other specified B group vitamins: Secondary | ICD-10-CM | POA: Insufficient documentation

## 2022-04-18 DIAGNOSIS — D508 Other iron deficiency anemias: Secondary | ICD-10-CM

## 2022-04-18 MED ORDER — CYANOCOBALAMIN 1000 MCG/ML IJ SOLN
1000.0000 ug | Freq: Once | INTRAMUSCULAR | Status: AC
  Administered 2022-04-18: 1000 ug via INTRAMUSCULAR
  Filled 2022-04-18: qty 1

## 2022-05-20 ENCOUNTER — Inpatient Hospital Stay: Payer: 59 | Attending: Nurse Practitioner

## 2022-05-20 DIAGNOSIS — E538 Deficiency of other specified B group vitamins: Secondary | ICD-10-CM | POA: Diagnosis present

## 2022-05-20 DIAGNOSIS — K9589 Other complications of other bariatric procedure: Secondary | ICD-10-CM

## 2022-05-20 MED ORDER — CYANOCOBALAMIN 1000 MCG/ML IJ SOLN
1000.0000 ug | Freq: Once | INTRAMUSCULAR | Status: AC
  Administered 2022-05-20: 1000 ug via INTRAMUSCULAR
  Filled 2022-05-20: qty 1

## 2022-06-19 ENCOUNTER — Inpatient Hospital Stay: Payer: 59

## 2022-06-20 ENCOUNTER — Inpatient Hospital Stay: Payer: 59 | Attending: Nurse Practitioner

## 2022-06-20 DIAGNOSIS — D508 Other iron deficiency anemias: Secondary | ICD-10-CM

## 2022-06-20 DIAGNOSIS — E538 Deficiency of other specified B group vitamins: Secondary | ICD-10-CM | POA: Diagnosis present

## 2022-06-20 MED ORDER — CYANOCOBALAMIN 1000 MCG/ML IJ SOLN
1000.0000 ug | Freq: Once | INTRAMUSCULAR | Status: AC
  Administered 2022-06-20: 1000 ug via INTRAMUSCULAR
  Filled 2022-06-20: qty 1

## 2022-07-09 ENCOUNTER — Other Ambulatory Visit: Payer: 59

## 2022-07-16 ENCOUNTER — Encounter: Payer: Self-pay | Admitting: Family Medicine

## 2022-07-16 ENCOUNTER — Ambulatory Visit (INDEPENDENT_AMBULATORY_CARE_PROVIDER_SITE_OTHER): Payer: 59 | Admitting: Family Medicine

## 2022-07-16 VITALS — BP 170/88 | HR 84 | Temp 98.3°F | Ht 59.75 in | Wt 131.2 lb

## 2022-07-16 DIAGNOSIS — Z6830 Body mass index (BMI) 30.0-30.9, adult: Secondary | ICD-10-CM

## 2022-07-16 DIAGNOSIS — D508 Other iron deficiency anemias: Secondary | ICD-10-CM

## 2022-07-16 DIAGNOSIS — E119 Type 2 diabetes mellitus without complications: Secondary | ICD-10-CM

## 2022-07-16 DIAGNOSIS — Z720 Tobacco use: Secondary | ICD-10-CM

## 2022-07-16 DIAGNOSIS — I700 Atherosclerosis of aorta: Secondary | ICD-10-CM

## 2022-07-16 DIAGNOSIS — M5442 Lumbago with sciatica, left side: Secondary | ICD-10-CM

## 2022-07-16 DIAGNOSIS — K2100 Gastro-esophageal reflux disease with esophagitis, without bleeding: Secondary | ICD-10-CM

## 2022-07-16 DIAGNOSIS — E6609 Other obesity due to excess calories: Secondary | ICD-10-CM

## 2022-07-16 DIAGNOSIS — R0989 Other specified symptoms and signs involving the circulatory and respiratory systems: Secondary | ICD-10-CM

## 2022-07-16 DIAGNOSIS — J4520 Mild intermittent asthma, uncomplicated: Secondary | ICD-10-CM

## 2022-07-16 DIAGNOSIS — E1159 Type 2 diabetes mellitus with other circulatory complications: Secondary | ICD-10-CM

## 2022-07-16 DIAGNOSIS — E785 Hyperlipidemia, unspecified: Secondary | ICD-10-CM

## 2022-07-16 DIAGNOSIS — E1169 Type 2 diabetes mellitus with other specified complication: Secondary | ICD-10-CM

## 2022-07-16 DIAGNOSIS — K9589 Other complications of other bariatric procedure: Secondary | ICD-10-CM

## 2022-07-16 DIAGNOSIS — I152 Hypertension secondary to endocrine disorders: Secondary | ICD-10-CM

## 2022-07-16 MED ORDER — LOSARTAN POTASSIUM 50 MG PO TABS: 50.0000 mg | ORAL_TABLET | Freq: Every day | ORAL | 11 refills | Status: DC

## 2022-07-16 MED ORDER — PREDNISONE 20 MG PO TABS: ORAL_TABLET | ORAL | 0 refills | Status: DC

## 2022-07-16 NOTE — Assessment & Plan Note (Signed)
Will repeat trial of prednisone  taper.  Start heat and home physical.

## 2022-07-16 NOTE — Progress Notes (Signed)
Patient ID: Brenda Hawkins, female    DOB: May 30, 1966, 56 y.o.   MRN: [id number]  This visit was conducted in person.  BP (!) 170/88   Pulse 84   Temp 98.3 F (36.8 C) (Oral)   Ht 4' 11.75" (1.518 m)   Wt 131 lb 4 oz (59.5 kg)   SpO2 96%   BMI 25.85 kg/m    CC:  Chief Complaint  Patient presents with   Annual Exam    Subjective:   HPI: Brenda Hawkins is a 56 y.o. female presenting on 07/16/2022 for Annual Exam   2-3 weeks ago  started with flare of left leg pain and left low back pain.. similar sto sciatica she has had in past.  Has not treated with anything.   Plans re-eval labs.. had at cancer center.  Diabetes:  Due for re-eval. Using medications without difficulties: Hypoglycemic episodes: none Hyperglycemic episodes: none Feet problems: none Blood Sugars averaging:  not checking  eye exam within last year:  Hypertension:   White coat HTN but still remaining high at home. BP Readings from Last 3 Encounters:  07/16/22 (!) 170/88  04/12/22 (!) 150/84  01/15/22 (!) 172/94  Using medication without problems or lightheadedness:  occ Chest pain with exertion:none Edema:none Short of breath: none Average home BPs: 154/80 Other issues:  Elevated Cholesterol: Due for  re-evaluation Using medications without problems: Muscle aches:  Diet compliance: health healthy diet. Exercise: Other complaints:  Iron deficiency anemia and polycythemia followed by Dr. Patsy Baltimore, last OV note reviewed, next OV next week. Scheduled for monthly B12 injections. Has upcoming cbc and B12 check tommorow.   GERD, S/P gastric bypass, hx/o PUD: Stable on nexium, has tried to stop but cannot without return of symptoms. NSAIDs contraindicated. Poor absorption.. needs B12 and vit d to be followed.    Asthma, mild intermittent: stable control  Using albuterol prn. No recent flares  Current smoker, precontemplative to quit smoking.          Relevant past medical, surgical,  family and social history reviewed and updated as indicated. Interim medical history since our last visit reviewed. Allergies and medications reviewed and updated. Outpatient Medications Prior to Visit  Medication Sig Dispense Refill   albuterol (VENTOLIN HFA) 108 (90 Base) MCG/ACT inhaler Inhale 2 puffs into the lungs every 6 (six) hours as needed for wheezing or shortness of breath. 6.7 g 0   atorvastatin (LIPITOR) 10 MG tablet Take 1 tablet (10 mg total) by mouth daily. 30 tablet 11   esomeprazole (NEXIUM) 40 MG capsule Take 1 capsule (40 mg total) by mouth 2 (two) times daily before a meal. 60 capsule 11   Facility-Administered Medications Prior to Visit  Medication Dose Route Frequency Provider Last Rate Last Admin   sodium chloride 0.9 % injection 10 mL  10 mL Intracatheter PRN Brenda Huger, MD         Per HPI unless specifically indicated in ROS section below Review of Systems  Constitutional:  Negative for fatigue and fever.  HENT:  Negative for congestion and ear pain.   Eyes:  Negative for pain.  Respiratory:  Negative for cough, chest tightness and shortness of breath.   Cardiovascular:  Negative for chest pain, palpitations and leg swelling.  Gastrointestinal:  Negative for abdominal pain.  Genitourinary:  Negative for dysuria and vaginal bleeding.  Musculoskeletal:  Negative for back pain.  Neurological:  Negative for syncope, light-headedness and headaches.  Psychiatric/Behavioral:  Negative for dysphoric  mood.    Objective:  BP (!) 170/88   Pulse 84   Temp 98.3 F (36.8 C) (Oral)   Ht 4' 11.75" (1.518 m)   Wt 131 lb 4 oz (59.5 kg)   SpO2 96%   BMI 25.85 kg/m   Wt Readings from Last 3 Encounters:  07/16/22 131 lb 4 oz (59.5 kg)  04/12/22 134 lb (60.8 kg)  01/15/22 133 lb 6.4 oz (60.5 kg)      Physical Exam Vitals and nursing note reviewed.  Constitutional:      General: She is not in acute distress.    Appearance: Normal appearance. She is  well-developed. She is not ill-appearing or toxic-appearing.  HENT:     Head: Normocephalic.     Right Ear: Hearing, tympanic membrane, ear canal and external ear normal.     Left Ear: Hearing, tympanic membrane, ear canal and external ear normal.     Nose: Nose normal.  Eyes:     General: Lids are normal. Lids are everted, no foreign bodies appreciated.     Conjunctiva/sclera: Conjunctivae normal.     Pupils: Pupils are equal, round, and reactive to light.  Neck:     Thyroid: No thyroid mass or thyromegaly.     Vascular: No carotid bruit.     Trachea: Trachea normal.  Cardiovascular:     Rate and Rhythm: Normal rate and regular rhythm.     Pulses:          Dorsalis pedis pulses are 1+ on the right side.       Posterior tibial pulses are 1+ on the right side.     Heart sounds: Normal heart sounds, S1 normal and S2 normal. No murmur heard.    No gallop.  Pulmonary:     Effort: Pulmonary effort is normal. No respiratory distress.     Breath sounds: Normal breath sounds. No wheezing, rhonchi or rales.  Abdominal:     General: Bowel sounds are normal. There is no distension or abdominal bruit.     Palpations: Abdomen is soft. There is no fluid wave or mass.     Tenderness: There is no abdominal tenderness. There is no guarding or rebound.     Hernia: No hernia is present.  Musculoskeletal:     Cervical back: Normal range of motion and neck supple.  Lymphadenopathy:     Cervical: No cervical adenopathy.  Skin:    General: Skin is warm and dry.     Findings: No rash.  Neurological:     Mental Status: She is alert.     Cranial Nerves: No cranial nerve deficit.     Sensory: No sensory deficit.  Psychiatric:        Mood and Affect: Mood is not anxious or depressed.        Speech: Speech normal.        Behavior: Behavior normal. Behavior is cooperative.        Judgment: Judgment normal.       Results for orders placed or performed in visit on 04/12/22  POCT glycosylated  hemoglobin (Hb A1C)  Result Value Ref Range   Hemoglobin A1C 5.8 (A) 4.0 - 5.6 %   HbA1c POC (<> result, manual entry)     HbA1c, POC (prediabetic range)     HbA1c, POC (controlled diabetic range)    HM DIABETES FOOT EXAM  Result Value Ref Range   HM Diabetic Foot Exam done      COVID 19 screen:  No recent travel or known exposure to COVID19 The patient denies respiratory symptoms of COVID 19 at this time. The importance of social distancing was discussed today.   Assessment and Plan The patient's preventative maintenance and recommended screening tests for an annual wellness exam were reviewed in full today. Brought up to date unless services declined.  Counselled on the importance of diet, exercise, and its role in overall health and mortality. The patient's FH and SH was reviewed, including their home life, tobacco status, and drug and alcohol status.    Patient refused all below noted preventative recommendations despite need. This was risk and benefits reviewed in detail with patient. She voiced understanding. Vaccines: refused flu, PNA, TDap and COVID vaccine despite need.. discussed in detail. Pap/DVE:  refused Mammo: refused Bone Density:refused Colon: refused  Smoking Status: current, refused discussion of cessation ETOH/ drug use: none/none  Hep C:  Will check with labs.   Per pt she is already set up with lung cancer screening program.   Problem List Items Addressed This Visit     Acute left-sided low back pain with left-sided sciatica - Primary     Will repeat trial of prednisone  taper.  Start heat and home physical.      Relevant Medications   predniSONE (DELTASONE) 20 MG tablet   Other Relevant Orders   Lipid panel   Comprehensive metabolic panel   Hemoglobin A1c   Microalbumin / creatinine urine ratio   Aortic atherosclerosis (HCC)    LDL goal less than 70.  Currently on statin medication.      Relevant Medications   losartan (COZAAR) 50 MG  tablet   Controlled type 2 diabetes mellitus without complication, without long-term current use of insulin (HCC)    Due for lab reevaluation.      Relevant Medications   losartan (COZAAR) 50 MG tablet   Other Relevant Orders   Lipid panel   Comprehensive metabolic panel   Hemoglobin A1c   Microalbumin / creatinine urine ratio   GERD    Stable on nexium, has tried to stop but cannot without return of symptoms. NSAIDs contraindicated. Poor absorption.. needs B12 and vit d to be followed.      Hyperlipidemia associated with type 2 diabetes mellitus (HCC)    Due for reevaluation      Relevant Medications   losartan (COZAAR) 50 MG tablet   Hypertension associated with diabetes (HCC)    Chronic, combination of elevated blood pressure and whitecoat hypertension Start losartan 50 mg daily for blood pressure control.  Follow blood pressure at home with goal less than 140/90.      Relevant Medications   losartan (COZAAR) 50 MG tablet   Iron deficiency anemia following bariatric surgery    Followed by hematology.  Has upcoming office visit next week along with scheduled CBC and B12 lab work.      Mild intermittent asthma    Chronic, stable control  Using albuterol prn. No recent flares      Relevant Medications   predniSONE (DELTASONE) 20 MG tablet   OBESITY    Encouraged exercise, weight loss, healthy eating habits.       Tobacco abuse disorder    Discussed in detail smoking cessation techniques.  patient pre-contemplative.      Other Visit Diagnoses     Decreased pedal pulses       Relevant Orders   VAS [place] ABI WITH/WO TBI   Lipid panel   Comprehensive metabolic panel  Hemoglobin A1c   Microalbumin / creatinine urine ratio        Kerby Nora, MD

## 2022-07-16 NOTE — Patient Instructions (Addendum)
Start losartan 50 mg daily for blood pressure control.  For back: Will repeat trial of prednisone  taper.  Start heat and home physical.  Quit smoking!  We will set up referral for blood flow check .   Continue lung cancer screening program.

## 2022-07-17 ENCOUNTER — Inpatient Hospital Stay: Payer: 59 | Attending: Nurse Practitioner

## 2022-07-17 DIAGNOSIS — D751 Secondary polycythemia: Secondary | ICD-10-CM | POA: Insufficient documentation

## 2022-07-17 DIAGNOSIS — E611 Iron deficiency: Secondary | ICD-10-CM | POA: Diagnosis present

## 2022-07-17 DIAGNOSIS — K9589 Other complications of other bariatric procedure: Secondary | ICD-10-CM

## 2022-07-17 DIAGNOSIS — E538 Deficiency of other specified B group vitamins: Secondary | ICD-10-CM | POA: Diagnosis not present

## 2022-07-17 DIAGNOSIS — G4730 Sleep apnea, unspecified: Secondary | ICD-10-CM | POA: Diagnosis not present

## 2022-07-17 DIAGNOSIS — D7589 Other specified diseases of blood and blood-forming organs: Secondary | ICD-10-CM | POA: Diagnosis not present

## 2022-07-17 DIAGNOSIS — F17210 Nicotine dependence, cigarettes, uncomplicated: Secondary | ICD-10-CM | POA: Insufficient documentation

## 2022-07-17 DIAGNOSIS — Z9884 Bariatric surgery status: Secondary | ICD-10-CM | POA: Insufficient documentation

## 2022-07-17 LAB — COMPREHENSIVE METABOLIC PANEL
ALT: 10 U/L (ref 0–44)
AST: 15 U/L (ref 15–41)
Albumin: 3.8 g/dL (ref 3.5–5.0)
Alkaline Phosphatase: 65 U/L (ref 38–126)
Anion gap: 10 (ref 5–15)
BUN: 21 mg/dL — ABNORMAL HIGH (ref 6–20)
CO2: 18 mmol/L — ABNORMAL LOW (ref 22–32)
Calcium: 8.7 mg/dL — ABNORMAL LOW (ref 8.9–10.3)
Chloride: 111 mmol/L (ref 98–111)
Creatinine, Ser: 0.89 mg/dL (ref 0.44–1.00)
GFR, Estimated: >60 mL/min (ref >60)
Glucose, Bld: 94 mg/dL (ref 70–99)
Potassium: 3.9 mmol/L (ref 3.5–5.1)
Sodium: 139 mmol/L (ref 135–145)
Total Bilirubin: 0.1 mg/dL — ABNORMAL LOW (ref 0.3–1.2)
Total Protein: 7.4 g/dL (ref 6.5–8.1)

## 2022-07-17 LAB — CBC WITH DIFFERENTIAL/PLATELET
Abs Immature Granulocytes: 0.03 10*3/uL (ref 0.00–0.07)
Basophils Absolute: 0 10*3/uL (ref 0.0–0.1)
Basophils Relative: 1 %
Eosinophils Absolute: 0.1 10*3/uL (ref 0.0–0.5)
Eosinophils Relative: 2 %
HCT: 44.9 % (ref 36.0–46.0)
Hemoglobin: 14.9 g/dL (ref 12.0–15.0)
Immature Granulocytes: 1 %
Lymphocytes Relative: 24 %
Lymphs Abs: 1.2 10*3/uL (ref 0.7–4.0)
MCH: 36.3 pg — ABNORMAL HIGH (ref 26.0–34.0)
MCHC: 33.2 g/dL (ref 30.0–36.0)
MCV: 109.2 fL — ABNORMAL HIGH (ref 80.0–100.0)
Monocytes Absolute: 0.4 10*3/uL (ref 0.1–1.0)
Monocytes Relative: 8 %
Neutro Abs: 3.3 10*3/uL (ref 1.7–7.7)
Neutrophils Relative %: 64 %
Platelets: 243 10*3/uL (ref 150–400)
RBC: 4.11 MIL/uL (ref 3.87–5.11)
RDW: 14.1 % (ref 11.5–15.5)
WBC: 5.1 10*3/uL (ref 4.0–10.5)
nRBC: 0 % (ref 0.0–0.2)

## 2022-07-17 LAB — IRON AND TIBC
Iron: 139 ug/dL (ref 28–170)
Saturation Ratios: 43 % — ABNORMAL HIGH (ref 10.4–31.8)
TIBC: 323 ug/dL (ref 250–450)
UIBC: 184 ug/dL

## 2022-07-17 LAB — FERRITIN: Ferritin: 9 ng/mL — ABNORMAL LOW (ref 11–307)

## 2022-07-17 LAB — VITAMIN B12: Vitamin B-12: 500 pg/mL (ref 180–914)

## 2022-07-17 LAB — FOLATE: Folate: 8.9 ng/mL (ref >5.9)

## 2022-07-18 ENCOUNTER — Other Ambulatory Visit: Payer: Self-pay | Admitting: *Deleted

## 2022-07-18 DIAGNOSIS — E538 Deficiency of other specified B group vitamins: Secondary | ICD-10-CM

## 2022-07-18 DIAGNOSIS — D508 Other iron deficiency anemias: Secondary | ICD-10-CM

## 2022-07-19 ENCOUNTER — Encounter: Payer: Self-pay | Admitting: Internal Medicine

## 2022-07-19 ENCOUNTER — Other Ambulatory Visit: Payer: 59

## 2022-07-19 ENCOUNTER — Inpatient Hospital Stay: Payer: 59

## 2022-07-19 ENCOUNTER — Inpatient Hospital Stay (HOSPITAL_BASED_OUTPATIENT_CLINIC_OR_DEPARTMENT_OTHER): Payer: 59 | Admitting: Internal Medicine

## 2022-07-19 ENCOUNTER — Ambulatory Visit: Payer: 59

## 2022-07-19 ENCOUNTER — Ambulatory Visit: Payer: 59 | Admitting: Internal Medicine

## 2022-07-19 VITALS — BP 150/77 | HR 83 | Temp 96.9°F | Resp 20 | Wt 138.1 lb

## 2022-07-19 DIAGNOSIS — D7589 Other specified diseases of blood and blood-forming organs: Secondary | ICD-10-CM

## 2022-07-19 DIAGNOSIS — K9589 Other complications of other bariatric procedure: Secondary | ICD-10-CM

## 2022-07-19 DIAGNOSIS — E538 Deficiency of other specified B group vitamins: Secondary | ICD-10-CM

## 2022-07-19 DIAGNOSIS — E611 Iron deficiency: Secondary | ICD-10-CM | POA: Diagnosis not present

## 2022-07-19 DIAGNOSIS — D508 Other iron deficiency anemias: Secondary | ICD-10-CM

## 2022-07-19 NOTE — Progress Notes (Signed)
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    #Secondary erythrocytosis  cigarette smoking and sleep apnea. [ elevated EPO, JAK2, V617F, exons 12-15, CALR, and MPL were negative. Carbon monoxide 11.8% (elevated).].    HISTORY OF PRESENTING ILLNESS: Ambulating independently.  Accompanied by her daughter. Brenda Hawkins 56 y.o.  female patient with a history of gastric bypass; iron deficiency is here for follow-up. Last received IV iron in August 2022. Has been receiving monthly b12.   Patient reports feeling well.Patient denies fever, chills, nausea, vomiting, shortness of breath, cough, abdominal pain, bleeding, bowel or bladder issues. Energy level is good.  Appetite is good.  Denies any weight loss. Denies pain.    Review of Systems  Constitutional:  Negative for chills, diaphoresis, fever, malaise/fatigue and weight loss.  HENT:  Negative for nosebleeds and sore throat.   Eyes:  Negative for double vision.  Respiratory:  Negative for cough, hemoptysis, sputum production, shortness of breath and wheezing.   Cardiovascular:  Negative for chest pain, palpitations, orthopnea and leg swelling.  Gastrointestinal:  Negative for abdominal pain, blood in stool, constipation, diarrhea, heartburn, melena, nausea and vomiting.  Genitourinary:  Negative for dysuria, frequency and urgency.  Musculoskeletal:  Negative for back pain and joint pain.  Skin: Negative.  Negative for itching and rash.  Neurological:  Negative for dizziness, tingling, focal weakness, weakness and headaches.  Endo/Heme/Allergies:  Does not bruise/bleed easily.  Psychiatric/Behavioral:  Negative for depression.  The patient is not nervous/anxious and does not have insomnia.      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   Vitamin B12 deficiency    since bariatric surgery    SURGICAL HISTORY: Past Surgical History:  Procedure Laterality Date   CHOLECYSTECTOMY N/A 02/17/2019   Procedure: LAPAROSCOPIC CHOLECYSTECTOMY - DIABETIC;  Surgeon: Ancil Linsey, MD;  Location: [facility];  Service: General;  Laterality: N/A;   CYSTOSCOPY W/ URETERAL STENT PLACEMENT Left 11/11/2018   Procedure: CYSTOSCOPY WITH STENT REMOVAL;  Surgeon: Riki Altes, MD;  Location: [facility];  Service: Urology;  Laterality: Left;   CYSTOSCOPY WITH HOLMIUM LASER LITHOTRIPSY Left 10/24/2018   Procedure: CYSTOSCOPY WITH HOLMIUM LASER LITHOTRIPSy;  Surgeon: Crista Elliot, MD;  Location: [facility];  Service: Urology;  Laterality: Left;   CYSTOSCOPY WITH STENT PLACEMENT Bilateral 10/24/2018   Procedure: CYSTOSCOPY WITH STENT PLACEMENT;  Surgeon: Crista Elliot, MD;  Location: [facility];  Service: Urology;  Laterality: Bilateral;   CYSTOSCOPY/URETEROSCOPY/HOLMIUM LASER/STENT PLACEMENT Right 11/11/2018   Procedure: CYSTOSCOPY/URETEROSCOPY/HOLMIUM LASER/STENT EXCHANGE;  Surgeon: Riki Altes, MD;  Location: [facility];  Service: Urology;  Laterality: Right;   ESOPHAGOGASTRODUODENOSCOPY (EGD) WITH PROPOFOL N/A 06/01/2018   Procedure: ESOPHAGOGASTRODUODENOSCOPY (EGD) WITH PROPOFOL;  Surgeon: Toney Reil,  MD;  Location: [facility];  Service: Gastroenterology;  Laterality: N/A;   GALLBLADDER SURGERY  02/17/2019   GASTRIC BYPASS  03/17/02   REDUCTION MAMMAPLASTY Right 1992   URETEROSCOPY Left 10/24/2018    Procedure: URETEROSCOPY;  Surgeon: Lucas Mallow, MD;  Location: [facility];  Service: Urology;  Laterality: Left;    SOCIAL HISTORY: Social History   Socioeconomic History   Marital status: Married    Spouse name: Not on file   Number of children: 1   Years of education: Not on file   Highest education level: Not on file  Occupational History   Occupation: homemaker  Tobacco Use   Smoking status: Every Day    Packs/day: 1.50    Years: 37.00    Total pack years: 55.50    Types: Cigarettes   Smokeless tobacco: Never  Vaping Use   Vaping Use: Never used  Substance and Sexual Activity   Alcohol use: No   Drug use: No   Sexual activity: Yes    Birth control/protection: None, Post-menopausal  Other Topics Concern   Not on file  Social History Narrative   Sporadic with exercise   Social Determinants of Health   Financial Resource Strain: Not on file  Food Insecurity: Not on file  Transportation Needs: Not on file  Physical Activity: Not on file  Stress: Not on file  Social Connections: Not on file  Intimate Partner Violence: Not on file    FAMILY HISTORY: Family History  Problem Relation Age of Onset   Heart disease Mother        valve replacement surgery   Coronary artery disease Father    Breast cancer Neg Hx     ALLERGIES:  is allergic to codeine sulfate, penicillins, adhesive [tape], and latex.  MEDICATIONS:  Current Outpatient Medications  Medication Sig Dispense Refill   albuterol (VENTOLIN HFA) 108 (90 Base) MCG/ACT inhaler Inhale 2 puffs into the lungs every 6 (six) hours as needed for wheezing or shortness of breath. 6.7 g 0   atorvastatin (LIPITOR) 10 MG tablet Take 1 tablet (10 mg total) by mouth daily. 30 tablet 11   esomeprazole (NEXIUM) 40 MG capsule Take 1 capsule (40 mg total) by mouth 2 (two) times daily before a meal. 60 capsule 11   losartan (COZAAR) 50 MG tablet Take 1 tablet (50 mg total) by mouth daily. 30 tablet 11   predniSONE  (DELTASONE) 20 MG tablet 3 tabs by mouth daily x 3 days, then 2 tabs by mouth daily x 2 days then 1 tab by mouth daily x 2 days 15 tablet 0   No current facility-administered medications for this visit.   Facility-Administered Medications Ordered in Other Visits  Medication Dose Route Frequency Provider Last Rate Last Admin   sodium chloride 0.9 % injection 10 mL  10 mL Intracatheter PRN Lloyd Huger, MD          .  PHYSICAL EXAMINATION:  Vitals:   07/19/22 1335  BP: (!) 150/77  Pulse: 83  Resp: 20  Temp: (!) 96.9 F (36.1 C)  SpO2: 100%    Filed Weights   07/19/22 1335  Weight: 138 lb 1.6 oz (62.6 kg)    Physical Exam Vitals reviewed.  Constitutional:      Appearance: She is not ill-appearing.  Eyes:     General: No scleral icterus.    Conjunctiva/sclera: Conjunctivae normal.  Cardiovascular:     Rate and Rhythm: Normal rate and regular rhythm.  Abdominal:  General: There is no distension.     Palpations: Abdomen is soft.     Tenderness: There is no abdominal tenderness. There is no guarding.  Musculoskeletal:        General: No deformity.     Right lower leg: No edema.     Left lower leg: No edema.  Lymphadenopathy:     Cervical: No cervical adenopathy.  Skin:    General: Skin is warm and dry.  Neurological:     Mental Status: She is alert and oriented to person, place, and time. Mental status is at baseline.  Psychiatric:        Mood and Affect: Mood normal.        Behavior: Behavior normal.      LABORATORY DATA:  I have reviewed the data as listed Lab Results  Component Value Date   WBC 5.1 07/17/2022   HGB 14.9 07/17/2022   HCT 44.9 07/17/2022   MCV 109.2 (H) 07/17/2022   PLT 243 07/17/2022   Recent Labs    12/10/21 1127 01/15/22 1302 07/17/22 1339  NA 137 137 139  K 3.4* 3.8 3.9  CL 99 107 111  CO2 25 21* 18*  GLUCOSE 122* 79 94  BUN 19 23* 21*  CREATININE 0.63 0.56 0.89  CALCIUM 10.1 8.9 8.7*  GFRNONAA  --  >60 >60   PROT 7.5 7.7 7.4  ALBUMIN 4.2 3.9 3.8  AST 15 16 15   ALT 8 9 10   ALKPHOS 80 73 65  BILITOT 0.4 0.4 0.1*  Iron/TIBC/Ferritin/ %Sat    Component Value Date/Time   IRON 139 07/17/2022 1339   IRON 14 (L) 01/02/2015 1507   TIBC 323 07/17/2022 1339   TIBC 447 01/02/2015 1507   FERRITIN 9 (L) 07/17/2022 1339   FERRITIN 2 (L) 01/02/2015 1507   IRONPCTSAT 43 (H) 07/17/2022 1339   IRONPCTSAT 3.1 01/02/2015 1507   Lab Results  Component Value Date   VITAMINB12 500 07/17/2022     RADIOGRAPHIC STUDIES: I have personally reviewed the radiological images as listed and agreed with the findings in the report. No results found.  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  Hemoglobin 14.9 today.  Ferritin is trending down to 9 however her saturation is 43.  Because her hemoglobin is normal and patient has been feeling well, she decided to hold off on IV iron until next time.  She will call us sooner if she starts feeling fatigued.   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 Continue monthly b12.  B12 today 500.   # Macrocytosis- Etiology unclear. No known liver disease or MDS. B12 deficiency and folate deficiency has been treated. Plan for bone marrow to rule out MDS if counts decline. Continue to monitor.     # Secondary erythrocytosis- likely secondary to cigarette smoking and sleep apnea. Patient is cutting back on cigarettes and is not interested in sleep apnea testing. Can discuss with PCP. Work up previously revealed slightly elevated EPO, JAK2, V617F, exons 12-15, CALR, and MPL were negative. Carbon monoxide 11.8% (elevated). Continue to monitor.   # DISPOSITION: # b12 injections monthly x 6.  Patient wants to start next week. #No Venofer today. # RTC in 6 months for labs [cbc, cmp, ferritin, iron studies, folate, b12], See Dr. Rogue Bussing day later +/- venofer and/or b12- la  No problem-specific  Assessment & Plan notes found for this encounter.   All questions were answered.  The patient knows to call the clinic with any problems, questions or concerns.   Jane Canary, MD 07/19/2022

## 2022-08-19 ENCOUNTER — Inpatient Hospital Stay: Payer: 59 | Attending: Nurse Practitioner

## 2022-08-19 ENCOUNTER — Ambulatory Visit: Payer: 59

## 2022-08-19 DIAGNOSIS — D508 Other iron deficiency anemias: Secondary | ICD-10-CM

## 2022-08-19 DIAGNOSIS — E538 Deficiency of other specified B group vitamins: Secondary | ICD-10-CM | POA: Diagnosis not present

## 2022-08-19 MED ORDER — CYANOCOBALAMIN 1000 MCG/ML IJ SOLN
1000.0000 ug | Freq: Once | INTRAMUSCULAR | Status: AC
  Administered 2022-08-19: 1000 ug via INTRAMUSCULAR
  Filled 2022-08-19: qty 1

## 2022-08-26 NOTE — Assessment & Plan Note (Signed)
Followed by hematology.  Has upcoming office visit next week along with scheduled CBC and B12 lab work.

## 2022-08-26 NOTE — Assessment & Plan Note (Addendum)
Discussed in detail smoking cessation techniques.  patient pre-contemplative.

## 2022-08-26 NOTE — Assessment & Plan Note (Signed)
Encouraged exercise, weight loss, healthy eating habits. ? ?

## 2022-08-26 NOTE — Assessment & Plan Note (Signed)
Chronic, stable control  Using albuterol prn. No recent flares

## 2022-08-26 NOTE — Assessment & Plan Note (Signed)
Due for reevaluation 

## 2022-08-26 NOTE — Assessment & Plan Note (Signed)
Chronic, combination of elevated blood pressure and whitecoat hypertension Start losartan 50 mg daily for blood pressure control.  Follow blood pressure at home with goal less than 140/90.

## 2022-08-26 NOTE — Assessment & Plan Note (Signed)
LDL goal less than 70.  Currently on statin medication.

## 2022-08-26 NOTE — Assessment & Plan Note (Signed)
Due for lab reevaluation.

## 2022-08-26 NOTE — Assessment & Plan Note (Signed)
Stable on nexium, has tried to stop but cannot without return of symptoms. NSAIDs contraindicated. Poor absorption.. needs B12 and vit d to be followed.

## 2022-09-18 ENCOUNTER — Inpatient Hospital Stay: Payer: 59 | Attending: Nurse Practitioner

## 2022-09-18 ENCOUNTER — Ambulatory Visit: Payer: 59

## 2022-09-18 DIAGNOSIS — E538 Deficiency of other specified B group vitamins: Secondary | ICD-10-CM | POA: Diagnosis present

## 2022-09-18 DIAGNOSIS — D508 Other iron deficiency anemias: Secondary | ICD-10-CM

## 2022-09-18 MED ORDER — CYANOCOBALAMIN 1000 MCG/ML IJ SOLN
1000.0000 ug | Freq: Once | INTRAMUSCULAR | Status: AC
  Administered 2022-09-18: 1000 ug via INTRAMUSCULAR
  Filled 2022-09-18: qty 1

## 2022-10-21 ENCOUNTER — Inpatient Hospital Stay: Payer: 59 | Attending: Nurse Practitioner

## 2022-10-21 ENCOUNTER — Ambulatory Visit: Payer: 59

## 2022-10-21 DIAGNOSIS — E538 Deficiency of other specified B group vitamins: Secondary | ICD-10-CM | POA: Diagnosis present

## 2022-10-21 DIAGNOSIS — K9589 Other complications of other bariatric procedure: Secondary | ICD-10-CM

## 2022-10-21 MED ORDER — CYANOCOBALAMIN 1000 MCG/ML IJ SOLN
1000.0000 ug | Freq: Once | INTRAMUSCULAR | Status: AC
  Administered 2022-10-21: 1000 ug via INTRAMUSCULAR
  Filled 2022-10-21: qty 1

## 2022-11-18 ENCOUNTER — Inpatient Hospital Stay: Payer: 59 | Attending: Nurse Practitioner

## 2022-11-18 DIAGNOSIS — K9589 Other complications of other bariatric procedure: Secondary | ICD-10-CM

## 2022-11-18 DIAGNOSIS — E538 Deficiency of other specified B group vitamins: Secondary | ICD-10-CM | POA: Insufficient documentation

## 2022-11-18 MED ORDER — CYANOCOBALAMIN 1000 MCG/ML IJ SOLN
1000.0000 ug | Freq: Once | INTRAMUSCULAR | Status: AC
  Administered 2022-11-18: 1000 ug via INTRAMUSCULAR
  Filled 2022-11-18: qty 1

## 2022-12-18 ENCOUNTER — Inpatient Hospital Stay: Payer: 59 | Attending: Nurse Practitioner

## 2022-12-18 DIAGNOSIS — E538 Deficiency of other specified B group vitamins: Secondary | ICD-10-CM | POA: Insufficient documentation

## 2022-12-18 DIAGNOSIS — D508 Other iron deficiency anemias: Secondary | ICD-10-CM

## 2022-12-18 MED ORDER — CYANOCOBALAMIN 1000 MCG/ML IJ SOLN
1000.0000 ug | Freq: Once | INTRAMUSCULAR | Status: AC
  Administered 2022-12-18: 1000 ug via INTRAMUSCULAR
  Filled 2022-12-18: qty 1

## 2022-12-24 ENCOUNTER — Ambulatory Visit: Payer: 59 | Admitting: Urology

## 2023-01-07 ENCOUNTER — Other Ambulatory Visit (INDEPENDENT_AMBULATORY_CARE_PROVIDER_SITE_OTHER): Payer: 59

## 2023-01-07 DIAGNOSIS — E119 Type 2 diabetes mellitus without complications: Secondary | ICD-10-CM

## 2023-01-07 DIAGNOSIS — M5442 Lumbago with sciatica, left side: Secondary | ICD-10-CM

## 2023-01-07 DIAGNOSIS — R0989 Other specified symptoms and signs involving the circulatory and respiratory systems: Secondary | ICD-10-CM | POA: Diagnosis not present

## 2023-01-07 LAB — COMPREHENSIVE METABOLIC PANEL
ALT: 8 U/L (ref 0–35)
AST: 15 U/L (ref 0–37)
Albumin: 4.2 g/dL (ref 3.5–5.2)
Alkaline Phosphatase: 58 U/L (ref 39–117)
BUN: 21 mg/dL (ref 6–23)
CO2: 24 mEq/L (ref 19–32)
Calcium: 9 mg/dL (ref 8.4–10.5)
Chloride: 110 mEq/L (ref 96–112)
Creatinine, Ser: 0.67 mg/dL (ref 0.40–1.20)
GFR: 96.51 mL/min (ref >60.00)
Glucose, Bld: 71 mg/dL (ref 70–99)
Potassium: 3.8 mEq/L (ref 3.5–5.1)
Sodium: 143 mEq/L (ref 135–145)
Total Bilirubin: 0.2 mg/dL (ref 0.2–1.2)
Total Protein: 7.1 g/dL (ref 6.0–8.3)

## 2023-01-07 LAB — MICROALBUMIN / CREATININE URINE RATIO
Creatinine,U: 111 mg/dL
Microalb Creat Ratio: 3.9 mg/g (ref 0.0–30.0)
Microalb, Ur: 4.4 mg/dL — ABNORMAL HIGH (ref 0.0–1.9)

## 2023-01-07 LAB — LIPID PANEL
Cholesterol: 175 mg/dL (ref 0–200)
HDL: 36.8 mg/dL — ABNORMAL LOW (ref >39.00)
LDL Cholesterol: 101 mg/dL — ABNORMAL HIGH (ref 0–99)
NonHDL: 137.74
Total CHOL/HDL Ratio: 5
Triglycerides: 184 mg/dL — ABNORMAL HIGH (ref 0.0–149.0)
VLDL: 36.8 mg/dL (ref 0.0–40.0)

## 2023-01-07 LAB — HEMOGLOBIN A1C: Hgb A1c MFr Bld: 7.1 % — ABNORMAL HIGH (ref 4.6–6.5)

## 2023-01-07 NOTE — Progress Notes (Signed)
No critical labs need to be addressed urgently. We will discuss labs in detail at upcoming office visit.   

## 2023-01-13 ENCOUNTER — Inpatient Hospital Stay: Payer: 59 | Attending: Nurse Practitioner

## 2023-01-13 DIAGNOSIS — E538 Deficiency of other specified B group vitamins: Secondary | ICD-10-CM

## 2023-01-13 DIAGNOSIS — K9589 Other complications of other bariatric procedure: Secondary | ICD-10-CM

## 2023-01-13 LAB — CBC WITH DIFFERENTIAL/PLATELET
Abs Immature Granulocytes: 0.03 10*3/uL (ref 0.00–0.07)
Basophils Absolute: 0.1 10*3/uL (ref 0.0–0.1)
Basophils Relative: 1 %
Eosinophils Absolute: 0.2 10*3/uL (ref 0.0–0.5)
Eosinophils Relative: 4 %
HCT: 44.5 % (ref 36.0–46.0)
Hemoglobin: 14.9 g/dL (ref 12.0–15.0)
Immature Granulocytes: 1 %
Lymphocytes Relative: 25 %
Lymphs Abs: 1.3 10*3/uL (ref 0.7–4.0)
MCH: 35.2 pg — ABNORMAL HIGH (ref 26.0–34.0)
MCHC: 33.5 g/dL (ref 30.0–36.0)
MCV: 105.2 fL — ABNORMAL HIGH (ref 80.0–100.0)
Monocytes Absolute: 0.4 10*3/uL (ref 0.1–1.0)
Monocytes Relative: 8 %
Neutro Abs: 3.2 10*3/uL (ref 1.7–7.7)
Neutrophils Relative %: 61 %
Platelets: 250 10*3/uL (ref 150–400)
RBC: 4.23 MIL/uL (ref 3.87–5.11)
RDW: 14.9 % (ref 11.5–15.5)
WBC: 5.2 10*3/uL (ref 4.0–10.5)
nRBC: 0 % (ref 0.0–0.2)

## 2023-01-13 LAB — IRON AND TIBC
Iron: 63 ug/dL (ref 28–170)
Saturation Ratios: 17 % (ref 10.4–31.8)
TIBC: 368 ug/dL (ref 250–450)
UIBC: 305 ug/dL

## 2023-01-13 LAB — COMPREHENSIVE METABOLIC PANEL
ALT: 12 U/L (ref 0–44)
AST: 18 U/L (ref 15–41)
Albumin: 4.1 g/dL (ref 3.5–5.0)
Alkaline Phosphatase: 59 U/L (ref 38–126)
Anion gap: 12 (ref 5–15)
BUN: 21 mg/dL — ABNORMAL HIGH (ref 6–20)
CO2: 20 mmol/L — ABNORMAL LOW (ref 22–32)
Calcium: 9.1 mg/dL (ref 8.9–10.3)
Chloride: 108 mmol/L (ref 98–111)
Creatinine, Ser: 0.69 mg/dL (ref 0.44–1.00)
GFR, Estimated: >60 mL/min (ref >60)
Glucose, Bld: 87 mg/dL (ref 70–99)
Potassium: 3.7 mmol/L (ref 3.5–5.1)
Sodium: 140 mmol/L (ref 135–145)
Total Bilirubin: 0.2 mg/dL — ABNORMAL LOW (ref 0.3–1.2)
Total Protein: 7.7 g/dL (ref 6.5–8.1)

## 2023-01-13 LAB — FOLATE: Folate: 12.5 ng/mL (ref >5.9)

## 2023-01-13 LAB — FERRITIN: Ferritin: 6 ng/mL — ABNORMAL LOW (ref 11–307)

## 2023-01-13 LAB — VITAMIN B12: Vitamin B-12: 510 pg/mL (ref 180–914)

## 2023-01-14 ENCOUNTER — Ambulatory Visit: Payer: 59 | Admitting: Family Medicine

## 2023-01-14 VITALS — BP 136/78 | HR 81 | Temp 97.7°F | Ht 59.5 in | Wt 132.0 lb

## 2023-01-14 DIAGNOSIS — E1169 Type 2 diabetes mellitus with other specified complication: Secondary | ICD-10-CM

## 2023-01-14 DIAGNOSIS — M5442 Lumbago with sciatica, left side: Secondary | ICD-10-CM

## 2023-01-14 DIAGNOSIS — E785 Hyperlipidemia, unspecified: Secondary | ICD-10-CM

## 2023-01-14 DIAGNOSIS — I152 Hypertension secondary to endocrine disorders: Secondary | ICD-10-CM

## 2023-01-14 DIAGNOSIS — E119 Type 2 diabetes mellitus without complications: Secondary | ICD-10-CM | POA: Diagnosis not present

## 2023-01-14 DIAGNOSIS — E1159 Type 2 diabetes mellitus with other circulatory complications: Secondary | ICD-10-CM | POA: Diagnosis not present

## 2023-01-14 DIAGNOSIS — G8929 Other chronic pain: Secondary | ICD-10-CM

## 2023-01-14 NOTE — Assessment & Plan Note (Signed)
Chronic, inadequate control on no medication.

## 2023-01-14 NOTE — Assessment & Plan Note (Addendum)
Chronic Likely causing numbness in left leg.  Doing home PT.  Only occ pain in low back, not limiting her activity/functioning.  Offered referral to back specialist for  further eval/possible injection but she is not interested.  She will try a compression sock to see if this helps with sensation of swelling ( no swelling seen ) in left foot.  ABI ordered in past but I do not see results, ? If she went.  If not improving consider ABIs to rule out PAD.

## 2023-01-14 NOTE — Assessment & Plan Note (Signed)
Stable, chronic.  Continue current medication.  Losartan 50 mg p.o. daily 

## 2023-01-14 NOTE — Assessment & Plan Note (Addendum)
Chronic,  LDL NOT at goal less than 70 given aortic atherosclerosis.  atorvastatin 10 mg p.o. daily.  She will work on low cholesterol diet and  restart exercise.

## 2023-01-14 NOTE — Progress Notes (Signed)
Patient ID: Brenda Hawkins, female    DOB: [birth year], 57 y.o.   MRN: [id number]  This visit was conducted in person.  BP 136/78   Pulse 81   Temp 97.7 F (36.5 C) (Temporal)   Ht 4' 11.5" (1.511 m)   Wt 132 lb (59.9 kg)   SpO2 97%   BMI 26.21 kg/m    CC:  Chief Complaint  Patient presents with   Diabetes    6 month follow up    Subjective:   HPI: Brenda Hawkins is a 57 y.o. female presenting on 01/14/2023 for Diabetes (6 month follow up)   Hx of gastric bypass.  Diabetes: tolerable  control with diet. Has been eating more past asalad and cracker lately. Lab Results  Component Value Date   HGBA1C 7.1 (H) 01/07/2023  Using medications without difficulties: Hypoglycemic episodes: Hyperglycemic episodes: Feet problems: no ulcers Blood Sugars averaging: not checking eye exam within last year: yes Normal microalbumin/creatinine  Wt Readings from Last 3 Encounters:  01/14/23 132 lb (59.9 kg)  07/19/22 138 lb 1.6 oz (62.6 kg)  07/16/22 131 lb 4 oz (59.5 kg)   Body mass index is 26.21 kg/m.  Elevated Cholesterol: LDL  Not at goal less than 70 with history of aortic atherosclerosis.  On atorvastatin 10 mg daily.   Lab Results  Component Value Date   CHOL 175 01/07/2023   HDL 36.80 (L) 01/07/2023   LDLCALC 101 (H) 01/07/2023   LDLDIRECT 87.0 12/10/2021   TRIG 184.0 (H) 01/07/2023   CHOLHDL 5 01/07/2023  Using medications without problems: none Muscle aches: none Diet compliance:  good Exercise:  walking some Other complaints:  Hypertension:  Blood pressure at goal.  ON losartan 50 mg p.o. daily BP Readings from Last 3 Encounters:  01/14/23 136/78  07/19/22 (!) 150/77  07/16/22 (!) 170/88  Using medication without problems or lightheadedness: none  Chest pain with exertion: non Edema: none Short of breath: none Average home BPs: not checking. Other issues:   Relevant past medical, surgical, family and social history reviewed and updated as  indicated. Interim medical history since our last visit reviewed. Allergies and medications reviewed and updated. Outpatient Medications Prior to Visit  Medication Sig Dispense Refill   albuterol (VENTOLIN HFA) 108 (90 Base) MCG/ACT inhaler Inhale 2 puffs into the lungs every 6 (six) hours as needed for wheezing or shortness of breath. 6.7 g 0   atorvastatin (LIPITOR) 10 MG tablet Take 1 tablet (10 mg total) by mouth daily. 30 tablet 11   esomeprazole (NEXIUM) 40 MG capsule Take 1 capsule (40 mg total) by mouth 2 (two) times daily before a meal. 60 capsule 11   losartan (COZAAR) 50 MG tablet Take 1 tablet (50 mg total) by mouth daily. 30 tablet 11   predniSONE (DELTASONE) 20 MG tablet 3 tabs by mouth daily x 3 days, then 2 tabs by mouth daily x 2 days then 1 tab by mouth daily x 2 days 15 tablet 0   Facility-Administered Medications Prior to Visit  Medication Dose Route Frequency Provider Last Rate Last Admin   sodium chloride 0.9 % injection 10 mL  10 mL Intracatheter PRN Jeralyn Ruths, MD         Per HPI unless specifically indicated in ROS section below Review of Systems  Constitutional:  Negative for fatigue and fever.  HENT:  Negative for ear pain.   Eyes:  Negative for pain.  Respiratory:  Negative for chest tightness  and shortness of breath.   Cardiovascular:  Negative for chest pain, palpitations and leg swelling.  Gastrointestinal:  Negative for abdominal pain.  Genitourinary:  Negative for dysuria.   Objective:  BP 136/78   Pulse 81   Temp 97.7 F (36.5 C) (Temporal)   Ht 4' 11.5" (1.511 m)   Wt 132 lb (59.9 kg)   SpO2 97%   BMI 26.21 kg/m   Wt Readings from Last 3 Encounters:  01/14/23 132 lb (59.9 kg)  07/19/22 138 lb 1.6 oz (62.6 kg)  07/16/22 131 lb 4 oz (59.5 kg)      Physical Exam Constitutional:      General: She is not in acute distress.    Appearance: Normal appearance. She is well-developed. She is not ill-appearing or toxic-appearing.  HENT:      Head: Normocephalic.     Right Ear: Hearing, tympanic membrane, ear canal and external ear normal. Tympanic membrane is not erythematous, retracted or bulging.     Left Ear: Hearing, tympanic membrane, ear canal and external ear normal. Tympanic membrane is not erythematous, retracted or bulging.     Nose: No mucosal edema or rhinorrhea.     Right Sinus: No maxillary sinus tenderness or frontal sinus tenderness.     Left Sinus: No maxillary sinus tenderness or frontal sinus tenderness.     Mouth/Throat:     Pharynx: Uvula midline.  Eyes:     General: Lids are normal. Lids are everted, no foreign bodies appreciated.     Conjunctiva/sclera: Conjunctivae normal.     Pupils: Pupils are equal, round, and reactive to light.  Neck:     Thyroid: No thyroid mass or thyromegaly.     Vascular: No carotid bruit.     Trachea: Trachea normal.  Cardiovascular:     Rate and Rhythm: Normal rate and regular rhythm.     Pulses: Normal pulses.     Heart sounds: Normal heart sounds, S1 normal and S2 normal. No murmur heard.    No friction rub. No gallop.  Pulmonary:     Effort: Pulmonary effort is normal. No tachypnea or respiratory distress.     Breath sounds: Normal breath sounds. No decreased breath sounds, wheezing, rhonchi or rales.  Abdominal:     General: Bowel sounds are normal.     Palpations: Abdomen is soft.     Tenderness: There is no abdominal tenderness.  Musculoskeletal:     Cervical back: Normal range of motion and neck supple.  Skin:    General: Skin is warm and dry.     Findings: No rash.  Neurological:     Mental Status: She is alert.     Cranial Nerves: Cranial nerves 2-12 are intact.     Sensory: Sensory deficit present.     Motor: Motor function is intact.     Coordination: Coordination is intact.     Comments: Left lower leg with numbness  Psychiatric:        Mood and Affect: Mood is not anxious or depressed.        Speech: Speech normal.        Behavior: Behavior  normal. Behavior is cooperative.        Thought Content: Thought content normal.        Judgment: Judgment normal.       Diabetic foot exam: Normal inspection No skin breakdown No calluses  Decreasedl DP pulses Normal sensation to light touch and monofilament Nails normal  Results for orders placed  or performed in visit on 01/13/23  Vitamin B12  Result Value Ref Range   Vitamin B-12 510 180 - 914 pg/mL  Folate  Result Value Ref Range   Folate 12.5 >5.9 ng/mL  Iron and TIBC([facility])  Result Value Ref Range   Iron 63 28 - 170 ug/dL   TIBC 409 811 - 914 ug/dL   Saturation Ratios 17 10.4 - 31.8 %   UIBC 305 ug/dL  Ferritin  Result Value Ref Range   Ferritin 6 (L) 11 - 307 ng/mL  Comprehensive metabolic panel  Result Value Ref Range   Sodium 140 135 - 145 mmol/L   Potassium 3.7 3.5 - 5.1 mmol/L   Chloride 108 98 - 111 mmol/L   CO2 20 (L) 22 - 32 mmol/L   Glucose, Bld 87 70 - 99 mg/dL   BUN 21 (H) 6 - 20 mg/dL   Creatinine, Ser 7.82 0.44 - 1.00 mg/dL   Calcium 9.1 8.9 - 95.6 mg/dL   Total Protein 7.7 6.5 - 8.1 g/dL   Albumin 4.1 3.5 - 5.0 g/dL   AST 18 15 - 41 U/L   ALT 12 0 - 44 U/L   Alkaline Phosphatase 59 38 - 126 U/L   Total Bilirubin 0.2 (L) 0.3 - 1.2 mg/dL   GFR, Estimated >21 >30 mL/min   Anion gap 12 5 - 15  CBC with Differential/Platelet  Result Value Ref Range   WBC 5.2 4.0 - 10.5 K/uL   RBC 4.23 3.87 - 5.11 MIL/uL   Hemoglobin 14.9 12.0 - 15.0 g/dL   HCT 86.5 78.4 - 69.6 %   MCV 105.2 (H) 80.0 - 100.0 fL   MCH 35.2 (H) 26.0 - 34.0 pg   MCHC 33.5 30.0 - 36.0 g/dL   RDW 29.5 28.4 - 13.2 %   Platelets 250 150 - 400 K/uL   nRBC 0.0 0.0 - 0.2 %   Neutrophils Relative % 61 %   Neutro Abs 3.2 1.7 - 7.7 K/uL   Lymphocytes Relative 25 %   Lymphs Abs 1.3 0.7 - 4.0 K/uL   Monocytes Relative 8 %   Monocytes Absolute 0.4 0.1 - 1.0 K/uL   Eosinophils Relative 4 %   Eosinophils Absolute 0.2 0.0 - 0.5 K/uL   Basophils Relative 1 %   Basophils  Absolute 0.1 0.0 - 0.1 K/uL   Immature Granulocytes 1 %   Abs Immature Granulocytes 0.03 0.00 - 0.07 K/uL     COVID 19 screen:  No recent travel or known exposure to COVID19 The patient denies respiratory symptoms of COVID 19 at this time. The importance of social distancing was discussed today.   Assessment and Plan    Problem List Items Addressed This Visit     Chronic left-sided low back pain with left-sided sciatica     Chronic Likely causing numbness in left leg.  Doing home PT.  Only occ pain in low back, not limiting her activity/functioning.  Offered referral to back specialist for  further eval/possible injection but she is not interested.  She will try a compression sock to see if this helps with sensation of swelling ( no swelling seen ) in left foot.  ABI ordered in past but I do not see results, ? If she went.  If not improving consider ABIs to rule out PAD.      Controlled type 2 diabetes mellitus without complication, without long-term current use of insulin (HCC) - Primary    Chronic, inadequate control on no  medication.      Hyperlipidemia associated with type 2 diabetes mellitus (HCC)    Chronic,  LDL NOT at goal less than 70 given aortic atherosclerosis.  atorvastatin 10 mg p.o. daily.  She will work on low cholesterol diet and  restart exercise.      Hypertension associated with diabetes (HCC)    Stable, chronic.  Continue current medication.  Losartan 50 mg p.o. daily       Kerby Nora, MD

## 2023-01-14 NOTE — Patient Instructions (Addendum)
Get back on track with low cholesterol and low carb diet.  Start  regular exercise.

## 2023-01-17 ENCOUNTER — Inpatient Hospital Stay (HOSPITAL_BASED_OUTPATIENT_CLINIC_OR_DEPARTMENT_OTHER): Payer: 59 | Admitting: Internal Medicine

## 2023-01-17 ENCOUNTER — Encounter: Payer: Self-pay | Admitting: Internal Medicine

## 2023-01-17 ENCOUNTER — Inpatient Hospital Stay: Payer: 59

## 2023-01-17 VITALS — BP 168/86 | HR 77 | Temp 99.0°F | Resp 20 | Wt 132.6 lb

## 2023-01-17 DIAGNOSIS — K9589 Other complications of other bariatric procedure: Secondary | ICD-10-CM

## 2023-01-17 DIAGNOSIS — D508 Other iron deficiency anemias: Secondary | ICD-10-CM

## 2023-01-17 DIAGNOSIS — E538 Deficiency of other specified B group vitamins: Secondary | ICD-10-CM | POA: Diagnosis not present

## 2023-01-17 DIAGNOSIS — Z87891 Personal history of nicotine dependence: Secondary | ICD-10-CM

## 2023-01-17 MED ORDER — CYANOCOBALAMIN 1000 MCG/ML IJ SOLN
1000.0000 ug | Freq: Once | INTRAMUSCULAR | Status: AC
  Administered 2023-01-17: 1000 ug via INTRAMUSCULAR

## 2023-01-17 NOTE — Patient Instructions (Signed)
#   Recommend barium melt with iron [over-the-counter/can order through [employer]]-1 pill under the tongue pill once a day.  Call us if any questions.

## 2023-01-17 NOTE — Progress Notes (Signed)
Pt highly anxious regarding possibility of iron infusion. Pt denies any symptoms of anemia. No blood in stool. No dizziness or dyspnea. Denies pain. Appetite is normal.

## 2023-01-17 NOTE — Assessment & Plan Note (Signed)
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 Not anemic today. Hemoglobin 16.   No venofer today; ferritin is 6.  Patient asymptomatic.  Recommend Bari melt over-the-counter.    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  On monthly B12 junctions.  May 2024-B12 levels 510.  Proceed with injection today and then as needed.  Beta melt as above.   #  Macrocytosis- Etiology unclear. No known liver disease or MDS. B12 deficiency and folate deficiency has been treatment. Previously, bone marrow to rule out MDS if counts decline.  Continue monitor.  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 Patient is cutting back on cigarettes and is not interested in sleep apnea testing. Can discuss with PCP. Work up previously revealed slightly elevated EPO, JAK2, V617F, exons 12-15, CALR, and MPL were negative. Carbon monoxide 11.8% (elevated). Continue to monitor.   # smoking: Discussed with the patient regarding the ill effects of smoking- including but not limited to cardiac lung and vascular diseases and malignancies. Counseled against smoking.  Discussed regarding lung cancer screening program at length; which includes low-dose CT scan on annual basis for 5 years.  Patient last lung cancer screening CT-2021.  Will send a referral to lung cancer screening program.    # DISPOSITION: # Lung cancer screening program re: Hx of smoking # No venofer; B12 today-  # RTC in 6 months- MD; labs- 1 -2 days prior-  for labs [cbc, cmp, ferritin, iron studies, folate, b12],  venofer +/- b12- Dr.B

## 2023-01-17 NOTE — Progress Notes (Signed)
I connected with Brenda Hawkins on 01/17/23 at  1:45 PM EDT by video enabled telemedicine visit and verified that I am speaking with the correct person using two identifiers.  I discussed the limitations, risks, security and privacy concerns of performing an evaluation and management service by telemedicine and the availability of in-person appointments. I also discussed with the patient that there may be a patient responsible charge related to this service. The patient expressed understanding and agreed to proceed.    Other persons participating in the visit and their role in the encounter: RN/medical reconciliation Patient's location: office Provider's location: home  Oncology History   No history exists.   Chief Complaint: anemia  History of present illness:Brenda Hawkins 57 y.o.  female with history of bariatric surgery-with iron deficiency/B12 deficiency is here for follow-up.  Patient is not on iron supplementation because of malabsorption.   Pt highly anxious regarding possibility of iron infusion. Pt denies any symptoms of anemia. No blood in stool. No dizziness or dyspnea. Denies pain. Appetite is normal.  Unfortunately continues to smoke.  Observation/objective: Alert & oriented x 3. In No acute distress.   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 Not anemic today. Hemoglobin 16.   No venofer today; ferritin is 6.  Patient asymptomatic.  Recommend Bari melt over-the-counter.    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  On monthly B12 junctions.  May 2024-B12 levels 510.  Proceed with injection today and then as needed.  Beta melt as above.   #  Macrocytosis- Etiology unclear. No known liver disease or MDS. B12 deficiency and folate deficiency has been treatment. Previously, bone marrow to rule out MDS if  counts decline.  Continue monitor.  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 Patient is cutting back on cigarettes and is not interested in sleep apnea testing. Can discuss with PCP. Work up previously revealed slightly elevated EPO, JAK2, V617F, exons 12-15, CALR, and MPL were negative. Carbon monoxide 11.8% (elevated). Continue to monitor.   # smoking: Discussed with the patient regarding the ill effects of smoking- including but not limited to cardiac lung and vascular diseases and malignancies. Counseled against smoking.  Discussed regarding lung cancer screening program at length; which includes low-dose CT scan on annual basis for 5 years.  Patient last lung cancer screening CT-2021.  Will send a referral to lung cancer screening program.    # DISPOSITION: # Lung cancer screening program re: Hx of smoking # No venofer; B12 today-  # RTC in 6 months- MD; labs- 1 -2 days prior-  for labs [cbc, cmp, ferritin, iron studies, folate, b12],  venofer +/- b12- Dr.B   Follow-up instructions:  I discussed the assessment and treatment plan with the patient.  The patient was provided an opportunity to ask questions and all were answered.  The patient agreed with the plan and demonstrated understanding of instructions.  The patient was advised to call back or seek an in person evaluation if the symptoms worsen or if the condition fails to improve as anticipated.  Dr. Louretta Shorten [facility] 01/17/2023 3:10 PM

## 2023-06-13 ENCOUNTER — Other Ambulatory Visit: Payer: Self-pay | Admitting: Family Medicine

## 2023-06-13 DIAGNOSIS — Z1212 Encounter for screening for malignant neoplasm of rectum: Secondary | ICD-10-CM

## 2023-06-13 DIAGNOSIS — Z1211 Encounter for screening for malignant neoplasm of colon: Secondary | ICD-10-CM

## 2023-07-23 ENCOUNTER — Inpatient Hospital Stay: Payer: 59 | Attending: Internal Medicine

## 2023-07-23 DIAGNOSIS — G4730 Sleep apnea, unspecified: Secondary | ICD-10-CM | POA: Diagnosis not present

## 2023-07-23 DIAGNOSIS — D7589 Other specified diseases of blood and blood-forming organs: Secondary | ICD-10-CM | POA: Insufficient documentation

## 2023-07-23 DIAGNOSIS — Z9884 Bariatric surgery status: Secondary | ICD-10-CM | POA: Diagnosis not present

## 2023-07-23 DIAGNOSIS — E538 Deficiency of other specified B group vitamins: Secondary | ICD-10-CM | POA: Insufficient documentation

## 2023-07-23 DIAGNOSIS — D751 Secondary polycythemia: Secondary | ICD-10-CM | POA: Diagnosis not present

## 2023-07-23 DIAGNOSIS — F17210 Nicotine dependence, cigarettes, uncomplicated: Secondary | ICD-10-CM | POA: Diagnosis not present

## 2023-07-23 DIAGNOSIS — K9589 Other complications of other bariatric procedure: Secondary | ICD-10-CM

## 2023-07-23 DIAGNOSIS — D508 Other iron deficiency anemias: Secondary | ICD-10-CM | POA: Diagnosis present

## 2023-07-23 LAB — IRON AND TIBC
Iron: 55 ug/dL (ref 28–170)
Saturation Ratios: 14 % (ref 10.4–31.8)
TIBC: 400 ug/dL (ref 250–450)
UIBC: 345 ug/dL

## 2023-07-23 LAB — FOLATE: Folate: 11 ng/mL (ref >5.9)

## 2023-07-23 LAB — CBC WITH DIFFERENTIAL (CANCER CENTER ONLY)
Abs Immature Granulocytes: 0.03 10*3/uL (ref 0.00–0.07)
Basophils Absolute: 0 10*3/uL (ref 0.0–0.1)
Basophils Relative: 1 %
Eosinophils Absolute: 0.1 10*3/uL (ref 0.0–0.5)
Eosinophils Relative: 2 %
HCT: 42.6 % (ref 36.0–46.0)
Hemoglobin: 13.8 g/dL (ref 12.0–15.0)
Immature Granulocytes: 1 %
Lymphocytes Relative: 21 %
Lymphs Abs: 1 10*3/uL (ref 0.7–4.0)
MCH: 32.5 pg (ref 26.0–34.0)
MCHC: 32.4 g/dL (ref 30.0–36.0)
MCV: 100.5 fL — ABNORMAL HIGH (ref 80.0–100.0)
Monocytes Absolute: 0.4 10*3/uL (ref 0.1–1.0)
Monocytes Relative: 8 %
Neutro Abs: 3.2 10*3/uL (ref 1.7–7.7)
Neutrophils Relative %: 67 %
Platelet Count: 290 10*3/uL (ref 150–400)
RBC: 4.24 MIL/uL (ref 3.87–5.11)
RDW: 15.2 % (ref 11.5–15.5)
WBC Count: 4.7 10*3/uL (ref 4.0–10.5)
nRBC: 0 % (ref 0.0–0.2)

## 2023-07-23 LAB — FERRITIN: Ferritin: 5 ng/mL — ABNORMAL LOW (ref 11–307)

## 2023-07-23 LAB — VITAMIN B12: Vitamin B-12: 345 pg/mL (ref 180–914)

## 2023-07-25 ENCOUNTER — Inpatient Hospital Stay (HOSPITAL_BASED_OUTPATIENT_CLINIC_OR_DEPARTMENT_OTHER): Payer: 59 | Admitting: Internal Medicine

## 2023-07-25 ENCOUNTER — Encounter: Payer: Self-pay | Admitting: Internal Medicine

## 2023-07-25 ENCOUNTER — Inpatient Hospital Stay: Payer: 59

## 2023-07-25 VITALS — BP 167/87 | HR 86 | Temp 99.2°F | Ht 59.5 in | Wt 136.6 lb

## 2023-07-25 DIAGNOSIS — D508 Other iron deficiency anemias: Secondary | ICD-10-CM

## 2023-07-25 DIAGNOSIS — F17210 Nicotine dependence, cigarettes, uncomplicated: Secondary | ICD-10-CM

## 2023-07-25 DIAGNOSIS — Z9884 Bariatric surgery status: Secondary | ICD-10-CM | POA: Diagnosis not present

## 2023-07-25 DIAGNOSIS — E538 Deficiency of other specified B group vitamins: Secondary | ICD-10-CM

## 2023-07-25 DIAGNOSIS — E611 Iron deficiency: Secondary | ICD-10-CM

## 2023-07-25 MED ORDER — CYANOCOBALAMIN 1000 MCG/ML IJ SOLN: 1000.0000 ug | Freq: Once | INTRAMUSCULAR | Status: AC

## 2023-07-25 NOTE — Progress Notes (Signed)
[facility] CONSULT NOTE  Patient Care Team: Excell Seltzer, MD as PCP - General Earna Coder, MD as Consulting Physician (Internal Medicine) Domingo Madeira, OD (Optometry)  CHIEF COMPLAINTS/PURPOSE OF CONSULTATION: Iron deficiency s/p gastric bypass  #  Iron deficiency s/p gastric bypass/ B12 deficiency-    #  Macrocytosis- mild NO  alcohol/liver disease.    #Secondary erythrocytosis  cigarette smoking and sleep apnea. [ elevated EPO, JAK2, V617F, exons 12-15, CALR, and MPL were negative. Carbon monoxide 11.8% (elevated).].    HISTORY OF PRESENTING ILLNESS: Ambulating independently.  Alone.   Brenda Hawkins 57 y.o.  female patient with a history of gastric bypass; iron deficiency is here for follow-up.  Patient does is not oral iron. She also not on sublingual barimelt as recommended at last time.   Patient has mild fatigue.  Otherwise denies any worsening shortness of breath or cough.   Review of Systems  Constitutional:  Positive for malaise/fatigue. Negative for chills, diaphoresis, fever and weight loss.  HENT:  Negative for nosebleeds and sore throat.   Eyes:  Negative for double vision.  Respiratory:  Negative for cough, hemoptysis, sputum production, shortness of breath and wheezing.   Cardiovascular:  Negative for chest pain, palpitations, orthopnea and leg swelling.  Gastrointestinal:  Negative for abdominal pain, blood in stool, constipation, diarrhea, heartburn, melena, nausea and vomiting.  Genitourinary:  Negative for dysuria, frequency and urgency.  Musculoskeletal:  Negative for back pain and joint pain.  Skin: Negative.  Negative for itching and rash.  Neurological:  Negative for dizziness, tingling, focal weakness, weakness and headaches.  Endo/Heme/Allergies:  Does not bruise/bleed easily.  Psychiatric/Behavioral:  Negative for depression. The patient is not nervous/anxious and does not have insomnia.      MEDICAL HISTORY:  Past  Medical History:  Diagnosis Date   Anemia    in past   Anxiety    no longer   Asthma    seasonal   Diabetes mellitus type II    no longer   GERD (gastroesophageal reflux disease)    no longer   Kidney stone    Obesity    Osteopenia    Sleep apnea    no longer   Symptomatic cholelithiasis 02/05/2019   Urachal cyst 05/2000   Vitamin B12 deficiency    since bariatric surgery    SURGICAL HISTORY: Past Surgical History:  Procedure Laterality Date   CHOLECYSTECTOMY N/A 02/17/2019   Procedure: LAPAROSCOPIC CHOLECYSTECTOMY - DIABETIC;  Surgeon: Ancil Linsey, MD;  Location: [facility] ORS;  Service: General;  Laterality: N/A;   CYSTOSCOPY W/ URETERAL STENT PLACEMENT Left 11/11/2018   Procedure: CYSTOSCOPY WITH STENT REMOVAL;  Surgeon: Riki Altes, MD;  Location: [facility];  Service: Urology;  Laterality: Left;   CYSTOSCOPY WITH HOLMIUM LASER LITHOTRIPSY Left 10/24/2018   Procedure: CYSTOSCOPY WITH HOLMIUM LASER LITHOTRIPSy;  Surgeon: Crista Elliot, MD;  Location: [facility];  Service: Urology;  Laterality: Left;   CYSTOSCOPY WITH STENT PLACEMENT Bilateral 10/24/2018   Procedure: CYSTOSCOPY WITH STENT PLACEMENT;  Surgeon: Crista Elliot, MD;  Location: [facility];  Service: Urology;  Laterality: Bilateral;   CYSTOSCOPY/URETEROSCOPY/HOLMIUM LASER/STENT PLACEMENT Right 11/11/2018   Procedure: CYSTOSCOPY/URETEROSCOPY/HOLMIUM LASER/STENT EXCHANGE;  Surgeon: Riki Altes, MD;  Location: [facility];  Service: Urology;  Laterality: Right;   ESOPHAGOGASTRODUODENOSCOPY (EGD) WITH PROPOFOL N/A 06/01/2018   Procedure: ESOPHAGOGASTRODUODENOSCOPY (EGD) WITH PROPOFOL;  Surgeon: Toney Reil, MD;  Location[facility] END[facility]COPY;  Service: Gastroenterology;  Laterality:  N/A;   GALLBLADDER SURGERY  02/17/2019   GASTRIC BYPASS  03/17/02   REDUCTION MAMMAPLASTY Right 1992   URETEROSCOPY Left 10/24/2018   Procedure: URETEROSCOPY;  Surgeon: Crista Elliot, MD;  Location: [facility] OR[facility];  Service: Urology;   Laterality: Left;    SOCIAL HISTORY: Social History   Socioeconomic History   Marital status: Married    Spouse name: Not on file   Number of children: 1   Years of education: Not on file   Highest education level: Not on file  Occupational History   Occupation: homemaker  Tobacco Use   Smoking status: Every Day    Current packs/day: 1.50    Average packs/day: 1.5 packs/day for 37.0 years (55.5 ttl pk-yrs)    Types: Cigarettes   Smokeless tobacco: Never  Vaping Use   Vaping status: Never Used  Substance and Sexual Activity   Alcohol use: No   Drug use: No   Sexual activity: Yes    Birth control/protection: None, Post-menopausal  Other Topics Concern   Not on file  Social History Narrative   Sporadic with exercise   Social Determinants of Health   Financial Resource Strain: Not on file  Food Insecurity: Not on file  Transportation Needs: Not on file  Physical Activity: Not on file  Stress: Not on file  Social Connections: Not on file  Intimate Partner Violence: Not on file    FAMILY HISTORY: Family History  Problem Relation Age of Onset   Heart disease Mother        valve replacement surgery   Coronary artery disease Father    Breast cancer Neg Hx     ALLERGIES:  is allergic to codeine sulfate, penicillins, adhesive [tape], and latex.  MEDICATIONS:  Current Outpatient Medications  Medication Sig Dispense Refill   albuterol (VENTOLIN HFA) 108 (90 Base) MCG/ACT inhaler Inhale 2 puffs into the lungs every 6 (six) hours as needed for wheezing or shortness of breath. 6.7 g 0   atorvastatin (LIPITOR) 10 MG tablet Take 1 tablet (10 mg total) by mouth daily. 30 tablet 11   esomeprazole (NEXIUM) 40 MG capsule Take 1 capsule (40 mg total) by mouth 2 (two) times daily before a meal. 60 capsule 11   losartan (COZAAR) 50 MG tablet Take 1 tablet (50 mg total) by mouth daily. 30 tablet 11   No current facility-administered medications for this visit.    Facility-Administered Medications Ordered in Other Visits  Medication Dose Route Frequency Provider Last Rate Last Admin   cyanocobalamin (VITAMIN B12) injection 1,000 mcg  1,000 mcg Intramuscular Once Louretta Shorten R, MD       sodium chloride 0.9 % injection 10 mL  10 mL Intracatheter PRN Jeralyn Ruths, MD          .  PHYSICAL EXAMINATION:  Vitals:   07/25/23 1313 07/25/23 1323  BP: (!) 155/78 (!) 167/87  Pulse: 86   Temp: 99.2 F (37.3 C)   SpO2: 95%     Filed Weights   07/25/23 1313  Weight: 136 lb 9.6 oz (62 kg)     Physical Exam Vitals and nursing note reviewed.  HENT:     Head: Normocephalic and atraumatic.     Mouth/Throat:     Pharynx: Oropharynx is clear.  Eyes:     Extraocular Movements: Extraocular movements intact.     Pupils: Pupils are equal, round, and reactive to light.  Cardiovascular:     Rate and Rhythm: Normal rate and regular  rhythm.  Pulmonary:     Comments: Decreased breath sounds bilaterally.  Abdominal:     Palpations: Abdomen is soft.  Musculoskeletal:        General: Normal range of motion.     Cervical back: Normal range of motion.  Skin:    General: Skin is warm.  Neurological:     General: No focal deficit present.     Mental Status: She is alert and oriented to person, place, and time.  Psychiatric:        Behavior: Behavior normal.        Judgment: Judgment normal.      LABORATORY DATA:  I have reviewed the data as listed Lab Results  Component Value Date   WBC 4.7 07/23/2023   HGB 13.8 07/23/2023   HCT 42.6 07/23/2023   MCV 100.5 (H) 07/23/2023   PLT 290 07/23/2023   Recent Labs    01/07/23 0801 01/13/23 1326  NA 143 140  K 3.8 3.7  CL 110 108  CO2 24 20*  GLUCOSE 71 87  BUN 21 21*  CREATININE 0.67 0.69  CALCIUM 9.0 9.1  GFRNONAA  --  >60  PROT 7.1 7.7  ALBUMIN 4.2 4.1  AST 15 18  ALT 8 12  ALKPHOS 58 59  BILITOT 0.2 0.2*    RADIOGRAPHIC STUDIES: I have personally reviewed the  radiological images as listed and agreed with the findings in the report. No results found.  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 Not anemic today. Hemoglobin 13 . No venofer today; ferritin is 5.  Patient asymptomatic.  Declines Bari melt over-the-counter.  Patient will call if symptomatic for iron infusion.   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 Continue to monitor.   # DISPOSITION: # No venofer/ B12 today- # RTC in 6 months- MD; labs- 1 -2 days prior-  for labs [cbc, cmp, ferritin, iron studies, folate, b12],  venofer +/- b12- Dr.B   All questions were answered. The patient knows to call the clinic with any problems, questions or concerns.    Earna Coder, MD 07/25/2023 1:45 PM

## 2023-07-25 NOTE — Assessment & Plan Note (Addendum)
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 Not anemic today. Hemoglobin 13 . No venofer today; ferritin is 5.  Patient asymptomatic.  Declines Bari melt over-the-counter.  Patient will call if symptomatic for iron infusion.   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 Continue to monitor.   # DISPOSITION: # No venofer/ B12 today- # RTC in 6 months- MD; labs- 1 -2 days prior-  for labs [cbc, cmp, ferritin, iron studies, folate, b12],  venofer +/- b12- Dr.B

## 2023-07-25 NOTE — Progress Notes (Signed)
Fatigue/weakness: yes Dyspena: no Light headedness: no Blood in stool: no  

## 2023-10-13 ENCOUNTER — Telehealth: Payer: Self-pay

## 2023-10-13 NOTE — Telephone Encounter (Signed)
Spoke to pt, scheduled f/u for 10/22/23

## 2023-10-13 NOTE — Telephone Encounter (Signed)
Please call patient and set up office visit for DM follow up.

## 2023-10-22 ENCOUNTER — Ambulatory Visit: Payer: 59 | Admitting: Family Medicine

## 2023-10-22 ENCOUNTER — Encounter: Payer: Self-pay | Admitting: Family Medicine

## 2023-10-22 VITALS — BP 144/82 | HR 77 | Temp 98.6°F | Ht 59.5 in | Wt 138.4 lb

## 2023-10-22 DIAGNOSIS — E785 Hyperlipidemia, unspecified: Secondary | ICD-10-CM

## 2023-10-22 DIAGNOSIS — E119 Type 2 diabetes mellitus without complications: Secondary | ICD-10-CM

## 2023-10-22 DIAGNOSIS — E1159 Type 2 diabetes mellitus with other circulatory complications: Secondary | ICD-10-CM | POA: Diagnosis not present

## 2023-10-22 DIAGNOSIS — E1169 Type 2 diabetes mellitus with other specified complication: Secondary | ICD-10-CM | POA: Diagnosis not present

## 2023-10-22 DIAGNOSIS — Z789 Other specified health status: Secondary | ICD-10-CM | POA: Insufficient documentation

## 2023-10-22 DIAGNOSIS — I152 Hypertension secondary to endocrine disorders: Secondary | ICD-10-CM

## 2023-10-22 DIAGNOSIS — I700 Atherosclerosis of aorta: Secondary | ICD-10-CM

## 2023-10-22 LAB — HM DIABETES FOOT EXAM

## 2023-10-22 LAB — POCT GLYCOSYLATED HEMOGLOBIN (HGB A1C): Hemoglobin A1C: 6 % — AB (ref 4.0–5.6)

## 2023-10-22 MED ORDER — LOSARTAN POTASSIUM 25 MG PO TABS: 25.0000 mg | ORAL_TABLET | Freq: Every day | ORAL | Status: DC

## 2023-10-22 NOTE — Patient Instructions (Addendum)
Okay to remain off atorvastatin.  Restart low dose 25 mg losartan... call if stomach upset.  Follow BP at home.. call if not goal < 140/90.

## 2023-10-22 NOTE — Assessment & Plan Note (Signed)
Stomach upset with atorvastatin... can consider retrying in future or try different statin given indications of aortic atherosclerosis and DM.

## 2023-10-22 NOTE — Assessment & Plan Note (Addendum)
Due for re-eval  at [facility]  in 3 months.  Chronic,  LDL NOT at goal less than 70 given aortic atherosclerosis.   Stopped atorvastatin 10 mg p.o. daily given upset stomach.   She will continue to work on low cholesterol diet and  restart exercise.

## 2023-10-22 NOTE — Assessment & Plan Note (Signed)
Chronic, inadequate control of blood pressure in office today as well as at home per patient. She feels that she started having stomach upset when started statin medication on top of the losartan.  She has stopped both of them. We discussed in detail the need for blood pressure control to protect the heart and kidneys.  She is willing to restart losartan but at a lower dose 25 mg daily.  She will follow blood pressure at home and call if it is not at goal less than 140/90.

## 2023-10-22 NOTE — Progress Notes (Signed)
Patient ID: Brenda Hawkins, female    DOB: [birth year], 57 y.o.   MRN: [id number]  This visit was conducted in person.  BP (!) 144/82   Pulse 77   Temp 98.6 F (37 C) (Oral)   Ht 4' 11.5" (1.511 m)   Wt 138 lb 6.4 oz (62.8 kg)   SpO2 95%   BMI 27.49 kg/m    CC:  Chief Complaint  Patient presents with   Diabetes    Follow up     Subjective:   HPI: Brenda Hawkins is a 58 y.o. female presenting on 10/22/2023 for Diabetes (Follow up )   Hx of gastric bypass.  Diabetes: Improved control with die t and weight management  Cooking more at home, more veggies. Lab Results  Component Value Date   HGBA1C 6.0 (A) 10/22/2023  Using medications without difficulties: Hypoglycemic episodes: Hyperglycemic episodes: Feet problems: no ulcers Blood Sugars averaging: not checking eye exam within last year: yes Normal microalbumin/creatinine  Wt Readings from Last 3 Encounters:  10/22/23 138 lb 6.4 oz (62.8 kg)  07/25/23 136 lb 9.6 oz (62 kg)  01/17/23 132 lb 9.6 oz (60.1 kg)   Body mass index is 27.49 kg/m.  Elevated Cholesterol:  She has stopped atorvastatin Lab Results  Component Value Date   CHOL 175 01/07/2023   HDL 36.80 (L) 01/07/2023   LDLCALC 101 (H) 01/07/2023   LDLDIRECT 87.0 12/10/2021   TRIG 184.0 (H) 01/07/2023   CHOLHDL 5 01/07/2023  Using medications without problems: none Muscle aches: none Diet compliance:  good Exercise:  walking some Other complaints:  Hypertension:  Blood pressure  NOT at goal.   Stopped losartan ... Felt could cause upset stomach. Plans in the works and BP Readings from Last 3 Encounters:  10/22/23 (!) 144/82  07/25/23 (!) 167/87  01/17/23 (!) 168/86  Using medication without problems or lightheadedness: none  Chest pain with exertion: non Edema: none Short of breath: none Average home BPs: not checking. Other issues:  Wt Readings from Last 3 Encounters:  10/22/23 138 lb 6.4 oz (62.8 kg)  07/25/23 136 lb 9.6 oz (62  kg)  01/17/23 132 lb 9.6 oz (60.1 kg)     Relevant past medical, surgical, family and social history reviewed and updated as indicated. Interim medical history since our last visit reviewed. Allergies and medications reviewed and updated. Outpatient Medications Prior to Visit  Medication Sig Dispense Refill   albuterol (VENTOLIN HFA) 108 (90 Base) MCG/ACT inhaler Inhale 2 puffs into the lungs every 6 (six) hours as needed for wheezing or shortness of breath. 6.7 g 0   esomeprazole (NEXIUM) 40 MG capsule Take 1 capsule (40 mg total) by mouth 2 (two) times daily before a meal. 60 capsule 11   atorvastatin (LIPITOR) 10 MG tablet Take 1 tablet (10 mg total) by mouth daily. (Patient taking differently: Take 10 mg by mouth daily. Patient not taking daily) 30 tablet 11   losartan (COZAAR) 50 MG tablet Take 1 tablet (50 mg total) by mouth daily. (Patient taking differently: Take 50 mg by mouth daily. Patient not taking daily) 30 tablet 11   Facility-Administered Medications Prior to Visit  Medication Dose Route Frequency Provider Last Rate Last Admin   cyanocobalamin (VITAMIN B12) injection 1,000 mcg  1,000 mcg Intramuscular Once Louretta Shorten R, MD       sodium chloride 0.9 % injection 10 mL  10 mL Intracatheter PRN Jeralyn Ruths, MD  Per HPI unless specifically indicated in ROS section below Review of Systems  Constitutional:  Negative for fatigue and fever.  HENT:  Negative for ear pain.   Eyes:  Negative for pain.  Respiratory:  Negative for chest tightness and shortness of breath.   Cardiovascular:  Negative for chest pain, palpitations and leg swelling.  Gastrointestinal:  Negative for abdominal pain.  Genitourinary:  Negative for dysuria.   Objective:  BP (!) 144/82   Pulse 77   Temp 98.6 F (37 C) (Oral)   Ht 4' 11.5" (1.511 m)   Wt 138 lb 6.4 oz (62.8 kg)   SpO2 95%   BMI 27.49 kg/m   Wt Readings from Last 3 Encounters:  10/22/23 138 lb 6.4 oz (62.8 kg)   07/25/23 136 lb 9.6 oz (62 kg)  01/17/23 132 lb 9.6 oz (60.1 kg)      Physical Exam Constitutional:      General: She is not in acute distress.    Appearance: Normal appearance. She is well-developed. She is not ill-appearing or toxic-appearing.  HENT:     Head: Normocephalic.     Right Ear: Hearing, tympanic membrane, ear canal and external ear normal. Tympanic membrane is not erythematous, retracted or bulging.     Left Ear: Hearing, tympanic membrane, ear canal and external ear normal. Tympanic membrane is not erythematous, retracted or bulging.     Nose: No mucosal edema or rhinorrhea.     Right Sinus: No maxillary sinus tenderness or frontal sinus tenderness.     Left Sinus: No maxillary sinus tenderness or frontal sinus tenderness.     Mouth/Throat:     Pharynx: Uvula midline.  Eyes:     General: Lids are normal. Lids are everted, no foreign bodies appreciated.     Conjunctiva/sclera: Conjunctivae normal.     Pupils: Pupils are equal, round, and reactive to light.  Neck:     Thyroid: No thyroid mass or thyromegaly.     Vascular: No carotid bruit.     Trachea: Trachea normal.  Cardiovascular:     Rate and Rhythm: Normal rate and regular rhythm.     Pulses: Normal pulses.     Heart sounds: Normal heart sounds, S1 normal and S2 normal. No murmur heard.    No friction rub. No gallop.  Pulmonary:     Effort: Pulmonary effort is normal. No tachypnea or respiratory distress.     Breath sounds: Normal breath sounds. No decreased breath sounds, wheezing, rhonchi or rales.  Abdominal:     General: Bowel sounds are normal.     Palpations: Abdomen is soft.     Tenderness: There is no abdominal tenderness.  Musculoskeletal:     Cervical back: Normal range of motion and neck supple.  Skin:    General: Skin is warm and dry.     Findings: No rash.  Neurological:     Mental Status: She is alert.     Cranial Nerves: Cranial nerves 2-12 are intact.     Sensory: Sensory deficit  present.     Motor: Motor function is intact.     Coordination: Coordination is intact.     Comments: Left lower leg with numbness  Psychiatric:        Mood and Affect: Mood is not anxious or depressed.        Speech: Speech normal.        Behavior: Behavior normal. Behavior is cooperative.        Thought Content: Thought  content normal.        Judgment: Judgment normal.       Diabetic foot exam: Normal inspection No skin breakdown No calluses  Decreasedl DP pulses Normal sensation to light touch and monofilament Nails normal  Results for orders placed or performed in visit on 10/22/23  HM DIABETES FOOT EXAM   Collection Time: 10/22/23 12:00 AM  Result Value Ref Range   [facility] Diabetic Foot Exam done   POCT glycosylated hemoglobin (Hb A1C)   Collection Time: 10/22/23 11:42 AM  Result Value Ref Range   Hemoglobin A1C 6.0 (A) 4.0 - 5.6 %   HbA1c POC (<> result, manual entry)     HbA1c, POC (prediabetic range)     HbA1c, POC (controlled diabetic range)       COVID 19 screen:  No recent travel or known exposure to COVID19 The patient denies respiratory symptoms of COVID 19 at this time. The importance of social distancing was discussed today.   Assessment and Plan    Problem List Items Addressed This Visit     Aortic atherosclerosis (HCC)   Relevant Medications   losartan (COZAAR) 25 MG tablet   Controlled type 2 diabetes mellitus without complication, without long-term current use of insulin (HCC) - Primary   Chronic, significant improvement in A1c with lifestyle change and weight management. Reevaluate in 3 months.      Relevant Medications   losartan (COZAAR) 25 MG tablet   Other Relevant Orders   POCT glycosylated hemoglobin (Hb A1C) (Completed)   Hyperlipidemia associated with type 2 diabetes mellitus (HCC)    Due for re-eval  at [facility]  in 3 months.  Chronic,  LDL NOT at goal less than 70 given aortic atherosclerosis.   Stopped atorvastatin 10 mg p.o. daily  given upset stomach.   She will continue to work on low cholesterol diet and  restart exercise.      Relevant Medications   losartan (COZAAR) 25 MG tablet   Hypertension associated with diabetes (HCC)   Chronic, inadequate control of blood pressure in office today as well as at home per patient. She feels that she started having stomach upset when started statin medication on top of the losartan.  She has stopped both of them. We discussed in detail the need for blood pressure control to protect the heart and kidneys.  She is willing to restart losartan but at a lower dose 25 mg daily.  She will follow blood pressure at home and call if it is not at goal less than 140/90.      Relevant Medications   losartan (COZAAR) 25 MG tablet   Statin intolerance    Stomach upset with atorvastatin... can consider retrying in future or try different statin given indications of aortic atherosclerosis and DM.       Kerby Nora, MD

## 2023-10-22 NOTE — Assessment & Plan Note (Addendum)
Chronic, significant improvement in A1c with lifestyle change and weight management. Reevaluate in 3 months.

## 2023-11-25 ENCOUNTER — Telehealth: Payer: Self-pay | Admitting: Family Medicine

## 2023-11-25 NOTE — Telephone Encounter (Signed)
 Mychart

## 2023-12-29 ENCOUNTER — Telehealth: Payer: Self-pay | Admitting: *Deleted

## 2023-12-29 DIAGNOSIS — E611 Iron deficiency: Secondary | ICD-10-CM

## 2023-12-29 DIAGNOSIS — R0989 Other specified symptoms and signs involving the circulatory and respiratory systems: Secondary | ICD-10-CM

## 2023-12-29 DIAGNOSIS — E119 Type 2 diabetes mellitus without complications: Secondary | ICD-10-CM

## 2023-12-29 DIAGNOSIS — M5442 Lumbago with sciatica, left side: Secondary | ICD-10-CM

## 2023-12-29 DIAGNOSIS — D508 Other iron deficiency anemias: Secondary | ICD-10-CM

## 2023-12-29 DIAGNOSIS — K9589 Other complications of other bariatric procedure: Secondary | ICD-10-CM

## 2023-12-29 DIAGNOSIS — E538 Deficiency of other specified B group vitamins: Secondary | ICD-10-CM

## 2023-12-29 NOTE — Telephone Encounter (Signed)
-----   Message from Gerry Krone sent at 12/25/2023  3:17 PM EDT ----- Regarding: Lab orders for Tue, 5.6.25 Patient is scheduled for [facility] labs, please order future labs, Thanks , Anselmo Kings

## 2024-01-13 ENCOUNTER — Encounter: Payer: Self-pay | Admitting: Family Medicine

## 2024-01-13 ENCOUNTER — Other Ambulatory Visit (INDEPENDENT_AMBULATORY_CARE_PROVIDER_SITE_OTHER): Payer: 59

## 2024-01-13 DIAGNOSIS — E119 Type 2 diabetes mellitus without complications: Secondary | ICD-10-CM

## 2024-01-13 DIAGNOSIS — R0989 Other specified symptoms and signs involving the circulatory and respiratory systems: Secondary | ICD-10-CM | POA: Diagnosis not present

## 2024-01-13 DIAGNOSIS — D508 Other iron deficiency anemias: Secondary | ICD-10-CM

## 2024-01-13 DIAGNOSIS — M5442 Lumbago with sciatica, left side: Secondary | ICD-10-CM | POA: Diagnosis not present

## 2024-01-13 DIAGNOSIS — E538 Deficiency of other specified B group vitamins: Secondary | ICD-10-CM

## 2024-01-13 DIAGNOSIS — K9589 Other complications of other bariatric procedure: Secondary | ICD-10-CM | POA: Diagnosis not present

## 2024-01-13 DIAGNOSIS — E611 Iron deficiency: Secondary | ICD-10-CM | POA: Diagnosis not present

## 2024-01-13 LAB — LIPID PANEL
Cholesterol: 254 mg/dL — ABNORMAL HIGH (ref 0–200)
HDL: 36.4 mg/dL — ABNORMAL LOW (ref >39.00)
LDL Cholesterol: 183 mg/dL — ABNORMAL HIGH (ref 0–99)
NonHDL: 217.62
Total CHOL/HDL Ratio: 7
Triglycerides: 173 mg/dL — ABNORMAL HIGH (ref 0.0–149.0)
VLDL: 34.6 mg/dL (ref 0.0–40.0)

## 2024-01-13 LAB — COMPREHENSIVE METABOLIC PANEL WITH GFR
ALT: 12 U/L (ref 0–35)
AST: 18 U/L (ref 0–37)
Albumin: 4.4 g/dL (ref 3.5–5.2)
Alkaline Phosphatase: 66 U/L (ref 39–117)
BUN: 21 mg/dL (ref 6–23)
CO2: 23 meq/L (ref 19–32)
Calcium: 9.1 mg/dL (ref 8.4–10.5)
Chloride: 107 meq/L (ref 96–112)
Creatinine, Ser: 0.65 mg/dL (ref 0.40–1.20)
GFR: 96.53 mL/min (ref >60.00)
Glucose, Bld: 81 mg/dL (ref 70–99)
Potassium: 4 meq/L (ref 3.5–5.1)
Sodium: 141 meq/L (ref 135–145)
Total Bilirubin: 0.2 mg/dL (ref 0.2–1.2)
Total Protein: 7.8 g/dL (ref 6.0–8.3)

## 2024-01-13 LAB — CBC WITH DIFFERENTIAL/PLATELET
Basophils Absolute: 0 10*3/uL (ref 0.0–0.1)
Basophils Relative: 0.6 % (ref 0.0–3.0)
Eosinophils Absolute: 0.1 10*3/uL (ref 0.0–0.7)
Eosinophils Relative: 1.8 % (ref 0.0–5.0)
HCT: 41.1 % (ref 36.0–46.0)
Hemoglobin: 13.3 g/dL (ref 12.0–15.0)
Lymphocytes Relative: 20.5 % (ref 12.0–46.0)
Lymphs Abs: 1 10*3/uL (ref 0.7–4.0)
MCHC: 32.4 g/dL (ref 30.0–36.0)
MCV: 92.6 fl (ref 78.0–100.0)
Monocytes Absolute: 0.3 10*3/uL (ref 0.1–1.0)
Monocytes Relative: 6.8 % (ref 3.0–12.0)
Neutro Abs: 3.5 10*3/uL (ref 1.4–7.7)
Neutrophils Relative %: 70.3 % (ref 43.0–77.0)
Platelets: 348 10*3/uL (ref 150.0–400.0)
RBC: 4.44 Mil/uL (ref 3.87–5.11)
RDW: 19.9 % — ABNORMAL HIGH (ref 11.5–15.5)
WBC: 5 10*3/uL (ref 4.0–10.5)

## 2024-01-13 LAB — MICROALBUMIN / CREATININE URINE RATIO
Creatinine,U: 120.2 mg/dL
Microalb Creat Ratio: 43.7 mg/g — ABNORMAL HIGH (ref 0.0–30.0)
Microalb, Ur: 5.3 mg/dL — ABNORMAL HIGH (ref 0.0–1.9)

## 2024-01-13 LAB — HEMOGLOBIN A1C: Hgb A1c MFr Bld: 7 % — ABNORMAL HIGH (ref 4.6–6.5)

## 2024-01-13 NOTE — Progress Notes (Signed)
 No critical labs need to be addressed urgently. We will discuss labs in detail at upcoming office visit.

## 2024-01-19 ENCOUNTER — Inpatient Hospital Stay: Payer: 59 | Attending: Internal Medicine

## 2024-01-19 DIAGNOSIS — E538 Deficiency of other specified B group vitamins: Secondary | ICD-10-CM | POA: Insufficient documentation

## 2024-01-19 DIAGNOSIS — K9589 Other complications of other bariatric procedure: Secondary | ICD-10-CM

## 2024-01-19 DIAGNOSIS — K909 Intestinal malabsorption, unspecified: Secondary | ICD-10-CM | POA: Insufficient documentation

## 2024-01-19 DIAGNOSIS — Z9884 Bariatric surgery status: Secondary | ICD-10-CM | POA: Insufficient documentation

## 2024-01-19 DIAGNOSIS — D7589 Other specified diseases of blood and blood-forming organs: Secondary | ICD-10-CM | POA: Insufficient documentation

## 2024-01-19 DIAGNOSIS — G4730 Sleep apnea, unspecified: Secondary | ICD-10-CM | POA: Insufficient documentation

## 2024-01-19 DIAGNOSIS — D751 Secondary polycythemia: Secondary | ICD-10-CM | POA: Diagnosis not present

## 2024-01-19 DIAGNOSIS — D508 Other iron deficiency anemias: Secondary | ICD-10-CM | POA: Diagnosis present

## 2024-01-19 DIAGNOSIS — F17210 Nicotine dependence, cigarettes, uncomplicated: Secondary | ICD-10-CM | POA: Diagnosis not present

## 2024-01-19 LAB — CBC WITH DIFFERENTIAL (CANCER CENTER ONLY)
Abs Immature Granulocytes: 0.03 10*3/uL (ref 0.00–0.07)
Basophils Absolute: 0 10*3/uL (ref 0.0–0.1)
Basophils Relative: 1 %
Eosinophils Absolute: 0.1 10*3/uL (ref 0.0–0.5)
Eosinophils Relative: 2 %
HCT: 37.1 % (ref 36.0–46.0)
Hemoglobin: 11.6 g/dL — ABNORMAL LOW (ref 12.0–15.0)
Immature Granulocytes: 1 %
Lymphocytes Relative: 25 %
Lymphs Abs: 1.3 10*3/uL (ref 0.7–4.0)
MCH: 29.5 pg (ref 26.0–34.0)
MCHC: 31.3 g/dL (ref 30.0–36.0)
MCV: 94.4 fL (ref 80.0–100.0)
Monocytes Absolute: 0.4 10*3/uL (ref 0.1–1.0)
Monocytes Relative: 8 %
Neutro Abs: 3.3 10*3/uL (ref 1.7–7.7)
Neutrophils Relative %: 63 %
Platelet Count: 277 10*3/uL (ref 150–400)
RBC: 3.93 MIL/uL (ref 3.87–5.11)
RDW: 19 % — ABNORMAL HIGH (ref 11.5–15.5)
WBC Count: 5.2 10*3/uL (ref 4.0–10.5)
nRBC: 0 % (ref 0.0–0.2)

## 2024-01-19 LAB — IRON AND TIBC
Iron: 41 ug/dL (ref 28–170)
Saturation Ratios: 11 % (ref 10.4–31.8)
TIBC: 388 ug/dL (ref 250–450)
UIBC: 347 ug/dL

## 2024-01-19 LAB — CMP (CANCER CENTER ONLY)
ALT: 17 U/L (ref 0–44)
AST: 19 U/L (ref 15–41)
Albumin: 3.7 g/dL (ref 3.5–5.0)
Alkaline Phosphatase: 60 U/L (ref 38–126)
Anion gap: 11 (ref 5–15)
BUN: 23 mg/dL — ABNORMAL HIGH (ref 6–20)
CO2: 21 mmol/L — ABNORMAL LOW (ref 22–32)
Calcium: 8.5 mg/dL — ABNORMAL LOW (ref 8.9–10.3)
Chloride: 109 mmol/L (ref 98–111)
Creatinine: 0.71 mg/dL (ref 0.44–1.00)
GFR, Estimated: >60 mL/min (ref >60)
Glucose, Bld: 89 mg/dL (ref 70–99)
Potassium: 3.7 mmol/L (ref 3.5–5.1)
Sodium: 141 mmol/L (ref 135–145)
Total Bilirubin: 0.3 mg/dL (ref 0.0–1.2)
Total Protein: 7.2 g/dL (ref 6.5–8.1)

## 2024-01-19 LAB — VITAMIN B12: Vitamin B-12: 282 pg/mL (ref 180–914)

## 2024-01-19 LAB — FOLATE: Folate: 9 ng/mL (ref >5.9)

## 2024-01-19 LAB — FERRITIN: Ferritin: 4 ng/mL — ABNORMAL LOW (ref 11–307)

## 2024-01-20 ENCOUNTER — Ambulatory Visit (INDEPENDENT_AMBULATORY_CARE_PROVIDER_SITE_OTHER): Payer: 59 | Admitting: Family Medicine

## 2024-01-20 ENCOUNTER — Encounter: Payer: Self-pay | Admitting: Family Medicine

## 2024-01-20 VITALS — BP 160/80 | HR 77 | Temp 98.2°F | Ht 60.25 in | Wt 136.2 lb

## 2024-01-20 DIAGNOSIS — J4520 Mild intermittent asthma, uncomplicated: Secondary | ICD-10-CM

## 2024-01-20 DIAGNOSIS — E119 Type 2 diabetes mellitus without complications: Secondary | ICD-10-CM

## 2024-01-20 DIAGNOSIS — K9589 Other complications of other bariatric procedure: Secondary | ICD-10-CM

## 2024-01-20 DIAGNOSIS — E785 Hyperlipidemia, unspecified: Secondary | ICD-10-CM

## 2024-01-20 DIAGNOSIS — I700 Atherosclerosis of aorta: Secondary | ICD-10-CM

## 2024-01-20 DIAGNOSIS — Z720 Tobacco use: Secondary | ICD-10-CM

## 2024-01-20 DIAGNOSIS — D508 Other iron deficiency anemias: Secondary | ICD-10-CM

## 2024-01-20 DIAGNOSIS — E1159 Type 2 diabetes mellitus with other circulatory complications: Secondary | ICD-10-CM

## 2024-01-20 DIAGNOSIS — E1169 Type 2 diabetes mellitus with other specified complication: Secondary | ICD-10-CM | POA: Diagnosis not present

## 2024-01-20 DIAGNOSIS — Z789 Other specified health status: Secondary | ICD-10-CM

## 2024-01-20 DIAGNOSIS — Z0000 Encounter for general adult medical examination without abnormal findings: Secondary | ICD-10-CM | POA: Diagnosis not present

## 2024-01-20 DIAGNOSIS — R809 Proteinuria, unspecified: Secondary | ICD-10-CM

## 2024-01-20 DIAGNOSIS — I152 Hypertension secondary to endocrine disorders: Secondary | ICD-10-CM

## 2024-01-20 MED ORDER — LOSARTAN POTASSIUM 50 MG PO TABS: 50.0000 mg | ORAL_TABLET | Freq: Every day | ORAL | Status: DC

## 2024-01-20 MED ORDER — LOSARTAN POTASSIUM 25 MG PO TABS: 50.0000 mg | ORAL_TABLET | Freq: Every day | ORAL | Status: DC

## 2024-01-20 MED ORDER — LOSARTAN POTASSIUM 50 MG PO TABS: 50.0000 mg | ORAL_TABLET | Freq: Every day | ORAL | 11 refills | Status: AC

## 2024-01-20 MED ORDER — NYSTATIN 100000 UNIT/GM EX OINT: 1.0000 | TOPICAL_OINTMENT | Freq: Two times a day (BID) | CUTANEOUS | 0 refills | Status: AC

## 2024-01-20 NOTE — Patient Instructions (Addendum)
 Increase losartan  to 50 mg daily.Aaron Aas goal BP < 140/90.

## 2024-01-20 NOTE — Assessment & Plan Note (Signed)
 Atorvastatin  low dose cause stomach upset.

## 2024-01-20 NOTE — Progress Notes (Signed)
 Patient ID: Brenda Hawkins, female    DOB: May 31, 1966, 58 y.o.   MRN: [id number]  This visit was conducted in person.  BP (!) 160/80   Pulse 77   Temp 98.2 F (36.8 C) (Temporal)   Ht 5' 0.25" (1.53 m)   Wt 136 lb 4 oz (61.8 kg)   SpO2 96%   BMI 26.39 kg/m    CC:  Chief Complaint  Patient presents with   Annual Exam    Subjective:   HPI: Brenda Hawkins is a 58 y.o. female presenting on 01/20/2024 for Annual Exam  Red rash under right pannus .Aaron Aas Falres up off and on and needs refill  Diabetes: Diet controlled Lab Results  Component Value Date   HGBA1C 7.0 (H) 01/13/2024  Using medications without difficulties: Hypoglycemic episodes: none Hyperglycemic episodes: none Feet problems: none Blood Sugars averaging:  not checking  eye exam within last year:  Hypertension:   White coat HTN but still remaining high at home.  On losartan  25 mg daily BP Readings from Last 3 Encounters:  01/20/24 (!) 160/80  10/22/23 (!) 144/82  07/25/23 (!) 167/87  Using medication without problems or lightheadedness:  occ Chest pain with exertion:none Edema:none Short of breath: none Average home BPs: 155/80 Other issues:  Elevated Cholesterol: Not on cholesterol medication.  Side effects to atorvastatin . Lab Results  Component Value Date   CHOL 254 (H) 01/13/2024   HDL 36.40 (L) 01/13/2024   LDLCALC 183 (H) 01/13/2024   LDLDIRECT 87.0 12/10/2021   TRIG 173.0 (H) 01/13/2024   CHOLHDL 7 01/13/2024  Using medications without problems: Muscle aches:  none Diet compliance: health healthy diet. Exercise: occ Other complaints:    Wt Readings from Last 3 Encounters:  01/20/24 136 lb 4 oz (61.8 kg)  10/22/23 138 lb 6.4 oz (62.8 kg)  07/25/23 136 lb 9.6 oz (62 kg)     Iron  deficiency anemia and polycythemia followed by Dr. Harper Lily, last OV note reviewed, next OV next week. Scheduled for monthly B12 injections. Has upcoming cbc and B12 check tommorow.   GERD, S/P gastric  bypass, hx/o PUD: Stable on nexium , has tried to stop but cannot without return of symptoms. NSAIDs contraindicated. Poor absorption.. needs B12 and vit d to be followed.    Asthma, mild intermittent: stable control  Using albuterol  prn. No recent flares  Current smoker, precontemplative to quit smoking.          Relevant past medical, surgical, family and social history reviewed and updated as indicated. Interim medical history since our last visit reviewed. Allergies and medications reviewed and updated. Outpatient Medications Prior to Visit  Medication Sig Dispense Refill   albuterol  (VENTOLIN  HFA) 108 (90 Base) MCG/ACT inhaler Inhale 2 puffs into the lungs every 6 (six) hours as needed for wheezing or shortness of breath. 6.7 g 0   esomeprazole  (NEXIUM ) 40 MG capsule Take 1 capsule (40 mg total) by mouth 2 (two) times daily before a meal. 60 capsule 11   losartan  (COZAAR ) 25 MG tablet Take 1 tablet (25 mg total) by mouth daily.     Facility-Administered Medications Prior to Visit  Medication Dose Route Frequency Provider Last Rate Last Admin   cyanocobalamin  (VITAMIN B12) injection 1,000 mcg  1,000 mcg Intramuscular Once Brahmanday, Govinda R, MD       sodium chloride  0.9 % injection 10 mL  10 mL Intracatheter PRN Finnegan, Timothy J, MD         Per HPI unless  specifically indicated in ROS section below Review of Systems  Constitutional:  Negative for fatigue and fever.  HENT:  Negative for congestion and ear pain.   Eyes:  Negative for pain.  Respiratory:  Negative for cough, chest tightness and shortness of breath.   Cardiovascular:  Negative for chest pain, palpitations and leg swelling.  Gastrointestinal:  Negative for abdominal pain.  Genitourinary:  Negative for dysuria and vaginal bleeding.  Musculoskeletal:  Negative for back pain.  Neurological:  Negative for syncope, light-headedness and headaches.  Psychiatric/Behavioral:  Negative for dysphoric mood.     Objective:  BP (!) 160/80   Pulse 77   Temp 98.2 F (36.8 C) (Temporal)   Ht 5' 0.25" (1.53 m)   Wt 136 lb 4 oz (61.8 kg)   SpO2 96%   BMI 26.39 kg/m   Wt Readings from Last 3 Encounters:  01/20/24 136 lb 4 oz (61.8 kg)  10/22/23 138 lb 6.4 oz (62.8 kg)  07/25/23 136 lb 9.6 oz (62 kg)      Physical Exam Vitals and nursing note reviewed.  Constitutional:      General: She is not in acute distress.    Appearance: Normal appearance. She is well-developed. She is not ill-appearing or toxic-appearing.     Comments: Central obesity  HENT:     Head: Normocephalic.     Right Ear: Hearing, tympanic membrane, ear canal and external ear normal.     Left Ear: Hearing, tympanic membrane, ear canal and external ear normal.     Nose: Nose normal.  Eyes:     General: Lids are normal. Lids are everted, no foreign bodies appreciated.     Conjunctiva/sclera: Conjunctivae normal.     Pupils: Pupils are equal, round, and reactive to light.  Neck:     Thyroid : No thyroid  mass or thyromegaly.     Vascular: No carotid bruit.     Trachea: Trachea normal.  Cardiovascular:     Rate and Rhythm: Normal rate and regular rhythm.     Pulses:          Dorsalis pedis pulses are 1+ on the right side.       Posterior tibial pulses are 1+ on the right side.     Heart sounds: Normal heart sounds, S1 normal and S2 normal. No murmur heard.    No gallop.  Pulmonary:     Effort: Pulmonary effort is normal. No respiratory distress.     Breath sounds: Normal breath sounds. No wheezing, rhonchi or rales.  Abdominal:     General: Bowel sounds are normal. There is no distension or abdominal bruit.     Palpations: Abdomen is soft. There is no fluid wave or mass.     Tenderness: There is no abdominal tenderness. There is no guarding or rebound.     Hernia: No hernia is present.  Musculoskeletal:     Cervical back: Normal range of motion and neck supple.  Lymphadenopathy:     Cervical: No cervical adenopathy.   Skin:    General: Skin is warm and dry.     Findings: No rash.  Neurological:     Mental Status: She is alert.     Cranial Nerves: No cranial nerve deficit.     Sensory: No sensory deficit.  Psychiatric:        Mood and Affect: Mood is not anxious or depressed.        Speech: Speech normal.        Behavior:  Behavior normal. Behavior is cooperative.        Judgment: Judgment normal.       Results for orders placed or performed in visit on 01/19/24  Folate   Collection Time: 01/19/24  8:37 AM  Result Value Ref Range   Folate 9.0 >5.9 ng/mL  Vitamin B12   Collection Time: 01/19/24  8:37 AM  Result Value Ref Range   Vitamin B-12 282 180 - 914 pg/mL  Ferritin   Collection Time: 01/19/24  8:37 AM  Result Value Ref Range   Ferritin 4 (L) 11 - 307 ng/mL  Iron  and TIBC   Collection Time: 01/19/24  8:37 AM  Result Value Ref Range   Iron  41 28 - 170 ug/dL   TIBC 811 914 - 782 ug/dL   Saturation Ratios 11 10.4 - 31.8 %   UIBC 347 ug/dL  CBC with Differential (Cancer Center Only)   Collection Time: 01/19/24  8:37 AM  Result Value Ref Range   WBC Count 5.2 4.0 - 10.5 K/uL   RBC 3.93 3.87 - 5.11 MIL/uL   Hemoglobin 11.6 (L) 12.0 - 15.0 g/dL   HCT 95.6 21.3 - 08.6 %   MCV 94.4 80.0 - 100.0 fL   MCH 29.5 26.0 - 34.0 pg   MCHC 31.3 30.0 - 36.0 g/dL   RDW 57.8 (H) 46.9 - 62.9 %   Platelet Count 277 150 - 400 K/uL   nRBC 0.0 0.0 - 0.2 %   Neutrophils Relative % 63 %   Neutro Abs 3.3 1.7 - 7.7 K/uL   Lymphocytes Relative 25 %   Lymphs Abs 1.3 0.7 - 4.0 K/uL   Monocytes Relative 8 %   Monocytes Absolute 0.4 0.1 - 1.0 K/uL   Eosinophils Relative 2 %   Eosinophils Absolute 0.1 0.0 - 0.5 K/uL   Basophils Relative 1 %   Basophils Absolute 0.0 0.0 - 0.1 K/uL   Immature Granulocytes 1 %   Abs Immature Granulocytes 0.03 0.00 - 0.07 K/uL  CMP (Cancer Center only)   Collection Time: 01/19/24  8:37 AM  Result Value Ref Range   Sodium 141 135 - 145 mmol/L   Potassium 3.7 3.5 - 5.1  mmol/L   Chloride 109 98 - 111 mmol/L   CO2 21 (L) 22 - 32 mmol/L   Glucose, Bld 89 70 - 99 mg/dL   BUN 23 (H) 6 - 20 mg/dL   Creatinine 5.28 4.13 - 1.00 mg/dL   Calcium  8.5 (L) 8.9 - 10.3 mg/dL   Total Protein 7.2 6.5 - 8.1 g/dL   Albumin 3.7 3.5 - 5.0 g/dL   AST 19 15 - 41 U/L   ALT 17 0 - 44 U/L   Alkaline Phosphatase 60 38 - 126 U/L   Total Bilirubin 0.3 0.0 - 1.2 mg/dL   GFR, Estimated >24 >40 mL/min   Anion gap 11 5 - 15     COVID 19 screen:  No recent travel or known exposure to COVID19 The patient denies respiratory symptoms of COVID 19 at this time. The importance of social distancing was discussed today.   Assessment and Plan The patient's preventative maintenance and recommended screening tests for an annual wellness exam were reviewed in full today. Brought up to date unless services declined.  Counselled on the importance of diet, exercise, and its role in overall health and mortality. The patient's FH and SH was reviewed, including their home life, tobacco status, and drug and alcohol status.  2025 Patient again refused all below noted preventative recommendations despite need. This was risk and benefits reviewed in detail with patient. She voiced understanding. Vaccines: refused flu, PNA, TDap and COVID vaccine despite need.. discussed in detail. Pap/DVE:  refused Mammo: refused Bone Density:refused Colon: refused  Smoking Status: current, refused discussion of cessation ETOH/ drug use: none/none  Hep C:  Will check with labs.   lung cancer screening program... she agreed to referral   Problem List Items Addressed This Visit     Aortic atherosclerosis (HCC)   LDL goal less than 70.   Intolerant of statin.      Relevant Medications   losartan  (COZAAR ) 50 MG tablet   Controlled type 2 diabetes mellitus without complication, without long-term current use of insulin (HCC)   Chronic, significant improvement in A1c with lifestyle change and weight  management. Reevaluate in 6 months.      Relevant Medications   losartan  (COZAAR ) 50 MG tablet   Other Relevant Orders   Lipid panel   Hemoglobin A1c   Comprehensive metabolic panel with GFR   Hyperlipidemia associated with type 2 diabetes mellitus (HCC)    Chronic, continued poor control, LDL NOT at goal less than 70 given aortic atherosclerosis.   Stopped atorvastatin  10 mg p.o. daily given upset stomach.  Consider Zetia  or trial of other statin... pt does not wish yto do either today.. will recheck Chol in 3 months and reconsider then.      Relevant Medications   losartan  (COZAAR ) 50 MG tablet   Other Relevant Orders   Hemoglobin A1c   Hypertension associated with diabetes (HCC)   Chronic, inadequate control  At last OV felt possible SE of stomach upset with thaking both statin and losartan  Restarted losartan  but at a lower dose 25 mg daily.   She is tolerating it well and is willing to increase to 50 mg daily.  She will follow blood pressure at home and call if it is not at goal less than 140/90.      Relevant Medications   losartan  (COZAAR ) 50 MG tablet   Iron  deficiency anemia following bariatric surgery    Chronic, mild anemia but I ferritin still low.. Followed by Dr. Geraldene Kleine Heme.  Plan iron  infusion.      Microalbuminuria    ON ACEI... working on BP control.  Good candidate for SGLT2i but pt would rather work on diet for DM control.      Mild intermittent asthma   Chronic, stable control  Using albuterol  prn. No recent flares      Statin intolerance    Atorvastatin  low dose cause stomach upset.      Tobacco abuse disorder   Discussed in detail smoking cessation techniques.  patient pre-contemplative.      Relevant Orders   Ambulatory Referral Lung Cancer Screening [facility]   Other Visit Diagnoses       Routine general medical examination at a health care facility    -  Primary         Herby Lolling, MD

## 2024-01-20 NOTE — Assessment & Plan Note (Signed)
 LDL goal less than 70.   Intolerant of statin.

## 2024-01-20 NOTE — Assessment & Plan Note (Addendum)
 Chronic, inadequate control  At last OV felt possible SE of stomach upset with thaking both statin and losartan  Restarted losartan  but at a lower dose 25 mg daily.   She is tolerating it well and is willing to increase to 50 mg daily.  She will follow blood pressure at home and call if it is not at goal less than 140/90.

## 2024-01-20 NOTE — Assessment & Plan Note (Signed)
 Chronic, significant improvement in A1c with lifestyle change and weight management. Reevaluate in 6 months.

## 2024-01-20 NOTE — Assessment & Plan Note (Addendum)
 Chronic, mild anemia but I ferritin still low.. Followed by Dr. Geraldene Kleine Heme.  Plan iron  infusion.

## 2024-01-20 NOTE — Assessment & Plan Note (Signed)
 ON ACEI... working on BP control.  Good candidate for SGLT2i but pt would rather work on diet for DM control.

## 2024-01-20 NOTE — Assessment & Plan Note (Addendum)
  Chronic, continued poor control, LDL NOT at goal less than 70 given aortic atherosclerosis.   Stopped atorvastatin  10 mg p.o. daily given upset stomach.  Consider Zetia  or trial of other statin... pt does not wish yto do either today.. will recheck Chol in 3 months and reconsider then.

## 2024-01-20 NOTE — Assessment & Plan Note (Signed)
Chronic, stable control  Using albuterol prn. No recent flares

## 2024-01-20 NOTE — Assessment & Plan Note (Signed)
Discussed in detail smoking cessation techniques.  patient pre-contemplative.

## 2024-01-21 ENCOUNTER — Encounter: Payer: Self-pay | Admitting: Internal Medicine

## 2024-01-21 ENCOUNTER — Inpatient Hospital Stay: Payer: 59

## 2024-01-21 ENCOUNTER — Inpatient Hospital Stay (HOSPITAL_BASED_OUTPATIENT_CLINIC_OR_DEPARTMENT_OTHER): Payer: 59 | Admitting: Internal Medicine

## 2024-01-21 DIAGNOSIS — E611 Iron deficiency: Secondary | ICD-10-CM | POA: Diagnosis not present

## 2024-01-21 DIAGNOSIS — D508 Other iron deficiency anemias: Secondary | ICD-10-CM | POA: Diagnosis not present

## 2024-01-21 NOTE — Progress Notes (Signed)
 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    #Secondary erythrocytosis  cigarette smoking and sleep apnea. [ elevated EPO, JAK2, V617F, exons 12-15, CALR, and MPL were negative. Carbon monoxide 11.8% (elevated).].    HISTORY OF PRESENTING ILLNESS: Ambulating independently.  Alone.   Brenda Hawkins 58 y.o.  female patient with a history of gastric bypass; iron  deficiency is here for follow-up.  Fatigued all the time.Denies dizziness.Dyspnea but that is her norm. Appetite is good. Not on Iron /b12 supplementation.   Review of Systems  Constitutional:  Positive for malaise/fatigue. Negative for chills, diaphoresis, fever and weight loss.  HENT:  Negative for nosebleeds and sore throat.   Eyes:  Negative for double vision.  Respiratory:  Negative for cough, hemoptysis, sputum production, shortness of breath and wheezing.   Cardiovascular:  Negative for chest pain, palpitations, orthopnea and leg swelling.  Gastrointestinal:  Negative for abdominal pain, blood in stool, constipation, diarrhea, heartburn, melena, nausea and vomiting.  Genitourinary:  Negative for dysuria, frequency and urgency.  Musculoskeletal:  Negative for back pain and joint pain.  Skin: Negative.  Negative for itching and rash.  Neurological:  Negative for dizziness, tingling, focal weakness, weakness and headaches.  Endo/Heme/Allergies:  Does not bruise/bleed easily.  Psychiatric/Behavioral:  Negative for depression. The patient is not nervous/anxious and does not have insomnia.      MEDICAL HISTORY:  Past Medical History:  Diagnosis Date   Anemia    in past   Anxiety     no longer   Asthma    seasonal   Diabetes mellitus type II    no longer   GERD (gastroesophageal reflux disease)    no longer   Kidney stone    Obesity    Osteopenia    Sleep apnea    no longer   Symptomatic cholelithiasis 02/05/2019   Urachal cyst 05/2000   Vitamin B12 deficiency    since bariatric surgery    SURGICAL HISTORY: Past Surgical History:  Procedure Laterality Date   CHOLECYSTECTOMY N/A 02/17/2019   Procedure: LAPAROSCOPIC CHOLECYSTECTOMY - DIABETIC;  Surgeon: Franki Isles, MD;  Location: [facility];  Service: General;  Laterality: N/A;   CYSTOSCOPY W/ URETERAL STENT PLACEMENT Left 11/11/2018   Procedure: CYSTOSCOPY WITH STENT REMOVAL;  Surgeon: Geraline Knapp, MD;  Location: [facility];  Service: Urology;  Laterality: Left;   CYSTOSCOPY WITH HOLMIUM LASER LITHOTRIPSY Left 10/24/2018   Procedure: CYSTOSCOPY WITH HOLMIUM LASER LITHOTRIPSy;  Surgeon: Samson Croak, MD;  Location: [facility];  Service: Urology;  Laterality: Left;   CYSTOSCOPY WITH STENT PLACEMENT Bilateral 10/24/2018   Procedure: CYSTOSCOPY WITH STENT PLACEMENT;  Surgeon: Samson Croak, MD;  Location: [facility];  Service: Urology;  Laterality: Bilateral;   CYSTOSCOPY/URETEROSCOPY/HOLMIUM LASER/STENT PLACEMENT Right 11/11/2018   Procedure: CYSTOSCOPY/URETEROSCOPY/HOLMIUM LASER/STENT EXCHANGE;  Surgeon: Geraline Knapp, MD;  Location: [facility];  Service: Urology;  Laterality: Right;   ESOPHAGOGASTRODUODENOSCOPY (EGD) WITH PROPOFOL  N/A 06/01/2018   Procedure: ESOPHAGOGASTRODUODENOSCOPY (EGD) WITH PROPOFOL ;  Surgeon: Selena Daily, MD;  Location: [facility];  Service: Gastroenterology;  Laterality: N/A;   GALLBLADDER SURGERY  02/17/2019   GASTRIC BYPASS  03/17/02   REDUCTION  MAMMAPLASTY Right 1992   URETEROSCOPY Left 10/24/2018   Procedure: URETEROSCOPY;  Surgeon: Samson Croak, MD;  Location: [facility];  Service: Urology;  Laterality: Left;    SOCIAL HISTORY: Social History    Socioeconomic History   Marital status: Married    Spouse name: Not on file   Number of children: 1   Years of education: Not on file   Highest education level: Not on file  Occupational History   Occupation: homemaker  Tobacco Use   Smoking status: Every Day    Current packs/day: 1.50    Average packs/day: 1.5 packs/day for 37.0 years (55.5 ttl pk-yrs)    Types: Cigarettes   Smokeless tobacco: Never  Vaping Use   Vaping status: Never Used  Substance and Sexual Activity   Alcohol use: No   Drug use: No   Sexual activity: Yes    Birth control/protection: None, Post-menopausal  Other Topics Concern   Not on file  Social History Narrative   Sporadic with exercise   Social Drivers of Health   Financial Resource Strain: Not on file  Food Insecurity: Not on file  Transportation Needs: Not on file  Physical Activity: Not on file  Stress: Not on file  Social Connections: Not on file  Intimate Partner Violence: Not on file    FAMILY HISTORY: Family History  Problem Relation Age of Onset   Heart disease Mother        valve replacement surgery   Coronary artery disease Father    Breast cancer Neg Hx     ALLERGIES:  is allergic to codeine sulfate, penicillins, adhesive [tape], and latex.  MEDICATIONS:  Current Outpatient Medications  Medication Sig Dispense Refill   albuterol  (VENTOLIN  HFA) 108 (90 Base) MCG/ACT inhaler Inhale 2 puffs into the lungs every 6 (six) hours as needed for wheezing or shortness of breath. 6.7 g 0   esomeprazole  (NEXIUM ) 40 MG capsule Take 1 capsule (40 mg total) by mouth 2 (two) times daily before a meal. 60 capsule 11   losartan  (COZAAR ) 50 MG tablet Take 1 tablet (50 mg total) by mouth daily. 30 tablet 11   nystatin ointment (MYCOSTATIN) Apply 1 Application topically 2 (two) times daily. 30 g 0   No current facility-administered medications for this visit.   Facility-Administered Medications Ordered in Other Visits  Medication Dose Route  Frequency Provider Last Rate Last Admin   cyanocobalamin  (VITAMIN B12) injection 1,000 mcg  1,000 mcg Intramuscular Once Santosha Jividen R, MD       sodium chloride  0.9 % injection 10 mL  10 mL Intracatheter PRN Shellie Dials, MD          .  PHYSICAL EXAMINATION:  Vitals:   01/21/24 1332 01/21/24 1403  BP: (!) 170/82 130/89  Pulse: 83   Resp: 16   Temp: 99.5 F (37.5 C)   SpO2: 100%     Filed Weights   01/21/24 1332  Weight: 136 lb (61.7 kg)     Physical Exam Vitals and nursing note reviewed.  HENT:     Head: Normocephalic and atraumatic.     Mouth/Throat:     Pharynx: Oropharynx is clear.  Eyes:     Extraocular Movements: Extraocular movements intact.     Pupils: Pupils are equal, round, and reactive to light.  Cardiovascular:     Rate and Rhythm: Normal rate and regular rhythm.  Pulmonary:     Comments: Decreased breath sounds bilaterally.  Abdominal:  Palpations: Abdomen is soft.  Musculoskeletal:        General: Normal range of motion.     Cervical back: Normal range of motion.  Skin:    General: Skin is warm.  Neurological:     General: No focal deficit present.     Mental Status: She is alert and oriented to person, place, and time.  Psychiatric:        Behavior: Behavior normal.        Judgment: Judgment normal.      LABORATORY DATA:  I have reviewed the data as listed Lab Results  Component Value Date   WBC 5.2 01/19/2024   HGB 11.6 (L) 01/19/2024   HCT 37.1 01/19/2024   MCV 94.4 01/19/2024   PLT 277 01/19/2024   Recent Labs    01/13/24 0905 01/19/24 0837  NA 141 141  K 4.0 3.7  CL 107 109  CO2 23 21*  GLUCOSE 81 89  BUN 21 23*  CREATININE 0.65 0.71  CALCIUM  9.1 8.5*  GFRNONAA  --  >60  PROT 7.8 7.2  ALBUMIN 4.4 3.7  AST 18 19  ALT 12 17  ALKPHOS 66 60  BILITOT 0.2 0.3    RADIOGRAPHIC STUDIES: I have personally reviewed the radiological images as listed and agreed with the findings in the report. No results  found.  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   # Hemoglobin 11.6- ferritin is 5.  Patient asymptomatic. Pt reluctant with infusions- will HOLD off      #  B12 deficiency- secondary to malabsorption. Currently OFF injection-  MAY 2025- -B12 levels   248-  recommend B12 SL-OTC.    # DISPOSITION: # No venofer / B12 today- # RTC in 6 months- MD; labs- 1 -2 days prior-  for labs [cbc, cmp, ferritin, iron  studies, folate, b12],  venofer  +/- b12- Dr.B    All questions were answered. The patient knows to call the clinic with any problems, questions or concerns.    Gwyn Leos, MD 01/21/2024 2:11 PM

## 2024-01-21 NOTE — Progress Notes (Signed)
 Fatigued all the time.Denies dizziness.Dyspnea but that is her norm. Appetite is good. Keeps headaches.

## 2024-01-21 NOTE — Assessment & Plan Note (Addendum)
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   # Hemoglobin 11.6- ferritin is 5.  Patient asymptomatic. Pt reluctant with infusions- will HOLD off      #  B12 deficiency- secondary to malabsorption. Currently OFF injection-  MAY 2025- -B12 levels   248-  recommend B12 SL-OTC.    # DISPOSITION: # No venofer / B12 today- # RTC in 6 months- MD; labs- 1 -2 days prior-  for labs [cbc, cmp, ferritin, iron  studies, folate, b12],  venofer  +/- b12- Dr.B

## 2024-02-03 ENCOUNTER — Other Ambulatory Visit: Payer: Self-pay

## 2024-02-03 DIAGNOSIS — Z87891 Personal history of nicotine dependence: Secondary | ICD-10-CM

## 2024-02-03 DIAGNOSIS — Z122 Encounter for screening for malignant neoplasm of respiratory organs: Secondary | ICD-10-CM

## 2024-02-03 DIAGNOSIS — F17210 Nicotine dependence, cigarettes, uncomplicated: Secondary | ICD-10-CM

## 2024-03-10 ENCOUNTER — Encounter: Payer: Self-pay | Admitting: Internal Medicine

## 2024-03-10 ENCOUNTER — Ambulatory Visit: Payer: Self-pay

## 2024-03-10 ENCOUNTER — Ambulatory Visit: Admitting: Internal Medicine

## 2024-03-10 VITALS — BP 160/88 | HR 86 | Temp 98.9°F | Ht 60.25 in

## 2024-03-10 DIAGNOSIS — S83104A Unspecified dislocation of right knee, initial encounter: Secondary | ICD-10-CM | POA: Diagnosis not present

## 2024-03-10 NOTE — Progress Notes (Signed)
 Subjective:    Patient ID: Brenda Hawkins, female    DOB: [birth year], 58 y.o.   MRN: [id number]  HPI Here after a fall and right knee/leg pain With husband  Stepped out of shower onto mat---missed the mat and slipped on tiles--this morning Right leg slid out to the side--only from the knee Stayed on floor--can't put weight on leg Took 15 minutes to actually move leg out of the abnormal bend of calf Slid out of bathroom to get phone--called husband to help her get up  Now with pain along the entire thigh and down calf--muscles Excruciating pain if she moves her right knee  BC after--helped a little  Current Outpatient Medications on File Prior to Visit  Medication Sig Dispense Refill   albuterol  (VENTOLIN  HFA) 108 (90 Base) MCG/ACT inhaler Inhale 2 puffs into the lungs every 6 (six) hours as needed for wheezing or shortness of breath. 6.7 g 0   esomeprazole  (NEXIUM ) 40 MG capsule Take 1 capsule (40 mg total) by mouth 2 (two) times daily before a meal. 60 capsule 11   losartan  (COZAAR ) 50 MG tablet Take 1 tablet (50 mg total) by mouth daily. 30 tablet 11   nystatin  ointment (MYCOSTATIN ) Apply 1 Application topically 2 (two) times daily. 30 g 0   Current Facility-Administered Medications on File Prior to Visit  Medication Dose Route Frequency Provider Last Rate Last Admin   cyanocobalamin  (VITAMIN B12) injection 1,000 mcg  1,000 mcg Intramuscular Once Brahmanday, Govinda R, MD       sodium chloride  0.9 % injection 10 mL  10 mL Intracatheter PRN Finnegan, Timothy J, MD        Allergies  Allergen Reactions   Codeine Sulfate Other (See Comments)    Reaction: violently ill   Penicillins Other (See Comments)    Did it involve swelling of the face/tongue/throat, SOB, or low BP? Unknown Did it involve sudden or severe rash/hives, skin peeling, or any reaction on the inside of your mouth or nose? Unknown Did you need to seek medical attention at a hospital or doctor's office?  Unknown When did it last happen?      Childhood allergy If all above answers are "NO", may proceed with cephalosporin use.    Adhesive [Tape] Itching and Rash    PAPER TAPE IS FINE   Latex Itching and Rash    Past Medical History:  Diagnosis Date   Anemia    in past   Anxiety    no longer   Asthma    seasonal   Diabetes mellitus type II    no longer   GERD (gastroesophageal reflux disease)    no longer   Kidney stone    Obesity    Osteopenia    Sleep apnea    no longer   Symptomatic cholelithiasis 02/05/2019   Urachal cyst 05/2000   Vitamin B12 deficiency    since bariatric surgery    Past Surgical History:  Procedure Laterality Date   CHOLECYSTECTOMY N/A 02/17/2019   Procedure: LAPAROSCOPIC CHOLECYSTECTOMY - DIABETIC;  Surgeon: Nicholaus Selinda Birmingham, MD;  Location: [facility];  Service: General;  Laterality: N/A;   CYSTOSCOPY W/ URETERAL STENT PLACEMENT Left 11/11/2018   Procedure: CYSTOSCOPY WITH STENT REMOVAL;  Surgeon: Twylla Glendia BROCKS, MD;  Location: [facility];  Service: Urology;  Laterality: Left;   CYSTOSCOPY WITH HOLMIUM LASER LITHOTRIPSY Left 10/24/2018   Procedure: CYSTOSCOPY WITH HOLMIUM LASER LITHOTRIPSy;  Surgeon: Carolee Sherwood JONETTA DOUGLAS, MD;  Location: [facility];  Service: Urology;  Laterality: Left;   CYSTOSCOPY WITH STENT PLACEMENT Bilateral 10/24/2018   Procedure: CYSTOSCOPY WITH STENT PLACEMENT;  Surgeon: Carolee Sherwood JONETTA DOUGLAS, MD;  Location: [facility];  Service: Urology;  Laterality: Bilateral;   CYSTOSCOPY/URETEROSCOPY/HOLMIUM LASER/STENT PLACEMENT Right 11/11/2018   Procedure: CYSTOSCOPY/URETEROSCOPY/HOLMIUM LASER/STENT EXCHANGE;  Surgeon: Twylla Glendia BROCKS, MD;  Location: [facility];  Service: Urology;  Laterality: Right;   ESOPHAGOGASTRODUODENOSCOPY (EGD) WITH PROPOFOL  N/A 06/01/2018   Procedure: ESOPHAGOGASTRODUODENOSCOPY (EGD) WITH PROPOFOL ;  Surgeon: Unk Corinn Skiff, MD;  Location: [facility];  Service: Gastroenterology;  Laterality: N/A;   GALLBLADDER SURGERY   02/17/2019   GASTRIC BYPASS  03/17/02   REDUCTION MAMMAPLASTY Right 1992   URETEROSCOPY Left 10/24/2018   Procedure: URETEROSCOPY;  Surgeon: Carolee Sherwood JONETTA DOUGLAS, MD;  Location: [facility];  Service: Urology;  Laterality: Left;    Family History  Problem Relation Age of Onset   Heart disease Mother        valve replacement surgery   Coronary artery disease Father    Breast cancer Neg Hx     Social History   Socioeconomic History   Marital status: Married    Spouse name: Not on file   Number of children: 1   Years of education: Not on file   Highest education level: Not on file  Occupational History   Occupation: homemaker  Tobacco Use   Smoking status: Every Day    Current packs/day: 1.50    Average packs/day: 1.5 packs/day for 37.0 years (55.5 ttl pk-yrs)    Types: Cigarettes   Smokeless tobacco: Never  Vaping Use   Vaping status: Never Used  Substance and Sexual Activity   Alcohol use: No   Drug use: No   Sexual activity: Yes    Birth control/protection: None, Post-menopausal  Other Topics Concern   Not on file  Social History Narrative   Sporadic with exercise   Social Drivers of Health   Financial Resource Strain: Not on file  Food Insecurity: Not on file  Transportation Needs: Not on file  Physical Activity: Not on file  Stress: Not on file  Social Connections: Not on file  Intimate Partner Violence: Not on file   Review of Systems     Objective:   Physical Exam Constitutional:      Appearance: Normal appearance.  Musculoskeletal:     Comments: Obvious dislocation of right knee--with lateral dislocation (valgus) deformation Severe pain with any attempts at extension--though can flex some ROM in hip seems to be okay  Neurological:     Mental Status: She is alert.            Assessment & Plan:

## 2024-03-10 NOTE — Telephone Encounter (Signed)
Will assess at visit today

## 2024-03-10 NOTE — Telephone Encounter (Signed)
 FYI Only or Action Required?: FYI only for provider.  Patient was last seen in primary care on 01/20/2024 by Avelina Greig BRAVO, MD. Called Nurse Triage reporting Fall. Symptoms began today. Interventions attempted: Nothing. Symptoms are: gradually worsening.  Triage Disposition: Call EMS 911 Now  Patient/caregiver understands and will follow disposition?: No, refuses disposition  ** See note below**           Copied from CRM [id number]. Topic: Clinical - Red Word Triage >> Mar 10, 2024 10:07 AM Viola F wrote: Red Word that prompted transfer to Nurse Triage: patient fell getting out of the shower and hurt her right knee Reason for Disposition  [1] Can't stand (bear weight) or walk AND [2] new-onset after fall  Answer Assessment - Initial Assessment Questions 1. MECHANISM: How did the fall happen?      She stated she stepped out of the shower and her dog was in the way, she slipped on the wet tile.      3. ONSET: When did the fall happen? (e.g., minutes, hours, or days ago)     X 2 hours ago   4. LOCATION: What part of the body hit the ground? (e.g., back, buttocks, head, hips, knees, hands, head, stomach)     Right knee   5. INJURY: Did you hurt (injure) yourself when you fell? If Yes, ask: What did you injure? Tell me more about this? (e.g., body area; type of injury; pain severity)     Right knee   6. PAIN: Is there any pain? If Yes, ask: How bad is the pain? (e.g., Scale 1-10; or mild,  moderate, severe)   - NONE (0): No pain   - MILD (1-3): Doesn't interfere with normal activities    - MODERATE (4-7): Interferes with normal activities or awakens from sleep    - SEVERE (8-10): Excruciating pain, unable to do any normal activities       When not moving 0/10, with movement 6/10, cannot bear weight on right leg.    7. SIZE: For cuts, bruises, or swelling, ask: How large is it? (e.g., inches or centimeters)      Mild swelling on the right side of knee,  knee cap has shifted.   9. OTHER SYMPTOMS: Do you have any other symptoms? (e.g., dizziness, fever, weakness; new onset or worsening).      No   10. CAUSE: What do you think caused the fall (or falling)? (e.g., tripped, dizzy spell)       Tripped.   Pt. Refused UC/ED disposition as they would have X-ray capabilities. She requested an appt. In office instead, she is scheduled for 7/2.  Protocols used: Falls and [facility]

## 2024-03-10 NOTE — Assessment & Plan Note (Signed)
 Discussed that this needs emergency ortho evaluation Asked her to go to urgent care at [facility] in [place]

## 2024-03-11 LAB — HEMOGLOBIN A1C: Hemoglobin A1C: 5.5

## 2024-03-15 ENCOUNTER — Telehealth: Payer: Self-pay | Admitting: *Deleted

## 2024-03-15 NOTE — Telephone Encounter (Signed)
 Spoke with Brenda Hawkins and advised to continue her Losartan  50 mg daily as prescribed.  Patient states understanding.

## 2024-03-15 NOTE — Telephone Encounter (Signed)
 Copied from CRM [id number]. Topic: General - Other >> Mar 15, 2024  4:11 PM Rosina BIRCH wrote: Reason for CRM: Amy from [facility] called stating the patient was just released from the hospital and Amy would like to know if MD Avelina would follow the patient for physical and occupational therapy orders CB [phone]

## 2024-03-15 NOTE — Telephone Encounter (Signed)
 Copied from CRM [id number]. Topic: Clinical - Medical Advice >> Mar 15, 2024 10:46 AM Brenda Hawkins wrote: Reason for CRM: Patient just got back rom the hospital and she has a question about her blood pressure medication. She wants to know if its ok to start taking her blood pressure medication again after breaking her tibula, and crushing her femur. Patient can be reached at [phone].

## 2024-03-16 NOTE — Telephone Encounter (Signed)
 Agreed -

## 2024-03-16 NOTE — Telephone Encounter (Signed)
 Left message for Brenda Hawkins that Dr. Avelina will follow Brenda Hawkins for PT/OT orders.

## 2024-03-29 ENCOUNTER — Ambulatory Visit
Admission: RE | Admit: 2024-03-29 | Discharge: 2024-03-29 | Disposition: A | Discharge status: Home / Self Care | Source: Ambulatory Visit | Attending: Acute Care | Admitting: Acute Care

## 2024-03-29 DIAGNOSIS — Z122 Encounter for screening for malignant neoplasm of respiratory organs: Secondary | ICD-10-CM | POA: Insufficient documentation

## 2024-03-29 DIAGNOSIS — F17210 Nicotine dependence, cigarettes, uncomplicated: Secondary | ICD-10-CM | POA: Insufficient documentation

## 2024-03-29 DIAGNOSIS — Z87891 Personal history of nicotine dependence: Secondary | ICD-10-CM | POA: Diagnosis present

## 2024-04-06 ENCOUNTER — Other Ambulatory Visit: Payer: Self-pay

## 2024-04-06 ENCOUNTER — Telehealth: Payer: Self-pay

## 2024-04-06 DIAGNOSIS — Z87891 Personal history of nicotine dependence: Secondary | ICD-10-CM

## 2024-04-06 DIAGNOSIS — F17210 Nicotine dependence, cigarettes, uncomplicated: Secondary | ICD-10-CM

## 2024-04-06 DIAGNOSIS — Z122 Encounter for screening for malignant neoplasm of respiratory organs: Secondary | ICD-10-CM

## 2024-04-06 DIAGNOSIS — R911 Solitary pulmonary nodule: Secondary | ICD-10-CM

## 2024-04-06 NOTE — Telephone Encounter (Signed)
 Spoke with patient and reviewed results. She is in agreement to complete a 6 month follow up scan in January. Advises she recently broke her leg and the first scan was very difficult. She will wear a splint for 3 months and rehab for 6 months. Advised we will follow up with her in January for an update on her rehab and discuss scheduling follow up scan. 6 month order placed. Reviewed kidney stone, coronary artery calcifications and chronic airway inflammation. Pt has no additional concerns. Results and plan to PCP.   IMPRESSION: 1. Lung-RADS 3, probably benign findings. Short-term follow-up in 6 months is recommended with repeat low-dose chest CT without contrast (please use the following order, CT CHEST LCS NODULE FOLLOW-UP W/O CM). 2. Extensive multi-vessel coronary artery calcification. 3. Stable bronchial wall thickening in keeping with chronic airway inflammation. 4. Nonobstructing 3 mm calculus within the visualized upper pole the left kidney. 5. Surgical changes of gastric bypass and cholecystectomy.

## 2024-04-14 ENCOUNTER — Other Ambulatory Visit

## 2024-04-21 ENCOUNTER — Telehealth: Admitting: Family Medicine

## 2024-04-21 ENCOUNTER — Encounter: Payer: Self-pay | Admitting: Family Medicine

## 2024-04-21 ENCOUNTER — Ambulatory Visit: Admitting: Family Medicine

## 2024-04-21 VITALS — BP 130/70 | Ht 60.0 in

## 2024-04-21 DIAGNOSIS — E1169 Type 2 diabetes mellitus with other specified complication: Secondary | ICD-10-CM

## 2024-04-21 DIAGNOSIS — I700 Atherosclerosis of aorta: Secondary | ICD-10-CM | POA: Diagnosis not present

## 2024-04-21 DIAGNOSIS — E1159 Type 2 diabetes mellitus with other circulatory complications: Secondary | ICD-10-CM | POA: Diagnosis not present

## 2024-04-21 DIAGNOSIS — I2510 Atherosclerotic heart disease of native coronary artery without angina pectoris: Secondary | ICD-10-CM | POA: Diagnosis not present

## 2024-04-21 DIAGNOSIS — E119 Type 2 diabetes mellitus without complications: Secondary | ICD-10-CM

## 2024-04-21 DIAGNOSIS — E785 Hyperlipidemia, unspecified: Secondary | ICD-10-CM

## 2024-04-21 DIAGNOSIS — I152 Hypertension secondary to endocrine disorders: Secondary | ICD-10-CM

## 2024-04-21 MED ORDER — ROSUVASTATIN CALCIUM 5 MG PO TABS: 5.0000 mg | ORAL_TABLET | Freq: Every day | ORAL | 11 refills | Status: AC

## 2024-04-21 NOTE — Assessment & Plan Note (Signed)
 Chronic, again noted on lung cancer screening CT. Again recommended patient risk factor modification.  She is interested now in retrial of statin.  Will try Crestor  5 mg half to 1 tablet daily to 3 times a week as tolerated.

## 2024-04-21 NOTE — Assessment & Plan Note (Signed)
 Chronic, inadequate control Coronary artery calcification noted on CT scan  Recommend again statin medication to get LDL to goal less than 70. Patient agreeable to trying different statin medication.  Stomach upset to atorvastatin  10 mg daily We will try trial of rosuvastatin  half a tablet to 1 tablet 5 mg daily to 3 times a week as tolerated.  Reevaluate in 6 months (patient feels she would be unable to get urine for lab reevaluation in 3 months given leg injury)

## 2024-04-21 NOTE — Assessment & Plan Note (Signed)
 Chronic Unable to check blood pressure at home  Losartan  50 mg p.o. daily

## 2024-04-21 NOTE — Assessment & Plan Note (Signed)
 Chronic, significant improvement in control with diet changes.  She states she has not been able to eat as well lately given she is unable to cook.  Encouraged her to restart physical activity and low-carb heart healthy diet as soon as able.  Reevaluate A1c in 6 months.  Microalbumin up-to-date. Set up yearly eye exam for diabetes and have the opthalmologist send us  a copy of the evaluation for the chart.

## 2024-04-21 NOTE — Progress Notes (Signed)
 VIRTUAL VISIT A virtual visit is felt to be most appropriate for this patient at this time.   I connected with the patient on 04/21/24 at 10:40 AM EDT by virtual telehealth platform and verified that I am speaking with the correct person using two identifiers.   I discussed the limitations, risks, security and privacy concerns of performing an evaluation and management service by  virtual telehealth platform and the availability of in person appointments. I also discussed with the patient that there may be a patient responsible charge related to this service. The patient expressed understanding and agreed to proceed.  Patient location: Home Provider Location: [name] Participants: Brenda Hawkins and Brenda Hawkins   Chief Complaint  Patient presents with   Follow-up    Diabetes    History of Present Illness:  58 y.o. female patient of Brenda Passe E, MD presents with  diabetes.   Recent fall... slipped on wet floor.  Broke right tibia... has 2 months of non weight bearing .  Has OT coming.   No palpitation, no dizziness.  Diabetes: Diet controlled, she has been eating low carb diet. Lab Results  Component Value Date   HGBA1C 5.5 03/11/2024  A1c from outside source March 11, 2024 5.8 Using medications without difficulties: Hypoglycemic episodes:  Hyperglycemic episodes:  Feet problems: no ulcer Blood Sugars averaging: none, eye exam within last year:  No recent repeat lipid panel. At last office visit Jan 13, 2024 LDL 183, far from goal less than 100 given DM Dx.  At that time she refused statin medication and was not interested in trial of Zetia.  COVID 19 screen No recent travel or known exposure to COVID19 The patient denies respiratory symptoms of COVID 19 at this time.  The importance of social distancing was discussed today.   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 She has a 55.5 pack-year smoking history. She has never used smokeless tobacco. She reports that she does not drink alcohol and does not use drugs.   Current Outpatient Medications:    albuterol  (VENTOLIN  HFA) 108 (90 Base) MCG/ACT inhaler, Inhale 2 puffs into the lungs every 6 (six) hours as needed for wheezing or shortness of breath., Disp: 6.7 g, Rfl: 0   apixaban (ELIQUIS) 2.5 MG TABS tablet, Take 2.5 mg by mouth 2 (two) times daily., Disp: , Rfl:    esomeprazole  (NEXIUM ) 40 MG capsule, Take 1 capsule (40 mg total) by mouth 2 (two) times daily before a meal., Disp: 60 capsule, Rfl: 11   losartan  (COZAAR ) 50 MG tablet, Take 1 tablet (50 mg total) by mouth daily., Disp: 30 tablet, Rfl: 11   nystatin  ointment (MYCOSTATIN ), Apply 1 Application topically 2 (two) times daily., Disp: 30 g, Rfl: 0   rosuvastatin  (CRESTOR ) 5 MG tablet, Take 1 tablet (5 mg total) by mouth daily., Disp: 30 tablet, Rfl: 11 No current facility-administered medications for  this visit.  Facility-Administered Medications Ordered in Other Visits:    cyanocobalamin  (VITAMIN B12) injection 1,000 mcg, 1,000 mcg, Intramuscular, Once, Brenda, Govinda R, MD   sodium chloride  0.9 % injection 10 mL, 10 mL, Intracatheter, PRN, Brenda, Timothy J, MD   Observations/Objective: Blood pressure 130/70, height 5' (1.524 m).  Physical Exam Constitutional:      General: The patient is not in acute distress. Pulmonary:     Effort: Pulmonary effort is normal. No respiratory distress.  Neurological:     Mental Status: The patient is alert and oriented to person, place, and time.  Psychiatric:        Mood and  Affect: Mood normal.        Behavior: Behavior normal.    Assessment and Plan Hyperlipidemia associated with type 2 diabetes mellitus (HCC) Assessment & Plan: Chronic, inadequate control Coronary artery calcification noted on CT scan  Recommend again statin medication to get LDL to goal less than 70. Patient agreeable to trying different statin medication.  Stomach upset to atorvastatin  10 mg daily We will try trial of rosuvastatin  half a tablet to 1 tablet 5 mg daily to 3 times a week as tolerated.  Reevaluate in 6 months (patient feels she would be unable to get urine for lab reevaluation in 3 months given leg injury)   Controlled type 2 diabetes mellitus without complication, without long-term current use of insulin (HCC) Assessment & Plan: Chronic, significant improvement in control with diet changes.  She states she has not been able to eat as well lately given she is unable to cook.  Encouraged her to restart physical activity and low-carb heart healthy diet as soon as able.  Reevaluate A1c in 6 months.  Microalbumin up-to-date. Set up yearly eye exam for diabetes and have the opthalmologist send us  a copy of the evaluation for the chart.    Hypertension associated with diabetes (HCC) Assessment & Plan: Chronic Unable to check blood pressure at home  Losartan  50 mg p.o. daily   Coronary artery calcification Assessment & Plan: Chronic, again noted on lung cancer screening CT. Again recommended patient risk factor modification.  She is interested now in retrial of statin.  Will try Crestor  5 mg half to 1 tablet daily to 3 times a week as tolerated.     Aortic atherosclerosis (HCC) Assessment & Plan: Chronic, again noted on lung cancer screening CT. Again recommended patient risk factor modification.  She is interested now in retrial of statin.  Will try Crestor  5 mg half to 1 tablet daily to 3 times a week as tolerated.   Other orders -     Rosuvastatin  Calcium ;  Take 1 tablet (5 mg total) by mouth daily.  Dispense: 30 tablet; Refill: 11      I discussed the assessment and treatment plan with the patient. The patient was provided an opportunity to ask questions and all were answered. The patient agreed with the plan and demonstrated an understanding of the instructions.   The patient was advised to call back or seek an in-person evaluation if the symptoms worsen or if the condition fails to improve as anticipated.     Brenda Ring, MD

## 2024-07-20 ENCOUNTER — Other Ambulatory Visit

## 2024-07-23 ENCOUNTER — Ambulatory Visit: Admitting: Internal Medicine

## 2024-07-23 ENCOUNTER — Ambulatory Visit

## 2024-09-13 ENCOUNTER — Telehealth: Payer: Self-pay

## 2024-09-13 NOTE — Telephone Encounter (Signed)
 LVM to schedule 6 month follow up scan.

## 2024-09-20 ENCOUNTER — Inpatient Hospital Stay: Attending: Internal Medicine

## 2024-09-20 DIAGNOSIS — D508 Other iron deficiency anemias: Secondary | ICD-10-CM | POA: Diagnosis present

## 2024-09-20 DIAGNOSIS — K909 Intestinal malabsorption, unspecified: Secondary | ICD-10-CM | POA: Diagnosis present

## 2024-09-20 DIAGNOSIS — E611 Iron deficiency: Secondary | ICD-10-CM

## 2024-09-20 DIAGNOSIS — E538 Deficiency of other specified B group vitamins: Secondary | ICD-10-CM | POA: Diagnosis present

## 2024-09-20 LAB — IRON AND TIBC
Iron: 24 ug/dL — ABNORMAL LOW (ref 28–170)
Saturation Ratios: 6 % — ABNORMAL LOW (ref 10.4–31.8)
TIBC: 413 ug/dL (ref 250–450)
UIBC: 389 ug/dL

## 2024-09-20 LAB — CMP (CANCER CENTER ONLY)
ALT: 11 U/L (ref 0–44)
AST: 19 U/L (ref 15–41)
Albumin: 4.4 g/dL (ref 3.5–5.0)
Alkaline Phosphatase: 88 U/L (ref 38–126)
Anion gap: 12 (ref 5–15)
BUN: 19 mg/dL (ref 6–20)
CO2: 21 mmol/L — ABNORMAL LOW (ref 22–32)
Calcium: 9.4 mg/dL (ref 8.9–10.3)
Chloride: 106 mmol/L (ref 98–111)
Creatinine: 0.69 mg/dL (ref 0.44–1.00)
GFR, Estimated: >60 mL/min
Glucose, Bld: 93 mg/dL (ref 70–99)
Potassium: 4.6 mmol/L (ref 3.5–5.1)
Sodium: 139 mmol/L (ref 135–145)
Total Bilirubin: <0.2 mg/dL (ref 0.0–1.2)
Total Protein: 7.6 g/dL (ref 6.5–8.1)

## 2024-09-20 LAB — CBC WITH DIFFERENTIAL (CANCER CENTER ONLY)
Abs Immature Granulocytes: 0.04 K/uL (ref 0.00–0.07)
Basophils Absolute: 0 K/uL (ref 0.0–0.1)
Basophils Relative: 1 %
Eosinophils Absolute: 0.2 K/uL (ref 0.0–0.5)
Eosinophils Relative: 3 %
HCT: 34.8 % — ABNORMAL LOW (ref 36.0–46.0)
Hemoglobin: 10.3 g/dL — ABNORMAL LOW (ref 12.0–15.0)
Immature Granulocytes: 1 %
Lymphocytes Relative: 23 %
Lymphs Abs: 1.1 K/uL (ref 0.7–4.0)
MCH: 25.1 pg — ABNORMAL LOW (ref 26.0–34.0)
MCHC: 29.6 g/dL — ABNORMAL LOW (ref 30.0–36.0)
MCV: 84.7 fL (ref 80.0–100.0)
Monocytes Absolute: 0.4 K/uL (ref 0.1–1.0)
Monocytes Relative: 8 %
Neutro Abs: 2.9 K/uL (ref 1.7–7.7)
Neutrophils Relative %: 64 %
Platelet Count: 347 K/uL (ref 150–400)
RBC: 4.11 MIL/uL (ref 3.87–5.11)
RDW: 20.6 % — ABNORMAL HIGH (ref 11.5–15.5)
WBC Count: 4.6 K/uL (ref 4.0–10.5)
nRBC: 0 % (ref 0.0–0.2)

## 2024-09-20 LAB — VITAMIN B12: Vitamin B-12: 366 pg/mL (ref 180–914)

## 2024-09-20 LAB — FERRITIN: Ferritin: 9 ng/mL — ABNORMAL LOW (ref 11–307)

## 2024-09-20 LAB — FOLATE: Folate: 7 ng/mL

## 2024-09-24 ENCOUNTER — Inpatient Hospital Stay: Admitting: Internal Medicine

## 2024-09-24 ENCOUNTER — Encounter: Payer: Self-pay | Admitting: Internal Medicine

## 2024-09-24 ENCOUNTER — Inpatient Hospital Stay

## 2024-09-24 VITALS — BP 121/65 | HR 80 | Resp 16

## 2024-09-24 VITALS — BP 132/69 | HR 91 | Temp 99.3°F | Resp 16 | Ht 60.0 in | Wt 145.6 lb

## 2024-09-24 DIAGNOSIS — D508 Other iron deficiency anemias: Secondary | ICD-10-CM

## 2024-09-24 DIAGNOSIS — E611 Iron deficiency: Secondary | ICD-10-CM | POA: Diagnosis not present

## 2024-09-24 MED ORDER — IRON SUCROSE 20 MG/ML IV SOLN
200.0000 mg | Freq: Once | INTRAVENOUS | Status: AC
  Administered 2024-09-24: 200 mg via INTRAVENOUS

## 2024-09-24 MED ORDER — CYANOCOBALAMIN 1000 MCG/ML IJ SOLN
1000.0000 ug | Freq: Once | INTRAMUSCULAR | Status: AC
  Administered 2024-09-24: 1000 ug via INTRAMUSCULAR
  Filled 2024-09-24: qty 1

## 2024-09-24 NOTE — Progress Notes (Signed)
 Fatigue/weakness: YES Dyspena: NO  Light headedness: NO  Blood in stool: NO   Broke rt leg 03/13/24, [facility] getting out of the bath.

## 2024-09-24 NOTE — Progress Notes (Signed)
 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    #Secondary erythrocytosis  cigarette smoking and sleep apnea. [ elevated EPO, JAK2, V617F, exons 12-15, CALR, and MPL were negative. Carbon monoxide 11.8% (elevated).].    HISTORY OF PRESENTING ILLNESS: Ambulating independently.  Alone.   Brenda Hawkins 59 y.o.  female patient with a history of gastric bypass; iron  deficiency is here for follow-up.  Discussed the use of AI scribe software for clinical note transcription with the patient, who gave verbal consent to proceed.  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  She was last seen six months ago with a hemoglobin of 11 g/dL. Since that time, she underwent surgical repair of a leg fracture with internal fixation and is now post-operative with healed surgical sites. Recent laboratory studies show a decline in hemoglobin to 10 g/dL, ferritin of 9 ng/mL, and iron  saturation of 6%.  She has a history of poor absorption of oral iron  due to prior gastric bypass and previously did not tolerate sublingual Berrymelt iron  supplementation. She currently denies symptoms of iron  deficiency, including fatigue, hair loss, restless legs, and irritability. She feels well and does not report decreased energy. She expresses significant needle aversion due to longstanding discomfort from childhood allergy shots. Her B12 level is at the lower end of normal, but she does not report symptoms of B12 deficiency.        Review of Systems  Constitutional:  Positive for malaise/fatigue. Negative for chills, diaphoresis, fever and weight loss.  HENT:  Negative for nosebleeds and sore throat.   Eyes:  Negative for double vision.  Respiratory:  Negative for cough, hemoptysis, sputum production, shortness of breath and wheezing.   Cardiovascular:  Negative for chest pain, palpitations, orthopnea and leg swelling.  Gastrointestinal:  Negative for abdominal pain, blood in stool, constipation, diarrhea, heartburn, melena, nausea and vomiting.  Genitourinary:  Negative for dysuria, frequency and urgency.  Musculoskeletal:  Negative for back pain and joint pain.  Skin: Negative.  Negative for itching and rash.  Neurological:  Negative for dizziness, tingling, focal weakness, weakness and headaches.  Endo/Heme/Allergies:  Does not bruise/bleed easily.  Psychiatric/Behavioral:  Negative for depression. The patient is not nervous/anxious and does not have insomnia.      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   Vitamin B12 deficiency    since bariatric surgery    SURGICAL HISTORY: Past Surgical History:  Procedure Laterality Date   CHOLECYSTECTOMY N/A 02/17/2019   Procedure: LAPAROSCOPIC CHOLECYSTECTOMY - DIABETIC;  Surgeon: Nicholaus Selinda Birmingham, MD;  Location: [facility];  Service: General;  Laterality: N/A;   CYSTOSCOPY W/ URETERAL STENT PLACEMENT Left 11/11/2018   Procedure: CYSTOSCOPY WITH STENT REMOVAL;  Surgeon: Twylla Glendia BROCKS, MD;  Location: [facility];  Service: Urology;  Laterality: Left;   CYSTOSCOPY WITH HOLMIUM LASER LITHOTRIPSY Left 10/24/2018   Procedure: CYSTOSCOPY WITH HOLMIUM LASER  LITHOTRIPSy;  Surgeon: Carolee Sherwood JONETTA DOUGLAS, MD;  Location: [facility];  Service: Urology;  Laterality: Left;   CYSTOSCOPY WITH STENT PLACEMENT Bilateral 10/24/2018   Procedure: CYSTOSCOPY WITH STENT PLACEMENT;  Surgeon: Carolee Sherwood JONETTA DOUGLAS, MD;  Location: [facility];  Service: Urology;  Laterality: Bilateral;   CYSTOSCOPY/URETEROSCOPY/HOLMIUM LASER/STENT PLACEMENT Right 11/11/2018   Procedure: CYSTOSCOPY/URETEROSCOPY/HOLMIUM LASER/STENT EXCHANGE;  Surgeon: Twylla Glendia BROCKS, MD;  Location: [facility];  Service: Urology;  Laterality: Right;   ESOPHAGOGASTRODUODENOSCOPY (EGD) WITH PROPOFOL  N/A 06/01/2018   Procedure: ESOPHAGOGASTRODUODENOSCOPY (EGD) WITH PROPOFOL ;  Surgeon: Unk Corinn Skiff, MD;  Location: [facility];  Service: Gastroenterology;  Laterality: N/A;   GALLBLADDER SURGERY  02/17/2019   GASTRIC BYPASS  03/17/02   REDUCTION MAMMAPLASTY Right 1992   URETEROSCOPY Left 10/24/2018   Procedure: URETEROSCOPY;  Surgeon: Carolee Sherwood JONETTA DOUGLAS, MD;  Location: [facility];  Service: Urology;  Laterality: Left;    SOCIAL HISTORY: Social History   Socioeconomic History   Marital status: Married    Spouse name: Not on file   Number of children: 1   Years of education: Not on file   Highest education level: Not on file  Occupational History   Occupation: homemaker  Tobacco Use   Smoking status: Every Day    Current packs/day: 1.50    Average packs/day: 1.5 packs/day for 37.0 years (55.5 ttl pk-yrs)    Types: Cigarettes   Smokeless tobacco: Never  Vaping Use   Vaping status: Never Used  Substance and Sexual Activity   Alcohol use: No   Drug use: No   Sexual activity: Yes    Birth control/protection: None, Post-menopausal  Other Topics Concern   Not on file  Social History Narrative   Sporadic with exercise   Social Drivers of Health   Tobacco Use: High Risk (09/24/2024)   Patient History    Smoking Tobacco Use: Every Day    Smokeless Tobacco Use: Never    Passive Exposure: Not on file   Financial Resource Strain: Low Risk (03/13/2024)   Received from [facility]   Overall Financial Resource Strain (CARDIA)    How hard is it for you to pay for the very basics like food, housing, medical care, and heating?: Not very hard  Food Insecurity: No Food Insecurity (03/13/2024)   Received from [facility]   Epic    Within the past 12 months, you worried that your food would run out before you got the money to buy more.: Never true    Within the past 12 months, the food you bought just didn't last and you didn't have money to get more.: Never true  Transportation Needs: No Transportation Needs (03/13/2024)   Received from [facility] - Transportation    Lack of Transportation (Medical): No    Lack of Transportation (Non-Medical): No  Physical Activity: Not on file  Stress: Not on file  Social Connections: Not on file  Intimate Partner Violence: Not on file  Depression (PHQ2-9): Low Risk (09/24/2024)   Depression (PHQ2-9)    PHQ-2 Score: 0  Alcohol Screen: Not on file  Housing: Not on file  Utilities: Low Risk (03/13/2024)   Received from  [facility]   Utilities    Within the past 12 months, have you been unable to get utilities(heat, electricity) when it was really needed?: No  Health Literacy: Not on file    FAMILY HISTORY: Family History  Problem Relation Age of Onset   Heart disease Mother        valve replacement surgery   Coronary artery disease Father    Breast cancer Neg Hx     ALLERGIES:  is allergic to codeine sulfate, penicillins, adhesive [tape], and latex.  MEDICATIONS:  Current Outpatient Medications  Medication Sig Dispense Refill   albuterol  (VENTOLIN  HFA) 108 (90 Base) MCG/ACT inhaler Inhale 2 puffs into the lungs every 6 (six) hours as needed for wheezing or shortness of breath. 6.7 g 0   esomeprazole  (NEXIUM ) 40 MG capsule Take 1 capsule (40 mg total) by mouth 2 (two) times daily before a meal. 60 capsule 11   losartan  (COZAAR )  50 MG tablet Take 1 tablet (50 mg total) by mouth daily. 30 tablet 11   nystatin  ointment (MYCOSTATIN ) Apply 1 Application topically 2 (two) times daily. 30 g 0   rosuvastatin  (CRESTOR ) 5 MG tablet Take 1 tablet (5 mg total) by mouth daily. 30 tablet 11   No current facility-administered medications for this visit.   Facility-Administered Medications Ordered in Other Visits  Medication Dose Route Frequency Provider Last Rate Last Admin   cyanocobalamin  (VITAMIN B12) injection 1,000 mcg  1,000 mcg Intramuscular Once Sangeeta Youse R, MD       cyanocobalamin  (VITAMIN B12) injection 1,000 mcg  1,000 mcg Intramuscular Once Kiely Cousar R, MD       sodium chloride  0.9 % injection 10 mL  10 mL Intracatheter PRN Jacobo Evalene PARAS, MD          .  PHYSICAL EXAMINATION:  Vitals:   09/24/24 1000 09/24/24 1010  BP: (!) 153/76 132/69  Pulse: 91   Resp: 16   Temp: 99.3 F (37.4 C)   SpO2: 100%     Filed Weights   09/24/24 1000  Weight: 145 lb 9.6 oz (66 kg)     Physical Exam Vitals and nursing note reviewed.  HENT:     Head: Normocephalic and atraumatic.     Mouth/Throat:     Pharynx: Oropharynx is clear.  Eyes:     Extraocular Movements: Extraocular movements intact.     Pupils: Pupils are equal, round, and reactive to light.  Cardiovascular:     Rate and Rhythm: Normal rate and regular rhythm.  Pulmonary:     Comments: Decreased breath sounds bilaterally.  Abdominal:     Palpations: Abdomen is soft.  Musculoskeletal:        General: Normal range of motion.     Cervical back: Normal range of motion.  Skin:    General: Skin is warm.  Neurological:     General: No focal deficit present.     Mental Status: She is alert and oriented to person, place, and time.  Psychiatric:        Behavior: Behavior normal.        Judgment: Judgment normal.      LABORATORY DATA:  I have reviewed the data as listed Lab Results  Component Value Date   WBC 4.6 09/20/2024    HGB 10.3 (L) 09/20/2024   HCT 34.8 (L) 09/20/2024   MCV 84.7 09/20/2024   PLT 347 09/20/2024   Recent Labs    01/13/24 0905 01/19/24 0837 09/20/24 9146  NA 141 141 139  K 4.0 3.7 4.6  CL 107 109 106  CO2 23 21* 21*  GLUCOSE 81 89 93  BUN 21 23* 19  CREATININE 0.65 0.71 0.69  CALCIUM  9.1 8.5* 9.4  GFRNONAA  --  >60 >60  PROT 7.8 7.2 7.6  ALBUMIN 4.4 3.7 4.4  AST 18 19 19   ALT 12 17 11   ALKPHOS 66 60 88  BILITOT 0.2 0.3 <0.2    RADIOGRAPHIC STUDIES: I have personally reviewed the radiological images as listed and agreed with the findings in the report. No results found.  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   # Hemoglobin 10.3- ferritin is 6.  Patient asymptomatic. Pt reluctant with infusions/IV stick poor- proceed with venofer  today   #  B12 deficiency- secondary to malabsorption. Currently OFF injection-  MAY 2025- -B12 levels   248-  recommend B12 SL-OTC.    IV stick # DISPOSITION: #  venofer / B12 today- #  4 months- MD; labs- 1 -2 days prior-  for labs [cbc, cmp, ferritin, iron  studies, folate, b12],  venofer  +/- b12- Dr.B   All questions were answered. The patient knows to call the clinic with any problems, questions or concerns.    Cindy JONELLE Joe, MD 09/24/2024 10:41 AM

## 2024-09-24 NOTE — Patient Instructions (Signed)

## 2024-09-24 NOTE — Assessment & Plan Note (Signed)
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   # Hemoglobin 10.3- ferritin is 6.  Patient asymptomatic. Pt reluctant with infusions/IV stick poor- proceed with venofer  today   #  B12 deficiency- secondary to malabsorption. Currently OFF injection-  MAY 2025- -B12 levels   248-  recommend B12 SL-OTC.    IV stick # DISPOSITION: #  venofer / B12 today- #  4 months- MD; labs- 1 -2 days prior-  for labs [cbc, cmp, ferritin, iron  studies, folate, b12],  venofer  +/- b12- Dr.B

## 2024-09-24 NOTE — Addendum Note (Signed)
 Addended by: RENNIE CINDY SAUNDERS on: 09/24/2024 10:54 AM   Modules accepted: Orders

## 2024-09-29 ENCOUNTER — Ambulatory Visit
Admission: RE | Admit: 2024-09-29 | Discharge: 2024-09-29 | Disposition: A | Discharge status: Home / Self Care | Source: Ambulatory Visit | Attending: Family Medicine | Admitting: Family Medicine

## 2024-09-29 DIAGNOSIS — R911 Solitary pulmonary nodule: Secondary | ICD-10-CM | POA: Diagnosis present

## 2024-09-29 DIAGNOSIS — Z87891 Personal history of nicotine dependence: Secondary | ICD-10-CM | POA: Insufficient documentation

## 2024-09-29 DIAGNOSIS — F17210 Nicotine dependence, cigarettes, uncomplicated: Secondary | ICD-10-CM | POA: Insufficient documentation

## 2024-09-29 DIAGNOSIS — Z122 Encounter for screening for malignant neoplasm of respiratory organs: Secondary | ICD-10-CM | POA: Insufficient documentation

## 2024-10-05 ENCOUNTER — Other Ambulatory Visit: Payer: Self-pay | Admitting: Acute Care

## 2024-10-05 ENCOUNTER — Encounter: Payer: Self-pay | Admitting: Family Medicine

## 2024-10-05 DIAGNOSIS — J439 Emphysema, unspecified: Secondary | ICD-10-CM | POA: Insufficient documentation

## 2024-10-05 DIAGNOSIS — F17210 Nicotine dependence, cigarettes, uncomplicated: Secondary | ICD-10-CM

## 2024-10-05 DIAGNOSIS — Z122 Encounter for screening for malignant neoplasm of respiratory organs: Secondary | ICD-10-CM

## 2024-10-05 DIAGNOSIS — Z87891 Personal history of nicotine dependence: Secondary | ICD-10-CM

## 2025-01-17 ENCOUNTER — Inpatient Hospital Stay

## 2025-01-21 ENCOUNTER — Inpatient Hospital Stay

## 2025-01-21 ENCOUNTER — Inpatient Hospital Stay: Admitting: Internal Medicine
